# Patient Record
Sex: Male | Born: 1935 | Race: White | Hispanic: No | Marital: Married | Smoking: Never smoker
Health system: Southern US, Community
[De-identification: ages and names within clinical notes are randomized; demographics above are authoritative.]

## PROBLEM LIST (undated history)

## (undated) DIAGNOSIS — I712 Thoracic aortic aneurysm, without rupture: Secondary | ICD-10-CM

## (undated) DIAGNOSIS — E785 Hyperlipidemia, unspecified: Secondary | ICD-10-CM

## (undated) DIAGNOSIS — R001 Bradycardia, unspecified: Secondary | ICD-10-CM

## (undated) DIAGNOSIS — C259 Malignant neoplasm of pancreas, unspecified: Secondary | ICD-10-CM

## (undated) DIAGNOSIS — R180 Malignant ascites: Secondary | ICD-10-CM

## (undated) DIAGNOSIS — I10 Essential (primary) hypertension: Secondary | ICD-10-CM

## (undated) DIAGNOSIS — Z8489 Family history of other specified conditions: Secondary | ICD-10-CM

## (undated) DIAGNOSIS — I469 Cardiac arrest, cause unspecified: Secondary | ICD-10-CM

## (undated) DIAGNOSIS — C801 Malignant (primary) neoplasm, unspecified: Secondary | ICD-10-CM

## (undated) DIAGNOSIS — T82110A Breakdown (mechanical) of cardiac electrode, initial encounter: Secondary | ICD-10-CM

## (undated) DIAGNOSIS — Z950 Presence of cardiac pacemaker: Secondary | ICD-10-CM

## (undated) HISTORY — DX: Essential (primary) hypertension: I10

## (undated) HISTORY — DX: Malignant neoplasm of pancreas, unspecified: C25.9

## (undated) HISTORY — DX: Cardiac arrest, cause unspecified: I46.9

## (undated) HISTORY — DX: Malignant ascites: R18.0

## (undated) HISTORY — DX: Hyperlipidemia, unspecified: E78.5

## (undated) HISTORY — DX: Thoracic aortic aneurysm, without rupture: I71.2

## (undated) HISTORY — DX: Breakdown (mechanical) of cardiac electrode, initial encounter: T82.110A

## (undated) HISTORY — DX: Bradycardia, unspecified: R00.1

## (undated) MED FILL — Dexamethasone Sodium Phosphate Inj 100 MG/10ML: INTRAMUSCULAR | Qty: 1 | Status: AC

---

## 1997-03-04 HISTORY — PX: PERMANENT PACEMAKER INSERTION: SHX6023

## 2002-02-19 ENCOUNTER — Emergency Department (HOSPITAL_COMMUNITY): Admission: EM | Admit: 2002-02-19 | Discharge: 2002-02-19 | Payer: Self-pay | Admitting: *Deleted

## 2006-03-26 ENCOUNTER — Ambulatory Visit (HOSPITAL_COMMUNITY): Admission: RE | Admit: 2006-03-26 | Discharge: 2006-03-26 | Payer: Self-pay | Admitting: Neurology

## 2006-07-09 ENCOUNTER — Ambulatory Visit: Payer: Self-pay | Admitting: Gastroenterology

## 2006-07-22 ENCOUNTER — Ambulatory Visit: Payer: Self-pay | Admitting: Gastroenterology

## 2006-08-13 ENCOUNTER — Ambulatory Visit: Payer: Self-pay | Admitting: Gastroenterology

## 2006-09-10 ENCOUNTER — Ambulatory Visit (HOSPITAL_COMMUNITY): Admission: RE | Admit: 2006-09-10 | Discharge: 2006-09-10 | Payer: Self-pay | Admitting: Urology

## 2006-10-22 ENCOUNTER — Encounter: Admission: RE | Admit: 2006-10-22 | Discharge: 2007-01-20 | Payer: Self-pay | Admitting: *Deleted

## 2007-01-21 ENCOUNTER — Encounter: Admission: RE | Admit: 2007-01-21 | Discharge: 2007-02-18 | Payer: Self-pay | Admitting: *Deleted

## 2007-02-19 ENCOUNTER — Encounter: Admission: RE | Admit: 2007-02-19 | Discharge: 2007-03-15 | Payer: Self-pay | Admitting: *Deleted

## 2007-03-16 ENCOUNTER — Encounter: Admission: RE | Admit: 2007-03-16 | Discharge: 2007-06-14 | Payer: Self-pay | Admitting: *Deleted

## 2008-01-05 ENCOUNTER — Encounter
Admission: RE | Admit: 2008-01-05 | Discharge: 2008-01-29 | Payer: Self-pay | Admitting: Physical Medicine and Rehabilitation

## 2008-07-01 ENCOUNTER — Encounter: Admission: RE | Admit: 2008-07-01 | Discharge: 2008-08-09 | Payer: Self-pay | Admitting: Otolaryngology

## 2008-10-05 ENCOUNTER — Observation Stay (HOSPITAL_COMMUNITY): Admission: EM | Admit: 2008-10-05 | Discharge: 2008-10-06 | Payer: Self-pay | Admitting: Emergency Medicine

## 2008-11-22 ENCOUNTER — Ambulatory Visit (HOSPITAL_COMMUNITY): Admission: RE | Admit: 2008-11-22 | Discharge: 2008-11-22 | Payer: Self-pay | Admitting: Cardiology

## 2008-11-22 HISTORY — PX: PERMANENT PACEMAKER GENERATOR CHANGE: SHX6022

## 2008-12-11 ENCOUNTER — Ambulatory Visit: Payer: Self-pay | Admitting: Internal Medicine

## 2008-12-11 ENCOUNTER — Inpatient Hospital Stay (HOSPITAL_COMMUNITY): Admission: EM | Admit: 2008-12-11 | Discharge: 2008-12-12 | Payer: Self-pay | Admitting: Emergency Medicine

## 2010-05-01 ENCOUNTER — Ambulatory Visit (HOSPITAL_COMMUNITY): Admission: RE | Admit: 2010-05-01 | Discharge: 2010-05-01 | Payer: Self-pay | Admitting: Cardiovascular Disease

## 2010-09-13 ENCOUNTER — Encounter
Admission: RE | Admit: 2010-09-13 | Discharge: 2010-12-04 | Payer: Self-pay | Source: Home / Self Care | Attending: Otolaryngology | Admitting: Otolaryngology

## 2011-04-30 ENCOUNTER — Other Ambulatory Visit (HOSPITAL_COMMUNITY): Payer: Self-pay | Admitting: Cardiovascular Disease

## 2011-04-30 DIAGNOSIS — I712 Thoracic aortic aneurysm, without rupture: Secondary | ICD-10-CM

## 2011-04-30 NOTE — Discharge Summary (Signed)
Gary [name], Gary [name]                ACCOUNT NO.:  [id number]   MEDICAL RECORD NO.:  [id number]          PATIENT TYPE:  OBV   LOCATION:  6529                         FACILITY:  [facility]   PHYSICIAN:  Richard A. Alanda Amass, M.D.DATE OF BIRTH:  08/24/1932   DATE OF ADMISSION:  10/05/2008  DATE OF DISCHARGE:  10/06/2008                               DISCHARGE SUMMARY   DISCHARGE DIAGNOSES:  1. Near syncope, probably secondary to vasovagal from narcotic cough      syrup.  2. History of St. Jude pacemaker implant, device is at ERI per pacer      check on this admission.  3. Treated hypertension.  4. History of gentamicin-induced gait instability and hearing loss.   HOSPITAL COURSE:  The patient is a 77 year old male followed by Dr.  Alanda Amass and Dr. Arvilla Market.  He had a St. Jude pacemaker implanted in  the past for bradycardia.  He presented to [facility] ER on October 05, 2008,  with weakness, dizziness, and nausea.  The patient states he had a cold  for the previous few days and was given a cough syrup with hydrocodone.  He took his medicine and slept well initially, but then took 2 more  doses throughout the day.  After the second dose, he began to feel weak,  dizzy, and nauseated and vomited x2.  He presented to the emergency room  for further evaluation.  Chest x-ray revealed a large hiatal hernia  versus aneurysmal dilatation of the aorta and a CT of the chest was  obtained.  The patient does have a large hiatal hernia as well as  aneurysmal dilatation of the ascending aorta as well as 2 tiny nodules  in his lungs.  The patient was admitted to telemetry.  His pacemaker was  checked.  The device has reached ERI with an intrinsic underlying rhythm  of 80.  Dr. Clarene Duke feels the patient can be discharged on October 06, 2008.  He will need a followup with Dr. Alanda Amass.   LABORATORY DATA:  White count 13.8 on admission, hemoglobin 13.5,  hematocrit 40.4, platelets 134.  Sodium 138, potassium  3.9, BUN 11,  creatinine 0.98.  CK-MB and troponins are negative x3.  BNP is 182.  Urinalysis is unremarkable.  CT of the chest with contrast showed a  moderate-sized hiatal hernia and aneurysmal dilatation of the ascending  thoracic aorta.  There were 2 tiny nodules noted.  A followup CT is  recommended in 1 year.  A chest x-ray on October 05, 2008, showed a  retrocardiac opacity of undetermined etiology.  Telemetry shows sinus  rhythm 62.   DISCHARGE MEDICATIONS:  1. Aciphex 20 mg every other day.  2. Metoprolol 75 mg a day.  3. Norvasc 5 mg a day.  4. Enalapril 20 mg a day.  5. Simvastatin 20 mg at bedtime.  6. Aspirin 81 mg a day.   DISPOSITION:  The patient is discharged in stable condition and will  follow up with Dr. Alanda Amass.      Abelino Derrick, P.A.      Richard A. Alanda Amass, M.D.  Electronically Signed    LKK/MEDQ  D:  10/06/2008  T:  10/06/2008  Job:  [id number]   cc:   Gerlene Burdock A. Alanda Amass, M.D.  Donia Guiles, M.D.

## 2011-04-30 NOTE — H&P (Signed)
NAMEWILLIAMSON, [name] NO.:  [id number]   MEDICAL RECORD NO.:  [id number]          PATIENT TYPE:  INP   LOCATION:  5523                         FACILITY:  [facility]   PHYSICIAN:  Gardiner Barefoot, MD    DATE OF BIRTH:  09-04-1932   DATE OF ADMISSION:  12/10/2008  DATE OF DISCHARGE:                              HISTORY & PHYSICAL   PRIMARY CARE PHYSICIAN:  Donia Guiles, MD   CHIEF COMPLAINT:  Fall.   HISTORY OF PRESENT ILLNESS:  This is a 77 year old male with a history  of arrhythmia and a pacemaker and recent hospitalization for near  syncopal event, presents after a fall.  The patient reports he was  potting a plant and essentially does not remember the event, however,  was found had fallen back on his back few feet from where he was working  in the garden and does not remember the event.  The patient's wife does  report she had had a fall.  The patient does have some left hip pain.  The patient denies any chest pain, dizziness, shortness of breath or any  other symptoms associated prior to this.  No recent illnesses, no fever,  no recent nausea or vomiting.   PAST MEDICAL HISTORY:  1. Hypertension.  2. Arrhythmia.  3. GERD.  4. Hyperlipidemia.  5. History of hypoglycemia.   MEDICATIONS:  1. Aciphex 20 mg every other day.  2. Metoprolol 75 mg a day.  3. Norvasc 5 mg a day.  4. Enalapril 20 mg a day.  5. Simvastatin 20 mg nightly.  6. Aspirin 81 mg a day.   ALLERGIES:  GENTAMICIN and HYDROCODONE.   FAMILY HISTORY:  Noncontributory.   SOCIAL HISTORY:  Denies any history of alcohol, tobacco, or drugs.   REVIEW OF SYSTEMS:  Negative except as per the history of present  illness.   PHYSICAL EXAMINATION:  VITAL SIGNS:  Temperature is 97.6, pulse is 80,  respirations 20, blood pressure 139/83, and O2 sats 97%.  GENERAL:  The patient is awake, alert, and oriented x3, appears in no  acute distress.  CARDIOVASCULAR:  Regular rate and rhythm.  No murmurs,  rubs, or gallops.  LUNGS:  Clear to auscultation bilaterally.  ABDOMEN:  Soft, nontender, nondistended.  Positive bowel sounds.  No  hepatosplenomegaly.  EXTREMITIES:  No edema.  NEUROLOGIC:  Equal nonfocal.   LABORATORY DATA:  Negative UA, CMP with no significant abnormalities.  CBC with a white count of 11, 81% neutrophils, hemoglobin of 13.  CT  scan of the head and hip pending at this time.   ASSESSMENT AND PLAN:  1. Syncopal episode.  Etiologies could be stroke versus cardiac      etiology versus vasovagal.  CT of the head is pending at this time.      No significant concerns for stroke with the patient's recent      history might be secondary to cardiac origin.  Will discuss with      Cardiology.  2. Hypertension.  Will continue the patient's home medications.  3. Prophylaxis.  Will continue with DVT  prophylaxis.      Gardiner Barefoot, MD  Electronically Signed     RWC/MEDQ  D:  12/11/2008  T:  12/11/2008  Job:  [id number]

## 2011-05-03 NOTE — Assessment & Plan Note (Signed)
[facility]                           GASTROENTEROLOGY OFFICE NOTE   DONIE, LEMELIN                       MRN:          [id number]  DATE:08/13/2006                            DOB:          09-21-1932    CHIEF COMPLAINT:  Followup post EGD and colonoscopy.   HISTORY:  Mr. Gorrell is a very nice 77 year old male, known to Dr. Victorino Dike,  who had recently been referred for significant reflux symptoms.  He was scheduled for upper endoscopy and then also a colonoscopy for routine  screening. His upper endoscopy did show a prominent hiatal hernia, 6 cm with  an early stricture and he had some mild gastritis as well.  CLO-testing was  done and this was positive.  Colonoscopy did not reveal any polyps or other  mucosal lesions.  He had diverticulosis from the ascending colon to the  sigmoid colon mild, and internal and external hemorrhoids grade 2.  Patient  had been called in a Prevpac for his H. Pylori and has since completed that.  He is on AcipHex 20 mg daily with very good control of his reflux symptoms.   The patient has questions about H. Pylori and these were answered today.  He  also mentions that he is currently on another antibiotic and was diagnosed  with prostatitis about three days ago.  He said he was feeling very weak  over the past weekend and actually had what sounds like a vasovagal type of  event at home.  He had gone to Urgent Care, was diagnosed with prostatitis  and started on antibiotics and then saw Dr. Vonita Moss and is currently on  Cipro for 14 days, also had an antibiotic injection in his office.  He says  that he has been having some nocturnal sweats but overall is feeling better  over the past couple of days.  He was concerned because his stools were  loose during the time he was taking his Prevpac but these are back to  normal.  He has not noted any melena or hematochezia.   CURRENT MEDICATIONS:  Cipro 500 two times  daily, AcipHex 20 daily,  Simvastatin daily, Toprol 75 daily, and aspirin 81 mg daily.   PHYSICAL EXAMINATION:  VITAL SIGNS:  Weight is 170.6, blood pressure 122/70,  pulse is 64.  Not further examined today.  Discussion only.   IMPRESSION:  41. A 77 year old male with GERD, well controlled.  2. Status post treatment for H. Pylori gastritis.  3. Diverticulosis.  4. Active prostatitis on Cipro.   PLAN:  Continue AcipHex p.o. daily.  I do not feel that he needs to have any  further labs or H. Pylori antibody titer done at this time.  Will followup  with GI on a p.r.n. basis.                                   Amy Esterwood, PA-C  Ulyess Mort, MD   AE/MedQ  DD:  08/14/2006  DT:  08/15/2006  Job #:  [id number]

## 2011-05-07 ENCOUNTER — Encounter (HOSPITAL_COMMUNITY): Payer: Self-pay

## 2011-05-07 ENCOUNTER — Ambulatory Visit (HOSPITAL_COMMUNITY)
Admission: RE | Admit: 2011-05-07 | Discharge: 2011-05-07 | Disposition: A | Discharge status: Home / Self Care | Payer: Medicare Other | Source: Ambulatory Visit | Attending: Cardiovascular Disease | Admitting: Cardiovascular Disease

## 2011-05-07 DIAGNOSIS — I712 Thoracic aortic aneurysm, without rupture, unspecified: Secondary | ICD-10-CM | POA: Insufficient documentation

## 2011-05-07 HISTORY — DX: Essential (primary) hypertension: I10

## 2011-05-07 MED ORDER — IOHEXOL 300 MG/ML  SOLN: 100.0000 mL | Freq: Once | INTRAMUSCULAR | Status: AC | PRN

## 2011-05-16 HISTORY — PX: US ECHOCARDIOGRAPHY: HXRAD669

## 2011-08-02 ENCOUNTER — Encounter: Payer: Self-pay | Admitting: Gastroenterology

## 2011-09-16 LAB — URINALYSIS, ROUTINE W REFLEX MICROSCOPIC
Bilirubin Urine: NEGATIVE
Glucose, UA: NEGATIVE
Hgb urine dipstick: NEGATIVE
Ketones, ur: NEGATIVE
Nitrite: NEGATIVE
Protein, ur: NEGATIVE
Specific Gravity, Urine: 1.041 — ABNORMAL HIGH
Urobilinogen, UA: 0.2
pH: 8

## 2011-09-16 LAB — BASIC METABOLIC PANEL
BUN: 11
CO2: 25
Calcium: 8.4
Chloride: 106
Creatinine, Ser: 0.98
GFR calc Af Amer: >60
GFR calc non Af Amer: >60
Glucose, Bld: 113 — ABNORMAL HIGH
Potassium: 3.9
Sodium: 138

## 2011-09-16 LAB — LIPID PANEL
Cholesterol: 113
HDL: 36 — ABNORMAL LOW
LDL Cholesterol: 64
Total CHOL/HDL Ratio: 3.1
Triglycerides: 66
VLDL: 13

## 2011-09-16 LAB — DIFFERENTIAL
Basophils Absolute: 0
Basophils Relative: 0
Eosinophils Absolute: 0
Eosinophils Relative: 0
Lymphocytes Relative: 7 — ABNORMAL LOW
Lymphs Abs: 0.9
Monocytes Absolute: 0.7
Monocytes Relative: 5
Neutro Abs: 11.4 — ABNORMAL HIGH
Neutrophils Relative %: 88 — ABNORMAL HIGH

## 2011-09-16 LAB — CBC
HCT: 40.4
Hemoglobin: 13.5
MCHC: 33.5
MCV: 92.5
Platelets: 134 — ABNORMAL LOW
RBC: 4.36
RDW: 13.8
WBC: 13 — ABNORMAL HIGH

## 2011-09-16 LAB — POCT CARDIAC MARKERS
CKMB, poc: <1 — ABNORMAL LOW
CKMB, poc: <1 — ABNORMAL LOW
Myoglobin, poc: 57.4
Myoglobin, poc: 63.5
Troponin i, poc: <0.05
Troponin i, poc: <0.05

## 2011-09-16 LAB — TROPONIN I: Troponin I: <0.01

## 2011-09-16 LAB — CARDIAC PANEL(CRET KIN+CKTOT+MB+TROPI)
CK, MB: 1.3
Relative Index: INVALID
Total CK: 67
Troponin I: <0.01

## 2011-09-16 LAB — B-NATRIURETIC PEPTIDE (CONVERTED LAB): Pro B Natriuretic peptide (BNP): 182 — ABNORMAL HIGH

## 2011-09-16 LAB — TSH: TSH: 0.414

## 2011-09-16 LAB — CK TOTAL AND CKMB (NOT AT ARMC)
CK, MB: 1
Relative Index: INVALID
Total CK: 52

## 2011-09-16 LAB — MAGNESIUM: Magnesium: 1.8

## 2011-09-19 LAB — URINALYSIS, ROUTINE W REFLEX MICROSCOPIC
Bilirubin Urine: NEGATIVE
Glucose, UA: NEGATIVE mg/dL
Hgb urine dipstick: NEGATIVE
Ketones, ur: NEGATIVE mg/dL
Nitrite: NEGATIVE
Protein, ur: NEGATIVE mg/dL
Specific Gravity, Urine: 1.02 (ref 1.005–1.030)
Urobilinogen, UA: 1 mg/dL (ref 0.0–1.0)
pH: 7 (ref 5.0–8.0)

## 2011-09-19 LAB — CBC
HCT: 37.9 % — ABNORMAL LOW (ref 39.0–52.0)
HCT: 40.6 % (ref 39.0–52.0)
Hemoglobin: 12.8 g/dL — ABNORMAL LOW (ref 13.0–17.0)
Hemoglobin: 13.6 g/dL (ref 13.0–17.0)
MCHC: 33.4 g/dL (ref 30.0–36.0)
MCHC: 33.6 g/dL (ref 30.0–36.0)
MCV: 90.7 fL (ref 78.0–100.0)
MCV: 91.1 fL (ref 78.0–100.0)
Platelets: 145 10*3/uL — ABNORMAL LOW (ref 150–400)
Platelets: 165 10*3/uL (ref 150–400)
RBC: 4.18 MIL/uL — ABNORMAL LOW (ref 4.22–5.81)
RBC: 4.46 MIL/uL (ref 4.22–5.81)
RDW: 13.5 % (ref 11.5–15.5)
RDW: 13.7 % (ref 11.5–15.5)
WBC: 11 10*3/uL — ABNORMAL HIGH (ref 4.0–10.5)
WBC: 8 10*3/uL (ref 4.0–10.5)

## 2011-09-19 LAB — CK TOTAL AND CKMB (NOT AT ARMC)
CK, MB: 0.8 ng/mL (ref 0.3–4.0)
CK, MB: 0.9 ng/mL (ref 0.3–4.0)
CK, MB: 1 ng/mL (ref 0.3–4.0)
CK, MB: 1.1 ng/mL (ref 0.3–4.0)
Relative Index: INVALID (ref 0.0–2.5)
Relative Index: INVALID (ref 0.0–2.5)
Relative Index: INVALID (ref 0.0–2.5)
Relative Index: INVALID (ref 0.0–2.5)
Total CK: 59 U/L (ref 7–232)
Total CK: 66 U/L (ref 7–232)
Total CK: 68 U/L (ref 7–232)
Total CK: 70 U/L (ref 7–232)

## 2011-09-19 LAB — COMPREHENSIVE METABOLIC PANEL
ALT: 17 U/L (ref 0–53)
ALT: 20 U/L (ref 0–53)
AST: 16 U/L (ref 0–37)
AST: 23 U/L (ref 0–37)
Albumin: 3.1 g/dL — ABNORMAL LOW (ref 3.5–5.2)
Albumin: 3.6 g/dL (ref 3.5–5.2)
Alkaline Phosphatase: 57 U/L (ref 39–117)
Alkaline Phosphatase: 65 U/L (ref 39–117)
BUN: 16 mg/dL (ref 6–23)
BUN: 24 mg/dL — ABNORMAL HIGH (ref 6–23)
CO2: 23 mEq/L (ref 19–32)
CO2: 28 mEq/L (ref 19–32)
Calcium: 8.7 mg/dL (ref 8.4–10.5)
Calcium: 8.8 mg/dL (ref 8.4–10.5)
Chloride: 104 mEq/L (ref 96–112)
Chloride: 105 mEq/L (ref 96–112)
Creatinine, Ser: 1.05 mg/dL (ref 0.4–1.5)
Creatinine, Ser: 1.21 mg/dL (ref 0.4–1.5)
GFR calc Af Amer: >60 mL/min (ref >60)
GFR calc Af Amer: >60 mL/min (ref >60)
GFR calc non Af Amer: 59 mL/min — ABNORMAL LOW (ref >60)
GFR calc non Af Amer: >60 mL/min (ref >60)
Glucose, Bld: 92 mg/dL (ref 70–99)
Glucose, Bld: 97 mg/dL (ref 70–99)
Potassium: 3.9 mEq/L (ref 3.5–5.1)
Potassium: 4 mEq/L (ref 3.5–5.1)
Sodium: 138 mEq/L (ref 135–145)
Sodium: 139 mEq/L (ref 135–145)
Total Bilirubin: 0.8 mg/dL (ref 0.3–1.2)
Total Bilirubin: 1.4 mg/dL — ABNORMAL HIGH (ref 0.3–1.2)
Total Protein: 6.4 g/dL (ref 6.0–8.3)
Total Protein: 6.8 g/dL (ref 6.0–8.3)

## 2011-09-19 LAB — DIFFERENTIAL
Basophils Absolute: 0 10*3/uL (ref 0.0–0.1)
Basophils Relative: 0 % (ref 0–1)
Eosinophils Absolute: 0 10*3/uL (ref 0.0–0.7)
Eosinophils Relative: 0 % (ref 0–5)
Lymphocytes Relative: 11 % — ABNORMAL LOW (ref 12–46)
Lymphs Abs: 1.2 10*3/uL (ref 0.7–4.0)
Monocytes Absolute: 0.8 10*3/uL (ref 0.1–1.0)
Monocytes Relative: 8 % (ref 3–12)
Neutro Abs: 8.9 10*3/uL — ABNORMAL HIGH (ref 1.7–7.7)
Neutrophils Relative %: 81 % — ABNORMAL HIGH (ref 43–77)

## 2011-09-19 LAB — POCT I-STAT, CHEM 8
BUN: 25 mg/dL — ABNORMAL HIGH (ref 6–23)
Calcium, Ion: 1.11 mmol/L — ABNORMAL LOW (ref 1.12–1.32)
Chloride: 107 meq/L (ref 96–112)
Creatinine, Ser: 1.4 mg/dL (ref 0.4–1.5)
Glucose, Bld: 108 mg/dL — ABNORMAL HIGH (ref 70–99)
HCT: 42 % (ref 39.0–52.0)
Hemoglobin: 14.3 g/dL (ref 13.0–17.0)
Potassium: 4 meq/L (ref 3.5–5.1)
Sodium: 140 meq/L (ref 135–145)
TCO2: 23 mmol/L (ref 0–100)

## 2011-09-19 LAB — TROPONIN I
Troponin I: 0.01 ng/mL (ref 0.00–0.06)
Troponin I: <0.01 ng/mL (ref 0.00–0.06)
Troponin I: <0.01 ng/mL (ref 0.00–0.06)
Troponin I: <0.01 ng/mL (ref 0.00–0.06)

## 2012-02-06 DIAGNOSIS — E7800 Pure hypercholesterolemia, unspecified: Secondary | ICD-10-CM | POA: Diagnosis not present

## 2012-02-06 DIAGNOSIS — Z0000 Encounter for general adult medical examination without abnormal findings: Secondary | ICD-10-CM | POA: Diagnosis not present

## 2012-02-06 DIAGNOSIS — I10 Essential (primary) hypertension: Secondary | ICD-10-CM | POA: Diagnosis not present

## 2012-03-02 DIAGNOSIS — N138 Other obstructive and reflux uropathy: Secondary | ICD-10-CM | POA: Diagnosis not present

## 2012-03-02 DIAGNOSIS — R3915 Urgency of urination: Secondary | ICD-10-CM | POA: Diagnosis not present

## 2012-03-02 DIAGNOSIS — N400 Enlarged prostate without lower urinary tract symptoms: Secondary | ICD-10-CM | POA: Diagnosis not present

## 2012-03-02 DIAGNOSIS — N401 Enlarged prostate with lower urinary tract symptoms: Secondary | ICD-10-CM | POA: Diagnosis not present

## 2012-10-01 DIAGNOSIS — Z23 Encounter for immunization: Secondary | ICD-10-CM | POA: Diagnosis not present

## 2012-10-23 ENCOUNTER — Other Ambulatory Visit (HOSPITAL_COMMUNITY): Payer: Self-pay | Admitting: Cardiovascular Disease

## 2012-10-23 DIAGNOSIS — I10 Essential (primary) hypertension: Secondary | ICD-10-CM | POA: Diagnosis not present

## 2012-10-23 DIAGNOSIS — E782 Mixed hyperlipidemia: Secondary | ICD-10-CM | POA: Diagnosis not present

## 2012-10-23 DIAGNOSIS — I712 Thoracic aortic aneurysm, without rupture: Secondary | ICD-10-CM

## 2012-10-23 DIAGNOSIS — I714 Abdominal aortic aneurysm, without rupture: Secondary | ICD-10-CM

## 2012-10-23 DIAGNOSIS — R079 Chest pain, unspecified: Secondary | ICD-10-CM | POA: Diagnosis not present

## 2012-10-26 DIAGNOSIS — R5383 Other fatigue: Secondary | ICD-10-CM | POA: Diagnosis not present

## 2012-10-26 DIAGNOSIS — E782 Mixed hyperlipidemia: Secondary | ICD-10-CM | POA: Diagnosis not present

## 2012-10-26 DIAGNOSIS — Z79899 Other long term (current) drug therapy: Secondary | ICD-10-CM | POA: Diagnosis not present

## 2012-10-28 DIAGNOSIS — Z961 Presence of intraocular lens: Secondary | ICD-10-CM | POA: Diagnosis not present

## 2012-10-28 DIAGNOSIS — H5210 Myopia, unspecified eye: Secondary | ICD-10-CM | POA: Diagnosis not present

## 2012-10-29 ENCOUNTER — Ambulatory Visit (HOSPITAL_COMMUNITY)
Admission: RE | Admit: 2012-10-29 | Discharge: 2012-10-29 | Disposition: A | Discharge status: Home / Self Care | Payer: Medicare Other | Source: Ambulatory Visit | Attending: Cardiovascular Disease | Admitting: Cardiovascular Disease

## 2012-10-29 ENCOUNTER — Encounter (HOSPITAL_COMMUNITY): Payer: Self-pay

## 2012-10-29 DIAGNOSIS — K449 Diaphragmatic hernia without obstruction or gangrene: Secondary | ICD-10-CM | POA: Diagnosis not present

## 2012-10-29 DIAGNOSIS — I712 Thoracic aortic aneurysm, without rupture, unspecified: Secondary | ICD-10-CM | POA: Insufficient documentation

## 2012-10-29 DIAGNOSIS — K8020 Calculus of gallbladder without cholecystitis without obstruction: Secondary | ICD-10-CM | POA: Insufficient documentation

## 2012-10-29 HISTORY — DX: Presence of cardiac pacemaker: Z95.0

## 2012-10-29 MED ORDER — IOHEXOL 300 MG/ML  SOLN
100.0000 mL | Freq: Once | INTRAMUSCULAR | Status: AC | PRN
  Administered 2012-10-29: 100 mL via INTRAVENOUS

## 2012-12-24 ENCOUNTER — Other Ambulatory Visit (HOSPITAL_COMMUNITY): Payer: Self-pay | Admitting: Cardiovascular Disease

## 2012-12-24 DIAGNOSIS — I998 Other disorder of circulatory system: Secondary | ICD-10-CM

## 2012-12-24 DIAGNOSIS — I672 Cerebral atherosclerosis: Secondary | ICD-10-CM

## 2012-12-24 DIAGNOSIS — R55 Syncope and collapse: Secondary | ICD-10-CM

## 2012-12-24 DIAGNOSIS — R0989 Other specified symptoms and signs involving the circulatory and respiratory systems: Secondary | ICD-10-CM

## 2013-01-01 ENCOUNTER — Ambulatory Visit (HOSPITAL_COMMUNITY)
Admission: RE | Admit: 2013-01-01 | Discharge: 2013-01-01 | Disposition: A | Discharge status: Home / Self Care | Payer: Medicare Other | Source: Ambulatory Visit | Attending: Cardiovascular Disease | Admitting: Cardiovascular Disease

## 2013-01-01 DIAGNOSIS — I10 Essential (primary) hypertension: Secondary | ICD-10-CM | POA: Insufficient documentation

## 2013-01-01 DIAGNOSIS — R42 Dizziness and giddiness: Secondary | ICD-10-CM | POA: Insufficient documentation

## 2013-01-01 DIAGNOSIS — I998 Other disorder of circulatory system: Secondary | ICD-10-CM

## 2013-01-01 DIAGNOSIS — I2510 Atherosclerotic heart disease of native coronary artery without angina pectoris: Secondary | ICD-10-CM | POA: Diagnosis not present

## 2013-01-01 DIAGNOSIS — Z950 Presence of cardiac pacemaker: Secondary | ICD-10-CM | POA: Diagnosis not present

## 2013-01-01 MED ORDER — TECHNETIUM TC 99M SESTAMIBI GENERIC - CARDIOLITE
11.0000 | Freq: Once | INTRAVENOUS | Status: AC | PRN
  Administered 2013-01-01: 11 via INTRAVENOUS

## 2013-01-01 MED ORDER — REGADENOSON 0.4 MG/5ML IV SOLN
0.4000 mg | Freq: Once | INTRAVENOUS | Status: AC
  Administered 2013-01-01: 0.4 mg via INTRAVENOUS

## 2013-01-01 MED ORDER — TECHNETIUM TC 99M SESTAMIBI GENERIC - CARDIOLITE
30.6000 | Freq: Once | INTRAVENOUS | Status: AC | PRN
  Administered 2013-01-01: 31 via INTRAVENOUS

## 2013-01-01 MED ORDER — AMINOPHYLLINE 25 MG/ML IV SOLN
75.0000 mg | Freq: Once | INTRAVENOUS | Status: AC
  Administered 2013-01-01: 75 mg via INTRAVENOUS

## 2013-01-01 NOTE — Procedures (Addendum)
[name] [address] [place] [address] [phone]  Cardiology Nuclear Med Study  Gary [name] is a 79 y.o. male     MRN : [id number]     DOB: November 16, 1932  Procedure Date: 01/01/2013  Nuclear Med Background Indication for Stress Test:  Evaluation for Ischemia History:  2009 Nuclear test- EF 77%, No ischemia, Pacemaker Cardiac Risk Factors: Hypertension and Lipids  Symptoms:  Dizziness   Nuclear Pre-Procedure Caffeine/Decaff Intake:  None NPO After: 9:00am   IV Site: R Antecubital  IV 0.9% NS with Angio Cath:  22g  Chest Size (in):  48 IV Started by: Bonnita Levan, RN  Height: 5\' 7"  (1.702 m)  Cup Size: n/a  BMI:  Body mass index is 27.41 kg/(m^2). Weight:  175 lb (79.379 kg)   Tech Comments:  N/A    Nuclear Med Study 1 or 2 day study: 1 day  Stress Test Type:  Stress  Order Authorizing Provider:  Susa Griffins, MD   Resting Radionuclide: Technetium 36m Sestamibi  Resting Radionuclide Dose: 11.0 mCi   Stress Radionuclide:  Technetium 67m Sestamibi  Stress Radionuclide Dose: 30.6 mCi           Stress Protocol Rest HR: 59 Stress HR: 89  Rest BP:145/91 Stress BP: 158/86  Exercise Time (min): n/a METS: n/a          Dose of Adenosine (mg):  n/a Dose of Lexiscan: 0.4 mg  Dose of Atropine (mg): n/a Dose of Dobutamine: n/a mcg/kg/min (at max HR)  Stress Test Technologist: [name], CCT Nuclear Technologist: Gonzella Lex, CNMT   Rest Procedure:  Myocardial perfusion imaging was performed at rest 45 minutes following the intravenous administration of Technetium 59m Sestamibi. Stress Procedure:  The patient received IV Lexiscan 0.4 mg over 15-seconds.  Technetium 64m Sestamibi injected at 30-seconds. Due to patient's shortness of breath and flushing, he was given IV aminophylline 75 mg. Symptoms were resolved. There were no significant changes with Lexiscan.  Quantitative spect images were obtained after  a 45 minute delay.  Transient Ischemic Dilatation (Normal <1.22):  0.95 Lung/Heart Ratio (Normal <0.45):  0.29 QGS EDV:  50 ml QGS ESV:  11 ml LV Ejection Fraction: 78%  Signed by: Pamel A. Vear Clock, CNMT  PHYSICIAN INTERPRETATION:  Rest ECG: NSR - Normal EKG  Stress ECG: No significant change from baseline ECG  QPS Raw Data Images:  Patient motion noted. There is interference from nuclear activity from structures below the diaphragm. This does not affect the ability to read the study. Mild diaphragmatic attenuation; normal left ventricular size. Stress Images:  Normal homogeneous uptake in all areas of the myocardium. Rest Images:  Normal homogeneous uptake in all areas of the myocardium. Subtraction (SDS):  There is no evidence of scar or ischemia.  Impression Exercise Capacity:  Lexiscan with no exercise. BP Response:  Normal blood pressure response. Clinical Symptoms:  No significant symptoms noted. ECG Impression:  No significant ECG changes with Lexiscan. LV Wall Motion:  NL LV Function; NL Wall Motion Overall Impression:  Normal stress nuclear study. Low risk stress nuclear study.  Comparison with Prior Nuclear Study: No significant change from previous study.   Marykay Lex, MD  01/01/2013 3:57 PM

## 2013-01-14 DIAGNOSIS — R0989 Other specified symptoms and signs involving the circulatory and respiratory systems: Secondary | ICD-10-CM | POA: Diagnosis not present

## 2013-01-15 ENCOUNTER — Ambulatory Visit (HOSPITAL_COMMUNITY)
Admission: RE | Admit: 2013-01-15 | Discharge: 2013-01-15 | Disposition: A | Discharge status: Home / Self Care | Payer: Medicare Other | Source: Ambulatory Visit | Attending: Cardiovascular Disease | Admitting: Cardiovascular Disease

## 2013-01-15 DIAGNOSIS — R55 Syncope and collapse: Secondary | ICD-10-CM | POA: Insufficient documentation

## 2013-01-15 DIAGNOSIS — I672 Cerebral atherosclerosis: Secondary | ICD-10-CM

## 2013-01-15 DIAGNOSIS — R0989 Other specified symptoms and signs involving the circulatory and respiratory systems: Secondary | ICD-10-CM | POA: Diagnosis not present

## 2013-01-15 NOTE — Progress Notes (Signed)
Carotid Duplex Completed. Gary [name] D  

## 2013-03-01 DIAGNOSIS — Z0000 Encounter for general adult medical examination without abnormal findings: Secondary | ICD-10-CM | POA: Diagnosis not present

## 2013-03-01 DIAGNOSIS — Z23 Encounter for immunization: Secondary | ICD-10-CM | POA: Diagnosis not present

## 2013-03-01 DIAGNOSIS — Z1331 Encounter for screening for depression: Secondary | ICD-10-CM | POA: Diagnosis not present

## 2013-03-01 DIAGNOSIS — I10 Essential (primary) hypertension: Secondary | ICD-10-CM | POA: Diagnosis not present

## 2013-03-01 DIAGNOSIS — Z950 Presence of cardiac pacemaker: Secondary | ICD-10-CM | POA: Diagnosis not present

## 2013-03-01 DIAGNOSIS — E7800 Pure hypercholesterolemia, unspecified: Secondary | ICD-10-CM | POA: Diagnosis not present

## 2013-03-02 DIAGNOSIS — N138 Other obstructive and reflux uropathy: Secondary | ICD-10-CM | POA: Diagnosis not present

## 2013-03-02 DIAGNOSIS — N400 Enlarged prostate without lower urinary tract symptoms: Secondary | ICD-10-CM | POA: Diagnosis not present

## 2013-03-03 DIAGNOSIS — H903 Sensorineural hearing loss, bilateral: Secondary | ICD-10-CM | POA: Diagnosis not present

## 2013-03-03 DIAGNOSIS — H6120 Impacted cerumen, unspecified ear: Secondary | ICD-10-CM | POA: Diagnosis not present

## 2013-03-03 DIAGNOSIS — H8120 Vestibular neuronitis, unspecified ear: Secondary | ICD-10-CM | POA: Diagnosis not present

## 2013-03-26 ENCOUNTER — Ambulatory Visit: Payer: Medicare Other | Attending: Otolaryngology | Admitting: Rehabilitative and Restorative Service Providers"

## 2013-03-26 DIAGNOSIS — R269 Unspecified abnormalities of gait and mobility: Secondary | ICD-10-CM | POA: Diagnosis not present

## 2013-03-26 DIAGNOSIS — IMO0001 Reserved for inherently not codable concepts without codable children: Secondary | ICD-10-CM | POA: Diagnosis not present

## 2013-04-07 ENCOUNTER — Ambulatory Visit: Payer: Medicare Other | Admitting: Rehabilitative and Restorative Service Providers"

## 2013-04-08 ENCOUNTER — Ambulatory Visit: Payer: Medicare Other | Admitting: Rehabilitative and Restorative Service Providers"

## 2013-04-08 DIAGNOSIS — IMO0001 Reserved for inherently not codable concepts without codable children: Secondary | ICD-10-CM | POA: Diagnosis not present

## 2013-04-08 DIAGNOSIS — R269 Unspecified abnormalities of gait and mobility: Secondary | ICD-10-CM | POA: Diagnosis not present

## 2013-04-14 ENCOUNTER — Ambulatory Visit: Payer: Medicare Other | Admitting: Rehabilitative and Restorative Service Providers"

## 2013-04-14 DIAGNOSIS — R269 Unspecified abnormalities of gait and mobility: Secondary | ICD-10-CM | POA: Diagnosis not present

## 2013-04-14 DIAGNOSIS — IMO0001 Reserved for inherently not codable concepts without codable children: Secondary | ICD-10-CM | POA: Diagnosis not present

## 2013-04-17 ENCOUNTER — Encounter: Payer: Self-pay | Admitting: Cardiovascular Disease

## 2013-06-01 DIAGNOSIS — Z45018 Encounter for adjustment and management of other part of cardiac pacemaker: Secondary | ICD-10-CM | POA: Diagnosis not present

## 2013-06-01 DIAGNOSIS — I10 Essential (primary) hypertension: Secondary | ICD-10-CM | POA: Diagnosis not present

## 2013-06-01 DIAGNOSIS — R269 Unspecified abnormalities of gait and mobility: Secondary | ICD-10-CM | POA: Diagnosis not present

## 2013-06-01 DIAGNOSIS — E782 Mixed hyperlipidemia: Secondary | ICD-10-CM | POA: Diagnosis not present

## 2013-06-02 ENCOUNTER — Telehealth: Payer: Self-pay | Admitting: Cardiovascular Disease

## 2013-06-02 NOTE — Telephone Encounter (Signed)
Was here yesterday to see Dr Oswaldo Conroy he orders lab work-he didn't yesterday-did he just forget to do it?

## 2013-06-02 NOTE — Telephone Encounter (Signed)
Message forwarded to [facility]. Berlinda Last, LPN.  Chart# [id number] on desk.

## 2013-06-02 NOTE — Telephone Encounter (Signed)
Pt. Informed that Dr. Alanda Amass didn't want any labs this visit . Pt stated understanding of information

## 2013-06-03 ENCOUNTER — Encounter: Payer: Self-pay | Admitting: Cardiovascular Disease

## 2013-06-03 NOTE — Telephone Encounter (Signed)
I told pt. Dr. Alanda Amass didn't want to order any labs with this visit

## 2013-06-14 NOTE — Telephone Encounter (Signed)
See notes

## 2013-07-16 DIAGNOSIS — R21 Rash and other nonspecific skin eruption: Secondary | ICD-10-CM | POA: Diagnosis not present

## 2013-07-24 ENCOUNTER — Other Ambulatory Visit: Payer: Self-pay | Admitting: Cardiology

## 2013-07-26 NOTE — Telephone Encounter (Addendum)
Rx was sent to pharmacy electronically via [employer].  

## 2013-08-11 NOTE — Telephone Encounter (Signed)
Called pt and he didn't know about any calls from [place] to him or him to [place]

## 2013-08-30 ENCOUNTER — Other Ambulatory Visit: Payer: Self-pay | Admitting: *Deleted

## 2013-08-30 MED ORDER — AMLODIPINE BESYLATE 5 MG PO TABS: 5.0000 mg | ORAL_TABLET | Freq: Every day | ORAL | Status: DC

## 2013-08-30 NOTE — Telephone Encounter (Signed)
Rx was sent to pharmacy electronically via [employer] 

## 2013-09-13 DIAGNOSIS — E782 Mixed hyperlipidemia: Secondary | ICD-10-CM | POA: Diagnosis not present

## 2013-09-13 DIAGNOSIS — I10 Essential (primary) hypertension: Secondary | ICD-10-CM | POA: Diagnosis not present

## 2013-09-13 LAB — PACEMAKER DEVICE OBSERVATION

## 2013-09-14 ENCOUNTER — Encounter: Payer: Self-pay | Admitting: Cardiovascular Disease

## 2013-09-16 ENCOUNTER — Other Ambulatory Visit: Payer: Self-pay | Admitting: Cardiovascular Disease

## 2013-09-16 DIAGNOSIS — R6889 Other general symptoms and signs: Secondary | ICD-10-CM | POA: Diagnosis not present

## 2013-09-16 DIAGNOSIS — R5383 Other fatigue: Secondary | ICD-10-CM | POA: Diagnosis not present

## 2013-09-16 DIAGNOSIS — E782 Mixed hyperlipidemia: Secondary | ICD-10-CM | POA: Diagnosis not present

## 2013-09-16 DIAGNOSIS — N429 Disorder of prostate, unspecified: Secondary | ICD-10-CM | POA: Diagnosis not present

## 2013-09-16 DIAGNOSIS — R5381 Other malaise: Secondary | ICD-10-CM | POA: Diagnosis not present

## 2013-09-16 LAB — COMPREHENSIVE METABOLIC PANEL
ALT: 14 U/L (ref 0–53)
AST: 19 U/L (ref 0–37)
Albumin: 4 g/dL (ref 3.5–5.2)
Alkaline Phosphatase: 64 U/L (ref 39–117)
BUN: 16 mg/dL (ref 6–23)
CO2: 28 mEq/L (ref 19–32)
Calcium: 9.1 mg/dL (ref 8.4–10.5)
Chloride: 106 mEq/L (ref 96–112)
Creat: 0.9 mg/dL (ref 0.50–1.35)
Glucose, Bld: 88 mg/dL (ref 70–99)
Potassium: 4.3 mEq/L (ref 3.5–5.3)
Sodium: 141 mEq/L (ref 135–145)
Total Bilirubin: 1 mg/dL (ref 0.3–1.2)
Total Protein: 6.9 g/dL (ref 6.0–8.3)

## 2013-09-16 LAB — CBC WITH DIFFERENTIAL/PLATELET
Basophils Absolute: 0 10*3/uL (ref 0.0–0.1)
Basophils Relative: 0 % (ref 0–1)
Eosinophils Absolute: 0.1 10*3/uL (ref 0.0–0.7)
Eosinophils Relative: 1 % (ref 0–5)
HCT: 41.5 % (ref 39.0–52.0)
Hemoglobin: 14.1 g/dL (ref 13.0–17.0)
Lymphocytes Relative: 25 % (ref 12–46)
Lymphs Abs: 1.4 10*3/uL (ref 0.7–4.0)
MCH: 30 pg (ref 26.0–34.0)
MCHC: 34 g/dL (ref 30.0–36.0)
MCV: 88.3 fL (ref 78.0–100.0)
Monocytes Absolute: 0.5 10*3/uL (ref 0.1–1.0)
Monocytes Relative: 10 % (ref 3–12)
Neutro Abs: 3.4 10*3/uL (ref 1.7–7.7)
Neutrophils Relative %: 64 % (ref 43–77)
Platelets: 187 10*3/uL (ref 150–400)
RBC: 4.7 MIL/uL (ref 4.22–5.81)
RDW: 14.7 % (ref 11.5–15.5)
WBC: 5.4 10*3/uL (ref 4.0–10.5)

## 2013-09-16 LAB — PACEMAKER DEVICE OBSERVATION
AL AMPLITUDE: 2.6 mv
ATRIAL PACING PM: 0
BAMS-0001: 150 {beats}/min
BAMS-0003: 70 {beats}/min
BATTERY VOLTAGE: 2.81 V
DEVICE MODEL PM: 2228740
RV LEAD AMPLITUDE: 2.7 mv
VENTRICULAR PACING PM: 0

## 2013-09-16 LAB — LIPID PANEL
Cholesterol: 138 mg/dL (ref 0–200)
HDL: 49 mg/dL (ref >39)
LDL Cholesterol: 70 mg/dL (ref 0–99)
Total CHOL/HDL Ratio: 2.8 Ratio
Triglycerides: 93 mg/dL (ref <150)
VLDL: 19 mg/dL (ref 0–40)

## 2013-09-17 LAB — PSA: PSA: 1.54 ng/mL (ref <=4.00)

## 2013-09-17 LAB — TSH: TSH: 0.994 u[IU]/mL (ref 0.350–4.500)

## 2013-09-27 NOTE — Progress Notes (Signed)
The information re: atrial lead noise and the subsequent plan was inaccurate (wrong patient) Thurmon Fair, MD, [facility] [phone] office [phone] pager

## 2013-10-06 DIAGNOSIS — Z23 Encounter for immunization: Secondary | ICD-10-CM | POA: Diagnosis not present

## 2013-10-19 ENCOUNTER — Telehealth: Payer: Self-pay | Admitting: Internal Medicine

## 2013-10-19 NOTE — Telephone Encounter (Signed)
Pt has Jan recall with Dr c/mt

## 2013-11-01 DIAGNOSIS — Z961 Presence of intraocular lens: Secondary | ICD-10-CM | POA: Diagnosis not present

## 2013-11-01 DIAGNOSIS — H52209 Unspecified astigmatism, unspecified eye: Secondary | ICD-10-CM | POA: Diagnosis not present

## 2013-11-03 ENCOUNTER — Other Ambulatory Visit: Payer: Self-pay | Admitting: *Deleted

## 2013-11-03 ENCOUNTER — Telehealth: Payer: Self-pay | Admitting: Cardiovascular Disease

## 2013-11-03 MED ORDER — METOPROLOL SUCCINATE ER 50 MG PO TB24: 50.0000 mg | ORAL_TABLET | Freq: Every day | ORAL | Status: DC

## 2013-11-03 MED ORDER — SIMVASTATIN 20 MG PO TABS: 20.0000 mg | ORAL_TABLET | Freq: Every day | ORAL | Status: DC

## 2013-11-03 NOTE — Telephone Encounter (Signed)
Returned call.  Left message asking that hardy copy request be faxed to [phone] for refills.  

## 2013-11-03 NOTE — Telephone Encounter (Signed)
Need refill on Metoprolol 50 mg ER #30,Simvastatin 20 mg #30

## 2013-12-06 ENCOUNTER — Telehealth: Payer: Self-pay | Admitting: Cardiovascular Disease

## 2013-12-06 NOTE — Telephone Encounter (Signed)
Wants to know when he had his last pacemakerr?

## 2013-12-07 NOTE — Telephone Encounter (Signed)
Returned call and pt verified x 2.  Pt stated he had a pacemaker put in about 5-6 years ago.  Pt stated he doesn't have the card with the date of his last pacemaker.  Stated it has 1998 on it.  Pt informed last generator change was in 2009 per last OV note.  Pt looked at card again and found information with 8.12.09 date and serial number on it.  Pt stated he did have the information after all.  No further action taken.

## 2014-01-10 ENCOUNTER — Encounter: Payer: Self-pay | Admitting: *Deleted

## 2014-01-13 ENCOUNTER — Ambulatory Visit: Payer: Medicare Other | Admitting: Cardiovascular Disease

## 2014-01-13 ENCOUNTER — Encounter: Payer: Self-pay | Admitting: Cardiovascular Disease

## 2014-01-13 ENCOUNTER — Ambulatory Visit (INDEPENDENT_AMBULATORY_CARE_PROVIDER_SITE_OTHER): Payer: Medicare Other | Admitting: Cardiovascular Disease

## 2014-01-13 VITALS — BP 122/76 | HR 66 | Resp 16

## 2014-01-13 DIAGNOSIS — I712 Thoracic aortic aneurysm, without rupture, unspecified: Secondary | ICD-10-CM

## 2014-01-13 DIAGNOSIS — R001 Bradycardia, unspecified: Secondary | ICD-10-CM

## 2014-01-13 DIAGNOSIS — I714 Abdominal aortic aneurysm, without rupture, unspecified: Secondary | ICD-10-CM

## 2014-01-13 DIAGNOSIS — Z950 Presence of cardiac pacemaker: Secondary | ICD-10-CM

## 2014-01-13 DIAGNOSIS — Z79899 Other long term (current) drug therapy: Secondary | ICD-10-CM

## 2014-01-13 DIAGNOSIS — I498 Other specified cardiac arrhythmias: Secondary | ICD-10-CM | POA: Diagnosis not present

## 2014-01-13 DIAGNOSIS — E785 Hyperlipidemia, unspecified: Secondary | ICD-10-CM

## 2014-01-13 DIAGNOSIS — I10 Essential (primary) hypertension: Secondary | ICD-10-CM

## 2014-01-13 DIAGNOSIS — I469 Cardiac arrest, cause unspecified: Secondary | ICD-10-CM

## 2014-01-13 DIAGNOSIS — T82110A Breakdown (mechanical) of cardiac electrode, initial encounter: Secondary | ICD-10-CM

## 2014-01-13 DIAGNOSIS — I7121 Aneurysm of the ascending aorta, without rupture: Secondary | ICD-10-CM

## 2014-01-13 HISTORY — DX: Breakdown (mechanical) of cardiac electrode, initial encounter: T82.110A

## 2014-01-13 HISTORY — DX: Presence of cardiac pacemaker: Z95.0

## 2014-01-13 HISTORY — DX: Aneurysm of the ascending aorta, without rupture: I71.21

## 2014-01-13 HISTORY — DX: Cardiac arrest, cause unspecified: I46.9

## 2014-01-13 HISTORY — DX: Essential (primary) hypertension: I10

## 2014-01-13 HISTORY — DX: Thoracic aortic aneurysm, without rupture: I71.2

## 2014-01-13 NOTE — Progress Notes (Signed)
Patient ID: Gary [name], male   DOB: 1932/05/10, 80 y.o.   MRN: [id number]      Reason for office visit Ascending aortic aneurysm, hypertension, pacemaker for asystole  This is the first time meeting Gary [name], a long-time patient of Dr. Terance Ice.  He has a moderate ascending aortic aneurysm, systemic hypertension and had a couple of episodes of syncope associated with pauses and ventricular asystole for which she received a pacemaker in 1987. He subsequently underwent generator changes in 1998 and 2009 but still has the original unipolar leads. His ventricular lead shows very low impedance and has had deteriorating sensing, but still has excellent pacing thresholds. He paces less than 1% of the time in each chamber. His current generator is a [employer] broadcast assistant. His last pacemaker check with Dr. Rollene Fare was in September He'll be following up with Dr. Jolyn Nap.  He has an asymptomatic aneurysm of the ascending aorta measured at 4.7 cm on serial studies dating back to 2009. This is not associated with aortic insufficiency and has not changed over the years.  He has very few if any complaints. Has occasional mild ankle swelling that resolves within a couple of hours of putting his feet up. It is not changed from before. He denies chest discomfort shortness of breath dizziness or syncope. He does not have palpitations. He has a problem with balance and gait instability following remote treatment with aminoglycoside antibiotics. He has normal renal function.  He has been monitoring his blood pressure fairly closely. Has noticed a fairly consistent pattern over the last few days where his blood pressure is very normal in the mornings (120s over 70s) and is often high in the evenings (140s over low 90s). This is not associated with any somatic complaints.   Allergies  Allergen Reactions  . Aspirin     High doses cause stomach upset  . Gentamycin [Gentamicin Sulfate]   . Hydrocodone       Current Outpatient Prescriptions  Medication Sig Dispense Refill  . amLODipine (NORVASC) 5 MG tablet Take 1 tablet (5 mg total) by mouth daily.  30 tablet  10  . aspirin 81 MG tablet Take 81 mg by mouth daily.      . enalapril (VASOTEC) 20 MG tablet Take 1 tablet (20 mg total) by mouth daily.  60 tablet  5  . metoprolol succinate (TOPROL-XL) 50 MG 24 hr tablet Take 1 tablet (50 mg total) by mouth daily. Take with or immediately following a meal.  30 tablet  6  . omeprazole (PRILOSEC) 20 MG capsule Take 20 mg by mouth daily.      . simvastatin (ZOCOR) 20 MG tablet Take 1 tablet (20 mg total) by mouth at bedtime.  30 tablet  6   No current facility-administered medications for this visit.    Past Medical History  Diagnosis Date  . Hypertension   . Bradycardia     permanent pacemaker 11/22/08 St.Jude  . Hyperlipidemia   . Ascending aortic aneurysm 01/13/2014    stable at 4.7 cm diameter since 2009  . Asystole x2, s/p pacemaker 01/13/2014  . Pacemaker - leads 1987, last generator 2009 St. Jude dual-chamber 01/13/2014    Atrial lead model 433-01, ventricular lead model 431-01 implanted November 1987 Right ventricular lead dysfunction with low impedance and mediocre sensing but satisfactory pacing threshold  . Pacemaker lead malfunction 01/13/2014    Chronic low impedance and poor sensing of the ventricular lead  . HTN (hypertension) 01/13/2014  Past Surgical History  Procedure Laterality Date  . Permanent pacemaker insertion  03/04/97    ST. Jude  . Permanent pacemaker generator change  11/22/08    ST. Jude  . US echocardiography  05/16/11    AS mild to mod.,TR mild to mod., EF => 55%.    Family History  Problem Relation Age of Onset  . Cancer Mother   . Heart attack Father     History   Social History  . Marital Status: Married    Spouse Name: N/A    Number of Children: N/A  . Years of Education: N/A   Occupational History  . Not on file.   Social History Main Topics   . Smoking status: Never Smoker   . Smokeless tobacco: Not on file  . Alcohol Use: Yes     Comment: occas.  . Drug Use: No  . Sexual Activity: Not on file   Other Topics Concern  . Not on file   Social History Narrative  . No narrative on file    Review of systems: The patient specifically denies any chest pain at rest or with exertion, dyspnea at rest or with exertion, orthopnea, paroxysmal nocturnal dyspnea, syncope, palpitations, focal neurological deficits, intermittent claudication, lower extremity edema, unexplained weight gain, cough, hemoptysis or wheezing.  The patient also denies abdominal pain, nausea, vomiting, dysphagia, diarrhea, constipation, polyuria, polydipsia, dysuria, hematuria, frequency, urgency, abnormal bleeding or bruising, fever, chills, unexpected weight changes, mood swings, change in skin or hair texture, change in voice quality, auditory or visual problems, allergic reactions or rashes, new musculoskeletal complaints other than usual "aches and pains".   PHYSICAL EXAM There were no vitals taken for this visit.  General: Alert, oriented x3, no distress Head: no evidence of trauma, PERRL, EOMI, no exophtalmos or lid lag, no myxedema, no xanthelasma; normal ears, nose and oropharynx Neck: normal jugular venous pulsations and no hepatojugular reflux; brisk carotid pulses without delay and no carotid bruits Chest: clear to auscultation, no signs of consolidation by percussion or palpation, normal fremitus, symmetrical and full respiratory excursions, healthy right subclavian pacemaker site Cardiovascular: normal position and quality of the apical impulse, regular rhythm, normal first and second heart sounds, no murmurs, rubs or gallops Abdomen: no tenderness or distention, no masses by palpation, no abnormal pulsatility or arterial bruits, normal bowel sounds, no hepatosplenomegaly Extremities: no clubbing, cyanosis or edema; 2+ radial, ulnar and brachial pulses  bilaterally; 2+ right femoral, posterior tibial and dorsalis pedis pulses; 2+ left femoral, posterior tibial and dorsalis pedis pulses; no subclavian or femoral bruits Neurological: grossly nonfocal   EKG: normal sinus rhythm, long PR interval at 220 ms but otherwise normal   Normal nuclear stress test in January 2014  Echocardiogram May of 2012 normal left ventricular function, mild LVH, mild diastolic dysfunction, normal left atrial size, mild to moderate tricuspid regurgitation, mild aortic valve sclerosis without stenosis or regurgitation  Lipid Panel     Component Value Date/Time   CHOL 138 09/16/2013 1049   TRIG 93 09/16/2013 1049   HDL 49 09/16/2013 1049   CHOLHDL 2.8 09/16/2013 1049   VLDL 19 09/16/2013 1049   LDLCALC 70 09/16/2013 1049    BMET October 2014 potassium 4.3, creatinine 0.9, normal liver function tests, hemoglobin 14.1   ASSESSMENT AND PLAN  Recommend yearly monitoring of his moderate to large ascending aortic aneurysm, although for the last several years the dimensions have been stable. If this year his CT again shows the same aortic aneurysm  diameter, consider changing to an every other year schedule.  Blood pressure control is fair. It is possible that his evening hypertension is secondary to the relatively short duration of action of the enalapril. I've recommended that he split this in a twice daily schedule.  Pacemaker function remains normal despite problems with the almost 78 year old ventricular lead. When it is time for his generator change out he should probably receive a new bipolar atrial and ventricular leads. He will discuss this with Dr. Caryl Comes will be following his pacemaker.  Lipid parameters are in the desirable range.  Patient Instructions  Non-Cardiac CT scanning, (CAT scanning), is a noninvasive, special x-ray that produces cross-sectional images of the body using x-rays and a computer. CT scans help physicians diagnose and treat medical  conditions. For some CT exams, a contrast material is used to enhance visibility in the area of the body being studied. CT scans provide greater clarity and reveal more details than regular x-ray exams.  Dr. Sallyanne Kuster recommends that you schedule a follow-up appointment in: 6 months.      Orders Placed This Encounter  Procedures  . CT ANGIO CHEST AORTA W/CM &/OR WO/CM  . Basic Metabolic Panel (BMET)  . EKG 12-Lead   Meds ordered this encounter  Medications  . omeprazole (PRILOSEC) 20 MG capsule    Sig: Take 20 mg by mouth daily.  Marland Kitchen aspirin 81 MG tablet    Sig: Take 81 mg by mouth daily.    Holli Humbles, MD, [place] [phone] office [phone] pager

## 2014-01-13 NOTE — Patient Instructions (Signed)
Non-Cardiac CT scanning, (CAT scanning), is a noninvasive, special x-ray that produces cross-sectional images of the body using x-rays and a computer. CT scans help physicians diagnose and treat medical conditions. For some CT exams, a contrast material is used to enhance visibility in the area of the body being studied. CT scans provide greater clarity and reveal more details than regular x-ray exams.  Dr. Sallyanne Kuster recommends that you schedule a follow-up appointment in: 6 months.

## 2014-01-14 LAB — BASIC METABOLIC PANEL
BUN: 19 mg/dL (ref 6–23)
CO2: 29 mEq/L (ref 19–32)
Calcium: 8.8 mg/dL (ref 8.4–10.5)
Chloride: 102 mEq/L (ref 96–112)
Creat: 0.86 mg/dL (ref 0.50–1.35)
Glucose, Bld: 118 mg/dL — ABNORMAL HIGH (ref 70–99)
Potassium: 4.1 mEq/L (ref 3.5–5.3)
Sodium: 136 mEq/L (ref 135–145)

## 2014-01-17 ENCOUNTER — Telehealth: Payer: Self-pay | Admitting: *Deleted

## 2014-01-17 NOTE — Telephone Encounter (Signed)
Mailed copy of labs per patient request. Patient informed that this RN did not see documentation of Dr. Loletha Grayer ordering a home glucose monitor and that this will be deferred to him & Pamala Hurry.

## 2014-01-17 NOTE — Telephone Encounter (Signed)
Pt wants a copy of his lab work mailed to him. He also stated that he needs a glucose monitor and wanted Dr. Loletha Grayer to give a Rx for one.  [place]

## 2014-01-18 ENCOUNTER — Ambulatory Visit (HOSPITAL_COMMUNITY)
Admission: RE | Admit: 2014-01-18 | Discharge: 2014-01-18 | Disposition: A | Discharge status: Home / Self Care | Payer: Medicare Other | Source: Ambulatory Visit | Attending: Cardiovascular Disease | Admitting: Cardiovascular Disease

## 2014-01-18 DIAGNOSIS — K449 Diaphragmatic hernia without obstruction or gangrene: Secondary | ICD-10-CM | POA: Diagnosis not present

## 2014-01-18 DIAGNOSIS — I2510 Atherosclerotic heart disease of native coronary artery without angina pectoris: Secondary | ICD-10-CM | POA: Diagnosis not present

## 2014-01-18 DIAGNOSIS — I714 Abdominal aortic aneurysm, without rupture, unspecified: Secondary | ICD-10-CM

## 2014-01-18 DIAGNOSIS — K8020 Calculus of gallbladder without cholecystitis without obstruction: Secondary | ICD-10-CM | POA: Insufficient documentation

## 2014-01-18 DIAGNOSIS — I517 Cardiomegaly: Secondary | ICD-10-CM | POA: Insufficient documentation

## 2014-01-18 DIAGNOSIS — I712 Thoracic aortic aneurysm, without rupture, unspecified: Secondary | ICD-10-CM | POA: Insufficient documentation

## 2014-01-18 MED ORDER — IOHEXOL 350 MG/ML SOLN
100.0000 mL | Freq: Once | INTRAVENOUS | Status: AC | PRN
  Administered 2014-01-18: 100 mL via INTRAVENOUS

## 2014-01-18 NOTE — Telephone Encounter (Signed)
OK to order him  A glucose monitor and strips, but should follow up w PCP for DM management

## 2014-01-19 ENCOUNTER — Other Ambulatory Visit: Payer: Self-pay | Admitting: *Deleted

## 2014-01-19 MED ORDER — BLOOD GLUCOSE MONITOR KIT: PACK | Status: DC

## 2014-01-19 MED ORDER — BLOOD GLUC METER DISP-STRIPS DEVI: 1.0000 | Status: DC | PRN

## 2014-01-19 MED ORDER — ACCU-CHEK MULTICLIX LANCETS MISC: Status: DC

## 2014-01-19 NOTE — Telephone Encounter (Signed)
Dr. Loletha Grayer agreed to order a blood glucose monitor but any follow-up needs to come from his PCP.  Patient notified and voiced understanding.  Order sent to pharmacy.

## 2014-01-19 NOTE — Telephone Encounter (Signed)
Dr. C agreed to order a blood glucose monitor but any follow-up needs to come from his PCP.  Patient notified and voiced understanding.  Order sent to pharmacy. 

## 2014-02-11 DIAGNOSIS — M5137 Other intervertebral disc degeneration, lumbosacral region: Secondary | ICD-10-CM | POA: Diagnosis not present

## 2014-02-11 DIAGNOSIS — M545 Low back pain, unspecified: Secondary | ICD-10-CM | POA: Diagnosis not present

## 2014-03-08 DIAGNOSIS — N401 Enlarged prostate with lower urinary tract symptoms: Secondary | ICD-10-CM | POA: Diagnosis not present

## 2014-03-08 DIAGNOSIS — N138 Other obstructive and reflux uropathy: Secondary | ICD-10-CM | POA: Diagnosis not present

## 2014-03-23 ENCOUNTER — Ambulatory Visit: Payer: Medicare Other | Admitting: Cardiovascular Disease

## 2014-03-25 DIAGNOSIS — I712 Thoracic aortic aneurysm, without rupture, unspecified: Secondary | ICD-10-CM | POA: Diagnosis not present

## 2014-03-25 DIAGNOSIS — Z23 Encounter for immunization: Secondary | ICD-10-CM | POA: Diagnosis not present

## 2014-03-25 DIAGNOSIS — L821 Other seborrheic keratosis: Secondary | ICD-10-CM | POA: Diagnosis not present

## 2014-03-25 DIAGNOSIS — E7800 Pure hypercholesterolemia, unspecified: Secondary | ICD-10-CM | POA: Diagnosis not present

## 2014-03-25 DIAGNOSIS — Z0000 Encounter for general adult medical examination without abnormal findings: Secondary | ICD-10-CM | POA: Diagnosis not present

## 2014-03-25 DIAGNOSIS — Z950 Presence of cardiac pacemaker: Secondary | ICD-10-CM | POA: Diagnosis not present

## 2014-03-25 DIAGNOSIS — I10 Essential (primary) hypertension: Secondary | ICD-10-CM | POA: Diagnosis not present

## 2014-04-25 ENCOUNTER — Other Ambulatory Visit: Payer: Self-pay

## 2014-04-25 MED ORDER — OMEPRAZOLE 20 MG PO CPDR: 20.0000 mg | DELAYED_RELEASE_CAPSULE | Freq: Every day | ORAL | Status: DC

## 2014-04-25 NOTE — Telephone Encounter (Signed)
Rx was sent to pharmacy electronically. 

## 2014-05-31 ENCOUNTER — Other Ambulatory Visit: Payer: Self-pay | Admitting: *Deleted

## 2014-05-31 MED ORDER — SIMVASTATIN 20 MG PO TABS: 20.0000 mg | ORAL_TABLET | Freq: Every day | ORAL | Status: DC

## 2014-05-31 NOTE — Telephone Encounter (Signed)
Rx refill sent to patient pharmacy   

## 2014-06-07 ENCOUNTER — Other Ambulatory Visit: Payer: Self-pay | Admitting: *Deleted

## 2014-06-07 MED ORDER — METOPROLOL SUCCINATE ER 50 MG PO TB24: 50.0000 mg | ORAL_TABLET | Freq: Every day | ORAL | Status: DC

## 2014-06-07 NOTE — Telephone Encounter (Signed)
Rx refill sent to patient pharmacy electronically  

## 2014-06-08 ENCOUNTER — Other Ambulatory Visit: Payer: Self-pay

## 2014-06-08 MED ORDER — METOPROLOL SUCCINATE ER 50 MG PO TB24: 50.0000 mg | ORAL_TABLET | Freq: Every day | ORAL | Status: DC

## 2014-06-08 NOTE — Telephone Encounter (Signed)
Rx was sent to pharmacy electronically. 

## 2014-06-10 ENCOUNTER — Other Ambulatory Visit: Payer: Self-pay | Admitting: *Deleted

## 2014-07-08 ENCOUNTER — Telehealth: Payer: Self-pay | Admitting: Cardiovascular Disease

## 2014-07-08 NOTE — Telephone Encounter (Signed)
Pt says he is coming in Tuesday to see Dr C. He wanted to be sure Dr C check him and his pacemaker exactly like Dr Rollene Fare did.

## 2014-07-08 NOTE — Telephone Encounter (Signed)
Informed pt that his ppm will be checked at his appt on Tuesday.

## 2014-07-12 ENCOUNTER — Ambulatory Visit (INDEPENDENT_AMBULATORY_CARE_PROVIDER_SITE_OTHER): Payer: Medicare Other | Admitting: Cardiovascular Disease

## 2014-07-12 ENCOUNTER — Ambulatory Visit: Payer: Medicare Other | Admitting: Cardiovascular Disease

## 2014-07-12 ENCOUNTER — Encounter: Payer: Self-pay | Admitting: Cardiovascular Disease

## 2014-07-12 VITALS — BP 140/84 | HR 63 | Resp 16 | Ht 66.0 in | Wt 176.3 lb

## 2014-07-12 DIAGNOSIS — I7121 Aneurysm of the ascending aorta, without rupture: Secondary | ICD-10-CM

## 2014-07-12 DIAGNOSIS — Z79899 Other long term (current) drug therapy: Secondary | ICD-10-CM

## 2014-07-12 DIAGNOSIS — I712 Thoracic aortic aneurysm, without rupture, unspecified: Secondary | ICD-10-CM

## 2014-07-12 DIAGNOSIS — E785 Hyperlipidemia, unspecified: Secondary | ICD-10-CM | POA: Diagnosis not present

## 2014-07-12 DIAGNOSIS — Z950 Presence of cardiac pacemaker: Secondary | ICD-10-CM | POA: Diagnosis not present

## 2014-07-12 DIAGNOSIS — I10 Essential (primary) hypertension: Secondary | ICD-10-CM

## 2014-07-12 DIAGNOSIS — I469 Cardiac arrest, cause unspecified: Secondary | ICD-10-CM

## 2014-07-12 DIAGNOSIS — I2510 Atherosclerotic heart disease of native coronary artery without angina pectoris: Secondary | ICD-10-CM

## 2014-07-12 LAB — MDC_IDC_ENUM_SESS_TYPE_INCLINIC
Battery Impedance: 1400 Ohm
Battery Voltage: 2.79 V
Date Time Interrogation Session: 20150728134800
Implantable Pulse Generator Model: 5826
Implantable Pulse Generator Serial Number: 2228740
Lead Channel Impedance Value: 200 Ohm — CL
Lead Channel Impedance Value: 234 Ohm
Lead Channel Pacing Threshold Amplitude: 0.75 V
Lead Channel Pacing Threshold Amplitude: 1 V
Lead Channel Pacing Threshold Pulse Width: 0.5 ms
Lead Channel Pacing Threshold Pulse Width: 0.5 ms
Lead Channel Setting Pacing Amplitude: 2 V
Lead Channel Setting Pacing Amplitude: 2.5 V
Lead Channel Setting Pacing Pulse Width: 0.5 ms
Lead Channel Setting Sensing Sensitivity: 1.5 mV

## 2014-07-12 NOTE — Patient Instructions (Signed)
Your physician recommends that you return for lab work in: a few days to a week - you will need to be fasting.   Your physician wants you to follow-up in: 6 months with Dr. Sallyanne Kuster (on a Tuesday - with device check) You will receive a reminder letter in the mail two months in advance. If you don't receive a letter, please call our office to schedule the follow-up appointment.

## 2014-07-13 ENCOUNTER — Encounter: Payer: Self-pay | Admitting: Cardiovascular Disease

## 2014-07-13 DIAGNOSIS — E785 Hyperlipidemia, unspecified: Secondary | ICD-10-CM | POA: Diagnosis not present

## 2014-07-13 DIAGNOSIS — I2510 Atherosclerotic heart disease of native coronary artery without angina pectoris: Secondary | ICD-10-CM | POA: Insufficient documentation

## 2014-07-13 DIAGNOSIS — L259 Unspecified contact dermatitis, unspecified cause: Secondary | ICD-10-CM | POA: Diagnosis not present

## 2014-07-13 DIAGNOSIS — Z79899 Other long term (current) drug therapy: Secondary | ICD-10-CM | POA: Diagnosis not present

## 2014-07-13 NOTE — Assessment & Plan Note (Signed)
Adequate control. Suspect amlodipine is responsible for his mild ankle edema.

## 2014-07-13 NOTE — Assessment & Plan Note (Signed)
Unchanged in size for years - will decrease CTA frequency to every other year.

## 2014-07-13 NOTE — Assessment & Plan Note (Signed)
Excellent last year - time to recheck

## 2014-07-13 NOTE — Assessment & Plan Note (Signed)
Right ventricular lead dysfunction with low impedance and mediocre sensing but satisfactory pacing threshold - no indication for revision - rarely paces. Devise not amenable to remote monitoring.

## 2014-07-13 NOTE — Assessment & Plan Note (Signed)
Asymptomatic, focus on risk factor modification. Moderately overweight, discussed diet and exercise - try to get waistline down to 34".

## 2014-07-13 NOTE — Progress Notes (Signed)
Patient ID: Gary [name], male   DOB: 1932/06/20, 80 y.o.   MRN: [id number]      Reason for office visit Ascending aortic aneurysm, sinus node pauses/pacemaker  He is feeling well - no cardiac complaints other than occasional ankle edema, that resolves promptly after overnight recumbent position.  Pacemaker check shows no changes from prior checks - rarely paces either chamber, isolated low ventricular lead impedance with otherwise normal parameters.  He has a moderate ascending aortic aneurysm, systemic hypertension and had a couple of episodes of syncope associated with pauses and ventricular asystole for which she received a pacemaker in 1987. He subsequently underwent generator changes in 1998 and 2009 but still has the original unipolar leads. His ventricular lead shows very low impedance and has had deteriorating sensing, but still has excellent pacing thresholds. He paces less than 1% of the time in each chamber. His current generator is a Radio broadcast assistant. He has an asymptomatic aneurysm of the ascending aorta measured at 4.7 cm on serial studies dating back to 2009. This is not associated with aortic insufficiency and has not changed over the years.    Allergies  Allergen Reactions  . Aspirin     High doses cause stomach upset  . Gentamycin [Gentamicin Sulfate]   . Hydrocodone     Current Outpatient Prescriptions  Medication Sig Dispense Refill  . amLODipine (NORVASC) 5 MG tablet Take 1 tablet (5 mg total) by mouth daily.  30 tablet  10  . aspirin 81 MG tablet Take 81 mg by mouth daily.      . Blood Gluc Meter Disp-Strips (BLOOD GLUCOSE METER DISPOSABLE) DEVI 1 Bottle by Does not apply route as needed.  50 each  0  . Blood Glucose Monitoring Suppl (BLOOD GLUCOSE MONITOR KIT) KIT Check blood glucose as needed  1 each  0  . enalapril (VASOTEC) 20 MG tablet Take 1 tablet (20 mg total) by mouth daily.  60 tablet  5  . Lancets (ACCU-CHEK MULTICLIX) lancets Use as instructed  100  each  0  . metoprolol succinate (TOPROL-XL) 50 MG 24 hr tablet Take 1 tablet (50 mg total) by mouth daily. Take with or immediately following a meal.  30 tablet  7  . omeprazole (PRILOSEC) 20 MG capsule Take 1 capsule (20 mg total) by mouth daily.  30 capsule  8  . simvastatin (ZOCOR) 20 MG tablet Take 1 tablet (20 mg total) by mouth at bedtime.  30 tablet  6   No current facility-administered medications for this visit.    Past Medical History  Diagnosis Date  . Hypertension   . Bradycardia     permanent pacemaker 11/22/08 [employer]Jude  . Hyperlipidemia   . Ascending aortic aneurysm 01/13/2014    stable at 4.7 cm diameter since 2009  . Asystole x2, s/p pacemaker 01/13/2014  . Pacemaker - leads 1987, last generator 2009 [employer] Jude dual-chamber 01/13/2014    Atrial lead model 433-01, ventricular lead model 431-01 implanted November 1987 Right ventricular lead dysfunction with low impedance and mediocre sensing but satisfactory pacing threshold  . Pacemaker lead malfunction 01/13/2014    Chronic low impedance and poor sensing of the ventricular lead  . HTN (hypertension) 01/13/2014    Past Surgical History  Procedure Laterality Date  . Permanent pacemaker insertion  03/04/97    ST. Jude  . Permanent pacemaker generator change  11/22/08    [employer]. Jude  . US echocardiography  05/16/11    AS mild to mod.,TR  mild to mod., EF => 55%.    Family History  Problem Relation Age of Onset  . Cancer Mother   . Heart attack Father     History   Social History  . Marital Status: Married    Spouse Name: N/A    Number of Children: N/A  . Years of Education: N/A   Occupational History  . Not on file.   Social History Main Topics  . Smoking status: Never Smoker   . Smokeless tobacco: Not on file  . Alcohol Use: Yes     Comment: occas.  . Drug Use: No  . Sexual Activity: Not on file   Other Topics Concern  . Not on file   Social History Narrative  . No narrative on file    Review of  systems: The patient specifically denies any chest pain at rest or with exertion, dyspnea at rest or with exertion, orthopnea, paroxysmal nocturnal dyspnea, syncope, palpitations, focal neurological deficits, intermittent claudication, unexplained weight gain, cough, hemoptysis or wheezing.  The patient also denies abdominal pain, nausea, vomiting, dysphagia, diarrhea, constipation, polyuria, polydipsia, dysuria, hematuria, frequency, urgency, abnormal bleeding or bruising, fever, chills, unexpected weight changes, mood swings, change in skin or hair texture, change in voice quality, auditory or visual problems, allergic reactions or rashes, new musculoskeletal complaints other than usual "aches and pains".   PHYSICAL EXAM BP 140/84  Pulse 63  Resp 16  Ht '5\' 6"'  (1.676 m)  Wt 176 lb 4.8 oz (79.969 kg)  BMI 28.47 kg/m2  General: Alert, oriented x3, no distress  Head: no evidence of trauma, PERRL, EOMI, no exophtalmos or lid lag, no myxedema, no xanthelasma; normal ears, nose and oropharynx  Neck: normal jugular venous pulsations and no hepatojugular reflux; brisk carotid pulses without delay and no carotid bruits  Chest: clear to auscultation, no signs of consolidation by percussion or palpation, normal fremitus, symmetrical and full respiratory excursions, healthy right subclavian pacemaker site  Cardiovascular: normal position and quality of the apical impulse, regular rhythm, normal first and second heart sounds, no murmurs, rubs or gallops  Abdomen: no tenderness or distention, no masses by palpation, no abnormal pulsatility or arterial bruits, normal bowel sounds, no hepatosplenomegaly  Extremities: no clubbing, cyanosis or edema; 2+ radial, ulnar and brachial pulses bilaterally; 2+ right femoral, posterior tibial and dorsalis pedis pulses; 2+ left femoral, posterior tibial and dorsalis pedis pulses; no subclavian or femoral bruits  Neurological: grossly nonfocal  EKG: NSR  Lipid Panel      Component Value Date/Time   CHOL 138 09/16/2013 1049   TRIG 93 09/16/2013 1049   HDL 49 09/16/2013 1049   CHOLHDL 2.8 09/16/2013 1049   VLDL 19 09/16/2013 1049   LDLCALC 70 09/16/2013 1049    BMET    Component Value Date/Time   NA 136 01/13/2014 1630   K 4.1 01/13/2014 1630   CL 102 01/13/2014 1630   CO2 29 01/13/2014 1630   GLUCOSE 118* 01/13/2014 1630   BUN 19 01/13/2014 1630   CREATININE 0.86 01/13/2014 1630   CREATININE 1.05 12/12/2008 0608   CALCIUM 8.8 01/13/2014 1630   GFRNONAA >60 12/12/2008 0608   GFRAA  Value: >60        The eGFR has been calculated using the MDRD equation. This calculation has not been validated in all clinical 12/12/2008 0608    CHEST CTA Ascending aortic aneurysm, which measures maximally 4.4 x 4.8 cm on  transverse image 41. 4.6 x 4.6 cm on the prior  exam. Based on  coronal images, measures 2.3 cm at the level of the aortic valve  ileus, 3.1 cm at the sinuses of Valsalva, and 2.6 cm at the  sino-tubular junction. Maximally 4.3 cm (a few cm above the  sino-tubular junction) on coronal image 37 today versus similar on  image 35 of the prior exam (when remeasured).  No dissection. Normal caliber of the transverse and descending  segments. Cardiomegaly with coronary artery atherosclerosis.  ASSESSMENT AND PLAN Coronary atherosclerosis Asymptomatic, focus on risk factor modification. Moderately overweight, discussed diet and exercise - try to get waistline down to 34".  Pacemaker - leads 1987, last generator 2009 [employer] 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 Devise not amenable to remote monitoring.  Ascending aortic aneurysm Unchanged in size for years - will decrease CTA frequency to every other year.  HTN (hypertension) Adequate control. Suspect amlodipine is responsible for his mild ankle edema.  Hyperlipidemia Excellent  last year - time to recheck   Patient Instructions  Your physician recommends that you return for lab work in: a few days to a week - you will need to be fasting.   Your physician wants you to follow-up in: 6 months with Dr. Sallyanne Kuster (on a Tuesday - with device check) You will receive a reminder letter in the mail two months in advance. If you don't receive a letter, please call our office to schedule the follow-up appointment.       Orders Placed This Encounter  Procedures  . Lipid Profile  . Comprehensive metabolic panel  . Implantable device check  . EKG 12-Lead   No orders of the defined types were placed in this encounter.    Holli Humbles, MD, [place] [phone] office [phone] pager

## 2014-07-14 ENCOUNTER — Encounter: Payer: Self-pay | Admitting: *Deleted

## 2014-07-14 ENCOUNTER — Telehealth: Payer: Self-pay | Admitting: *Deleted

## 2014-07-14 LAB — LIPID PANEL
Cholesterol: 145 mg/dL (ref 0–200)
HDL: 47 mg/dL (ref >39)
LDL Cholesterol: 77 mg/dL (ref 0–99)
Total CHOL/HDL Ratio: 3.1 Ratio
Triglycerides: 103 mg/dL (ref <150)
VLDL: 21 mg/dL (ref 0–40)

## 2014-07-14 LAB — COMPREHENSIVE METABOLIC PANEL
ALT: 18 U/L (ref 0–53)
AST: 24 U/L (ref 0–37)
Albumin: 3.9 g/dL (ref 3.5–5.2)
Alkaline Phosphatase: 66 U/L (ref 39–117)
BUN: 12 mg/dL (ref 6–23)
CO2: 27 mEq/L (ref 19–32)
Calcium: 9.3 mg/dL (ref 8.4–10.5)
Chloride: 102 mEq/L (ref 96–112)
Creat: 0.99 mg/dL (ref 0.50–1.35)
Glucose, Bld: 80 mg/dL (ref 70–99)
Potassium: 4.5 mEq/L (ref 3.5–5.3)
Sodium: 140 mEq/L (ref 135–145)
Total Bilirubin: 1 mg/dL (ref 0.2–1.2)
Total Protein: 7 g/dL (ref 6.0–8.3)

## 2014-07-14 NOTE — Telephone Encounter (Signed)
Spoke to patient. Result given . Verbalized understanding Patient states he will loss 20 lbs by next visit He would like a copy. Mailed lab results

## 2014-07-14 NOTE — Telephone Encounter (Signed)
Message copied by Raiford Simmonds on Thu Jul 14, 2014  2:36 PM ------      Message from: Sanda Klein      Created: Thu Jul 14, 2014 11:42 AM       Overall, labs are great, but LDL-C a little higher. No change in meds, try to lose weight as discussed in clinic ------

## 2014-07-22 ENCOUNTER — Other Ambulatory Visit: Payer: Self-pay | Admitting: *Deleted

## 2014-07-22 MED ORDER — ENALAPRIL MALEATE 20 MG PO TABS: 20.0000 mg | ORAL_TABLET | Freq: Every day | ORAL | Status: DC

## 2014-07-26 ENCOUNTER — Other Ambulatory Visit: Payer: Self-pay

## 2014-07-26 MED ORDER — AMLODIPINE BESYLATE 5 MG PO TABS: 5.0000 mg | ORAL_TABLET | Freq: Every day | ORAL | Status: DC

## 2014-07-26 NOTE — Telephone Encounter (Signed)
Rx was sent to pharmacy electronically. 

## 2014-08-10 DIAGNOSIS — L600 Ingrowing nail: Secondary | ICD-10-CM | POA: Diagnosis not present

## 2014-09-14 ENCOUNTER — Encounter: Payer: Self-pay | Admitting: Cardiovascular Disease

## 2014-09-16 ENCOUNTER — Ambulatory Visit: Payer: Self-pay | Admitting: Podiatrist

## 2014-09-19 ENCOUNTER — Telehealth: Payer: Self-pay | Admitting: *Deleted

## 2014-09-19 ENCOUNTER — Encounter: Payer: Self-pay | Admitting: Podiatry

## 2014-09-19 ENCOUNTER — Ambulatory Visit (INDEPENDENT_AMBULATORY_CARE_PROVIDER_SITE_OTHER): Payer: Medicare Other | Admitting: Podiatry

## 2014-09-19 VITALS — BP 145/87 | HR 64 | Resp 16

## 2014-09-19 DIAGNOSIS — I2510 Atherosclerotic heart disease of native coronary artery without angina pectoris: Secondary | ICD-10-CM | POA: Diagnosis not present

## 2014-09-19 DIAGNOSIS — L600 Ingrowing nail: Secondary | ICD-10-CM

## 2014-09-19 NOTE — Patient Instructions (Signed)
ANTIBACTERIAL SOAP INSTRUCTIONS  THE DAY AFTER PROCEDURE  Please follow the instructions your doctor has marked.   Shower as usual. Before getting out, place a drop of antibacterial liquid soap (Dial) on a wet, clean washcloth.  Gently wipe washcloth over affected area.  Afterward, rinse the area with warm water.  Blot the area dry with a soft cloth and cover with antibiotic ointment (neosporin, polysporin, bacitracin) and band aid or gauze and tape  OR   Place 3-4 drops of antibacterial liquid soap in a quart of warm tap water.  Submerge foot into water for 20 minutes.  If bandage was applied after your procedure, leave on to allow for easy lift off, then remove and continue with soak for the remaining time.  Next, blot area dry with a soft cloth and cover with a bandage.  Apply other medications as directed by your doctor, such as cortisporin otic solution (eardrops) or neosporin antibiotic ointment   Long Term Care Instructions-Post Nail Surgery  You have had your ingrown toenail and root treated with a chemical.  This chemical causes a burn that will drain and ooze like a blister.  This can drain for 6-8 weeks or longer.  It is important to keep this area clean, covered, and follow the soaking instructions dispensed at the time of your surgery.  This area will eventually dry and form a scab.  Once the scab forms you no longer need to soak or apply a dressing.  If at any time you experience an increase in pain, redness, swelling, or drainage, you should contact the office as soon as possible. 

## 2014-09-19 NOTE — Progress Notes (Signed)
   Subjective:    Patient ID: Gary [name], male    DOB: 19-Jun-1932, 80 y.o.   MRN: [id number]  HPI Comments: "I have an ingrown toenail, I think"  Patient c/o tender 1st toe left, lateral border, for about 3 months. The area is swollen and the nail incurvated. He did go to Urgent Care to have checked. Been soaking and using antibiotic ointment. Not getting better.  Toe Pain       Review of Systems  HENT: Positive for hearing loss, sinus pressure, sneezing and tinnitus.   Endocrine: Positive for polyuria.  Genitourinary: Positive for urgency.  Musculoskeletal: Positive for gait problem.  All other systems reviewed and are negative.      Objective:   Physical Exam        Assessment & Plan:

## 2014-09-19 NOTE — Telephone Encounter (Signed)
He had his ingrown toenail removed this morning.  He wants to ask the doctor [facility]  So please call us back.  Thank you.  I called and informed her as long as [place] doesn't consist of any kicking he should be fine.  She stated, "it just involves movement.  Maybe he should stop for about a week or two."  I told her that is not necessary.  She stated, "Okay, thank you."

## 2014-09-19 NOTE — Progress Notes (Signed)
Subjective:     Patient ID: Gary [name], male   DOB: Sep 04, 1932, 80 y.o.   MRN: [id number]  Toe Pain    patient presents stating I have a painful ingrown toenail my left big toe and at one removed on my right about 30 years ago   Review of Systems  All other systems reviewed and are negative.      Objective:   Physical Exam  Vitals reviewed. Constitutional: He is oriented to person, place, and time.  Cardiovascular: Intact distal pulses.   Musculoskeletal: Normal range of motion.  Neurological: He is oriented to person, place, and time.  Skin: Skin is warm.   neurovascular status is found to be intact with muscle strength adequate and range of motion subtalar midtarsal joint within normal limits. Patient's noted to have incurvated lateral border left hallux with pain good digital perfusion and he is well oriented x3. Patient is noted to have no other significant foot pathology at this time     Assessment:     Ingrown toenail deformity left hallux lateral border    Plan:     H&P and condition reviewed. Recommended removal of corner and explained risk. Patient wants procedure and today I infiltrated 60 mg Xylocaine Marcaine mixture remove the lateral border exposed matrix and apply chemical phenol 3 applications 30 seconds followed by alcohol lavaged and sterile dressing. Reappoint her recheck

## 2014-09-26 ENCOUNTER — Ambulatory Visit: Payer: Self-pay | Admitting: Podiatry

## 2014-10-04 DIAGNOSIS — Z23 Encounter for immunization: Secondary | ICD-10-CM | POA: Diagnosis not present

## 2014-11-07 DIAGNOSIS — H52203 Unspecified astigmatism, bilateral: Secondary | ICD-10-CM | POA: Diagnosis not present

## 2014-11-07 DIAGNOSIS — Z961 Presence of intraocular lens: Secondary | ICD-10-CM | POA: Diagnosis not present

## 2014-12-22 ENCOUNTER — Other Ambulatory Visit: Payer: Self-pay | Admitting: Cardiovascular Disease

## 2015-01-10 ENCOUNTER — Encounter: Payer: Medicare Other | Admitting: Cardiovascular Disease

## 2015-01-13 ENCOUNTER — Other Ambulatory Visit: Payer: Self-pay | Admitting: Cardiovascular Disease

## 2015-01-13 NOTE — Telephone Encounter (Signed)
Rx(s) sent to pharmacy electronically.  

## 2015-02-03 ENCOUNTER — Other Ambulatory Visit: Payer: Self-pay | Admitting: Cardiovascular Disease

## 2015-02-03 NOTE — Telephone Encounter (Signed)
Rx(s) sent to pharmacy electronically.  

## 2015-02-07 ENCOUNTER — Ambulatory Visit (INDEPENDENT_AMBULATORY_CARE_PROVIDER_SITE_OTHER): Payer: Medicare Other | Admitting: Cardiovascular Disease

## 2015-02-07 ENCOUNTER — Encounter: Payer: Self-pay | Admitting: Cardiovascular Disease

## 2015-02-07 VITALS — BP 134/82 | HR 61 | Ht 67.0 in | Wt 178.9 lb

## 2015-02-07 DIAGNOSIS — I7121 Aneurysm of the ascending aorta, without rupture: Secondary | ICD-10-CM

## 2015-02-07 DIAGNOSIS — I469 Cardiac arrest, cause unspecified: Secondary | ICD-10-CM

## 2015-02-07 DIAGNOSIS — T82110D Breakdown (mechanical) of cardiac electrode, subsequent encounter: Secondary | ICD-10-CM | POA: Diagnosis not present

## 2015-02-07 DIAGNOSIS — I2510 Atherosclerotic heart disease of native coronary artery without angina pectoris: Secondary | ICD-10-CM

## 2015-02-07 DIAGNOSIS — E785 Hyperlipidemia, unspecified: Secondary | ICD-10-CM | POA: Diagnosis not present

## 2015-02-07 DIAGNOSIS — Z950 Presence of cardiac pacemaker: Secondary | ICD-10-CM | POA: Diagnosis not present

## 2015-02-07 DIAGNOSIS — I712 Thoracic aortic aneurysm, without rupture: Secondary | ICD-10-CM | POA: Diagnosis not present

## 2015-02-07 LAB — MDC_IDC_ENUM_SESS_TYPE_INCLINIC
Battery Impedance: 1500 Ohm
Battery Voltage: 2.79 V
Date Time Interrogation Session: 20160223110029
Implantable Pulse Generator Model: 5826
Implantable Pulse Generator Serial Number: 2228740
Lead Channel Impedance Value: 200 Ohm — CL
Lead Channel Impedance Value: 230 Ohm
Lead Channel Pacing Threshold Amplitude: 0.75 V
Lead Channel Pacing Threshold Amplitude: 1.5 V
Lead Channel Pacing Threshold Pulse Width: 0.5 ms
Lead Channel Pacing Threshold Pulse Width: 0.5 ms
Lead Channel Sensing Intrinsic Amplitude: 2.2 mV
Lead Channel Sensing Intrinsic Amplitude: 2.3 mV
Lead Channel Setting Pacing Amplitude: 2 V
Lead Channel Setting Pacing Amplitude: 3 V
Lead Channel Setting Pacing Pulse Width: 0.5 ms
Lead Channel Setting Sensing Sensitivity: 1 mV

## 2015-02-07 NOTE — Patient Instructions (Signed)
Your physician recommends that you schedule a follow-up appointment in: 6 months with Dr.Croitoru    

## 2015-02-09 ENCOUNTER — Encounter: Payer: Self-pay | Admitting: Cardiovascular Disease

## 2015-02-09 ENCOUNTER — Telehealth: Payer: Self-pay | Admitting: Cardiovascular Disease

## 2015-02-09 DIAGNOSIS — E785 Hyperlipidemia, unspecified: Secondary | ICD-10-CM

## 2015-02-09 NOTE — Telephone Encounter (Signed)
Yes, lipid profile and CMET please

## 2015-02-09 NOTE — Telephone Encounter (Signed)
Message sent to Dr.Croitoru for advice. 

## 2015-02-09 NOTE — Telephone Encounter (Signed)
Pt saw Dr C this week,wants to know if he needs to get some blood work?

## 2015-02-09 NOTE — Telephone Encounter (Signed)
Returned call to patient's wife Dr.Croitoru advised lipid profile and cmet.Order entered patient will have done 02/10/15.

## 2015-02-09 NOTE — Progress Notes (Signed)
Patient ID: Gary [name], male   DOB: 02/17/1932, 81 y.o.   MRN: [id number]     Reason for office visit Ascending aortic aneurysm, Pacemaker check, hyperlipidemia, hypertension  He has a moderate ascending aortic aneurysm (stable diameter 4.44.8 cm, stable since 2009, no aortic insufficiency), systemic hypertension and had a couple of episodes of syncope associated with pauses and ventricular asystole for which she received a pacemaker in 1987. He subsequently underwent generator changes in 1998 and 2009 but still has the original unipolar leads. His ventricular lead shows very low impedance and has had deteriorating sensing, but still has excellent pacing thresholds. He paces less than 1% of the time in each chamber. His current generator is a Radio broadcast assistant. His pacemaker was implanted for episodes of asystole that occurred while he was in treatment with Thorazine and Propulsid for intractable hiccups. Retrospectively it is felt that the medications were the cause for his arrhythmia. He has not required pacing since those medications were stopped.  He has no complaints other than occasional ankle swelling that is more obvious at the end of the day and resolves by the following morning. This is unchanged from previous visits. He is physically active and exercises regularly. He denies neurological complaints, chest discomfort, shortness of breath, syncope or palpitations.  Interrogation of his pacemaker shows findings similar to last time. The generator still has 2-5 years of longevity and he has significant abnormalities of his 81 year old unipolar atrial and ventricular leads (low impedance and deteriorating sensing). He never paces either the atria or the ventricles. Allergies  Allergen Reactions  . Aspirin     High doses cause stomach upset  . Gentamycin [Gentamicin Sulfate]   . Hydrocodone     Current Outpatient Prescriptions  Medication Sig Dispense Refill  . amLODipine (NORVASC) 5 MG  tablet Take 1 tablet (5 mg total) by mouth daily. 30 tablet 11  . aspirin 81 MG tablet Take 81 mg by mouth daily.    . Blood Glucose Monitoring Suppl (BLOOD GLUCOSE MONITOR KIT) KIT Check blood glucose as needed 1 each 0  . enalapril (VASOTEC) 20 MG tablet TAKE 1 TABLET (20 MG TOTAL) BY MOUTH DAILY. 30 tablet 5  . metoprolol succinate (TOPROL-XL) 50 MG 24 hr tablet Take 1 tablet (50 mg total) by mouth daily. Take with or immediately following a meal. 30 tablet 7  . omeprazole (PRILOSEC) 20 MG capsule Take 1 capsule (20 mg total) by mouth daily. 30 capsule 8  . simvastatin (ZOCOR) 20 MG tablet TAKE 1 TABLET (20 MG TOTAL) BY MOUTH AT BEDTIME. 30 tablet 6   No current facility-administered medications for this visit.    Past Medical History  Diagnosis Date  . Hypertension   . Bradycardia     permanent pacemaker 11/22/08 [employer]Jude  . Hyperlipidemia   . Ascending aortic aneurysm 01/13/2014    stable at 4.7 cm diameter since 2009  . Asystole x2, s/p pacemaker 01/13/2014  . Pacemaker - leads 1987, last generator 2009 [employer] Jude dual-chamber 01/13/2014    Atrial lead model 433-01, ventricular lead model 431-01 implanted November 1987 Right ventricular lead dysfunction with low impedance and mediocre sensing but satisfactory pacing threshold  . Pacemaker lead malfunction 01/13/2014    Chronic low impedance and poor sensing of the ventricular lead  . HTN (hypertension) 01/13/2014    Past Surgical History  Procedure Laterality Date  . Permanent pacemaker insertion  03/04/97    [employer]. Jude  . Permanent pacemaker generator change  11/22/08  [employer] Jude  . US echocardiography  05/16/11    AS mild to mod.,TR mild to mod., EF => 55%.    Family History  Problem Relation Age of Onset  . Cancer Mother   . Heart attack Father     History   Social History  . Marital Status: Married    Spouse Name: N/A  . Number of Children: N/A  . Years of Education: N/A   Occupational History  . Not on file.    Social History Main Topics  . Smoking status: Never Smoker   . Smokeless tobacco: Not on file  . Alcohol Use: Yes     Comment: occas.  . Drug Use: No  . Sexual Activity: Not on file   Other Topics Concern  . Not on file   Social History Narrative    Review of systems: The patient specifically denies any chest pain at rest or with exertion, dyspnea at rest or with exertion, orthopnea, paroxysmal nocturnal dyspnea, syncope, palpitations, focal neurological deficits, intermittent claudication, persistent lower extremity edema, unexplained weight gain, cough, hemoptysis or wheezing.  The patient also denies abdominal pain, nausea, vomiting, dysphagia, diarrhea, constipation, polyuria, polydipsia, dysuria, hematuria, frequency, urgency, abnormal bleeding or bruising, fever, chills, unexpected weight changes, mood swings, change in skin or hair texture, change in voice quality, auditory or visual problems, allergic reactions or rashes, new musculoskeletal complaints other than usual "aches and pains".   PHYSICAL EXAM BP 134/82 mmHg  Pulse 61  Ht '5\' 7"'  (1.702 m)  Wt 178 lb 14.4 oz (81.149 kg)  BMI 28.01 kg/m2 General: Alert, oriented x3, no distress  Head: no evidence of trauma, PERRL, EOMI, no exophtalmos or lid lag, no myxedema, no xanthelasma; normal ears, nose and oropharynx  Neck: normal jugular venous pulsations and no hepatojugular reflux; brisk carotid pulses without delay and no carotid bruits  Chest: clear to auscultation, no signs of consolidation by percussion or palpation, normal fremitus, symmetrical and full respiratory excursions, healthy right subclavian pacemaker site  Cardiovascular: normal position and quality of the apical impulse, regular rhythm, normal first and second heart sounds, no murmurs, rubs or gallops  Abdomen: no tenderness or distention, no masses by palpation, no abnormal pulsatility or arterial bruits, normal bowel sounds, no hepatosplenomegaly   Extremities: no clubbing, cyanosis or edema; 2+ radial, ulnar and brachial pulses bilaterally; 2+ right femoral, posterior tibial and dorsalis pedis pulses; 2+ left femoral, posterior tibial and dorsalis pedis pulses; no subclavian or femoral bruits  Neurological: grossly nonfocal  EKG: nsr  Lipid Panel     Component Value Date/Time   CHOL 145 07/12/2014 1029   TRIG 103 07/12/2014 1029   HDL 47 07/12/2014 1029   CHOLHDL 3.1 07/12/2014 1029   VLDL 21 07/12/2014 1029   LDLCALC 77 07/12/2014 1029    BMET    Component Value Date/Time   NA 140 07/12/2014 1029   K 4.5 07/12/2014 1029   CL 102 07/12/2014 1029   CO2 27 07/12/2014 1029   GLUCOSE 80 07/12/2014 1029   BUN 12 07/12/2014 1029   CREATININE 0.99 07/12/2014 1029   CREATININE 1.05 12/12/2008 0608   CALCIUM 9.3 07/12/2014 1029   GFRNONAA >60 12/12/2008 0608   GFRAA  12/12/2008 0608    >60        The eGFR has been calculated using the MDRD equation. This calculation has not been validated in all clinical     ASSESSMENT AND PLAN Coronary atherosclerosis Asymptomatic, focus on risk factor modification. Moderately  overweight, discussed diet and exercise - try to get waistline down to 34".  Pacemaker - leads 1987, last generator 2009 [employer] 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 Device not amenable to remote monitoring. When the device reaches elective replacement indicator I would give strong consideration to not replacing the generator. It really looks like he doesn't need a pacemaker.  Ascending aortic aneurysm Unchanged in size for years - decreased CTA frequency to every other year. Plan a repeat CT of the chest in 2017  HTN (hypertension) Adequate control. Suspect amlodipine is responsible for his mild ankle edema.  Hyperlipidemia Excellent lipid parameters on current  treatment  Orders Placed This Encounter  Procedures  . Implantable device check  . EKG 12-Lead   No orders of the defined types were placed in this encounter.    Holli Humbles, MD, [place] [phone] office [phone] pager

## 2015-02-10 LAB — COMPREHENSIVE METABOLIC PANEL
ALT: 17 U/L (ref 0–53)
AST: 21 U/L (ref 0–37)
Albumin: 4 g/dL (ref 3.5–5.2)
Alkaline Phosphatase: 55 U/L (ref 39–117)
BUN: 24 mg/dL — ABNORMAL HIGH (ref 6–23)
CO2: 23 mEq/L (ref 19–32)
Calcium: 9 mg/dL (ref 8.4–10.5)
Chloride: 103 mEq/L (ref 96–112)
Creat: 1 mg/dL (ref 0.50–1.35)
Glucose, Bld: 82 mg/dL (ref 70–99)
Potassium: 4.3 mEq/L (ref 3.5–5.3)
Sodium: 139 mEq/L (ref 135–145)
Total Bilirubin: 1 mg/dL (ref 0.2–1.2)
Total Protein: 6.8 g/dL (ref 6.0–8.3)

## 2015-02-10 LAB — LIPID PANEL
Cholesterol: 140 mg/dL (ref 0–200)
HDL: 49 mg/dL (ref >=40)
LDL Cholesterol: 74 mg/dL (ref 0–99)
Total CHOL/HDL Ratio: 2.9 Ratio
Triglycerides: 83 mg/dL (ref <150)
VLDL: 17 mg/dL (ref 0–40)

## 2015-02-23 DIAGNOSIS — R05 Cough: Secondary | ICD-10-CM | POA: Diagnosis not present

## 2015-02-23 DIAGNOSIS — B349 Viral infection, unspecified: Secondary | ICD-10-CM | POA: Diagnosis not present

## 2015-03-01 ENCOUNTER — Ambulatory Visit
Admission: RE | Admit: 2015-03-01 | Discharge: 2015-03-01 | Disposition: A | Discharge status: Home / Self Care | Payer: Medicare Other | Source: Ambulatory Visit | Attending: Family Medicine | Admitting: Family Medicine

## 2015-03-01 ENCOUNTER — Other Ambulatory Visit: Payer: Self-pay | Admitting: Family Medicine

## 2015-03-01 DIAGNOSIS — R058 Other specified cough: Secondary | ICD-10-CM

## 2015-03-01 DIAGNOSIS — R05 Cough: Secondary | ICD-10-CM | POA: Diagnosis not present

## 2015-03-01 DIAGNOSIS — B349 Viral infection, unspecified: Secondary | ICD-10-CM | POA: Diagnosis not present

## 2015-04-04 DIAGNOSIS — I10 Essential (primary) hypertension: Secondary | ICD-10-CM | POA: Diagnosis not present

## 2015-04-04 DIAGNOSIS — Z23 Encounter for immunization: Secondary | ICD-10-CM | POA: Diagnosis not present

## 2015-04-04 DIAGNOSIS — E780 Pure hypercholesterolemia: Secondary | ICD-10-CM | POA: Diagnosis not present

## 2015-04-04 DIAGNOSIS — R270 Ataxia, unspecified: Secondary | ICD-10-CM | POA: Diagnosis not present

## 2015-04-04 DIAGNOSIS — I712 Thoracic aortic aneurysm, without rupture: Secondary | ICD-10-CM | POA: Diagnosis not present

## 2015-04-04 DIAGNOSIS — K219 Gastro-esophageal reflux disease without esophagitis: Secondary | ICD-10-CM | POA: Diagnosis not present

## 2015-04-04 DIAGNOSIS — Z0000 Encounter for general adult medical examination without abnormal findings: Secondary | ICD-10-CM | POA: Diagnosis not present

## 2015-04-04 DIAGNOSIS — Z950 Presence of cardiac pacemaker: Secondary | ICD-10-CM | POA: Diagnosis not present

## 2015-05-17 ENCOUNTER — Other Ambulatory Visit: Payer: Self-pay | Admitting: Cardiovascular Disease

## 2015-05-17 NOTE — Telephone Encounter (Signed)
Rx(s) sent to pharmacy electronically.  

## 2015-06-05 ENCOUNTER — Telehealth: Payer: Self-pay | Admitting: Internal Medicine

## 2015-06-05 ENCOUNTER — Ambulatory Visit: Payer: Medicare Other | Attending: Family Medicine | Admitting: Rehabilitative and Restorative Service Providers"

## 2015-06-05 VITALS — BP 169/99 | HR 59

## 2015-06-05 DIAGNOSIS — R269 Unspecified abnormalities of gait and mobility: Secondary | ICD-10-CM | POA: Insufficient documentation

## 2015-06-05 DIAGNOSIS — R262 Difficulty in walking, not elsewhere classified: Secondary | ICD-10-CM | POA: Insufficient documentation

## 2015-06-05 NOTE — Telephone Encounter (Signed)
Pt called with BP readings 150-160s systoilc over 90 diastolic. Reported mild pressure about his ears the last few days. No shortness of breath, no chest pain, good appetite, no nausea no vomiting. I recommended to take 5 mg of amlodipine 1 now, recheck blood pressure in the morning and call Dr.Croitoru's office in the morning for further recommendations.

## 2015-06-06 ENCOUNTER — Telehealth: Payer: Self-pay | Admitting: Cardiovascular Disease

## 2015-06-06 NOTE — Telephone Encounter (Signed)
Spoke with pt, his bp today at 10 am, about 30 min after taking his meds was 141/90, 133/88 and 129/86. He ate breakfast and now bp is 146/89, 136/84 and 142/85. He also wanted to let dr croitoru know about three weeks ago after eating out, when walking to the car he developed a cramp in his right thigh. He had trouble walking, he sat down, got sweaty and vonited all the food he had eaten. After vomiting he felt fine, he had worked very hard in his yard that day. Today and since then he has felt fine. Will make dr croitoru aware.

## 2015-06-06 NOTE — Telephone Encounter (Signed)
Could you please follow up on him?

## 2015-06-06 NOTE — Telephone Encounter (Signed)
Don't think we'll make any changes in his medicines at this point. Be cautious with salty foods please.

## 2015-06-06 NOTE — Telephone Encounter (Signed)
This message from [employer]: This message came from last night.-The pt blood pressure is very high,in the morning it was 169/95 and in the evening it was 157/97. Please call,need to speak to a doctor or nurse.

## 2015-06-06 NOTE — Telephone Encounter (Signed)
Spoke with pt, aware of dr croitoru's recommendations.  

## 2015-06-06 NOTE — Telephone Encounter (Signed)
Spoke with pt wife, he took an extra amlodipine last night. Prior to bed last night his bp was 152/91, 145/89 and 136/87. Patient is currently still in bed, he will call once he gets up.

## 2015-06-06 NOTE — Therapy (Signed)
[facility] [address] [place], [place], [address] Phone: [phone]   Fax:  [phone]  Physical Therapy Evaluation  Patient Details  Name: Gary [name] MRN: [id number] Date of Birth: [birth year] Referring Provider:  [name], MD  Encounter Date: 06/05/2015      PT End of Session - 06/05/15 1315    Visit Number 1   Number of Visits 8   Date for PT Re-Evaluation 07/05/15   Authorization Type G code every 10th visit   PT Start Time 1015   PT Stop Time 1105   PT Time Calculation (min) 50 min   Activity Tolerance Patient tolerated treatment well   Behavior During Therapy [facility] for tasks assessed/performed      Past Medical History  Diagnosis Date  . Hypertension   . Bradycardia     permanent pacemaker 11/22/08 St.Jude  . Hyperlipidemia   . Ascending aortic aneurysm 01/13/2014    stable at 4.7 cm diameter since 2009  . Asystole x2, s/p pacemaker 01/13/2014  . Pacemaker - leads 1987, last generator 2009 St. Jude dual-chamber 01/13/2014    Atrial lead model 433-01, ventricular lead model 431-01 implanted November 1987 Right ventricular lead dysfunction with low impedance and mediocre sensing but satisfactory pacing threshold  . Pacemaker lead malfunction 01/13/2014    Chronic low impedance and poor sensing of the ventricular lead  . HTN (hypertension) 01/13/2014    Past Surgical History  Procedure Laterality Date  . Permanent pacemaker insertion  03/04/97    ST. Jude  . Permanent pacemaker generator change  11/22/08    ST. Jude  . US echocardiography  05/16/11    AS mild to mod.,TR mild to mod., EF => 55%.    Filed Vitals:   06/05/15 1315  BP: 169/99  Pulse: 59    Visit Diagnosis:  Abnormality of gait      Subjective Assessment - 06/05/15 1018    Subjective The patient is known to our clinic from prior physical therapy services s/p bilateral vestibular loss s/p gentamycin toxicity.  He presents  today reporting he stopped doing tai chi classes 6-7 months ago.  He reports he notes his gait has slowed in speed in past few months.  His wife reports he is unable to move faster.   The patient reported an episode in the past couple of weeks in which he couldn't move his right leg, had sweating, nausea/vomitting, and needed assist to walk.  This lasted x minutes.    Patient is accompained by: Family member  spouse   Pertinent History Patient does yard work multiple hours ago.   Pt uses rails for stair negotiation, but is slow in speed.   Patient Stated Goals Walking faster, increasing comfort level with activity.    Currently in Pain? No/denies            [facility] PT Assessment - 06/05/15 1025    Assessment   Medical Diagnosis ataxia   Prior Therapy known to our clinic   Precautions   Precautions Fall   Balance Screen   Has the patient fallen in the past 6 months No   [place] residence   Living Arrangements Spouse/significant other   Type of [place] to enter   Home Layout Two level   Alternate Level Stairs-Number of Steps --  15   Alternate Level Stairs-Rails Can reach both   [employer] - single  point  patient uses to get up in middle of night.   Ambulation/Gait   Ambulation/Gait Yes   Ambulation/Gait Assistance 7: Independent   Ambulation Distance (Feet) 400 Feet   Gait Pattern --  wide base of support, occasional R veering   Ambulation Surface Level   Gait velocity 2.34 ft/sec   Stairs Yes   Stairs Assistance 7: Independent   Stair Management Technique No rails;Alternating pattern;Step to pattern  patient reports using both (does step to pattern when tired)   Number of Stairs --  8   Gait Comments 3 minute walk test=475 ft    Standardized Balance Assessment   Standardized Balance Assessment Berg Balance Test;Dynamic Gait Index   Balance Master Testing --   Berg Balance Test   Sit to Stand Able to  stand without using hands and stabilize independently   Standing Unsupported Able to stand safely 2 minutes   Sitting with Back Unsupported but Feet Supported on Floor or Stool Able to sit safely and securely 2 minutes   Stand to Sit Sits safely with minimal use of hands   Transfers Able to transfer safely, minor use of hands   Standing Unsupported with Eyes Closed Able to stand 10 seconds safely   Standing Ubsupported with Feet Together Able to place feet together independently and stand 1 minute safely   From Standing, Reach Forward with Outstretched Arm Can reach confidently >25 cm (10")   From Standing Position, Pick up Object from Floor Able to pick up shoe safely and easily   From Standing Position, Turn to Look Behind Over each Shoulder Looks behind from both sides and weight shifts well   Turn 360 Degrees Able to turn 360 degrees safely in 4 seconds or less   Standing Unsupported, Alternately Place Feet on Step/Stool Able to stand independently and safely and complete 8 steps in 20 seconds   Standing Unsupported, One Foot in [employer] balance while stepping or standing   Standing on One Leg Tries to lift leg/unable to hold 3 seconds but remains standing independently   Total Score 49   Dynamic Gait Index   Level Surface Mild Impairment   Change in Gait Speed Mild Impairment   Gait with Horizontal Head Turns Moderate Impairment   Gait with Vertical Head Turns Moderate Impairment   Gait and Pivot Turn Mild Impairment   Step Over Obstacle Normal   Step Around Obstacles Normal   Steps Normal   Total Score 17           PT Short Term Goals - 06/05/15 2141    PT SHORT TERM GOAL #1   Title The patient will be independent with home walking program and high level balance program.   Baseline Target date 07/04/2015   Time 4   Period Weeks   PT SHORT TERM GOAL #2   Title The patient will improve single limb stance time to 3 seconds bilateral LEs without UE support.   Baseline  Target date 07/04/2015   Time 4   Period Weeks   PT SHORT TERM GOAL #3   Title The patient will improve DGI from 17/24 up to 19/24 to demo decreased risk for fall.   Baseline Target date 07/04/2015   Time 4   Period Weeks   PT SHORT TERM GOAL #4   Title The patient will improve gait speed from 2.34 ft/sec up to 2.62 ft/sec to dem increased gait speed to "full community ambulator" classification of gait.   Baseline  Target date 07/04/2015   Time 4   Period Weeks           PT Long Term Goals - 06/05/15 1316    PT LONG TERM GOAL #1   Title The patient will be indep with HEP progression for post d/c.   Baseline Target date 08/03/2015   Time 8   Period Weeks   PT LONG TERM GOAL #2   Title The patient will improve Berg from 48/56 up to 52/56 to demo improving high level balance.   Baseline Target date 08/03/2015   Time 8   Period Weeks   PT LONG TERM GOAL #3   Title The patient will improve gait speed from 2.34 ft/sec up to > or equal to 2.9 ft/sec for improved mobility.   Baseline Target date 08/03/2015   Time 8   Period Weeks               Plan - 06/05/15 2155    Clinical Impression Statement The patient is an 81 year old male with h/o gentamycin toxicity (bilateral vestibular loss) with decline in gait speed and imbalance.  Patient will benefit from PT to optimize current functional status.   Pt will benefit from skilled therapeutic intervention in order to improve on the following deficits Abnormal gait;Decreased balance;Decreased mobility;Difficulty walking   Rehab Potential Good   PT Frequency 2x / week   PT Duration 4 weeks   PT Treatment/Interventions Functional mobility training;Patient/family education;Therapeutic activities;Therapeutic exercise;Balance training;Neuromuscular re-education;Gait training;Stair training;Vestibular   PT Next Visit Plan HEP: high level balance, ankle strengthening, gait training, dynamic gait   Consulted and Agree with Plan of Care  Patient;Family member/caregiver   Family Member Consulted spouse         Problem List Patient Active Problem List   Diagnosis Date Noted  . Coronary atherosclerosis 07/13/2014  . Ascending aortic aneurysm 01/13/2014  . Asystole x2, s/p pacemaker 01/13/2014  . Pacemaker - leads 1987, last generator 2009 St. Jude dual-chamber 01/13/2014  . Pacemaker lead malfunction 01/13/2014  . HTN (hypertension) 01/13/2014  . Hyperlipidemia 01/13/2014    Thank you for the referral of this patient.  [place], PT 06/06/2015, 4:45 PM  [facility] [address] [place], [place], [address] Phone: [phone]   Fax:  [phone]

## 2015-06-09 ENCOUNTER — Ambulatory Visit: Payer: Medicare Other | Admitting: Rehabilitative and Restorative Service Providers"

## 2015-06-09 VITALS — BP 160/80

## 2015-06-09 DIAGNOSIS — R262 Difficulty in walking, not elsewhere classified: Secondary | ICD-10-CM | POA: Diagnosis not present

## 2015-06-09 DIAGNOSIS — R269 Unspecified abnormalities of gait and mobility: Secondary | ICD-10-CM | POA: Diagnosis not present

## 2015-06-09 NOTE — Patient Instructions (Signed)
Heel Raise: Bilateral (Standing)   Rise on balls of feet. Repeat __20_ times per set. Do __1__ sets per session. Do __1__ sessions per day.  http://orth.exer.us/38   Copyright  VHI. All rights reserved.  ANKLE: Dorsiflexion - Standing   Stand with upright posture. Raise toes of both feet up at same time. _20__ reps per set, _1-2__ sets per day. Hold onto a support.  Copyright  VHI. All rights reserved.  Single Leg Balance: Eyes Open  STAND NEAR COUNTERTOP FOR SUPPORT.  Stand on right leg with eyes open. Hold _10__ seconds, then switch sides _3__ reps _1-2__ times per day.  http://ggbe.exer.us/4   Copyright  VHI. All rights reserved.  Feet Together (Compliant Surface) Varied Arm Positions - Eyes Closed   Stand on compliant surface: _pillow___ with feet together and arms at your sides. Close eyes and visualize upright position. Hold__10__ seconds. Repeat __3__ times per session. Do __1-2__ sessions per day.  Copyright  VHI. All rights reserved.  Gaze Stabilization: Tip Card 1.Target must remain in focus, not blurry, and appear stationary while head is in motion. 2.Perform exercises with small head movements (45 to either side of midline). 3.Increase speed of head motion so long as target is in focus. 4.If you wear eyeglasses, be sure you can see target through lens (therapist will give specific instructions for bifocal / progressive lenses). 5.These exercises may provoke dizziness or nausea. Work through these symptoms. If too dizzy, slow head movement slightly. Rest between each exercise. 6.Exercises demand concentration; avoid distractions. 7.For safety, perform standing exercises close to a counter, wall, corner, or next to someone.  Copyright  VHI. All rights reserved.  Gaze Stabilization: Standing Feet Apart   Feet shoulder width apart, keeping eyes on target on wall _3___ feet away, tilt head down slightly and move head side to side for _30___ seconds. Repeat  while moving head up and down for _30___ seconds. Do _2-3*___ sessions per day.  Copyright  VHI. All rights reserved.

## 2015-06-09 NOTE — Therapy (Signed)
[facility] [address] [place], [place], [address] Phone: [phone]   Fax:  [phone]  Physical Therapy Treatment  Patient Details  Name: Gary [name] MRN: [id number] Date of Birth: 10/05/1932 Referring Provider:  [name], MD  Encounter Date: 06/09/2015      PT End of Session - 06/09/15 1015    Visit Number 2   Number of Visits 8   Date for PT Re-Evaluation 07/05/15   Authorization Type G code every 10th visit   PT Start Time 0933   PT Stop Time 1015   PT Time Calculation (min) 42 min   Equipment Utilized During Treatment Gait belt   Activity Tolerance Patient tolerated treatment well   Behavior During Therapy [facility] for tasks assessed/performed      Past Medical History  Diagnosis Date  . Hypertension   . Bradycardia     permanent pacemaker 11/22/08 St.Jude  . Hyperlipidemia   . Ascending aortic aneurysm 01/13/2014    stable at 4.7 cm diameter since 2009  . Asystole x2, s/p pacemaker 01/13/2014  . Pacemaker - leads 1987, last generator 2009 St. Jude dual-chamber 01/13/2014    Atrial lead model 433-01, ventricular lead model 431-01 implanted November 1987 Right ventricular lead dysfunction with low impedance and mediocre sensing but satisfactory pacing threshold  . Pacemaker lead malfunction 01/13/2014    Chronic low impedance and poor sensing of the ventricular lead  . HTN (hypertension) 01/13/2014    Past Surgical History  Procedure Laterality Date  . Permanent pacemaker insertion  03/04/97    ST. Jude  . Permanent pacemaker generator change  11/22/08    ST. Jude  . US echocardiography  05/16/11    AS mild to mod.,TR mild to mod., EF => 55%.    Filed Vitals:   06/09/15 0940 06/09/15 1013  BP: 138/82 160/80    Visit Diagnosis:  Abnormality of gait      Subjective Assessment - 06/09/15 0940    Subjective The patient reports he called MD office after seeing PT earlier this week and took  extra blood pressure pill.  Patient reports he has been monitoring BP in home.   Currently in Pain? No/denies      NEUROMUSCULAR RE-EDUCATION: Balance activities at countertop including heel/toe raises decreasing UE support, single limb stance, corner standing with eyes closed on foam Gaze x 1 viewing x horizontal and vertical planes x 30 seconds x 2 reps each with cues on ROM and speed of movement  Gait: Dynamic gait activities walking on level surfaces with SB assistance x 400+ ft distance performing slow/fast walking, horizontal and vertical head turns, starts/stops with gait.        PT Education - 06/09/15 1227    Education provided Yes   Education Details HEP: heel raises, toe raises, single limb, pillow with feet together + eyes closed, gaze x 1 viewing horiz/vertical   Person(s) Educated Patient;Spouse   Methods Explanation;Demonstration;Handout   Comprehension Verbalized understanding;Returned demonstration          PT Short Term Goals - 06/05/15 2141    PT SHORT TERM GOAL #1   Title The patient will be independent with home walking program and high level balance program.   Baseline Target date 07/04/2015   Time 4   Period Weeks   PT SHORT TERM GOAL #2   Title The patient will improve single limb stance time to 3 seconds bilateral LEs without UE support.   Baseline Target date  07/04/2015   Time 4   Period Weeks   PT SHORT TERM GOAL #3   Title The patient will improve DGI from 17/24 up to 19/24 to demo decreased risk for fall.   Baseline Target date 07/04/2015   Time 4   Period Weeks   PT SHORT TERM GOAL #4   Title The patient will improve gait speed from 2.34 ft/sec up to 2.62 ft/sec to dem increased gait speed to "full community ambulator" classification of gait.   Baseline Target date 07/04/2015   Time 4   Period Weeks           PT Long Term Goals - 06/05/15 1316    PT LONG TERM GOAL #1   Title The patient will be indep with HEP progression for post d/c.    Baseline Target date 08/03/2015   Time 8   Period Weeks   PT LONG TERM GOAL #2   Title The patient will improve Berg from 48/56 up to 52/56 to demo improving high level balance.   Baseline Target date 08/03/2015   Time 8   Period Weeks   PT LONG TERM GOAL #3   Title The patient will improve gait speed from 2.34 ft/sec up to > or equal to 2.9 ft/sec for improved mobility.   Baseline Target date 08/03/2015   Time 8   Period Weeks               Plan - 06/09/15 1227    Clinical Impression Statement The patient is challenged by gaze x 1 viewing reporting visual blurring (retinal slip).  PT recommends more frequent performance of this activity to improve vestibular adaptation and therefore, balance.   PT Next Visit Plan dynamic gait activities, high level balance, gait speed   Consulted and Agree with Plan of Care Patient;Family member/caregiver   Family Member Consulted spouse        Problem List Patient Active Problem List   Diagnosis Date Noted  . Coronary atherosclerosis 07/13/2014  . Ascending aortic aneurysm 01/13/2014  . Asystole x2, s/p pacemaker 01/13/2014  . Pacemaker - leads 1987, last generator 2009 St. Jude dual-chamber 01/13/2014  . Pacemaker lead malfunction 01/13/2014  . HTN (hypertension) 01/13/2014  . Hyperlipidemia 01/13/2014    Sesar Madewell, PT 06/09/2015, 12:28 PM  [facility] [address] [place], [place], [address] Phone: [phone]   Fax:  [phone]

## 2015-06-13 ENCOUNTER — Ambulatory Visit: Payer: Medicare Other | Admitting: Rehabilitative and Restorative Service Providers"

## 2015-06-13 VITALS — BP 118/68

## 2015-06-13 DIAGNOSIS — R269 Unspecified abnormalities of gait and mobility: Secondary | ICD-10-CM | POA: Diagnosis not present

## 2015-06-13 DIAGNOSIS — R262 Difficulty in walking, not elsewhere classified: Secondary | ICD-10-CM | POA: Diagnosis not present

## 2015-06-13 NOTE — Patient Instructions (Signed)
"  I love a Water quality scientist with right side near countertop with finger tip touch.  Close your eyes and march 10 times.  Open eyes and rest.  Repeat for 3 sets total.  http://gt2.exer.us/344   Copyright  VHI. All rights reserved.

## 2015-06-13 NOTE — Therapy (Signed)
[facility] [address] [place], [place], [address] Phone: [phone]   Fax:  [phone]  Physical Therapy Treatment  Patient Details  Name: Gary [name] MRN: [id number] Date of Birth: [birth year] Referring Provider:  [name], MD  Encounter Date: 06/13/2015      PT End of Session - 06/13/15 1406    Visit Number 3   Number of Visits 8   Date for PT Re-Evaluation 07/05/15   Authorization Type G code every 10th visit   PT Start Time 1020   PT Stop Time 1100   PT Time Calculation (min) 40 min   Equipment Utilized During Treatment Gait belt   Activity Tolerance Patient tolerated treatment well   Behavior During Therapy [facility] for tasks assessed/performed      Past Medical History  Diagnosis Date  . Hypertension   . Bradycardia     permanent pacemaker 11/22/08 St.Jude  . Hyperlipidemia   . Ascending aortic aneurysm 01/13/2014    stable at 4.7 cm diameter since 2009  . Asystole x2, s/p pacemaker 01/13/2014  . Pacemaker - leads 1987, last generator 2009 St. Jude dual-chamber 01/13/2014    Atrial lead model 433-01, ventricular lead model 431-01 implanted November 1987 Right ventricular lead dysfunction with low impedance and mediocre sensing but satisfactory pacing threshold  . Pacemaker lead malfunction 01/13/2014    Chronic low impedance and poor sensing of the ventricular lead  . HTN (hypertension) 01/13/2014    Past Surgical History  Procedure Laterality Date  . Permanent pacemaker insertion  03/04/97    ST. Jude  . Permanent pacemaker generator change  11/22/08    ST. Jude  . US echocardiography  05/16/11    AS mild to mod.,TR mild to mod., EF => 55%.    Filed Vitals:   06/13/15 1025  BP: 118/68    Visit Diagnosis:  Abnormality of gait      Subjective Assessment - 06/13/15 1026    Subjective The patient is doing HEP and progressing well.  He has limited salt in diet and notes BP lower.   Currently in Pain? No/denies      NEUROMUSCULAR RE-EDUCATION: Gaze exercises x 1 viewing in horiz and vertical planes x 30 seconds x 2 reps progressing foot position from feet apart to 1/2 tandem stance with CGA Rocker board standing with feet apart with head turns, proactive balance, reactive balance, eyes closed with CGA with occasional min A due to posterior loss of balance in parallel bars for safety Standing trunk and neck rotation for increased movement for posture and dynamic gait Corner exercises on foam with head turns and eyes closed with CGA Marching with eyes closed with SBA to CGA with right rotation  Gait: Dynamic gait activities walking on level surfaces with SB assistance x 400+ ft performing ball toss up and down and side to side and changes in speed.  Also performed gait with up/down and side to side head movements. Gait with eyes closed in small space in clinic with close supervision for safety with R veering noted        PT Education - 06/13/15 1100    Education provided Yes   Education Details Marching with eyes closed    Person(s) Educated Patient;Spouse   Methods Explanation;Demonstration;Handout   Comprehension Returned demonstration;Verbalized understanding          PT Short Term Goals - 06/05/15 2141    PT SHORT TERM GOAL #1   Title The patient will  be independent with home walking program and high level balance program.   Baseline Target date 07/04/2015   Time 4   Period Weeks   PT SHORT TERM GOAL #2   Title The patient will improve single limb stance time to 3 seconds bilateral LEs without UE support.   Baseline Target date 07/04/2015   Time 4   Period Weeks   PT SHORT TERM GOAL #3   Title The patient will improve DGI from 17/24 up to 19/24 to demo decreased risk for fall.   Baseline Target date 07/04/2015   Time 4   Period Weeks   PT SHORT TERM GOAL #4   Title The patient will improve gait speed from 2.34 ft/sec up to 2.62 ft/sec to dem  increased gait speed to "full community ambulator" classification of gait.   Baseline Target date 07/04/2015   Time 4   Period Weeks           PT Long Term Goals - 06/05/15 1316    PT LONG TERM GOAL #1   Title The patient will be indep with HEP progression for post d/c.   Baseline Target date 08/03/2015   Time 8   Period Weeks   PT LONG TERM GOAL #2   Title The patient will improve Berg from 48/56 up to 52/56 to demo improving high level balance.   Baseline Target date 08/03/2015   Time 8   Period Weeks   PT LONG TERM GOAL #3   Title The patient will improve gait speed from 2.34 ft/sec up to > or equal to 2.9 ft/sec for improved mobility.   Baseline Target date 08/03/2015   Time 8   Period Weeks               Plan - 06/13/15 1406    Clinical Impression Statement The patient is doing HEP and notes a change in gait speed. PT continuing to develop high level/challenging tasks for improved functional mobility.   PT Next Visit Plan dynamic gait activities, high level balance, gait speed   Consulted and Agree with Plan of Care Patient;Family member/caregiver   Family Member Consulted spouse        Problem List Patient Active Problem List   Diagnosis Date Noted  . Coronary atherosclerosis 07/13/2014  . Ascending aortic aneurysm 01/13/2014  . Asystole x2, s/p pacemaker 01/13/2014  . Pacemaker - leads 1987, last generator 2009 St. Jude dual-chamber 01/13/2014  . Pacemaker lead malfunction 01/13/2014  . HTN (hypertension) 01/13/2014  . Hyperlipidemia 01/13/2014    Bryssa Tones, PT 06/13/2015, 2:07 PM  [facility] [address] [place], [place], [address] Phone: [phone]   Fax:  [phone]

## 2015-06-20 ENCOUNTER — Other Ambulatory Visit: Payer: Self-pay | Admitting: Cardiovascular Disease

## 2015-06-20 NOTE — Telephone Encounter (Signed)
Rx(s) sent to pharmacy electronically.  

## 2015-06-23 ENCOUNTER — Ambulatory Visit: Payer: Medicare Other | Attending: Family Medicine | Admitting: Rehabilitative and Restorative Service Providers"

## 2015-06-23 DIAGNOSIS — R269 Unspecified abnormalities of gait and mobility: Secondary | ICD-10-CM | POA: Diagnosis not present

## 2015-06-23 NOTE — Therapy (Signed)
[facility] [address] [place], [place], [address] Phone: [phone]   Fax:  [phone]  Physical Therapy Treatment  Patient Details  Name: Gary [name] MRN: [id number] Date of Birth: 30-Sep-1932 Referring Provider:  [name], MD  Encounter Date: 06/23/2015      PT End of Session - 06/23/15 1204    Visit Number 4   Number of Visits 8   Date for PT Re-Evaluation 07/05/15   Authorization Type G code every 10th visit   PT Start Time 1104   PT Stop Time 1152   PT Time Calculation (min) 48 min   Equipment Utilized During Treatment Gait belt   Activity Tolerance Patient tolerated treatment well   Behavior During Therapy [facility] for tasks assessed/performed      Past Medical History  Diagnosis Date  . Hypertension   . Bradycardia     permanent pacemaker 11/22/08 St.Jude  . Hyperlipidemia   . Ascending aortic aneurysm 01/13/2014    stable at 4.7 cm diameter since 2009  . Asystole x2, s/p pacemaker 01/13/2014  . Pacemaker - leads 1987, last generator 2009 St. Jude dual-chamber 01/13/2014    Atrial lead model 433-01, ventricular lead model 431-01 implanted November 1987 Right ventricular lead dysfunction with low impedance and mediocre sensing but satisfactory pacing threshold  . Pacemaker lead malfunction 01/13/2014    Chronic low impedance and poor sensing of the ventricular lead  . HTN (hypertension) 01/13/2014    Past Surgical History  Procedure Laterality Date  . Permanent pacemaker insertion  03/04/97    ST. Jude  . Permanent pacemaker generator change  11/22/08    ST. Jude  . US echocardiography  05/16/11    AS mild to mod.,TR mild to mod., EF => 55%.    There were no vitals filed for this visit.  Visit Diagnosis:  Abnormality of gait      Subjective Assessment - 06/23/15 1133    Subjective Patient doing HEP.  He reports he has lost weight since monitoring his diet.   Currently in Pain? No/denies     Gait: Dynamic gait activities walking on level surfaces with SBA assistance x 500+ ft performing horizontal and vertical head turns, marching, gait with eyes closed, heel walking, toe walking, tandem gait, 180 degree turns. DGI=20/24 21M=3.17 ft/sec   NEUROMUSCULAR RE-EDUCATION: Berg=52/56 Partial heel/toe Sidestepping with head turns Foam with eyes closed with SBA narrowing base of support Gaze x 1 viewing partial heel to toe reviewed      [facility] PT Assessment - 06/23/15 1133    Ambulation/Gait   Gait velocity 3.17 ft/sec   Standardized Balance Assessment   Standardized Balance Assessment Berg Balance Test;Dynamic Gait Index   Berg Balance Test   Sit to Stand Able to stand without using hands and stabilize independently   Standing Unsupported Able to stand safely 2 minutes   Sitting with Back Unsupported but Feet Supported on Floor or Stool Able to sit safely and securely 2 minutes   Stand to Sit Sits safely with minimal use of hands   Transfers Able to transfer safely, minor use of hands   Standing Unsupported with Eyes Closed Able to stand 10 seconds safely   Standing Ubsupported with Feet Together Able to place feet together independently and stand 1 minute safely   From Standing, Reach Forward with Outstretched Arm Can reach confidently >25 cm (10")   From Standing Position, Pick up Object from Floor Able to pick up shoe safely  and easily   From Standing Position, Turn to Look Behind Over each Shoulder Looks behind from both sides and weight shifts well   Turn 360 Degrees Able to turn 360 degrees safely in 4 seconds or less   Standing Unsupported, Alternately Place Feet on Step/Stool Able to stand independently and safely and complete 8 steps in 20 seconds   Standing Unsupported, One Foot in Front Able to take small step independently and hold 30 seconds   Standing on One Leg Able to lift leg independently and hold equal to or more than 3 seconds   Total Score 52   Dynamic  Gait Index   Level Surface Normal   Change in Gait Speed Normal   Gait with Horizontal Head Turns Moderate Impairment   Gait with Vertical Head Turns Moderate Impairment   Gait and Pivot Turn Normal   Step Over Obstacle Normal   Step Around Obstacles Normal   Steps Normal   Total Score 20          PT Short Term Goals - 06/23/15 1140    PT SHORT TERM GOAL #1   Title The patient will be independent with home walking program and high level balance program.   Baseline Met on 06/23/2015   Time 4   Period Weeks   Status Achieved   PT SHORT TERM GOAL #2   Title The patient will improve single limb stance time to 3 seconds bilateral LEs without UE support.   Baseline Achieved on L LE, 2 seconds on R LE.   Time 4   Period Weeks   Status Partially Met   PT SHORT TERM GOAL #3   Title The patient will improve DGI from 17/24 up to 19/24 to demo decreased risk for fall.   Baseline Met on 06/23/2015 with patient improving 20/24.   Time 4   Period Weeks   Status Achieved   PT SHORT TERM GOAL #4   Title The patient will improve gait speed from 2.34 ft/sec up to 2.62 ft/sec to dem increased gait speed to "full community ambulator" classification of gait.   Baseline Pt met on 7/8 scoring 3.17 ft/sec.   Time 4   Period Weeks   Status Achieved           PT Long Term Goals - 06/23/15 1144    PT LONG TERM GOAL #1   Title The patient will be indep with HEP progression for post d/c.   Baseline Target date 08/03/2015   Time 8   Period Weeks   Status On-going   PT LONG TERM GOAL #2   Title The patient will improve Berg from 48/56 up to 52/56 to demo improving high level balance.   Baseline Met on 06/23/2015 scoring 52/56.   Time 8   Period Weeks   Status Achieved   PT LONG TERM GOAL #3   Title The patient will improve gait speed from 2.34 ft/sec up to > or equal to 2.9 ft/sec for improved mobility.   Baseline Pt met on 06/23/2015 improving to 3.17 ft/sec.   Time 8   Period Weeks   Status  Achieved               Plan - 06/23/15 1200    Clinical Impression Statement The patient met 3/4 STGs and 2/3 LTGs.  He notes significant improvement with therapy and exercise. PT discussed early d/c with HEP as patient is progressing quickly.  Will further discuss at next session.  PT Next Visit Plan d/c planning, gait speed (treadmill), outdoor ambulation, tandem stance, imaginary targets   Consulted and Agree with Plan of Care Patient        Problem List Patient Active Problem List   Diagnosis Date Noted  . Coronary atherosclerosis 07/13/2014  . Ascending aortic aneurysm 01/13/2014  . Asystole x2, s/p pacemaker 01/13/2014  . Pacemaker - leads 1987, last generator 2009 St. Jude dual-chamber 01/13/2014  . Pacemaker lead malfunction 01/13/2014  . HTN (hypertension) 01/13/2014  . Hyperlipidemia 01/13/2014    Yobana Culliton, PT 06/23/2015, 12:05 PM  [facility] [address] [place], [place], [address] Phone: [phone]   Fax:  [phone]

## 2015-06-27 ENCOUNTER — Ambulatory Visit: Payer: Medicare Other | Admitting: Rehabilitative and Restorative Service Providers"

## 2015-06-27 DIAGNOSIS — R269 Unspecified abnormalities of gait and mobility: Secondary | ICD-10-CM

## 2015-06-27 NOTE — Patient Instructions (Addendum)
Feet Heel-Toe "Tandem", Varied Arm Positions - Eyes Open   With eyes open, right foot directly in front of the other, arms at your side, look straight ahead at a stationary object. Hold _30___ seconds, then switch feet. Repeat __3__ times per session. Do __2__ sessions per day.  Copyright  VHI. All rights reserved.  Walking Head Turn   Standing close to a wall, walk _3__ steps while turning head to the side, then 3 steps to the other side. Touch wall if necessary to keep balance. Repeat __5__ times. Do _2___ sessions per day.  http://gt2.exer.us/535   Copyright  VHI. All rights reserved.

## 2015-06-27 NOTE — Therapy (Signed)
[facility] [address] [place], [place], [address] Phone: [phone]   Fax:  [phone]  Physical Therapy Treatment  Patient Details  Name: Gary [name] MRN: [id number] Date of Birth: June 23, 1932 Referring Provider:  [name], MD  Encounter Date: 06/27/2015      PT End of Session - 06/27/15 1439    Visit Number 5   Number of Visits 8   Date for PT Re-Evaluation 07/05/15   Authorization Type G code every 10th visit   PT Start Time 1104   PT Stop Time 1150   PT Time Calculation (min) 46 min   Equipment Utilized During Treatment Gait belt   Activity Tolerance Patient tolerated treatment well   Behavior During Therapy [facility] for tasks assessed/performed      Past Medical History  Diagnosis Date  . Hypertension   . Bradycardia     permanent pacemaker 11/22/08 [employer]Jude  . Hyperlipidemia   . Ascending aortic aneurysm 01/13/2014    stable at 4.7 cm diameter since 2009  . Asystole x2, s/p pacemaker 01/13/2014  . Pacemaker - leads 1987, last generator 2009 St. Jude dual-chamber 01/13/2014    Atrial lead model 433-01, ventricular lead model 431-01 implanted November 1987 Right ventricular lead dysfunction with low impedance and mediocre sensing but satisfactory pacing threshold  . Pacemaker lead malfunction 01/13/2014    Chronic low impedance and poor sensing of the ventricular lead  . HTN (hypertension) 01/13/2014    Past Surgical History  Procedure Laterality Date  . Permanent pacemaker insertion  03/04/97    ST. Jude  . Permanent pacemaker generator change  11/22/08    ST. Jude  . US echocardiography  05/16/11    AS mild to mod.,TR mild to mod., EF => 55%.    There were no vitals filed for this visit.  Visit Diagnosis:  Abnormality of gait      Subjective Assessment - 06/27/15 1108    Subjective Patient doing well.  He requests BP check to ensure his machine is consistent with ours.   Currently in Pain?  No/denies     BP=131/84    Gait: Ambulation on community surfaces including grassy surfaces, pinestraw, curbs, inclines/declines independently without loss of balance. Treadmill x 5 minutes up to 2.0 mph with emphasis on bilateral heel strike Dynamic gait activities walking on level surfaces with CGA assistance x 200 ft performing horizontal head turns, backwards walking, direction changes, and turns.  NEUROMUSCULAR RE-EDUCATION: Tandem stance in corner Imaginary targets discussing use of this skill when in darker environments for improved balance/steadiness      PT Education - 06/27/15 1439    Education provided Yes   Education Details HEP: gait with head turns, tandem stance   Person(s) Educated Patient;Spouse   Methods Explanation;Demonstration;Handout   Comprehension Returned demonstration;Verbalized understanding          PT Short Term Goals - 06/23/15 1140    PT SHORT TERM GOAL #1   Title The patient will be independent with home walking program and high level balance program.   Baseline Met on 06/23/2015   Time 4   Period Weeks   Status Achieved   PT SHORT TERM GOAL #2   Title The patient will improve single limb stance time to 3 seconds bilateral LEs without UE support.   Baseline Achieved on L LE, 2 seconds on R LE.   Time 4   Period Weeks   Status Partially Met   PT SHORT TERM  GOAL #3   Title The patient will improve DGI from 17/24 up to 19/24 to demo decreased risk for fall.   Baseline Met on 06/23/2015 with patient improving 20/24.   Time 4   Period Weeks   Status Achieved   PT SHORT TERM GOAL #4   Title The patient will improve gait speed from 2.34 ft/sec up to 2.62 ft/sec to dem increased gait speed to "full community ambulator" classification of gait.   Baseline Pt met on 7/8 scoring 3.17 ft/sec.   Time 4   Period Weeks   Status Achieved           PT Long Term Goals - 06/23/15 1144    PT LONG TERM GOAL #1   Title The patient will be indep with  HEP progression for post d/c.   Baseline Target date 08/03/2015   Time 8   Period Weeks   Status On-going   PT LONG TERM GOAL #2   Title The patient will improve Berg from 48/56 up to 52/56 to demo improving high level balance.   Baseline Met on 06/23/2015 scoring 52/56.   Time 8   Period Weeks   Status Achieved   PT LONG TERM GOAL #3   Title The patient will improve gait speed from 2.34 ft/sec up to > or equal to 2.9 ft/sec for improved mobility.   Baseline Pt met on 06/23/2015 improving to 3.17 ft/sec.   Time 8   Period Weeks   Status Achieved               Plan - 06/27/15 1439    Clinical Impression Statement The patient is decreasing to 1x/week for this week to continue working on high level HEP and will check remaining goals next visit.  Patient progressing well and very motivated to participate in HEP and therapy.   PT Next Visit Plan d/c planning, dynamic gait with head turns   Consulted and Agree with Plan of Care Patient   Family Member Consulted spouse        Problem List Patient Active Problem List   Diagnosis Date Noted  . Coronary atherosclerosis 07/13/2014  . Ascending aortic aneurysm 01/13/2014  . Asystole x2, s/p pacemaker 01/13/2014  . Pacemaker - leads 1987, last generator 2009 St. Jude dual-chamber 01/13/2014  . Pacemaker lead malfunction 01/13/2014  . HTN (hypertension) 01/13/2014  . Hyperlipidemia 01/13/2014    Jeric Slagel, PT 06/27/2015, 2:40 PM  [facility] [address] [place], [place], [address] Phone: [phone]   Fax:  [phone]

## 2015-06-30 ENCOUNTER — Ambulatory Visit: Payer: Medicare Other | Admitting: Rehabilitative and Restorative Service Providers"

## 2015-07-04 ENCOUNTER — Ambulatory Visit: Payer: Medicare Other | Admitting: Rehabilitative and Restorative Service Providers"

## 2015-07-04 DIAGNOSIS — R269 Unspecified abnormalities of gait and mobility: Secondary | ICD-10-CM | POA: Diagnosis not present

## 2015-07-04 NOTE — Therapy (Signed)
[facility] [address] [place], [place], [address] Phone: [phone]   Fax:  [phone]  Physical Therapy Treatment  Patient Details  Name: Gary [name] MRN: [id number] Date of Birth: July 20, 1932 Referring Provider:  [name], MD  Encounter Date: 07/04/2015      PT End of Session - 07/04/15 2245    Visit Number 6   Number of Visits 8   Date for PT Re-Evaluation 07/05/15   Authorization Type G code every 10th visit   PT Start Time 1110   PT Stop Time 1140   PT Time Calculation (min) 30 min   Equipment Utilized During Treatment Gait belt   Activity Tolerance Patient tolerated treatment well   Behavior During Therapy [facility] for tasks assessed/performed      Past Medical History  Diagnosis Date  . Hypertension   . Bradycardia     permanent pacemaker 11/22/08 [employer].Jude  . Hyperlipidemia   . Ascending aortic aneurysm 01/13/2014    stable at 4.7 cm diameter since 2009  . Asystole x2, s/p pacemaker 01/13/2014  . Pacemaker - leads 1987, last generator 2009 St. Jude dual-chamber 01/13/2014    Atrial lead model 433-01, ventricular lead model 431-01 implanted November 1987 Right ventricular lead dysfunction with low impedance and mediocre sensing but satisfactory pacing threshold  . Pacemaker lead malfunction 01/13/2014    Chronic low impedance and poor sensing of the ventricular lead  . HTN (hypertension) 01/13/2014    Past Surgical History  Procedure Laterality Date  . Permanent pacemaker insertion  03/04/97    ST. Jude  . Permanent pacemaker generator change  11/22/08    ST. Jude  . US echocardiography  05/16/11    AS mild to mod.,TR mild to mod., EF => 55%.    There were no vitals filed for this visit.  Visit Diagnosis:  Abnormality of gait      Subjective Assessment - 07/04/15 1113    Subjective Patient doing new HEP.        Gait: Dynamic gait activities walking on level and compliant surfaces with  SBA to CG assistance performing head turns horizontal/vertical, backwards walking, reading cards to R/L while walking, 180 degree turns on compliant surfaces, direction changes.  NEUROMUSCULAR RE-EDUCATION: Reviewed single limb stance Tandem stance Gait with head turns from home Marching with head turns Marching with eyes closed Ramp standing on compliant surface with CGA to min A with marching and alternating foot taps       PT Short Term Goals - 06/23/15 1140    PT SHORT TERM GOAL #1   Title The patient will be independent with home walking program and high level balance program.   Baseline Met on 06/23/2015   Time 4   Period Weeks   Status Achieved   PT SHORT TERM GOAL #2   Title The patient will improve single limb stance time to 3 seconds bilateral LEs without UE support.   Baseline Achieved on L LE, 2 seconds on R LE.   Time 4   Period Weeks   Status Partially Met   PT SHORT TERM GOAL #3   Title The patient will improve DGI from 17/24 up to 19/24 to demo decreased risk for fall.   Baseline Met on 06/23/2015 with patient improving 20/24.   Time 4   Period Weeks   Status Achieved   PT SHORT TERM GOAL #4   Title The patient will improve gait speed from 2.34 ft/sec up to  2.62 ft/sec to dem increased gait speed to "full community ambulator" classification of gait.   Baseline Pt met on 7/8 scoring 3.17 ft/sec.   Time 4   Period Weeks   Status Achieved           PT Long Term Goals - 06/23/15 1144    PT LONG TERM GOAL #1   Title The patient will be indep with HEP progression for post d/c.   Baseline Target date 08/03/2015   Time 8   Period Weeks   Status On-going   PT LONG TERM GOAL #2   Title The patient will improve Berg from 48/56 up to 52/56 to demo improving high level balance.   Baseline Met on 06/23/2015 scoring 52/56.   Time 8   Period Weeks   Status Achieved   PT LONG TERM GOAL #3   Title The patient will improve gait speed from 2.34 ft/sec up to > or equal  to 2.9 ft/sec for improved mobility.   Baseline Pt met on 06/23/2015 improving to 3.17 ft/sec.   Time 8   Period Weeks   Status Achieved               Plan - 07/04/15 2245    Clinical Impression Statement Patient with imbalance with high level dynamic gait activities of walking and looking over his shoulder. PT reviewed HEP and recommended the patient continue and plan to f/u one more visit to ensure HEP going well.    PT Next Visit Plan discharge, check HEP.   Consulted and Agree with Plan of Care Patient;Family member/caregiver   Family Member Consulted spouse        Problem List Patient Active Problem List   Diagnosis Date Noted  . Coronary atherosclerosis 07/13/2014  . Ascending aortic aneurysm 01/13/2014  . Asystole x2, s/p pacemaker 01/13/2014  . Pacemaker - leads 1987, last generator 2009 St. Jude dual-chamber 01/13/2014  . Pacemaker lead malfunction 01/13/2014  . HTN (hypertension) 01/13/2014  . Hyperlipidemia 01/13/2014    Antrone Walla, PT 07/04/2015, 10:52 PM  [facility] [address] [place], [place], [address] Phone: [phone]   Fax:  [phone]

## 2015-07-07 ENCOUNTER — Encounter: Payer: Medicare Other | Admitting: Rehabilitative and Restorative Service Providers"

## 2015-07-11 ENCOUNTER — Encounter: Payer: Medicare Other | Admitting: Rehabilitative and Restorative Service Providers"

## 2015-07-14 ENCOUNTER — Ambulatory Visit: Payer: Medicare Other | Admitting: Rehabilitative and Restorative Service Providers"

## 2015-07-14 ENCOUNTER — Encounter: Payer: Self-pay | Admitting: Rehabilitative and Restorative Service Providers"

## 2015-07-14 DIAGNOSIS — R269 Unspecified abnormalities of gait and mobility: Secondary | ICD-10-CM

## 2015-07-14 NOTE — Therapy (Signed)
[facility] [address] [place], [place], [address] Phone: [phone]   Fax:  [phone]  Physical Therapy Treatment  Patient Details  Name: Gary [name] MRN: [id number] Date of Birth: 05-30-1932 Referring Provider:  [name], MD  Encounter Date: 07/14/2015      PT End of Session - 07/14/15 1108    Visit Number 7   Number of Visits 8   Date for PT Re-Evaluation 07/05/15   Authorization Type G code every 10th visit   PT Start Time 1103   PT Stop Time 1127   PT Time Calculation (min) 24 min   Equipment Utilized During Treatment Gait belt   Activity Tolerance Patient tolerated treatment well   Behavior During Therapy [facility] for tasks assessed/performed      Past Medical History  Diagnosis Date  . Hypertension   . Bradycardia     permanent pacemaker 11/22/08 St.Jude  . Hyperlipidemia   . Ascending aortic aneurysm 01/13/2014    stable at 4.7 cm diameter since 2009  . Asystole x2, s/p pacemaker 01/13/2014  . Pacemaker - leads 1987, last generator 2009 St. Jude dual-chamber 01/13/2014    Atrial lead model 433-01, ventricular lead model 431-01 implanted November 1987 Right ventricular lead dysfunction with low impedance and mediocre sensing but satisfactory pacing threshold  . Pacemaker lead malfunction 01/13/2014    Chronic low impedance and poor sensing of the ventricular lead  . HTN (hypertension) 01/13/2014    Past Surgical History  Procedure Laterality Date  . Permanent pacemaker insertion  03/04/97    ST. Jude  . Permanent pacemaker generator change  11/22/08    ST. Jude  . US echocardiography  05/16/11    AS mild to mod.,TR mild to mod., EF => 55%.    There were no vitals filed for this visit.  Visit Diagnosis:  Abnormality of gait      Subjective Assessment - 07/14/15 1108    Subjective Patient doing exercises every other day for balance and doing his prior HEP daily.   Patient Stated Goals  Walking faster, increasing comfort level with activity.    Currently in Pain? No/denies       NEUROMUSCULAR RE-EDUCATION: Reviewed balance HEP including single limb stance, marching with eyes closed, tandem stance, heel and toe raises.  Gait: Dynamic gait with horizontal head turns independently on level surfaces Gait x 400+ ft without loss of balance on level surfaces        PT Short Term Goals - 07/14/15 1116    PT SHORT TERM GOAL #1   Title The patient will be independent with home walking program and high level balance program.   Baseline Met on 06/23/2015   Time 4   Period Weeks   Status Achieved   PT SHORT TERM GOAL #2   Title The patient will improve single limb stance time to 3 seconds bilateral LEs without UE support.   Baseline Met on 07/14/2015 with patient holding >8 seconds bilaterally.   Time 4   Period Weeks   Status Achieved   PT SHORT TERM GOAL #3   Title The patient will improve DGI from 17/24 up to 19/24 to demo decreased risk for fall.   Baseline Met on 06/23/2015 with patient improving 20/24.   Time 4   Period Weeks   Status Achieved   PT SHORT TERM GOAL #4   Title The patient will improve gait speed from 2.34 ft/sec up to 2.62 ft/sec to [facility]  increased gait speed to "full community ambulator" classification of gait.   Baseline Pt met on 7/8 scoring 3.17 ft/sec.   Time 4   Period Weeks   Status Achieved           PT Long Term Goals - 07/17/2015 1135    PT LONG TERM GOAL #1   Title The patient will be indep with HEP progression for post d/c.   Baseline Target date 08/03/2015   Time 8   Period Weeks   Status Achieved   PT LONG TERM GOAL #2   Title The patient will improve Berg from 48/56 up to 52/56 to demo improving high level balance.   Baseline Met on 06/23/2015 scoring 52/56.   Time 8   Period Weeks   Status Achieved   PT LONG TERM GOAL #3   Title The patient will improve gait speed from 2.34 ft/sec up to > or equal to 2.9 ft/sec for improved  mobility.   Baseline Pt met on 06/23/2015 improving to 3.17 ft/sec.   Time 8   Period Weeks   Status Achieved               Plan - 07/17/2015 1135    Clinical Impression Statement The patient has met all STGs and LTGs.  Patient to continue HEP after d/c to challenge balance systems due to h/o bilateral vestibular loss.   PT Next Visit Plan d/c PT   Consulted and Agree with Plan of Care Patient          G-Codes - 07/17/2015 1139    Functional Assessment Tool Used Berg=52/56, DGI 20/24.   Functional Limitation Mobility: Walking and moving around   Mobility: Walking and Moving Around Goal Status [phone]) At least 1 percent but less than 20 percent impaired, limited or restricted   Mobility: Walking and Moving Around Discharge Status [id number]) At least 1 percent but less than 20 percent impaired, limited or restricted      Problem List Patient Active Problem List   Diagnosis Date Noted  . Coronary atherosclerosis 07-16-14  . Ascending aortic aneurysm 01/13/2014  . Asystole x2, s/p pacemaker 01/13/2014  . Pacemaker - leads 1987, last generator 2009 St. Jude dual-chamber 01/13/2014  . Pacemaker lead malfunction 01/13/2014  . HTN (hypertension) 01/13/2014  . Hyperlipidemia 01/13/2014    Katheren Jimmerson, PT 07/17/15, 11:41 AM  [place] [address] [place], [place], [address] Phone: [phone]   Fax:  [phone]

## 2015-07-14 NOTE — Therapy (Signed)
[facility] [address] [place], [place], [address] Phone: [phone]   Fax:  [phone]  Patient Details  Name: Gary [name] MRN: [id number] Date of Birth: 11-22-1932 Referring Provider:  No ref. provider found  Encounter Date: 07/14/2015  PHYSICAL THERAPY DISCHARGE SUMMARY  Visits from Start of Care: 7  Current functional level related to goals / functional outcomes:     PT Long Term Goals - 07/14/15 1135    PT LONG TERM GOAL #1   Title The patient will be indep with HEP progression for post d/c.   Baseline Target date 08/03/2015   Time 8   Period Weeks   Status Achieved   PT LONG TERM GOAL #2   Title The patient will improve Berg from 48/56 up to 52/56 to demo improving high level balance.   Baseline Met on 06/23/2015 scoring 52/56.   Time 8   Period Weeks   Status Achieved   PT LONG TERM GOAL #3   Title The patient will improve gait speed from 2.34 ft/sec up to > or equal to 2.9 ft/sec for improved mobility.   Baseline Pt met on 06/23/2015 improving to 3.17 ft/sec.   Time 8   Period Weeks   Status Achieved         PT Short Term Goals - 07/14/15 1116    PT SHORT TERM GOAL #1   Title The patient will be independent with home walking program and high level balance program.   Baseline Met on 06/23/2015   Time 4   Period Weeks   Status Achieved   PT SHORT TERM GOAL #2   Title The patient will improve single limb stance time to 3 seconds bilateral LEs without UE support.   Baseline Met on 07/14/2015 with patient holding >8 seconds bilaterally.   Time 4   Period Weeks   Status Achieved   PT SHORT TERM GOAL #3   Title The patient will improve DGI from 17/24 up to 19/24 to demo decreased risk for fall.   Baseline Met on 06/23/2015 with patient improving 20/24.   Time 4   Period Weeks   Status Achieved   PT SHORT TERM GOAL #4   Title The patient will improve gait speed from 2.34 ft/sec up to 2.62 ft/sec to  dem increased gait speed to "full community ambulator" classification of gait.   Baseline Pt met on 7/8 scoring 3.17 ft/sec.   Time 4   Period Weeks   Status Achieved        Remaining deficits: Decreased high level balance, addressed through [facility] / Equipment: Home program and continuation of activities post d/c.  Plan: Patient agrees to discharge.  Patient goals were met. Patient is being discharged due to meeting the stated rehab goals.  ?????                   Nissi Doffing, PT 07/14/2015, 11:43 AM  [facility] [address] [place], [place], [address] Phone: [phone]   Fax:  [phone]

## 2015-07-14 NOTE — Therapy (Deleted)
[facility] [address] [place], [place], [address] Phone: [phone]   Fax:  [phone]  Physical Therapy Treatment  Patient Details  Name: Gary [name] MRN: [id number] Date of Birth: [birth year] Referring Provider:  No ref. provider found  Encounter Date: 07/14/2015      PT End of Session - 07/14/15 1108    Visit Number 7   Number of Visits 8   Date for PT Re-Evaluation 07/05/15   Authorization Type G code every 10th visit   PT Start Time 1103   PT Stop Time 1127   PT Time Calculation (min) 24 min   Equipment Utilized During Treatment Gait belt   Activity Tolerance Patient tolerated treatment well   Behavior During Therapy WFL for tasks assessed/performed      Past Medical History  Diagnosis Date  . Hypertension   . Bradycardia     permanent pacemaker 11/22/08 St.Jude  . Hyperlipidemia   . Ascending aortic aneurysm 01/13/2014    stable at 4.7 cm diameter since 2009  . Asystole x2, s/p pacemaker 01/13/2014  . Pacemaker - leads 1987, last generator 2009 St. Jude dual-chamber 01/13/2014    Atrial lead model 433-01, ventricular lead model 431-01 implanted November 1987 Right ventricular lead dysfunction with low impedance and mediocre sensing but satisfactory pacing threshold  . Pacemaker lead malfunction 01/13/2014    Chronic low impedance and poor sensing of the ventricular lead  . HTN (hypertension) 01/13/2014    Past Surgical History  Procedure Laterality Date  . Permanent pacemaker insertion  03/04/97    ST. Jude  . Permanent pacemaker generator change  11/22/08    ST. Jude  . US echocardiography  05/16/11    AS mild to mod.,TR mild to mod., EF => 55%.    There were no vitals filed for this visit.  Visit Diagnosis:  No diagnosis found.      Subjective Assessment - 07/14/15 1108    Subjective Patient doing exercises every other day for balance and doing his prior HEP daily.   Patient Stated Goals  Walking faster, increasing comfort level with activity.    Currently in Pain? No/denies                                   PT Short Term Goals - 07/14/15 1116    PT SHORT TERM GOAL #1   Title The patient will be independent with home walking program and high level balance program.   Baseline Met on 06/23/2015   Time 4   Period Weeks   Status Achieved   PT SHORT TERM GOAL #2   Title The patient will improve single limb stance time to 3 seconds bilateral LEs without UE support.   Baseline Met on 07/14/2015 with patient holding >8 seconds bilaterally.   Time 4   Period Weeks   Status Achieved   PT SHORT TERM GOAL #3   Title The patient will improve DGI from 17/24 up to 19/24 to demo decreased risk for fall.   Baseline Met on 06/23/2015 with patient improving 20/24.   Time 4   Period Weeks   Status Achieved   PT SHORT TERM GOAL #4   Title The patient will improve gait speed from 2.34 ft/sec up to 2.62 ft/sec to dem increased gait speed to "full community ambulator" classification of gait.   Baseline Pt met on 7/8 scoring 3.17 ft/sec.  Time 4   Period Weeks   Status Achieved           PT Long Term Goals - 08/12/2015 1135    PT LONG TERM GOAL #1   Title The patient will be indep with HEP progression for post d/c.   Baseline Target date 08/03/2015   Time 8   Period Weeks   Status Achieved   PT LONG TERM GOAL #2   Title The patient will improve Berg from 48/56 up to 52/56 to demo improving high level balance.   Baseline Met on 06/23/2015 scoring 52/56.   Time 8   Period Weeks   Status Achieved   PT LONG TERM GOAL #3   Title The patient will improve gait speed from 2.34 ft/sec up to > or equal to 2.9 ft/sec for improved mobility.   Baseline Pt met on 06/23/2015 improving to 3.17 ft/sec.   Time 8   Period Weeks   Status Achieved               Plan - 2015/08/12 1135    Clinical Impression Statement The patient has met all STGs and LTGs.  Patient to  continue HEP after d/c to challenge balance systems due to h/o bilateral vestibular loss.   PT Next Visit Plan d/c PT   Consulted and Agree with Plan of Care Patient          G-Codes - 08-12-15 1139    Functional Assessment Tool Used Berg=52/56, DGI 20/24.   Functional Limitation Mobility: Walking and moving around   Mobility: Walking and Moving Around Goal Status [phone]) At least 1 percent but less than 20 percent impaired, limited or restricted   Mobility: Walking and Moving Around Discharge Status [phone]) At least 1 percent but less than 20 percent impaired, limited or restricted      Problem List Patient Active Problem List   Diagnosis Date Noted  . Coronary atherosclerosis August 11, 2014  . Ascending aortic aneurysm 01/13/2014  . Asystole x2, s/p pacemaker 01/13/2014  . Pacemaker - leads 1987, last generator 2009 St. Jude dual-chamber 01/13/2014  . Pacemaker lead malfunction 01/13/2014  . HTN (hypertension) 01/13/2014  . Hyperlipidemia 01/13/2014    Leray Garverick, PT 08/12/2015, 11:42 AM  [facility] [address] [place], [place], [address] Phone: [phone]   Fax:  [phone]

## 2015-08-01 ENCOUNTER — Other Ambulatory Visit: Payer: Self-pay | Admitting: Cardiovascular Disease

## 2015-08-01 NOTE — Telephone Encounter (Signed)
Rx(s) sent to pharmacy electronically.  

## 2015-08-08 ENCOUNTER — Ambulatory Visit (INDEPENDENT_AMBULATORY_CARE_PROVIDER_SITE_OTHER): Payer: Medicare Other | Admitting: *Deleted

## 2015-08-08 DIAGNOSIS — I469 Cardiac arrest, cause unspecified: Secondary | ICD-10-CM

## 2015-08-08 NOTE — Patient Instructions (Signed)
Your physician recommends that you follow-up with Dr.Croitoru on 11/20/2015 @ 3:45pm.

## 2015-08-11 LAB — CUP PACEART INCLINIC DEVICE CHECK
Battery Impedance: 1700 Ohm
Battery Voltage: 2.79 V
Date Time Interrogation Session: 20160823205757
Lead Channel Impedance Value: 200 Ohm — CL
Lead Channel Impedance Value: 228 Ohm
Lead Channel Pacing Threshold Amplitude: 0.75 V
Lead Channel Pacing Threshold Amplitude: 1.5 V
Lead Channel Pacing Threshold Pulse Width: 0.5 ms
Lead Channel Pacing Threshold Pulse Width: 0.5 ms
Lead Channel Sensing Intrinsic Amplitude: 2.1 mV
Lead Channel Sensing Intrinsic Amplitude: 2.3 mV
Lead Channel Setting Pacing Amplitude: 2 V
Lead Channel Setting Pacing Amplitude: 3 V
Lead Channel Setting Pacing Pulse Width: 0.5 ms
Lead Channel Setting Sensing Sensitivity: 1 mV
Pulse Gen Model: 5826
Pulse Gen Serial Number: 2228740

## 2015-08-11 NOTE — Progress Notes (Signed)
Pacemaker check in clinic. Normal device function. Thresholds, sensing, impedances consistent with previous measurements. Device programmed to maximize longevity. (38) mode switches (<1%)---max dur. 18 sec, Max A 307---no EGM, unipolar lead. No high ventricular rates noted. Device programmed at appropriate safety margins. Histogram distribution appropriate for patient activity level. Device programmed to optimize intrinsic conduction. Estimated longevity 1.5-4.75 years. Patient will follow up with [facility] in 3 months.

## 2015-08-17 ENCOUNTER — Encounter: Payer: Self-pay | Admitting: *Deleted

## 2015-08-28 ENCOUNTER — Other Ambulatory Visit: Payer: Self-pay | Admitting: Cardiovascular Disease

## 2015-08-28 NOTE — Telephone Encounter (Signed)
Rx request sent to pharmacy.  

## 2015-09-08 ENCOUNTER — Encounter: Payer: Self-pay | Admitting: Cardiovascular Disease

## 2015-09-29 ENCOUNTER — Telehealth: Payer: Self-pay | Admitting: Internal Medicine

## 2015-09-29 NOTE — Telephone Encounter (Signed)
On Call Cardiology  Gary [name] called. He reported that by mistake he took his BP meds (Amlodipine 5 mg, Toprol XL 50 mg) tonight as well after already taking them in the morning. Denies any symptoms. His BP at this time is 794 systolic and heart rate 76 bpm by home monitor.   He was instructed to check his BP in the am and if above 801 systolic then he can continue his meds as normal (he takes all his BP meds in the morning). If his BP is low or any concerning signs or symptoms, then he will call us back.   Wandra Mannan, MD

## 2015-09-30 ENCOUNTER — Telehealth: Payer: Self-pay | Admitting: Physician Assistant

## 2015-09-30 NOTE — Telephone Encounter (Signed)
Patient brought to Dr. Sallyanne Kuster with history of hypertension, hyperlipidemia, currently on pacemaker. Take metoprolol XL, enalapril and amlodipine at home. The patient, he usually take his blood pressure medication in the morning, however yesterday, he took all blood pressure medication both in the morning and at night. Note his current blood pressure is in the 120s. I have instructed him to resume his previous dose tomorrow morning.  Hilbert Corrigan PA Pager: [phone]

## 2015-10-10 DIAGNOSIS — Z23 Encounter for immunization: Secondary | ICD-10-CM | POA: Diagnosis not present

## 2015-11-20 ENCOUNTER — Encounter: Payer: Self-pay | Admitting: Cardiovascular Disease

## 2015-11-20 ENCOUNTER — Ambulatory Visit (INDEPENDENT_AMBULATORY_CARE_PROVIDER_SITE_OTHER): Payer: Medicare Other | Admitting: Cardiovascular Disease

## 2015-11-20 VITALS — BP 149/83 | HR 58 | Ht 65.25 in | Wt 169.9 lb

## 2015-11-20 DIAGNOSIS — I469 Cardiac arrest, cause unspecified: Secondary | ICD-10-CM | POA: Diagnosis not present

## 2015-11-20 DIAGNOSIS — E785 Hyperlipidemia, unspecified: Secondary | ICD-10-CM | POA: Diagnosis not present

## 2015-11-20 DIAGNOSIS — Z79899 Other long term (current) drug therapy: Secondary | ICD-10-CM | POA: Diagnosis not present

## 2015-11-20 DIAGNOSIS — I2510 Atherosclerotic heart disease of native coronary artery without angina pectoris: Secondary | ICD-10-CM | POA: Diagnosis not present

## 2015-11-20 NOTE — Progress Notes (Signed)
Patient ID: Gary [name], male   DOB: July 18, 1932, 81 y.o.   MRN: [id number]      Cardiology Office Note   Date:  11/21/2015   ID:  Gary [name], DOB 12-06-1932, MRN [id number]  PCP:  [name], MD  Cardiologist:   Sanda Klein, MD   Chief Complaint  Patient presents with  . Follow-up    pt states no chest pain no SOB no edema no light headedness or dizziness      History of Present Illness: Gary [name] is a 81 y.o. male who presents for  Follow-up for hypertension, hyperlipidemia and a pacemaker check, ascending aortic aneurysm stable in size.  He feels great and has no complaints of dyspnea, angina, edema, claudication, syncope, palpitations or focal neurological complaints.  Interrogation of his pacemaker shows normal device function. He has a dual chamber St. Jude Zephyr XL that was implanted in 2009 but the leads in use are still the original unipolar leads implanted in 1987. There is chronic low impedance and poor sensing in the ventricular lead. He virtually never requires either atrial pacing or ventricular pacing. No significant sustained episodes of atrial or ventricular tachycardia have been diagnosed. Estimated generator longevity is still around 2 years or more.  His pacemaker was implanted for episodes of asystole that occurred while he was in treatment with Thorazine and Propulsid for intractable hiccups.  Past Medical History  Diagnosis Date  . Hypertension   . Bradycardia     permanent pacemaker 11/22/08 St.Jude  . Hyperlipidemia   . Ascending aortic aneurysm 01/13/2014    stable at 4.7 cm diameter since 2009  . Asystole x2, s/p pacemaker 01/13/2014  . Pacemaker - leads 1987, last generator 2009 St. Jude dual-chamber 01/13/2014    Atrial lead model 433-01, ventricular lead model 431-01 implanted November 1987 Right ventricular lead dysfunction with low impedance and mediocre sensing but satisfactory pacing threshold  . Pacemaker lead malfunction 01/13/2014     Chronic low impedance and poor sensing of the ventricular lead  . HTN (hypertension) 01/13/2014    Past Surgical History  Procedure Laterality Date  . Permanent pacemaker insertion  03/04/97    ST. Jude  . Permanent pacemaker generator change  11/22/08    ST. Jude  . US echocardiography  05/16/11    AS mild to mod.,TR mild to mod., EF => 55%.     Current Outpatient Prescriptions  Medication Sig Dispense Refill  . amLODipine (NORVASC) 2.5 MG tablet Take 2.5 mg by mouth daily.    Marland Kitchen aspirin 81 MG tablet Take 81 mg by mouth daily.    . enalapril (VASOTEC) 20 MG tablet TAKE 1 TABLET (20 MG TOTAL) BY MOUTH DAILY. 30 tablet 5  . metoprolol succinate (TOPROL-XL) 50 MG 24 hr tablet TAKE 1 TABLET (50 MG TOTAL) BY MOUTH DAILY. TAKE WITH OR IMMEDIATELY FOLLOWING A MEAL. 30 tablet 6  . omeprazole (PRILOSEC) 20 MG capsule Take 1 capsule (20 mg total) by mouth daily. 30 capsule 5  . simvastatin (ZOCOR) 20 MG tablet TAKE 1 TABLET (20 MG TOTAL) BY MOUTH AT BEDTIME. 30 tablet 3   No current facility-administered medications for this visit.    Allergies:   Aspirin; Gentamycin; and Hydrocodone    Social History:  The patient  reports that he has never smoked. He does not have any smokeless tobacco history on file. He reports that he drinks alcohol. He reports that he does not use illicit drugs.   Family History:  The patient's  family history includes Cancer in his mother; Heart attack in his father.    ROS:  Please see the history of present illness.    Otherwise, review of systems positive for none.   All other systems are reviewed and negative.    PHYSICAL EXAM: VS:  BP 149/83 mmHg  Pulse 58  Ht 5' 5.25" (1.657 m)  Wt 169 lb 14.4 oz (77.066 kg)  BMI 28.07 kg/m2 , BMI Body mass index is 28.07 kg/(m^2).  General: Alert, oriented x3, no distress Head: no evidence of trauma, PERRL, EOMI, no exophtalmos or lid lag, no myxedema, no xanthelasma; normal ears, nose and oropharynx Neck: normal  jugular venous pulsations and no hepatojugular reflux; brisk carotid pulses without delay and no carotid bruits Chest: clear to auscultation, no signs of consolidation by percussion or palpation, normal fremitus, symmetrical and full respiratory excursions,  Healthy pacemaker site Cardiovascular: normal position and quality of the apical impulse, regular rhythm, normal first and second heart sounds, no murmurs, rubs or gallops Abdomen: no tenderness or distention, no masses by palpation, no abnormal pulsatility or arterial bruits, normal bowel sounds, no hepatosplenomegaly Extremities: no clubbing, cyanosis or edema; 2+ radial, ulnar and brachial pulses bilaterally; 2+ right femoral, posterior tibial and dorsalis pedis pulses; 2+ left femoral, posterior tibial and dorsalis pedis pulses; no subclavian or femoral bruits Neurological: grossly nonfocal Psych: euthymic mood, full affect   EKG:  EKG is ordered today. The ekg ordered today demonstrates NSR   Recent Labs: 02/09/2015: ALT 17; BUN 24*; Creat 1.00; Potassium 4.3; Sodium 139    Lipid Panel    Component Value Date/Time   CHOL 140 02/09/2015 1157   TRIG 83 02/09/2015 1157   HDL 49 02/09/2015 1157   CHOLHDL 2.9 02/09/2015 1157   VLDL 17 02/09/2015 1157   LDLCALC 74 02/09/2015 1157      Wt Readings from Last 3 Encounters:  11/20/15 169 lb 14.4 oz (77.066 kg)  02/07/15 178 lb 14.4 oz (81.149 kg)  07/12/14 176 lb 4.8 oz (79.969 kg)        ASSESSMENT AND PLAN:  Coronary atherosclerosis Asymptomatic, focus on risk factor modification. Moderately overweight, discussed diet and exercise - try to get waistline down to 34".  Pacemaker - leads 1987, last generator 2009 St. Jude dual-chamber with malfunction of the ventricular lead with very low impedance and intermittent poor sensing, suggestive of external insulation breach. Unipolar pacing and sensing appeared to remain normal. The device was changed to AAIR today. When the  device reaches elective replacement indicator I would give strong consideration to not replacing the generator. It really looks like he doesn't need a pacemaker. 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  Does not appear to have true sinus node dysfunction.  Ascending aortic aneurysm Unchanged in size for years - decreased CTA frequency to every other year. Plan a repeat CT of the chest in 2017  HTN (hypertension) Adequate control. Suspect amlodipine is responsible for his mild ankle edema.  Hyperlipidemia Excellent lipid parameters on current treatment     Current medicines are reviewed at length with the patient today.  The patient does not have concerns regarding medicines.  The following changes have been made:  no change  Labs/ tests ordered today include:  Orders Placed This Encounter  Procedures  . Lipid panel  . Comprehensive metabolic panel  . EKG 12-Lead    Patient Instructions  Your physician recommends that you return  for lab work in: 6 MONTHS PRIOR TO YOUR NEXT VISIT   Dr. Sallyanne Kuster recommends that you schedule a follow-up appointment in: 6 MONTHS        Signed, Aboubacar Matsuo, MD  11/21/2015 8:49 AM    Sanda Klein, MD, [facility] [phone] office [phone] pager

## 2015-11-20 NOTE — Patient Instructions (Signed)
Your physician recommends that you return for lab work in: 6 MONTHS PRIOR TO YOUR NEXT VISIT   Dr. Sallyanne Kuster recommends that you schedule a follow-up appointment in: 6 MONTHS

## 2015-11-21 ENCOUNTER — Encounter: Payer: Self-pay | Admitting: Cardiovascular Disease

## 2015-11-27 DIAGNOSIS — Z961 Presence of intraocular lens: Secondary | ICD-10-CM | POA: Diagnosis not present

## 2015-11-27 DIAGNOSIS — Z0100 Encounter for examination of eyes and vision without abnormal findings: Secondary | ICD-10-CM | POA: Diagnosis not present

## 2015-11-27 DIAGNOSIS — H02105 Unspecified ectropion of left lower eyelid: Secondary | ICD-10-CM | POA: Diagnosis not present

## 2015-11-27 DIAGNOSIS — H02102 Unspecified ectropion of right lower eyelid: Secondary | ICD-10-CM | POA: Diagnosis not present

## 2015-12-14 LAB — CUP PACEART INCLINIC DEVICE CHECK
Date Time Interrogation Session: 20161229151552
Implantable Lead Implant Date: 19871118
Implantable Lead Implant Date: 19871118
Implantable Lead Location: 753859
Implantable Lead Location: 753860
Implantable Lead Serial Number: 652
Lead Channel Setting Pacing Amplitude: 2 V
Lead Channel Setting Pacing Amplitude: 3 V
Lead Channel Setting Pacing Pulse Width: 0.5 ms
Lead Channel Setting Sensing Sensitivity: 1 mV
Pulse Gen Model: 5826
Pulse Gen Serial Number: 2228740

## 2015-12-25 ENCOUNTER — Other Ambulatory Visit: Payer: Self-pay | Admitting: Cardiovascular Disease

## 2015-12-25 NOTE — Telephone Encounter (Signed)
REFILL 

## 2016-01-18 ENCOUNTER — Telehealth: Payer: Self-pay | Admitting: Cardiovascular Disease

## 2016-01-18 MED ORDER — OMEPRAZOLE 20 MG PO CPDR: 20.0000 mg | DELAYED_RELEASE_CAPSULE | Freq: Every day | ORAL | Status: DC

## 2016-01-18 NOTE — Telephone Encounter (Signed)
°*  STAT* If patient is at the pharmacy, call can be transferred to refill team.   1. Which medications need to be refilled? (please list name of each medication and dose if known) Omeprazole-needs new prescription  2. Which pharmacy/location (including street and city if local pharmacy) is medication to be sent to?[phone]  3. Do they need a 30 day or 90 day supply? Not sure of the amount

## 2016-01-18 NOTE — Telephone Encounter (Signed)
Refill sent.

## 2016-02-01 ENCOUNTER — Other Ambulatory Visit: Payer: Self-pay | Admitting: Cardiovascular Disease

## 2016-02-01 NOTE — Telephone Encounter (Signed)
Rx request sent to pharmacy.  

## 2016-03-13 ENCOUNTER — Other Ambulatory Visit: Payer: Self-pay | Admitting: Cardiovascular Disease

## 2016-03-13 NOTE — Telephone Encounter (Signed)
REFILL 

## 2016-03-19 ENCOUNTER — Telehealth: Payer: Self-pay | Admitting: Cardiovascular Disease

## 2016-03-19 NOTE — Telephone Encounter (Signed)
Confirmation of instructions acknowledged.

## 2016-03-19 NOTE — Telephone Encounter (Signed)
Gary [name] is calling because he took hi blood Pressure medication ( Metoprolol ,Amodopine and a Baby Aspirin ) twice he took them once at 9am on yesterday morning and again at 12am this morning , was supposed to take his Simvastatin in which he did not take at all , because he got confused . Wants to know should he wait to take his medications at a later time today . Please call    Thanks

## 2016-03-19 NOTE — Telephone Encounter (Signed)
Returned patient call. He usually takes simvastatin before bed, he took amlodipine, metoprolol and baby ASA by mistake. Those meds he usually takes around 9am, and had done so yesterday. He realized his error in taking extra BP meds & ASA.  Advised to take simvastatin this evening, resume the other meds tomorrow AM. Informed him BP may run a little bit higher today but would not increase dosing for 1 day. Pt voiced agreement w/ this plan.  Will route to Laura. If any correction or revision to instructions, will advise patient.

## 2016-03-19 NOTE — Telephone Encounter (Signed)
Agree with assessment. 

## 2016-05-01 DIAGNOSIS — Z0000 Encounter for general adult medical examination without abnormal findings: Secondary | ICD-10-CM | POA: Diagnosis not present

## 2016-05-01 DIAGNOSIS — K219 Gastro-esophageal reflux disease without esophagitis: Secondary | ICD-10-CM | POA: Diagnosis not present

## 2016-05-01 DIAGNOSIS — E7800 Pure hypercholesterolemia, unspecified: Secondary | ICD-10-CM | POA: Diagnosis not present

## 2016-05-01 DIAGNOSIS — I712 Thoracic aortic aneurysm, without rupture: Secondary | ICD-10-CM | POA: Diagnosis not present

## 2016-05-01 DIAGNOSIS — I10 Essential (primary) hypertension: Secondary | ICD-10-CM | POA: Diagnosis not present

## 2016-05-01 DIAGNOSIS — R270 Ataxia, unspecified: Secondary | ICD-10-CM | POA: Diagnosis not present

## 2016-05-01 DIAGNOSIS — Z950 Presence of cardiac pacemaker: Secondary | ICD-10-CM | POA: Diagnosis not present

## 2016-06-06 ENCOUNTER — Other Ambulatory Visit: Payer: Self-pay | Admitting: Cardiovascular Disease

## 2016-06-06 NOTE — Telephone Encounter (Signed)
Rx(s) sent to pharmacy electronically.  

## 2016-06-12 ENCOUNTER — Telehealth: Payer: Self-pay | Admitting: Cardiovascular Disease

## 2016-06-12 ENCOUNTER — Other Ambulatory Visit: Payer: Self-pay

## 2016-06-12 MED ORDER — ENALAPRIL MALEATE 20 MG PO TABS
20.0000 mg | ORAL_TABLET | Freq: Every day | ORAL | Status: DC
Start: 2016-06-12 — End: 2016-12-30

## 2016-06-12 NOTE — Telephone Encounter (Signed)
°*  STAT* If patient is at the pharmacy, call can be transferred to refill team.   1. Which medications need to be refilled? (please list name of each medication and dose if known) Enalapril  2. Which pharmacy/location (including street and city if local pharmacy) is medication to be sent to?[phone]  3. Do they need a 30 day or 90 day supply? 30 days

## 2016-06-14 ENCOUNTER — Encounter: Payer: Self-pay | Admitting: Gastroenterology

## 2016-07-04 ENCOUNTER — Encounter: Payer: Self-pay | Admitting: Cardiovascular Disease

## 2016-07-04 ENCOUNTER — Ambulatory Visit (INDEPENDENT_AMBULATORY_CARE_PROVIDER_SITE_OTHER): Payer: Medicare Other | Admitting: Cardiovascular Disease

## 2016-07-04 ENCOUNTER — Other Ambulatory Visit: Payer: Self-pay | Admitting: Cardiovascular Disease

## 2016-07-04 ENCOUNTER — Encounter: Payer: Self-pay | Admitting: Gastroenterology

## 2016-07-04 VITALS — BP 134/76 | HR 62 | Ht 67.0 in | Wt 176.8 lb

## 2016-07-04 DIAGNOSIS — Z950 Presence of cardiac pacemaker: Secondary | ICD-10-CM | POA: Diagnosis not present

## 2016-07-04 DIAGNOSIS — I10 Essential (primary) hypertension: Secondary | ICD-10-CM

## 2016-07-04 DIAGNOSIS — I7121 Aneurysm of the ascending aorta, without rupture: Secondary | ICD-10-CM

## 2016-07-04 DIAGNOSIS — Z01812 Encounter for preprocedural laboratory examination: Secondary | ICD-10-CM

## 2016-07-04 DIAGNOSIS — I712 Thoracic aortic aneurysm, without rupture: Secondary | ICD-10-CM | POA: Diagnosis not present

## 2016-07-04 DIAGNOSIS — E785 Hyperlipidemia, unspecified: Secondary | ICD-10-CM

## 2016-07-04 DIAGNOSIS — I2510 Atherosclerotic heart disease of native coronary artery without angina pectoris: Secondary | ICD-10-CM

## 2016-07-04 NOTE — Patient Instructions (Addendum)
Medication Instructions: Dr Sallyanne Kuster recommends that you continue on your current medications as directed. Please refer to the Current Medication list given to you today.  Labwork: Your physician recommends that you return for lab work THE DAY BEFORE THE PROCEDURE.  Testing/Procedures: 1. CT Angiogram of the Aorta - Non-Cardiac CT Angiography (CTA), is a special type of CT scan that uses a computer to produce multi-dimensional views of major blood vessels throughout the body. In CT angiography, a contrast material is injected through an IV to help visualize the blood vessels.  Follow-up: Dr Sallyanne Kuster recommends that you schedule a follow-up appointment in 6 months. You will receive a reminder letter in the mail two months in advance. If you don't receive a letter, please call our office to schedule the follow-up appointment.  If you need a refill on your cardiac medications before your next appointment, please call your pharmacy.

## 2016-07-04 NOTE — Progress Notes (Signed)
Cardiology Office Note    Date:  07/05/2016   ID:  Gary [name], DOB [birth year], MRN [id number]  PCP:  [name], MD  Cardiologist:   Sanda Klein, MD   Chief complaint: Pacemaker follow-up  History of Present Illness:  [name] is a 82 y.o. male  for pacemaker follow-up as well as follow-up on a moderate-sized ascending aortic aneurysm, hypertension and hyperlipidemia.Marland Kitchen He remains remarkably vigorous for his age exercises 30 minutes every day and goes to work at [employer] He denies any problems of angina, dyspnea, palpitations, dizziness, leg edema, syncope or neurological issues.  Interrogation of his pacemaker shows normal device function. He has a dual chamber St. Jude Zephyr XL that was implanted in 2009 but the leads in use are still the original unipolar leads implanted in 1987. There is chronic low impedance and poor sensing in the ventricular lead. We have programmed his device AAI. He virtually never requires atrial pacing (<1%). No significant sustained episodes of atrial or ventricular tachycardia have been diagnosed. Estimated generator longevity is 3.75-6.25 years. His pacemaker was implanted for episodes of asystole that occurred while he was in treatment with Thorazine and Propulsid for intractable hiccups.  Past Medical History  Diagnosis Date  . Hypertension   . Bradycardia     permanent pacemaker 11/22/08 St.Jude  . Hyperlipidemia   . Ascending aortic aneurysm ([place]) 01/13/2014    stable at 4.7 cm diameter since 2009  . Asystole x2, s/p pacemaker 01/13/2014  . Pacemaker - leads 1987, last generator 2009 St. Jude dual-chamber 01/13/2014    Atrial lead model 433-01, ventricular lead model 431-01 implanted November 1987 Right ventricular lead dysfunction with low impedance and mediocre sensing but satisfactory pacing threshold  . Pacemaker lead malfunction 01/13/2014    Chronic low impedance and poor sensing of the ventricular lead  . HTN (hypertension)  01/13/2014    Past Surgical History  Procedure Laterality Date  . Permanent pacemaker insertion  03/04/97    ST. Jude  . Permanent pacemaker generator change  11/22/08    ST. Jude  . US echocardiography  05/16/11    AS mild to mod.,TR mild to mod., EF => 55%.    Current Medications: Outpatient Prescriptions Prior to Visit  Medication Sig Dispense Refill  . aspirin 81 MG tablet Take 81 mg by mouth daily.    . enalapril (VASOTEC) 20 MG tablet Take 1 tablet (20 mg total) by mouth daily. 30 tablet 6  . metoprolol succinate (TOPROL-XL) 50 MG 24 hr tablet TAKE 1 TABLET BY MOUTH EVERY DAY WITH A MEAL 30 tablet 5  . simvastatin (ZOCOR) 20 MG tablet TAKE 1 TABLET (20 MG TOTAL) BY MOUTH AT BEDTIME. 30 tablet 6  . amLODipine (NORVASC) 5 MG tablet TAKE 1 TABLET BY MOUTH EVERY DAY 30 tablet 3  . omeprazole (PRILOSEC) 20 MG capsule Take 1 capsule (20 mg total) by mouth daily. 30 capsule 5   No facility-administered medications prior to visit.     Allergies:   Aspirin; Gentamycin; and Hydrocodone   Social History   Social History  . Marital Status: Married    Spouse Name: N/A  . Number of Children: N/A  . Years of Education: N/A   Social History Main Topics  . Smoking status: Never Smoker   . Smokeless tobacco: None  . Alcohol Use: Yes     Comment: occas.  . Drug Use: No  . Sexual Activity: Not Asked   Other Topics Concern  .  None   Social History Narrative     Family History:  The patient's family history includes Cancer in his mother; Heart attack in his father.   ROS:   Please see the history of present illness.    ROS All other systems reviewed and are negative.   PHYSICAL EXAM:   VS:  BP 134/76 mmHg  Pulse 62  Ht 5\' 7"  (1.702 m)  Wt 80.196 kg (176 lb 12.8 oz)  BMI 27.68 kg/m2   GEN: Well nourished, well developed, in no acute distress HEENT: normal Neck: no JVD, carotid bruits, or masses Cardiac: RRR; no murmurs, rubs, or gallops,no edema , healthy pacemaker  site Respiratory:  clear to auscultation bilaterally, normal work of breathing GI: soft, nontender, nondistended, + BS MS: no deformity or atrophy Skin: warm and dry, no rash Neuro:  Alert and Oriented x 3, Strength and sensation are intact Psych: euthymic mood, full affect  Wt Readings from Last 3 Encounters:  07/04/16 80.196 kg (176 lb 12.8 oz)  11/20/15 77.066 kg (169 lb 14.4 oz)  02/07/15 81.149 kg (178 lb 14.4 oz)      Studies/Labs Reviewed:   EKG:  EKG is ordered today.  The ekg ordered today demonstrates Normal sinus rhythm, normal ECG, QTC 428 ms  Recent Labs: Performed by Dr. Brigitte Pulse recently, I don't yet have those results  Lipid Panel    Component Value Date/Time   CHOL 140 02/09/2015 1157   TRIG 83 02/09/2015 1157   HDL 49 02/09/2015 1157   CHOLHDL 2.9 02/09/2015 1157   VLDL 17 02/09/2015 1157   LDLCALC 74 02/09/2015 1157      ASSESSMENT:    1. Ascending aortic aneurysm ([place])   2. Atherosclerosis of native coronary artery of native heart without angina pectoris   3. Pacemaker - leads 1987, last generator 2009 St. Jude dual-chamber   4. Essential hypertension   5. Hyperlipidemia   6. Pre-procedure lab exam      PLAN:  In order of problems listed above:  1. Ascending aortic aneurysm Unchanged in size for years - decreased CTA frequency to every other year, he is due a scan now.  2. Coronary atherosclerosis Asymptomatic, focus on risk factor modification. Mildlly overweight, discussed diet and exercise - try to get waistline down to 34".  3. Pacemaker - leads 1987, last generator 2009 St. Jude dual-chamber with malfunction of the ventricular lead with very low impedance and intermittent poor sensing, suggestive of external insulation breach. Unipolar pacing and sensing appeared to remain normal. The device was changed to AAIR at his last visit. When the device reaches elective replacement indicator I would give strong consideration to not replacing the  generator. It really looks like he doesn't need a pacemaker. 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 Does not appear to have true sinus node dysfunction.  4. HTN (hypertension) Adequate control.   5. Hyperlipidemia  Good lipid parameters on current treatment    Medication Adjustments/Labs and Tests Ordered: Current medicines are reviewed at length with the patient today.  Concerns regarding medicines are outlined above.  Medication changes, Labs and Tests ordered today are listed in the Patient Instructions below. Patient Instructions  Medication Instructions: Dr Sallyanne Kuster recommends that you continue on your current medications as directed. Please refer to the Current Medication list given to you today.  Labwork: Your physician recommends that you return for lab work THE DAY BEFORE THE PROCEDURE.  Testing/Procedures:  1. CT Angiogram of the Aorta - Non-Cardiac CT Angiography (CTA), is a special type of CT scan that uses a computer to produce multi-dimensional views of major blood vessels throughout the body. In CT angiography, a contrast material is injected through an IV to help visualize the blood vessels.  Follow-up: Dr Sallyanne Kuster recommends that you schedule a follow-up appointment in 6 months. You will receive a reminder letter in the mail two months in advance. If you don't receive a letter, please call our office to schedule the follow-up appointment.  If you need a refill on your cardiac medications before your next appointment, please call your pharmacy.     Signed, Sanda Klein, MD  07/05/2016 6:24 PM    [facility], [place], [place]  [address] Phone: [phone]; Fax: [phone]

## 2016-07-05 ENCOUNTER — Telehealth: Payer: Self-pay | Admitting: Cardiovascular Disease

## 2016-07-05 ENCOUNTER — Encounter: Payer: Self-pay | Admitting: *Deleted

## 2016-07-05 NOTE — Telephone Encounter (Signed)
Left message to call back  

## 2016-07-05 NOTE — Telephone Encounter (Signed)
New Message:   Pt saw Dr Loletha Grayer yesterday,he wants to know why CT was ordered after Dr C said he did not need to schedule one? Also he is scheduled to have blood work Thursday and PSA another day at his Urinologist. He says will this matter have all this blood work the same week?

## 2016-07-08 DIAGNOSIS — R3915 Urgency of urination: Secondary | ICD-10-CM | POA: Diagnosis not present

## 2016-07-08 NOTE — Telephone Encounter (Signed)
Spoke with pt, questions regarding lab work prior to CT scan answered.

## 2016-07-10 DIAGNOSIS — I712 Thoracic aortic aneurysm, without rupture: Secondary | ICD-10-CM | POA: Diagnosis not present

## 2016-07-10 DIAGNOSIS — Z01812 Encounter for preprocedural laboratory examination: Secondary | ICD-10-CM | POA: Diagnosis not present

## 2016-07-11 ENCOUNTER — Ambulatory Visit (INDEPENDENT_AMBULATORY_CARE_PROVIDER_SITE_OTHER)
Admission: RE | Admit: 2016-07-11 | Discharge: 2016-07-11 | Disposition: A | Discharge status: Home / Self Care | Payer: Medicare Other | Source: Ambulatory Visit | Attending: Cardiovascular Disease | Admitting: Cardiovascular Disease

## 2016-07-11 DIAGNOSIS — I714 Abdominal aortic aneurysm, without rupture: Secondary | ICD-10-CM | POA: Diagnosis not present

## 2016-07-11 DIAGNOSIS — I7121 Aneurysm of the ascending aorta, without rupture: Secondary | ICD-10-CM

## 2016-07-11 DIAGNOSIS — I712 Thoracic aortic aneurysm, without rupture: Secondary | ICD-10-CM | POA: Diagnosis not present

## 2016-07-11 LAB — BASIC METABOLIC PANEL
BUN: 21 mg/dL (ref 7–25)
CO2: 24 mmol/L (ref 20–31)
Calcium: 8.7 mg/dL (ref 8.6–10.3)
Chloride: 105 mmol/L (ref 98–110)
Creat: 0.99 mg/dL (ref 0.70–1.11)
Glucose, Bld: 78 mg/dL (ref 65–99)
Potassium: 4.4 mmol/L (ref 3.5–5.3)
Sodium: 139 mmol/L (ref 135–146)

## 2016-07-11 MED ORDER — IOPAMIDOL (ISOVUE-370) INJECTION 76%
100.0000 mL | Freq: Once | INTRAVENOUS | Status: AC | PRN
  Administered 2016-07-11: 80 mL via INTRAVENOUS

## 2016-07-16 LAB — CUP PACEART INCLINIC DEVICE CHECK
Battery Remaining Longevity: 47 mo
Battery Voltage: 2.79 V
Date Time Interrogation Session: 20170801101804
Implantable Lead Implant Date: 19871118
Implantable Lead Implant Date: 19871118
Implantable Lead Location: 753859
Implantable Lead Location: 753860
Implantable Lead Serial Number: 652
Lead Channel Setting Pacing Amplitude: 2 V
Pulse Gen Model: 5826
Pulse Gen Serial Number: 2228740

## 2016-07-17 ENCOUNTER — Telehealth: Payer: Self-pay | Admitting: Cardiovascular Disease

## 2016-07-17 NOTE — Telephone Encounter (Signed)
F/u Message  Pt states he is returning RN call about results from his CAT scan. Please call back to discuss

## 2016-07-17 NOTE — Telephone Encounter (Signed)
Spoke with pt, aware of CT results.

## 2016-07-18 ENCOUNTER — Other Ambulatory Visit: Payer: Self-pay | Admitting: Cardiovascular Disease

## 2016-07-19 ENCOUNTER — Encounter: Payer: Self-pay | Admitting: Cardiovascular Disease

## 2016-07-19 NOTE — Telephone Encounter (Signed)
Rx(s) sent to pharmacy electronically.  

## 2016-10-09 DIAGNOSIS — Z23 Encounter for immunization: Secondary | ICD-10-CM | POA: Diagnosis not present

## 2016-11-29 ENCOUNTER — Telehealth: Payer: Self-pay

## 2016-12-02 DIAGNOSIS — Z961 Presence of intraocular lens: Secondary | ICD-10-CM | POA: Diagnosis not present

## 2016-12-02 DIAGNOSIS — H52203 Unspecified astigmatism, bilateral: Secondary | ICD-10-CM | POA: Diagnosis not present

## 2016-12-03 ENCOUNTER — Other Ambulatory Visit: Payer: Self-pay | Admitting: Cardiovascular Disease

## 2016-12-05 NOTE — Telephone Encounter (Signed)
Received fax from patient's dentist, Dr Mikey Bussing.  Does patient require antibiotic premedications prior to dental treatment?  No indication for antibiotic premedication, per MCr.   Faxed back to Dr Geralynn Ochs.

## 2016-12-30 ENCOUNTER — Other Ambulatory Visit: Payer: Self-pay | Admitting: Cardiovascular Disease

## 2016-12-31 NOTE — Telephone Encounter (Signed)
Rx(s) sent to pharmacy electronically.  

## 2017-01-09 ENCOUNTER — Other Ambulatory Visit: Payer: Self-pay | Admitting: Cardiovascular Disease

## 2017-01-09 NOTE — Telephone Encounter (Signed)
Rx request sent to pharmacy.  

## 2017-01-18 DIAGNOSIS — J069 Acute upper respiratory infection, unspecified: Secondary | ICD-10-CM | POA: Diagnosis not present

## 2017-01-21 ENCOUNTER — Encounter: Payer: Self-pay | Admitting: Cardiovascular Disease

## 2017-01-21 ENCOUNTER — Ambulatory Visit (INDEPENDENT_AMBULATORY_CARE_PROVIDER_SITE_OTHER): Payer: Medicare Other | Admitting: Cardiovascular Disease

## 2017-01-21 VITALS — BP 136/80 | HR 61 | Ht 67.0 in | Wt 172.8 lb

## 2017-01-21 DIAGNOSIS — I469 Cardiac arrest, cause unspecified: Secondary | ICD-10-CM

## 2017-01-21 DIAGNOSIS — I10 Essential (primary) hypertension: Secondary | ICD-10-CM

## 2017-01-21 DIAGNOSIS — E7800 Pure hypercholesterolemia, unspecified: Secondary | ICD-10-CM

## 2017-01-21 DIAGNOSIS — I2510 Atherosclerotic heart disease of native coronary artery without angina pectoris: Secondary | ICD-10-CM | POA: Diagnosis not present

## 2017-01-21 DIAGNOSIS — Z950 Presence of cardiac pacemaker: Secondary | ICD-10-CM | POA: Diagnosis not present

## 2017-01-21 DIAGNOSIS — I712 Thoracic aortic aneurysm, without rupture: Secondary | ICD-10-CM | POA: Diagnosis not present

## 2017-01-21 DIAGNOSIS — I7121 Aneurysm of the ascending aorta, without rupture: Secondary | ICD-10-CM

## 2017-01-21 NOTE — Progress Notes (Signed)
Cardiology Office Note    Date:  01/21/2017   ID:  Gary [name], DOB 05-17-1932, MRN [id number]  PCP:  [name], MD  Cardiologist:   Sanda Klein, MD   Chief complaint: Pacemaker follow-up  History of Present Illness:  Gary [name] is a 83 y.o. male  for pacemaker follow-up as well as follow-up on a moderate-sized ascending aortic aneurysm, hypertension and hyperlipidemia. He remains remarkably vigorous for his age exercises 30 minutes every day and goes to work at [employer] He denies any problems with angina, dyspnea, palpitations, dizziness, leg edema, syncope or neurological issues. He noticed that when he gains some weight his blood pressure worsened and he started taking an extra 2.5 mg of amlodipine daily. Subsequently has been more careful with sodium in his diet and has lost several pounds. He plans to continue losing about another 5-10 pounds.  Interrogation of his pacemaker shows normal function of the atrial lead (P waves 2.1-2.4 mV, impedance 219 ohms, threshold 0.75 V at 0.5 ms and the unipolar configuration). His pacemaker generator has roughly 3.75-6.25 years of longevity remaining. He almost never requires atrial pacing (less than 1%) cyst. His ventricular lead has been inactivated due to over sensing and low impedance suggestive of insulation breach. He has a dual chamber St. Jude Zephyr XL that was implanted in 2009 but the leads in use are still the original unipolar leads implanted in 1987. His pacemaker was implanted for episodes of asystole that occurred while he was in treatment with Thorazine and Propulsid for intractable hiccups.  Past Medical History:  Diagnosis Date  . Ascending aortic aneurysm ([place]) 01/13/2014   stable at 4.7 cm diameter since 2009  . Asystole x2, s/p pacemaker 01/13/2014  . Bradycardia    permanent pacemaker 11/22/08 St.Jude  . HTN (hypertension) 01/13/2014  . Hyperlipidemia   . Hypertension   . Pacemaker - leads 1987, last  generator 2009 St. Jude dual-chamber 01/13/2014   Atrial lead model 433-01, ventricular lead model 431-01 implanted November 1987 Right ventricular lead dysfunction with low impedance and mediocre sensing but satisfactory pacing threshold  . Pacemaker lead malfunction 01/13/2014   Chronic low impedance and poor sensing of the ventricular lead    Past Surgical History:  Procedure Laterality Date  . PERMANENT PACEMAKER GENERATOR CHANGE  11/22/08   ST. Jude  . PERMANENT PACEMAKER INSERTION  03/04/97   ST. Jude  . US ECHOCARDIOGRAPHY  05/16/11   AS mild to mod.,TR mild to mod., EF => 55%.    Current Medications: Outpatient Medications Prior to Visit  Medication Sig Dispense Refill  . aspirin 81 MG tablet Take 81 mg by mouth daily.    . enalapril (VASOTEC) 20 MG tablet TAKE 1 TABLET(20 MG) BY MOUTH DAILY 30 tablet 1  . metoprolol succinate (TOPROL-XL) 50 MG 24 hr tablet TAKE 1 TABLET BY MOUTH EVERY DAY WITH A MEAL 30 tablet 1  . simvastatin (ZOCOR) 20 MG tablet TAKE 1 TABLET BY MOUTH EVERY NIGHT AT BEDTIME 30 tablet 10  . amLODipine (NORVASC) 5 MG tablet TAKE 1 TABLET BY MOUTH EVERY DAY 30 tablet 6   No facility-administered medications prior to visit.      Allergies:   Aspirin; Gentamycin [gentamicin sulfate]; and Hydrocodone   Social History   Social History  . Marital status: Married    Spouse name: N/A  . Number of children: N/A  . Years of education: N/A   Social History Main Topics  . Smoking status: Never Smoker  .  Smokeless tobacco: Not on file  . Alcohol use Yes     Comment: occas.  . Drug use: No  . Sexual activity: Not on file   Other Topics Concern  . Not on file   Social History Narrative  . No narrative on file     Family History:  The patient's family history includes Cancer in his mother; Heart attack in his father.   ROS:   Please see the history of present illness.    ROS All other systems reviewed and are negative.   PHYSICAL EXAM:   VS:  BP  136/80 (BP Location: Left Arm, Patient Position: Sitting, Cuff Size: Normal)   Pulse 61   Ht 5\' 7"  (1.702 m)   Wt 78.4 kg (172 lb 12.8 oz)   BMI 27.06 kg/m    GEN: Well nourished, well developed, in no acute distress  HEENT: normal  Neck: no JVD, carotid bruits, or masses Cardiac: RRR; no murmurs, rubs, or gallops,no edema , healthy pacemaker site Respiratory:  clear to auscultation bilaterally, normal work of breathing GI: soft, nontender, nondistended, + BS MS: no deformity or atrophy  Skin: warm and dry, no rash Neuro:  Alert and Oriented x 3, Strength and sensation are intact Psych: euthymic mood, full affect  Wt Readings from Last 3 Encounters:  01/21/17 78.4 kg (172 lb 12.8 oz)  07/04/16 80.2 kg (176 lb 12.8 oz)  11/20/15 77.1 kg (169 lb 14.4 oz)      Studies/Labs Reviewed:   EKG:  EKG is ordered today.  The ekg ordered today demonstrates Normal sinus rhythm, Mild first-degree AV block Francis PR 210 ms), normal ECG, QTC 436 ms  Recent Labs:   Lipid Panel    Component Value Date/Time   CHOL 140 02/09/2015 1157   TRIG 83 02/09/2015 1157   HDL 49 02/09/2015 1157   CHOLHDL 2.9 02/09/2015 1157   VLDL 17 02/09/2015 1157   LDLCALC 74 02/09/2015 1157   05/01/2016 labs from Dr. Brigitte Pulse HDL 53, LDL 76, total cholesterol 147, TG 93, glucose 78, creatinine 0.99   ASSESSMENT:    1. Ascending aortic aneurysm ([place])   2. Atherosclerosis of native coronary artery of native heart without angina pectoris   3. Asystole x2, s/p pacemaker   4. Essential hypertension   5. Pure hypercholesterolemia   6. Pacemaker      PLAN:  In order of problems listed above:  1. Ascending aortic aneurysm: Unchanged in size for years - Most recent study with CT angiogram in July 2017 when it measured 4.84.6 cm essentially unchanged. Recheck CT of the chest in July 2019. 2. Coronary atherosclerosis: Asymptomatic, focus on risk factor modification. He has managed to lose 4 pounds since last  year, continue diet and exercise - try to get waistline down to 34". 3. Pacemaker - leads 1987, last generator 2009 St. Jude dual-chamber: with malfunction of the ventricular lead with very low impedance and intermittent poor sensing, suggestive of external insulation breach.The device was changed to AAIR. When the device reaches elective replacement indicator I would give strong consideration to not replacing the generator. It really looks like he doesn't need a pacemaker. 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 Does not appear to have true sinus node dysfunction. His device is not amenable to remote follow-up, recheck in 6 months in office. 4. HTN (hypertension) Acceptable control. Continue current regimen. May be able to reduce amlodipine dose if he  loses additional weight. 5. Hyperlipidemia: With all parameters within target range    Medication Adjustments/Labs and Tests Ordered: Current medicines are reviewed at length with the patient today.  Concerns regarding medicines are outlined above.  Medication changes, Labs and Tests ordered today are listed in the Patient Instructions below. Patient Instructions  Dr Sallyanne Kuster recommends that you schedule a follow-up appointment in 6 months. You will receive a reminder letter in the mail two months in advance. If you don't receive a letter, please call our office to schedule the follow-up appointment.  If you need a refill on your cardiac medications before your next appointment, please call your pharmacy.    Signed, Sanda Klein, MD  01/21/2017 6:01 PM    [facility] [address], [place], [place]  [address] Phone: [phone]; Fax: [phone]

## 2017-01-21 NOTE — Patient Instructions (Signed)
Dr Croitoru recommends that you schedule a follow-up appointment in 6 months. You will receive a reminder letter in the mail two months in advance. If you don't receive a letter, please call our office to schedule the follow-up appointment.  If you need a refill on your cardiac medications before your next appointment, please call your pharmacy. 

## 2017-01-23 ENCOUNTER — Other Ambulatory Visit: Payer: Self-pay | Admitting: Cardiovascular Disease

## 2017-01-23 NOTE — Telephone Encounter (Signed)
Rx(s) sent to pharmacy electronically.  

## 2017-01-24 ENCOUNTER — Telehealth: Payer: Self-pay | Admitting: Cardiovascular Disease

## 2017-01-24 LAB — CUP PACEART INCLINIC DEVICE CHECK
Date Time Interrogation Session: 20180209094625
Implantable Lead Implant Date: 19871118
Implantable Lead Implant Date: 19871118
Implantable Lead Location: 753859
Implantable Lead Location: 753860
Implantable Lead Serial Number: 652
Implantable Pulse Generator Implant Date: 20091208
Lead Channel Setting Pacing Amplitude: 2 V
Pulse Gen Model: 5826
Pulse Gen Serial Number: 2228740

## 2017-01-24 NOTE — Telephone Encounter (Signed)
New message    Pt wife is calling to ask if pt needed or needs blood work done.

## 2017-01-24 NOTE — Telephone Encounter (Signed)
Returned call to wife (ok per DPR)-states patient had appointment the other day and was unsure if he needed blood work or not.    After chart review-no lab work ordered or indicated.    Made aware and verbalized understanding.

## 2017-02-27 ENCOUNTER — Other Ambulatory Visit: Payer: Self-pay | Admitting: Cardiovascular Disease

## 2017-03-28 ENCOUNTER — Other Ambulatory Visit: Payer: Self-pay | Admitting: Family Medicine

## 2017-03-28 DIAGNOSIS — M545 Low back pain: Secondary | ICD-10-CM

## 2017-03-31 ENCOUNTER — Ambulatory Visit
Admission: RE | Admit: 2017-03-31 | Discharge: 2017-03-31 | Disposition: A | Discharge status: Home / Self Care | Payer: Medicare Other | Source: Ambulatory Visit | Attending: Family Medicine | Admitting: Family Medicine

## 2017-03-31 DIAGNOSIS — M545 Low back pain: Secondary | ICD-10-CM

## 2017-04-01 DIAGNOSIS — S32020A Wedge compression fracture of second lumbar vertebra, initial encounter for closed fracture: Secondary | ICD-10-CM | POA: Diagnosis not present

## 2017-04-01 DIAGNOSIS — M545 Low back pain: Secondary | ICD-10-CM | POA: Diagnosis not present

## 2017-04-04 ENCOUNTER — Telehealth: Payer: Self-pay | Admitting: Internal Medicine

## 2017-04-04 NOTE — Telephone Encounter (Signed)
Discussed with patient's son patient's elevated BP.  Pt has significant back pain, on tramadol and noted BP 160s/100s without cp, dizziness, headaches, etc.  Encouraged to work on pain control and to call his doctor should his pain not improve and/or BP remains elevated over the next few days.  They know to seek medical help should symptoms arrise/worsen.

## 2017-04-08 DIAGNOSIS — S32020A Wedge compression fracture of second lumbar vertebra, initial encounter for closed fracture: Secondary | ICD-10-CM | POA: Diagnosis not present

## 2017-04-11 ENCOUNTER — Telehealth: Payer: Self-pay | Admitting: Internal Medicine

## 2017-04-11 NOTE — Telephone Encounter (Signed)
Called by his son due to BP concerns.  I talked to them a week ago for the same as overnight cardiology on call.  No chest pain, SOB, dizziness, etc.  Pt checked BP and elevated to 180s (checking roughly every 2 minutes with range of numbers).  Asymptomatic.  He took an extra amlodipine 1 hour ago.  Advised to avoid constant BP checks and in the absence of symptoms work on pain control (has bad back pain from compression fracture) and to start a BP log (did not check BP for last week).  He needs to call PCP Monday to set up appointment.  They know to come to ED if any concerns or symptoms.

## 2017-04-22 DIAGNOSIS — S32020D Wedge compression fracture of second lumbar vertebra, subsequent encounter for fracture with routine healing: Secondary | ICD-10-CM | POA: Diagnosis not present

## 2017-05-06 DIAGNOSIS — S32020D Wedge compression fracture of second lumbar vertebra, subsequent encounter for fracture with routine healing: Secondary | ICD-10-CM | POA: Diagnosis not present

## 2017-05-26 DIAGNOSIS — S32020D Wedge compression fracture of second lumbar vertebra, subsequent encounter for fracture with routine healing: Secondary | ICD-10-CM | POA: Diagnosis not present

## 2017-06-10 ENCOUNTER — Other Ambulatory Visit: Payer: Self-pay | Admitting: Cardiovascular Disease

## 2017-06-10 NOTE — Telephone Encounter (Signed)
REFILL 

## 2017-06-18 ENCOUNTER — Ambulatory Visit (HOSPITAL_COMMUNITY)
Admission: EM | Admit: 2017-06-18 | Discharge: 2017-06-18 | Disposition: A | Discharge status: Home / Self Care | Payer: Medicare Other | Attending: Family Medicine | Admitting: Family Medicine

## 2017-06-18 ENCOUNTER — Encounter (HOSPITAL_COMMUNITY): Payer: Self-pay | Admitting: Emergency Medicine

## 2017-06-18 DIAGNOSIS — W57XXXA Bitten or stung by nonvenomous insect and other nonvenomous arthropods, initial encounter: Secondary | ICD-10-CM | POA: Diagnosis not present

## 2017-06-18 DIAGNOSIS — R220 Localized swelling, mass and lump, head: Secondary | ICD-10-CM

## 2017-06-18 DIAGNOSIS — S00561A Insect bite (nonvenomous) of lip, initial encounter: Secondary | ICD-10-CM | POA: Diagnosis not present

## 2017-06-18 MED ORDER — DIPHENHYDRAMINE HCL 25 MG PO CAPS
50.0000 mg | ORAL_CAPSULE | Freq: Once | ORAL | Status: AC
  Administered 2017-06-18: 50 mg via ORAL

## 2017-06-18 MED ORDER — DIPHENHYDRAMINE HCL 25 MG PO CAPS
ORAL_CAPSULE | ORAL | Status: AC
  Filled 2017-06-18: qty 2

## 2017-06-18 NOTE — Discharge Instructions (Signed)
Please discuss with your doctor tomorrow your lip swelling.

## 2017-06-18 NOTE — ED Triage Notes (Signed)
Reports a stinging insect hovering, stinging sensation to area below lower lip, on right .  Lower lip swollen, left jaw swelling

## 2017-06-18 NOTE — ED Provider Notes (Signed)
CSN: [id number]     Arrival date & time 06/18/17  1847 History   None    Chief Complaint  Patient presents with  . Insect Bite   (Consider location/radiation/quality/duration/timing/severity/associated sxs/prior Treatment) Patient c/o lower lip swelling after being stung by an insect about an hour ago.   The history is provided by the patient.  Allergic Reaction  Presenting symptoms: itching and rash   Severity:  Moderate Duration:  1 hour Context: insect bite/sting   Relieved by:  Nothing Worsened by:  Nothing Ineffective treatments:  None tried   Past Medical History:  Diagnosis Date  . Ascending aortic aneurysm ([place]) 01/13/2014   stable at 4.7 cm diameter since 2009  . Asystole x2, s/p pacemaker 01/13/2014  . Bradycardia    permanent pacemaker 11/22/08 St.Jude  . HTN (hypertension) 01/13/2014  . Hyperlipidemia   . Hypertension   . Pacemaker - leads 1987, last generator 2009 St. Jude dual-chamber 01/13/2014   Atrial lead model 433-01, ventricular lead model 431-01 implanted November 1987 Right ventricular lead dysfunction with low impedance and mediocre sensing but satisfactory pacing threshold  . Pacemaker lead malfunction 01/13/2014   Chronic low impedance and poor sensing of the ventricular lead   Past Surgical History:  Procedure Laterality Date  . PERMANENT PACEMAKER GENERATOR CHANGE  11/22/08   ST. Jude  . PERMANENT PACEMAKER INSERTION  03/04/97   ST. Jude  . US ECHOCARDIOGRAPHY  05/16/11   AS mild to mod.,TR mild to mod., EF => 55%.   Family History  Problem Relation Age of Onset  . Cancer Mother   . Heart attack Father    Social History  Substance Use Topics  . Smoking status: Never Smoker  . Smokeless tobacco: Never Used  . Alcohol use Yes     Comment: occas.    Review of Systems  Constitutional: Negative.   HENT: Negative.   Eyes: Negative.   Respiratory: Negative.   Cardiovascular: Negative.   Gastrointestinal: Negative.   Endocrine: Negative.    Genitourinary: Negative.   Musculoskeletal: Negative.   Skin: Positive for itching and rash.  Allergic/Immunologic: Negative.   Neurological: Negative.   Hematological: Negative.   Psychiatric/Behavioral: Negative.     Allergies  Aspirin; Gentamycin [gentamicin sulfate]; and Hydrocodone  Home Medications   Prior to Admission medications   Medication Sig Start Date End Date Taking? Authorizing Provider  amLODipine (NORVASC) 5 MG tablet Take 7.5 mg by mouth daily.    [provider]  aspirin 81 MG tablet Take 81 mg by mouth daily.    [provider]  enalapril (VASOTEC) 20 MG tablet Take 1 tablet (20 mg total) by mouth daily. 02/27/17   Croitoru, Mihai, MD  metoprolol succinate (TOPROL-XL) 50 MG 24 hr tablet TAKE 1 TABLET BY MOUTH EVERY DAY WITH A MEAL(NEED TO SCHEDULE AN APPT.) 01/23/17   Croitoru, Dani Gobble, MD  simvastatin (ZOCOR) 20 MG tablet TAKE 1 TABLET BY MOUTH EVERY NIGHT AT BEDTIME 06/10/17   Croitoru, Mihai, MD   Meds Ordered and Administered this Visit   Medications  diphenhydrAMINE (BENADRYL) capsule 50 mg (50 mg Oral Given 06/18/17 1935)    BP (!) 141/84 (BP Location: Left Arm)   Pulse 68   Temp 97.9 F (36.6 C) (Oral)   Resp 18   SpO2 100%  No data found.   Physical Exam  Constitutional: He appears well-developed and well-nourished.  HENT:  Head: Normocephalic and atraumatic.  Eyes: Conjunctivae and EOM are normal. Pupils are equal, round, and  reactive to light.  Neck: Normal range of motion. Neck supple.  Cardiovascular: Normal rate, regular rhythm and normal heart sounds.   Pulmonary/Chest: Effort normal and breath sounds normal.  Skin: No erythema.  Nursing note and vitals reviewed.   Urgent Care Course     Procedures (including critical care time)  Labs Review Labs Reviewed - No data to display  Imaging Review No results found.   Visual Acuity Review  Right Eye Distance:   Left Eye Distance:   Bilateral Distance:    Right  Eye Near:   Left Eye Near:    Bilateral Near:         MDM   1. Insect bite, initial encounter   2. Lip swelling    Take motrin otc as directed Benadryl 50mg  po now      Lysbeth Penner, [place] 06/18/17 1956

## 2017-06-19 DIAGNOSIS — R270 Ataxia, unspecified: Secondary | ICD-10-CM | POA: Diagnosis not present

## 2017-06-19 DIAGNOSIS — Z0000 Encounter for general adult medical examination without abnormal findings: Secondary | ICD-10-CM | POA: Diagnosis not present

## 2017-06-19 DIAGNOSIS — I712 Thoracic aortic aneurysm, without rupture: Secondary | ICD-10-CM | POA: Diagnosis not present

## 2017-06-19 DIAGNOSIS — E7800 Pure hypercholesterolemia, unspecified: Secondary | ICD-10-CM | POA: Diagnosis not present

## 2017-06-19 DIAGNOSIS — Z950 Presence of cardiac pacemaker: Secondary | ICD-10-CM | POA: Diagnosis not present

## 2017-06-19 DIAGNOSIS — I495 Sick sinus syndrome: Secondary | ICD-10-CM | POA: Diagnosis not present

## 2017-06-19 DIAGNOSIS — I10 Essential (primary) hypertension: Secondary | ICD-10-CM | POA: Diagnosis not present

## 2017-06-19 DIAGNOSIS — T7840XA Allergy, unspecified, initial encounter: Secondary | ICD-10-CM | POA: Diagnosis not present

## 2017-06-19 DIAGNOSIS — K219 Gastro-esophageal reflux disease without esophagitis: Secondary | ICD-10-CM | POA: Diagnosis not present

## 2017-07-13 ENCOUNTER — Other Ambulatory Visit: Payer: Self-pay | Admitting: Cardiovascular Disease

## 2017-08-20 ENCOUNTER — Encounter: Payer: Self-pay | Admitting: Cardiovascular Disease

## 2017-08-20 ENCOUNTER — Ambulatory Visit (INDEPENDENT_AMBULATORY_CARE_PROVIDER_SITE_OTHER): Payer: Medicare Other | Admitting: Cardiovascular Disease

## 2017-08-20 VITALS — BP 150/80 | HR 82 | Ht 67.0 in | Wt 172.0 lb

## 2017-08-20 DIAGNOSIS — E7800 Pure hypercholesterolemia, unspecified: Secondary | ICD-10-CM | POA: Diagnosis not present

## 2017-08-20 DIAGNOSIS — I10 Essential (primary) hypertension: Secondary | ICD-10-CM | POA: Diagnosis not present

## 2017-08-20 DIAGNOSIS — I712 Thoracic aortic aneurysm, without rupture: Secondary | ICD-10-CM | POA: Diagnosis not present

## 2017-08-20 DIAGNOSIS — I7121 Aneurysm of the ascending aorta, without rupture: Secondary | ICD-10-CM

## 2017-08-20 DIAGNOSIS — I2510 Atherosclerotic heart disease of native coronary artery without angina pectoris: Secondary | ICD-10-CM | POA: Diagnosis not present

## 2017-08-20 DIAGNOSIS — I469 Cardiac arrest, cause unspecified: Secondary | ICD-10-CM | POA: Diagnosis not present

## 2017-08-20 DIAGNOSIS — Z950 Presence of cardiac pacemaker: Secondary | ICD-10-CM | POA: Diagnosis not present

## 2017-08-20 MED ORDER — ENALAPRIL MALEATE 20 MG PO TABS: 20.0000 mg | ORAL_TABLET | Freq: Two times a day (BID) | ORAL | 11 refills | Status: DC

## 2017-08-20 NOTE — Progress Notes (Signed)
Cardiology Office Note    Date:  08/20/2017   ID:  Gary [name], DOB [birth year], MRN [id number]  PCP:  [name], MD  Cardiologist:   Sanda Klein, MD   Chief complaint: Pacemaker follow-up  History of Present Illness:  Gary [name] is a 83 y.o. male  for pacemaker follow-up as well as follow-up on a moderate-sized ascending aortic aneurysm, hypertension and hyperlipidemia.   Unfortunately he had a fall several weeks ago and had a compression vertebral fracture. He continues to walk and exercise. He still works full-time at  [employer]. He has successfully continue to lose more weight. He has noticed that his blood pressures always best after he exercises. Typically 140s/70. He developed ankle edema when he took 5 mg of amlodipine and has cut back to half of that amount. He denies any problems with angina, dyspnea, palpitations, dizziness, leg edema, syncope or neurological issues.  Interrogation of his pacemaker shows normal function of the atrial lead. He almost never requires atrial pacing (less than 1%). His ventricular lead has been inactivated due to over sensing and low impedance suggestive of insulation breach. He has a dual chamber St. Jude Zephyr XL that was implanted in 2009 but the leads in use are still the original unipolar leads implanted in 1987. His pacemaker was implanted for episodes of asystole that occurred while he was in treatment with Thorazine and Propulsid for intractable hiccups.  Past Medical History:  Diagnosis Date  . Ascending aortic aneurysm ([place]) 01/13/2014   stable at 4.7 cm diameter since 2009  . Asystole x2, s/p pacemaker 01/13/2014  . Bradycardia    permanent pacemaker 11/22/08 St.Jude  . HTN (hypertension) 01/13/2014  . Hyperlipidemia   . Hypertension   . Pacemaker - leads 1987, last generator 2009 St. Jude dual-chamber 01/13/2014   Atrial lead model 433-01, ventricular lead model 431-01 implanted November 1987 Right ventricular lead  dysfunction with low impedance and mediocre sensing but satisfactory pacing threshold  . Pacemaker lead malfunction 01/13/2014   Chronic low impedance and poor sensing of the ventricular lead    Past Surgical History:  Procedure Laterality Date  . PERMANENT PACEMAKER GENERATOR CHANGE  11/22/08   ST. Jude  . PERMANENT PACEMAKER INSERTION  03/04/97   ST. Jude  . US ECHOCARDIOGRAPHY  05/16/11   AS mild to mod.,TR mild to mod., EF => 55%.    Current Medications: Outpatient Medications Prior to Visit  Medication Sig Dispense Refill  . aspirin 81 MG tablet Take 81 mg by mouth daily.    . metoprolol succinate (TOPROL-XL) 50 MG 24 hr tablet TAKE 1 TABLET BY MOUTH EVERY DAY WITH A MEAL(NEED TO SCHEDULE AN APPT.) 30 tablet 11  . simvastatin (ZOCOR) 20 MG tablet TAKE 1 TABLET BY MOUTH EVERY NIGHT AT BEDTIME 30 tablet 5  . enalapril (VASOTEC) 20 MG tablet Take 1 tablet (20 mg total) by mouth daily. 30 tablet 11  . amLODipine (NORVASC) 5 MG tablet Take 7.5 mg by mouth daily.    Marland Kitchen amLODipine (NORVASC) 5 MG tablet TAKE 1 TABLET BY MOUTH EVERY DAY 30 tablet 1   No facility-administered medications prior to visit.      Allergies:   Gentamycin [gentamicin sulfate]; Hydrocodone; and Aspirin   Social History   Social History  . Marital status: Married    Spouse name: N/A  . Number of children: N/A  . Years of education: N/A   Social History Main Topics  . Smoking status: Never Smoker  .  Smokeless tobacco: Never Used  . Alcohol use Yes     Comment: occas.  . Drug use: No  . Sexual activity: Not Asked   Other Topics Concern  . None   Social History Narrative  . None     Family History:  The patient's family history includes Cancer in his mother; Heart attack in his father.   ROS:   Please see the history of present illness.    ROS All other systems reviewed and are negative.   PHYSICAL EXAM:   VS:  BP (!) 150/80   Pulse 82   Ht 5\' 7"  (1.702 m)   Wt 172 lb (78 kg)   BMI 26.94  kg/m    GEN: Well nourished, well developed, in no acute distress  HEENT: normal  Neck: no JVD, carotid bruits, or masses Cardiac: RRR; no murmurs, rubs, or gallops,no edema , healthy pacemaker site Respiratory:  clear to auscultation bilaterally, normal work of breathing GI: soft, nontender, nondistended, + BS MS: no deformity or atrophy  Skin: warm and dry, no rash Neuro:  Alert and Oriented x 3, Strength and sensation are intact Psych: euthymic mood, full affect  Wt Readings from Last 3 Encounters:  08/20/17 172 lb (78 kg)  01/21/17 172 lb 12.8 oz (78.4 kg)  07/04/16 176 lb 12.8 oz (80.2 kg)      Studies/Labs Reviewed:   EKG:  EKG is ordered today.  The ekg ordered today demonstrates Normal sinus rhythm,Normal PR interval, normal ECG, QTC 419 ms  Recent Labs:   Lipid Panel    Component Value Date/Time   CHOL 140 02/09/2015 1157   TRIG 83 02/09/2015 1157   HDL 49 02/09/2015 1157   CHOLHDL 2.9 02/09/2015 1157   VLDL 17 02/09/2015 1157   LDLCALC 74 02/09/2015 1157   06/19/2017 labs from Dr. Brigitte Pulse HDL 49, LDL 80, total cholesterol 145, TG 93, glucose 88, creatinine 1.02   ASSESSMENT:    1. Ascending aortic aneurysm ([place])   2. Atherosclerosis of native coronary artery of native heart without angina pectoris   3. Pacemaker - leads 1987, last generator 2009 St. Jude dual-chamber   4. Essential hypertension   5. Pure hypercholesterolemia      PLAN:  In order of problems listed above:  1. Ascending aortic aneurysm: Unchanged in size for years - Most recent study with CT angiogram in July 2017 when it measured 4.84.6 cm essentially unchanged. Recheck CT of the chest in July 2019. 2. Coronary atherosclerosis: Asymptomatic, focus on risk factor modification. He has managed to lose 5 more pounds since last year, continue diet and exercise - try to get waistline down to 34". His recent fall has slowed him down a bit, but he still trying hard to be as healthy as  possible 3. Pacemaker - leads 1987, last generator 2009 St. Jude dual-chamber: with malfunction of the ventricular lead with very low impedance and intermittent poor sensing, suggestive of external insulation breach.The device is programmed AAIR. When the device reaches elective replacement indicator I would give strong consideration to not replacing the generator. It really looks like he doesn't need a pacemaker. 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 Does not appear to have true sinus node dysfunction. His device is not amenable to remote follow-up, recheck in 6 months in office. 4. HTN (hypertension) mediocre control. Target definitely under 130/80 since he has an aortic aneurysm. Did not tolerate higher doses of  amlodipine due to leg edema. Increase the enalapril to 20 mg twice a day. 5. Hyperlipidemia: With all parameters within target range    Medication Adjustments/Labs and Tests Ordered: Current medicines are reviewed at length with the patient today.  Concerns regarding medicines are outlined above.  Medication changes, Labs and Tests ordered today are listed in the Patient Instructions below. Patient Instructions  Dr Sallyanne Kuster has recommended making the following medication changes: 1. INCREASE Enalapril to 20 mg TWICE daily  Your physician has requested that you regularly monitor your blood pressure at home. Please use the same machine to check your blood pressure daily. Keep a record of your blood pressures using the log sheet provided. In 2 weeks, please report your readings back to Dr C. You may use our online patient portal 'MyChart' or you can call the office to speak with a nurse.  Dr Sallyanne Kuster recommends that you schedule a follow-up appointment in 6 months with a pacemaker check. You will receive a reminder letter in the mail two months in advance. If you don't receive a letter, please call our office  to schedule the follow-up appointment.  If you need a refill on your cardiac medications before your next appointment, please call your pharmacy.    Signed, Sanda Klein, MD  08/20/2017 4:40 PM    [facility] [address], [place], [place]  [address] Phone: [phone]; Fax: [phone]

## 2017-08-20 NOTE — Patient Instructions (Signed)
Dr Sallyanne Kuster has recommended making the following medication changes: 1. INCREASE Enalapril to 20 mg TWICE daily  Your physician has requested that you regularly monitor your blood pressure at home. Please use the same machine to check your blood pressure daily. Keep a record of your blood pressures using the log sheet provided. In 2 weeks, please report your readings back to Dr C. You may use our online patient portal 'MyChart' or you can call the office to speak with a nurse.  Dr Sallyanne Kuster recommends that you schedule a follow-up appointment in 6 months with a pacemaker check. You will receive a reminder letter in the mail two months in advance. If you don't receive a letter, please call our office to schedule the follow-up appointment.  If you need a refill on your cardiac medications before your next appointment, please call your pharmacy.

## 2017-08-26 ENCOUNTER — Other Ambulatory Visit: Payer: Self-pay | Admitting: Cardiovascular Disease

## 2017-09-08 LAB — CUP PACEART INCLINIC DEVICE CHECK
Battery Impedance: 1000 Ohm — CL
Battery Voltage: 2.78 V
Brady Statistic RA Percent Paced: 1 % — CL
Date Time Interrogation Session: 20180905150324
Implantable Lead Implant Date: 19871118
Implantable Lead Implant Date: 19871118
Implantable Lead Location: 753859
Implantable Lead Location: 753860
Implantable Lead Serial Number: 652
Implantable Pulse Generator Implant Date: 20091208
Lead Channel Impedance Value: 209 Ohm
Lead Channel Pacing Threshold Amplitude: 0.75 V
Lead Channel Pacing Threshold Pulse Width: 0.5 ms
Lead Channel Sensing Intrinsic Amplitude: 1.9 mV
Lead Channel Setting Pacing Amplitude: 2 V
Pulse Gen Model: 5826
Pulse Gen Serial Number: 2228740

## 2017-09-23 DIAGNOSIS — Z23 Encounter for immunization: Secondary | ICD-10-CM | POA: Diagnosis not present

## 2017-09-30 DIAGNOSIS — R05 Cough: Secondary | ICD-10-CM | POA: Diagnosis not present

## 2017-10-03 ENCOUNTER — Other Ambulatory Visit: Payer: Self-pay | Admitting: Cardiovascular Disease

## 2017-12-03 DIAGNOSIS — H52203 Unspecified astigmatism, bilateral: Secondary | ICD-10-CM | POA: Diagnosis not present

## 2017-12-03 DIAGNOSIS — H26493 Other secondary cataract, bilateral: Secondary | ICD-10-CM | POA: Diagnosis not present

## 2017-12-03 DIAGNOSIS — Z961 Presence of intraocular lens: Secondary | ICD-10-CM | POA: Diagnosis not present

## 2017-12-12 ENCOUNTER — Other Ambulatory Visit: Payer: Self-pay | Admitting: Cardiovascular Disease

## 2018-01-09 ENCOUNTER — Other Ambulatory Visit: Payer: Self-pay | Admitting: Cardiovascular Disease

## 2018-01-09 NOTE — Telephone Encounter (Signed)
Rx has been sent to the pharmacy electronically. ° °

## 2018-02-02 ENCOUNTER — Other Ambulatory Visit: Payer: Self-pay | Admitting: Cardiovascular Disease

## 2018-03-03 ENCOUNTER — Other Ambulatory Visit: Payer: Self-pay | Admitting: Cardiovascular Disease

## 2018-03-30 ENCOUNTER — Ambulatory Visit (INDEPENDENT_AMBULATORY_CARE_PROVIDER_SITE_OTHER): Payer: Medicare Other | Admitting: Cardiovascular Disease

## 2018-03-30 ENCOUNTER — Encounter: Payer: Self-pay | Admitting: Cardiovascular Disease

## 2018-03-30 VITALS — BP 134/78 | HR 63 | Ht 67.0 in | Wt 174.0 lb

## 2018-03-30 DIAGNOSIS — T82110D Breakdown (mechanical) of cardiac electrode, subsequent encounter: Secondary | ICD-10-CM | POA: Diagnosis not present

## 2018-03-30 DIAGNOSIS — I25118 Atherosclerotic heart disease of native coronary artery with other forms of angina pectoris: Secondary | ICD-10-CM | POA: Diagnosis not present

## 2018-03-30 DIAGNOSIS — I10 Essential (primary) hypertension: Secondary | ICD-10-CM

## 2018-03-30 DIAGNOSIS — E7800 Pure hypercholesterolemia, unspecified: Secondary | ICD-10-CM | POA: Diagnosis not present

## 2018-03-30 DIAGNOSIS — I712 Thoracic aortic aneurysm, without rupture: Secondary | ICD-10-CM | POA: Diagnosis not present

## 2018-03-30 DIAGNOSIS — I7121 Aneurysm of the ascending aorta, without rupture: Secondary | ICD-10-CM

## 2018-03-30 NOTE — Patient Instructions (Signed)
Dr Sallyanne Kuster recommends that you continue on your current medications as directed. Please refer to the Current Medication list given to you today.  Your physician recommends that you have a CT Aorta. This will be performed at our [facility] location. [address] [place] [address] [phone]  Your physician recommends that you return for lab work prior to your CT.  Dr Sallyanne Kuster recommends that you schedule a follow-up appointment in 6 months with a pacemaker check. You will receive a reminder letter in the mail two months in advance. If you don't receive a letter, please call our office to schedule the follow-up appointment.  If you need a refill on your cardiac medications before your next appointment, please call your pharmacy.

## 2018-03-30 NOTE — Progress Notes (Signed)
Cardiology Office Note    Date:  04/01/2018   ID:  Gary [name], DOB 06-18-1932, MRN [id number]  PCP:  Gary Neer, MD  Cardiologist:   Gary Klein, MD   Chief complaint: Pacemaker follow-up  History of Present Illness:  Gary [name] is a 84 y.o. male  for pacemaker follow-up as well as follow-up on a moderate-sized ascending aortic aneurysm, hypertension and hyperlipidemia.   The patient specifically denies any chest pain at rest exertion, dyspnea at rest or with exertion, orthopnea, paroxysmal nocturnal dyspnea, syncope, palpitations, focal neurological deficits, intermittent claudication, unexplained weight gain, cough, hemoptysis or wheezing.  He exercises virtually every day.  The day he does get some swelling in his ankles, but it resolves after overnight supine position.  He has a dual chamber St. Jude Zephyr XL that was implanted in 2009 but the leads in use are still the original unipolar leads implanted in 1987. His pacemaker was implanted for episodes of asystole that occurred while he was in treatment with Thorazine and Propulsid for intractable hiccups.  His ventricular lead had sensing problems and very low impedance and we converted his pacemaker to an AAI device a while back.  Today there is some evidence of malfunction of the atrial lead as well.  The impedance is low, but sensing and pacing thresholds remain acceptable.  He never requires atrial pacing.  He has a normal heart rate histogram distribution.  Estimated generator longevity is about 2 years.  There are no recorded episodes of high atrial rates.  Past Medical History:  Diagnosis Date  . Ascending aortic aneurysm ([place]) 01/13/2014   stable at 4.7 cm diameter since 2009  . Asystole x2, s/p pacemaker 01/13/2014  . Bradycardia    permanent pacemaker 11/22/08 St.Jude  . HTN (hypertension) 01/13/2014  . Hyperlipidemia   . Hypertension   . Pacemaker - leads 1987, last generator 2009 St. Jude dual-chamber  01/13/2014   Atrial lead model 433-01, ventricular lead model 431-01 implanted November 1987 Right ventricular lead dysfunction with low impedance and mediocre sensing but satisfactory pacing threshold  . Pacemaker lead malfunction 01/13/2014   Chronic low impedance and poor sensing of the ventricular lead    Past Surgical History:  Procedure Laterality Date  . PERMANENT PACEMAKER GENERATOR CHANGE  11/22/08   ST. Jude  . PERMANENT PACEMAKER INSERTION  03/04/97   ST. Jude  . US ECHOCARDIOGRAPHY  05/16/11   AS mild to mod.,TR mild to mod., EF => 55%.    Current Medications: Outpatient Medications Prior to Visit  Medication Sig Dispense Refill  . amLODipine (NORVASC) 5 MG tablet TAKE 1 TABLET BY MOUTH EVERY DAY (Patient taking differently: TAKE 1/2 TABLET BY MOUTH EVERY DAY) 30 tablet 6  . aspirin 81 MG tablet Take 81 mg by mouth daily.    . enalapril (VASOTEC) 20 MG tablet Take 1 tablet (20 mg total) by mouth 2 (two) times daily. 60 tablet 11  . metoprolol succinate (TOPROL-XL) 50 MG 24 hr tablet Take 1 tablet (50 mg total) by mouth daily. 30 tablet 9  . simvastatin (ZOCOR) 20 MG tablet TAKE 1 TABLET BY MOUTH EVERY NIGHT AT BEDTIME 30 tablet 9  . omeprazole (PRILOSEC) 20 MG capsule TAKE 1 CAPSULE(20 MG) BY MOUTH DAILY 30 capsule 5   No facility-administered medications prior to visit.      Allergies:   Gentamycin [gentamicin sulfate]; Hydrocodone; and Aspirin   Social History   Socioeconomic History  . Marital status: Married  Spouse name: Not on file  . Number of children: Not on file  . Years of education: Not on file  . Highest education level: Not on file  Occupational History  . Not on file  Social Needs  . Financial resource strain: Not on file  . Food insecurity:    Worry: Not on file    Inability: Not on file  . Transportation needs:    Medical: Not on file    Non-medical: Not on file  Tobacco Use  . Smoking status: Never Smoker  . Smokeless tobacco: Never Used    Substance and Sexual Activity  . Alcohol use: Yes    Comment: occas.  . Drug use: No  . Sexual activity: Not on file  Lifestyle  . Physical activity:    Days per week: Not on file    Minutes per session: Not on file  . Stress: Not on file  Relationships  . Social connections:    Talks on phone: Not on file    Gets together: Not on file    Attends religious service: Not on file    Active member of club or organization: Not on file    Attends meetings of clubs or organizations: Not on file    Relationship status: Not on file  Other Topics Concern  . Not on file  Social History Narrative  . Not on file     Family History:  The patient's family history includes Cancer in his mother; Heart attack in his father.   ROS:   Please see the history of present illness.    ROS All other systems reviewed and are negative.   PHYSICAL EXAM:   VS:  BP 134/78 (BP Location: Left Arm, Patient Position: Sitting, Cuff Size: Normal)   Pulse 63   Ht 5\' 7"  (1.702 m)   Wt 174 lb (78.9 kg)   BMI 27.25 kg/m     General: Alert, oriented x3, no distress, looks quite fit for an 84 year old Head: no evidence of trauma, PERRL, EOMI, no exophtalmos or lid lag, no myxedema, no xanthelasma; normal ears, nose and oropharynx Neck: normal jugular venous pulsations and no hepatojugular reflux; brisk carotid pulses without delay and no carotid bruits Chest: clear to auscultation, no signs of consolidation by percussion or palpation, normal fremitus, symmetrical and full respiratory excursions Cardiovascular: normal position and quality of the apical impulse, regular rhythm, normal first and second heart sounds, no murmurs, rubs or gallops.  Healthy pacemaker site Abdomen: no tenderness or distention, no masses by palpation, no abnormal pulsatility or arterial bruits, normal bowel sounds, no hepatosplenomegaly Extremities: no clubbing, cyanosis or edema; 2+ radial, ulnar and brachial pulses bilaterally; 2+  right femoral, posterior tibial and dorsalis pedis pulses; 2+ left femoral, posterior tibial and dorsalis pedis pulses; no subclavian or femoral bruits Neurological: grossly nonfocal Psych: Normal mood and affect   Wt Readings from Last 3 Encounters:  03/30/18 174 lb (78.9 kg)  08/20/17 172 lb (78 kg)  01/21/17 172 lb 12.8 oz (78.4 kg)      Studies/Labs Reviewed:   EKG:  EKG is ordered today.  The ekg ordered today demonstrates normal sinus rhythm, normal PR interval, normal QRS duration, QTC 440 ms Recent Labs:   Lipid Panel    Component Value Date/Time   CHOL 140 02/09/2015 1157   TRIG 83 02/09/2015 1157   HDL 49 02/09/2015 1157   CHOLHDL 2.9 02/09/2015 1157   VLDL 17 02/09/2015 1157   LDLCALC 74 02/09/2015  1157   06/19/2017 labs from Dr. Brigitte Pulse HDL 49, LDL 80, total cholesterol 145, TG 93, glucose 88, creatinine 1.02   ASSESSMENT:    1. Ascending aortic aneurysm ([place])   2. Atherosclerosis of coronary artery of native heart with stable angina pectoris, unspecified vessel or lesion type ([place])   3. Pacemaker lead malfunction, subsequent encounter   4. Essential hypertension   5. Pure hypercholesterolemia      PLAN:  In order of problems listed above:  1. Ascending aortic aneurysm: Has been stable in size for several years.  Most recent study with CT angiogram in July 2017 when it measured 4.84.6 cm essentially unchanged.  It is time to reevaluate the aneurysm size, which we have decided to do on an every other year basis. 2. Coronary atherosclerosis: Symptomatically.  He is doing a good job with lifestyle changes and regular physical exercise, although he has not managed to lose any additional weight. 3. Pacemaker - leads 1987, last generator 2009 St. Jude dual-chamber: with malfunction of the ventricular lead with very low impedance and intermittent poor sensing, suggestive of external insulation breach. The device is programmed AAIR.  There is now evidence of possible  insulation breach of the atrial lead as well, although this has not led to any malfunction.  There is no evidence of oversensing or loss of capture. 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 Does not appear to have true sinus node dysfunction. His device is not amenable to remote follow-up, recheck in 6 months in office.  I suspect he does not really need a pacemaker. 4. HTN (hypertension) fair control, almost at target. (under 130/80 since he has an aortic aneurysm). Did not tolerate higher doses of amlodipine due to leg edema.  He has noticed that his blood pressure is always its best after he exercises, when it is often in the 100/70-110/80 range. 5. Hyperlipidemia: With all parameters within target range.  Will be rechecking with Dr. Brigitte Pulse in a few months    Medication Adjustments/Labs and Tests Ordered: Current medicines are reviewed at length with the patient today.  Concerns regarding medicines are outlined above.  Medication changes, Labs and Tests ordered today are listed in the Patient Instructions below. Patient Instructions  Dr Sallyanne Kuster recommends that you continue on your current medications as directed. Please refer to the Current Medication list given to you today.  Your physician recommends that you have a CT Aorta. This will be performed at our [facility] location. [address], [place] [address] [phone]  Your physician recommends that you return for lab work prior to your CT.  Dr Sallyanne Kuster recommends that you schedule a follow-up appointment in 6 months with a pacemaker check. You will receive a reminder letter in the mail two months in advance. If you don't receive a letter, please call our office to schedule the follow-up appointment.  If you need a refill on your cardiac medications before your next appointment, please call your pharmacy.      Signed, Gary Klein, MD    04/01/2018 2:27 PM    [facility] [address], [place], [place]  [address] Phone: [phone]; Fax: [phone]

## 2018-03-31 LAB — BASIC METABOLIC PANEL
BUN/Creatinine Ratio: 21 (ref 10–24)
BUN: 20 mg/dL (ref 8–27)
CO2: 23 mmol/L (ref 20–29)
Calcium: 9.1 mg/dL (ref 8.6–10.2)
Chloride: 104 mmol/L (ref 96–106)
Creatinine, Ser: 0.97 mg/dL (ref 0.76–1.27)
GFR calc Af Amer: 83 mL/min/{1.73_m2} (ref >59)
GFR calc non Af Amer: 72 mL/min/{1.73_m2} (ref >59)
Glucose: 89 mg/dL (ref 65–99)
Potassium: 4.6 mmol/L (ref 3.5–5.2)
Sodium: 140 mmol/L (ref 134–144)

## 2018-04-01 ENCOUNTER — Encounter: Payer: Self-pay | Admitting: Cardiovascular Disease

## 2018-04-01 ENCOUNTER — Telehealth: Payer: Self-pay | Admitting: Cardiovascular Disease

## 2018-04-01 NOTE — Telephone Encounter (Signed)
llow Up:  Pt would like his lab results from Monday please.

## 2018-04-01 NOTE — Telephone Encounter (Signed)
Returned the call to the patient's wife, per the dpr. She has been made aware that the lab results have not been read yet but will receive a call when they have. She verbalized her understanding.

## 2018-04-01 NOTE — Telephone Encounter (Signed)
Follow up ° °Pt returning call for nurse °

## 2018-04-01 NOTE — Telephone Encounter (Signed)
Left message to call back  

## 2018-04-06 ENCOUNTER — Telehealth: Payer: Self-pay | Admitting: Cardiovascular Disease

## 2018-04-06 NOTE — Telephone Encounter (Signed)
New message  Pt wife verbalzied that she is calling for RN  Wife needs results of a lab   She is wanting a call back today

## 2018-04-06 NOTE — Telephone Encounter (Signed)
Left message to call back  

## 2018-04-06 NOTE — Telephone Encounter (Signed)
Patient's wife has been made aware that the lab results have not been read yet but she has been made aware of preliminary results.

## 2018-04-07 ENCOUNTER — Telehealth: Payer: Self-pay | Admitting: Cardiovascular Disease

## 2018-04-07 NOTE — Telephone Encounter (Signed)
Returned call to patient's wife.She stated son will be in town next week.She wants to schedule a meeting with Dr.Croitoru and bring son to talk about husband's condition.Advised I will send message to Dr.Croitoru's CMA to schedule appointment.

## 2018-04-07 NOTE — Telephone Encounter (Signed)
New message  Pt wife verbalzied that she is calling for rn  She would like to schedule a meeting next week with Dr.Croitoru and pt son   Doren Kaspar to go over fathers records

## 2018-04-09 ENCOUNTER — Ambulatory Visit (INDEPENDENT_AMBULATORY_CARE_PROVIDER_SITE_OTHER)
Admission: RE | Admit: 2018-04-09 | Discharge: 2018-04-09 | Disposition: A | Discharge status: Home / Self Care | Payer: Medicare Other | Source: Ambulatory Visit | Attending: Cardiovascular Disease | Admitting: Cardiovascular Disease

## 2018-04-09 DIAGNOSIS — I7121 Aneurysm of the ascending aorta, without rupture: Secondary | ICD-10-CM

## 2018-04-09 DIAGNOSIS — I712 Thoracic aortic aneurysm, without rupture: Secondary | ICD-10-CM | POA: Diagnosis not present

## 2018-04-09 MED ORDER — IOPAMIDOL (ISOVUE-370) INJECTION 76%
100.0000 mL | Freq: Once | INTRAVENOUS | Status: AC | PRN
  Administered 2018-04-09: 100 mL via INTRAVENOUS

## 2018-04-17 ENCOUNTER — Encounter: Payer: Self-pay | Admitting: Cardiovascular Disease

## 2018-04-17 ENCOUNTER — Ambulatory Visit (INDEPENDENT_AMBULATORY_CARE_PROVIDER_SITE_OTHER): Payer: Medicare Other | Admitting: Cardiovascular Disease

## 2018-04-17 VITALS — BP 150/80 | HR 64 | Ht 67.0 in | Wt 171.4 lb

## 2018-04-17 DIAGNOSIS — I712 Thoracic aortic aneurysm, without rupture: Secondary | ICD-10-CM

## 2018-04-17 DIAGNOSIS — I10 Essential (primary) hypertension: Secondary | ICD-10-CM

## 2018-04-17 DIAGNOSIS — I2510 Atherosclerotic heart disease of native coronary artery without angina pectoris: Secondary | ICD-10-CM | POA: Diagnosis not present

## 2018-04-17 DIAGNOSIS — T82110D Breakdown (mechanical) of cardiac electrode, subsequent encounter: Secondary | ICD-10-CM | POA: Diagnosis not present

## 2018-04-17 DIAGNOSIS — I7121 Aneurysm of the ascending aorta, without rupture: Secondary | ICD-10-CM

## 2018-04-17 DIAGNOSIS — E7800 Pure hypercholesterolemia, unspecified: Secondary | ICD-10-CM

## 2018-04-17 NOTE — Progress Notes (Signed)
Cardiology Office Note    Date:  04/17/2018   ID:  JA OHMAN, DOB November 22, 1932, MRN [id number]  PCP:  [name], MD  Cardiologist:   Sanda Klein, MD   Chief complaint: follow-up on CT angiogram of the aorta, discuss long-term plans for his pacemaker  History of Present Illness:  Gary [name] is a 84 y.o. male  for pacemaker follow-up as well as follow-up on a moderate-sized ascending aortic aneurysm, hypertension and hyperlipidemia.   The patient specifically denies any chest pain at rest exertion, dyspnea at rest or with exertion, orthopnea, paroxysmal nocturnal dyspnea, syncope, palpitations, focal neurological deficits, intermittent claudication, lower extremity edema, unexplained weight gain, cough, hemoptysis or wheezing.  He brought his son Gary [name], who is visiting from [place], the situation surrounding his pacemaker.  Also wanted to discuss the most recent CT angiogram of his ascending aortic aneurysm.  He has a dual chamber St. Jude Zephyr XL that was implanted in 2009 but the leads in use are still the original unipolar leads implanted in 1987. His pacemaker was implanted for episodes of asystole that occurred while he was in treatment with Thorazine and Propulsid for intractable hiccups.  His ventricular lead had sensing problems and very low impedance and we converted his pacemaker to an AAI device a while back.  Today there is some evidence of malfunction of the atrial lead as well.  The impedance is low, but sensing and pacing thresholds remain acceptable.  He never requires atrial pacing.  He has a normal heart rate histogram distribution.  Estimated generator longevity is about 2 years.  There are no recorded episodes of high atrial rates.  Past Medical History:  Diagnosis Date  . Ascending aortic aneurysm ([place]) 01/13/2014   stable at 4.7 cm diameter since 2009  . Asystole x2, s/p pacemaker 01/13/2014  . Bradycardia    permanent pacemaker 11/22/08 St.Jude    . HTN (hypertension) 01/13/2014  . Hyperlipidemia   . Hypertension   . Pacemaker - leads 1987, last generator 2009 St. Jude dual-chamber 01/13/2014   Atrial lead model 433-01, ventricular lead model 431-01 implanted November 1987 Right ventricular lead dysfunction with low impedance and mediocre sensing but satisfactory pacing threshold  . Pacemaker lead malfunction 01/13/2014   Chronic low impedance and poor sensing of the ventricular lead    Past Surgical History:  Procedure Laterality Date  . PERMANENT PACEMAKER GENERATOR CHANGE  11/22/08   ST. Jude  . PERMANENT PACEMAKER INSERTION  03/04/97   ST. Jude  . US ECHOCARDIOGRAPHY  05/16/11   AS mild to mod.,TR mild to mod., EF => 55%.    Current Medications: Outpatient Medications Prior to Visit  Medication Sig Dispense Refill  . amLODipine (NORVASC) 5 MG tablet TAKE 1 TABLET BY MOUTH EVERY DAY (Patient taking differently: TAKE 1/2 TABLET BY MOUTH EVERY DAY) 30 tablet 6  . aspirin 81 MG tablet Take 81 mg by mouth daily.    . enalapril (VASOTEC) 20 MG tablet Take 1 tablet (20 mg total) by mouth 2 (two) times daily. 60 tablet 11  . metoprolol succinate (TOPROL-XL) 50 MG 24 hr tablet Take 1 tablet (50 mg total) by mouth daily. 30 tablet 9  . simvastatin (ZOCOR) 20 MG tablet TAKE 1 TABLET BY MOUTH EVERY NIGHT AT BEDTIME 30 tablet 9   No facility-administered medications prior to visit.      Allergies:   Gentamycin [gentamicin sulfate]; Hydrocodone; and Aspirin   Social History   Socioeconomic History  .  Marital status: Married    Spouse name: Not on file  . Number of children: Not on file  . Years of education: Not on file  . Highest education level: Not on file  Occupational History  . Not on file  Social Needs  . Financial resource strain: Not on file  . Food insecurity:    Worry: Not on file    Inability: Not on file  . Transportation needs:    Medical: Not on file    Non-medical: Not on file  Tobacco Use  . Smoking  status: Never Smoker  . Smokeless tobacco: Never Used  Substance and Sexual Activity  . Alcohol use: Yes    Comment: occas.  . Drug use: No  . Sexual activity: Not on file  Lifestyle  . Physical activity:    Days per week: Not on file    Minutes per session: Not on file  . Stress: Not on file  Relationships  . Social connections:    Talks on phone: Not on file    Gets together: Not on file    Attends religious service: Not on file    Active member of club or organization: Not on file    Attends meetings of clubs or organizations: Not on file    Relationship status: Not on file  Other Topics Concern  . Not on file  Social History Narrative  . Not on file     Family History:  The patient's family history includes Cancer in his mother; Heart attack in his father.   ROS:   Please see the history of present illness.    ROS All other systems reviewed and are negative.   PHYSICAL EXAM:   VS:  BP (!) 150/80   Pulse 64   Ht 5\' 7"  (1.702 m)   Wt 171 lb 6.4 oz (77.7 kg)   SpO2 99%   BMI 26.85 kg/m     General: Alert, oriented x3, no distress, looks quite fit for an 84 year old Head: no evidence of trauma, PERRL, EOMI, no exophtalmos or lid lag, no myxedema, no xanthelasma; normal ears, nose and oropharynx Neck: normal jugular venous pulsations and no hepatojugular reflux; brisk carotid pulses without delay and no carotid bruits Chest: clear to auscultation, no signs of consolidation by percussion or palpation, normal fremitus, symmetrical and full respiratory excursions Cardiovascular: normal position and quality of the apical impulse, regular rhythm, normal first and second heart sounds, no murmurs, rubs or gallops.  Healthy pacemaker site Abdomen: no tenderness or distention, no masses by palpation, no abnormal pulsatility or arterial bruits, normal bowel sounds, no hepatosplenomegaly Extremities: no clubbing, cyanosis or edema; 2+ radial, ulnar and brachial pulses bilaterally;  2+ right femoral, posterior tibial and dorsalis pedis pulses; 2+ left femoral, posterior tibial and dorsalis pedis pulses; no subclavian or femoral bruits Neurological: grossly nonfocal Psych: Normal mood and affect   Wt Readings from Last 3 Encounters:  04/17/18 171 lb 6.4 oz (77.7 kg)  03/30/18 174 lb (78.9 kg)  08/20/17 172 lb (78 kg)      Studies/Labs Reviewed:   EKG:  EKG is ordered today.  The ekg ordered today demonstrates normal sinus rhythm, normal PR interval, normal QRS duration, QTC 440 ms Recent Labs:   Lipid Panel    Component Value Date/Time   CHOL 140 02/09/2015 1157   TRIG 83 02/09/2015 1157   HDL 49 02/09/2015 1157   CHOLHDL 2.9 02/09/2015 1157   VLDL 17 02/09/2015 1157   LDLCALC  74 02/09/2015 1157   06/19/2017 labs from Dr. Brigitte Pulse HDL 49, LDL 80, total cholesterol 145, TG 93, glucose 88, creatinine 1.02   ASSESSMENT:    1. Ascending aortic aneurysm ([place])   2. Atherosclerosis of native coronary artery of native heart without angina pectoris   3. Pacemaker lead malfunction, subsequent encounter   4. Essential hypertension   5. Pure hypercholesterolemia      PLAN:  In order of problems listed above:  1. Ascending aortic aneurysm: Remains stable in size at about 4.7 cm and we will continue to monitor it on an other year basis 2. Coronary atherosclerosis: Asymptomatic.  He is very active. 3. Pacemaker - leads 1987, last generator 2009 St. Jude dual-chamber: with malfunction of the ventricular lead with very low impedance and intermittent poor sensing, suggestive of external insulation breach. The device is programmed AAIR.  There is now evidence of possible insulation breach of the atrial lead as well, although this has not led to any malfunction.  There is no evidence of oversensing or loss of capture. 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 Does not appear to have true sinus  node dysfunction. His device is not amenable to remote follow-up, recheck in 6 months in office.  I suspect he does not really need a pacemaker. Discussed all these considerations with Mr. and Mrs. Bringle as well as with Jimmy.  While I cannot be absolutely sure that he will not have episodes of sinus node arrest (for example from vasovagal response), I believe it is likely that he does not have true sick sinus syndrome.  When the atrial lead parameters deteriorate to the point that the device is not reliable, have to make a decision whether or not he should have an entirely new pacemaker implanted from the left subclavian approach. 4. HTN (hypertension) fair control, almost at target. (under 130/80 since he has an aortic aneurysm). Did not tolerate higher doses of amlodipine due to leg edema.  He has noticed that his blood pressure is always its best after he exercises, when it is often in the 100/70-110/80 range.  Have decided not to change his blood pressure medicines at this point, to see what happens as he loses some additional weight.  If his blood pressure does not improve, would increase the dose of enalapril. 5. Hyperlipidemia: With all parameters within target range.  Will be rechecking with Dr. Brigitte Pulse in a few months    Medication Adjustments/Labs and Tests Ordered: Current medicines are reviewed at length with the patient today.  Concerns regarding medicines are outlined above.  Medication changes, Labs and Tests ordered today are listed in the Patient Instructions below. Patient Instructions  Follow-up in October with Dr Sallyanne Kuster.    Signed, Sanda Klein, MD  04/17/2018 12:48 PM    [facility] [address], [place], [place]  [address] Phone: [phone]; Fax: [phone]

## 2018-04-17 NOTE — Patient Instructions (Signed)
Follow-up in October with Dr Sallyanne Kuster.

## 2018-05-05 DIAGNOSIS — R3915 Urgency of urination: Secondary | ICD-10-CM | POA: Diagnosis not present

## 2018-05-05 DIAGNOSIS — N401 Enlarged prostate with lower urinary tract symptoms: Secondary | ICD-10-CM | POA: Diagnosis not present

## 2018-05-13 DIAGNOSIS — M6281 Muscle weakness (generalized): Secondary | ICD-10-CM | POA: Diagnosis not present

## 2018-05-13 DIAGNOSIS — M62838 Other muscle spasm: Secondary | ICD-10-CM | POA: Diagnosis not present

## 2018-05-13 DIAGNOSIS — R3915 Urgency of urination: Secondary | ICD-10-CM | POA: Diagnosis not present

## 2018-05-18 DIAGNOSIS — M62838 Other muscle spasm: Secondary | ICD-10-CM | POA: Diagnosis not present

## 2018-05-18 DIAGNOSIS — R3915 Urgency of urination: Secondary | ICD-10-CM | POA: Diagnosis not present

## 2018-05-18 DIAGNOSIS — M6281 Muscle weakness (generalized): Secondary | ICD-10-CM | POA: Diagnosis not present

## 2018-05-21 LAB — CUP PACEART INCLINIC DEVICE CHECK
Date Time Interrogation Session: 20190606112656
Implantable Lead Implant Date: 19871118
Implantable Lead Implant Date: 19871118
Implantable Lead Location: 753859
Implantable Lead Location: 753860
Implantable Lead Serial Number: 652
Implantable Pulse Generator Implant Date: 20091208
Lead Channel Setting Pacing Amplitude: 2 V
Pulse Gen Model: 5826
Pulse Gen Serial Number: 2228740

## 2018-06-04 DIAGNOSIS — M6281 Muscle weakness (generalized): Secondary | ICD-10-CM | POA: Diagnosis not present

## 2018-06-04 DIAGNOSIS — M62838 Other muscle spasm: Secondary | ICD-10-CM | POA: Diagnosis not present

## 2018-06-04 DIAGNOSIS — R3915 Urgency of urination: Secondary | ICD-10-CM | POA: Diagnosis not present

## 2018-07-02 DIAGNOSIS — R3915 Urgency of urination: Secondary | ICD-10-CM | POA: Diagnosis not present

## 2018-07-02 DIAGNOSIS — M62838 Other muscle spasm: Secondary | ICD-10-CM | POA: Diagnosis not present

## 2018-07-02 DIAGNOSIS — M6281 Muscle weakness (generalized): Secondary | ICD-10-CM | POA: Diagnosis not present

## 2018-07-03 ENCOUNTER — Other Ambulatory Visit: Payer: Self-pay | Admitting: Cardiovascular Disease

## 2018-07-03 NOTE — Telephone Encounter (Signed)
Rx request sent to pharmacy.  

## 2018-07-20 DIAGNOSIS — M25511 Pain in right shoulder: Secondary | ICD-10-CM | POA: Diagnosis not present

## 2018-07-27 DIAGNOSIS — M25511 Pain in right shoulder: Secondary | ICD-10-CM | POA: Diagnosis not present

## 2018-08-24 DIAGNOSIS — I495 Sick sinus syndrome: Secondary | ICD-10-CM | POA: Diagnosis not present

## 2018-08-24 DIAGNOSIS — I712 Thoracic aortic aneurysm, without rupture: Secondary | ICD-10-CM | POA: Diagnosis not present

## 2018-08-24 DIAGNOSIS — Z0000 Encounter for general adult medical examination without abnormal findings: Secondary | ICD-10-CM | POA: Diagnosis not present

## 2018-08-24 DIAGNOSIS — I10 Essential (primary) hypertension: Secondary | ICD-10-CM | POA: Diagnosis not present

## 2018-08-24 DIAGNOSIS — K219 Gastro-esophageal reflux disease without esophagitis: Secondary | ICD-10-CM | POA: Diagnosis not present

## 2018-08-24 DIAGNOSIS — R270 Ataxia, unspecified: Secondary | ICD-10-CM | POA: Diagnosis not present

## 2018-08-24 DIAGNOSIS — E7800 Pure hypercholesterolemia, unspecified: Secondary | ICD-10-CM | POA: Diagnosis not present

## 2018-08-24 DIAGNOSIS — Z950 Presence of cardiac pacemaker: Secondary | ICD-10-CM | POA: Diagnosis not present

## 2018-08-24 DIAGNOSIS — M79645 Pain in left finger(s): Secondary | ICD-10-CM | POA: Diagnosis not present

## 2018-08-28 ENCOUNTER — Other Ambulatory Visit: Payer: Self-pay | Admitting: Cardiovascular Disease

## 2018-10-02 DIAGNOSIS — Z23 Encounter for immunization: Secondary | ICD-10-CM | POA: Diagnosis not present

## 2018-10-20 ENCOUNTER — Encounter: Payer: Self-pay | Admitting: Cardiovascular Disease

## 2018-10-20 ENCOUNTER — Ambulatory Visit (INDEPENDENT_AMBULATORY_CARE_PROVIDER_SITE_OTHER): Payer: Medicare Other | Admitting: Cardiovascular Disease

## 2018-10-20 VITALS — BP 156/84 | HR 63 | Ht 65.0 in | Wt 175.8 lb

## 2018-10-20 DIAGNOSIS — I10 Essential (primary) hypertension: Secondary | ICD-10-CM

## 2018-10-20 DIAGNOSIS — I712 Thoracic aortic aneurysm, without rupture: Secondary | ICD-10-CM | POA: Diagnosis not present

## 2018-10-20 DIAGNOSIS — I2510 Atherosclerotic heart disease of native coronary artery without angina pectoris: Secondary | ICD-10-CM | POA: Diagnosis not present

## 2018-10-20 DIAGNOSIS — E7800 Pure hypercholesterolemia, unspecified: Secondary | ICD-10-CM

## 2018-10-20 DIAGNOSIS — I7121 Aneurysm of the ascending aorta, without rupture: Secondary | ICD-10-CM

## 2018-10-20 DIAGNOSIS — Z950 Presence of cardiac pacemaker: Secondary | ICD-10-CM | POA: Diagnosis not present

## 2018-10-20 NOTE — Progress Notes (Signed)
Cardiology Office Note    Date:  10/21/2018   ID:  Gary [name], DOB 1932/04/20, MRN [id number]  PCP:  [name], MD  Cardiologist:   Sanda Klein, MD   Chief complaint: Pacemaker malfunction, aortic aneurysm  History of Present Illness:  Gary [name] is a 84 y.o. male  for pacemaker follow-up as well as follow-up on a moderate-sized ascending aortic aneurysm, hypertension and hyperlipidemia.   The patient specifically denies any chest pain at rest exertion, dyspnea at rest or with exertion, orthopnea, paroxysmal nocturnal dyspnea, syncope, palpitations, focal neurological deficits, intermittent claudication, lower extremity edema, unexplained weight gain, cough, hemoptysis or wheezing.  Interrogation of the pacemaker today shows further deterioration in the atrial lead function.  Atrial impedance is no less than 200 ohm.  The sensed P waves are 1.7-1.8 mV, but noise is easily reproduced with isometric exercises that exceeds 0.75 V.  The noise can be effectively screened out with a sensitivity threshold of 1.0 V, which allows for less than a 2 times margin.  When atrial capture is tested there is a lot of artifact that prevents accurate assessment.  It seems like the atrial lead insulation is completely compromised at this point.  Despite all this, atrial pacing occurs less than 1% of the time, which has been the case ever since device implantation.  Similar problems had occurred with his ventricular lead starting a couple of years ago so that his device was programmed AAI.  The device was reprogrammed ODO to avoid what will become inevitable unnecessary pacing.  He has a dual chamber St. Jude Zephyr XL that was implanted in 2009 but the leads in use are still the original unipolar leads implanted in 1987. His pacemaker was implanted for episodes of asystole that occurred while he was in treatment with Thorazine and Propulsid for intractable hiccups.   Past Medical History:    Diagnosis Date  . Ascending aortic aneurysm ([place]) 01/13/2014   stable at 4.7 cm diameter since 2009  . Asystole x2, s/p pacemaker 01/13/2014  . Bradycardia    permanent pacemaker 11/22/08 St.Jude  . HTN (hypertension) 01/13/2014  . Hyperlipidemia   . Hypertension   . Pacemaker - leads 1987, last generator 2009 St. Jude dual-chamber 01/13/2014   Atrial lead model 433-01, ventricular lead model 431-01 implanted November 1987 Right ventricular lead dysfunction with low impedance and mediocre sensing but satisfactory pacing threshold  . Pacemaker lead malfunction 01/13/2014   Chronic low impedance and poor sensing of the ventricular lead    Past Surgical History:  Procedure Laterality Date  . PERMANENT PACEMAKER GENERATOR CHANGE  11/22/08   ST. Jude  . PERMANENT PACEMAKER INSERTION  03/04/97   ST. Jude  . US ECHOCARDIOGRAPHY  05/16/11   AS mild to mod.,TR mild to mod., EF => 55%.    Current Medications: Outpatient Medications Prior to Visit  Medication Sig Dispense Refill  . amLODipine (NORVASC) 5 MG tablet Take by mouth daily. Pt takes 2.5mg  in the morning and 2.5mg  in the evening    . aspirin 81 MG tablet Take 81 mg by mouth daily.    . enalapril (VASOTEC) 20 MG tablet TAKE 1 TABLET(20 MG) BY MOUTH TWICE DAILY 180 tablet 1  . metoprolol succinate (TOPROL-XL) 50 MG 24 hr tablet Take 1 tablet (50 mg total) by mouth daily. 30 tablet 9  . simvastatin (ZOCOR) 20 MG tablet TAKE 1 TABLET BY MOUTH EVERY NIGHT AT BEDTIME 30 tablet 9  . amLODipine (NORVASC) 5  MG tablet TAKE 1 TABLET BY MOUTH EVERY DAY (Patient taking differently: TAKE 1/2 TABLET BY MOUTH EVERY DAY) 30 tablet 6  . enalapril (VASOTEC) 20 MG tablet TAKE 1 TABLET(20 MG) BY MOUTH TWICE DAILY (Patient not taking: Reported on 10/20/2018) 180 tablet 1   No facility-administered medications prior to visit.      Allergies:   Gentamycin [gentamicin sulfate]; Hydrocodone; and Aspirin   Social History   Socioeconomic History  . Marital  status: Married    Spouse name: Not on file  . Number of children: Not on file  . Years of education: Not on file  . Highest education level: Not on file  Occupational History  . Not on file  Social Needs  . Financial resource strain: Not on file  . Food insecurity:    Worry: Not on file    Inability: Not on file  . Transportation needs:    Medical: Not on file    Non-medical: Not on file  Tobacco Use  . Smoking status: Never Smoker  . Smokeless tobacco: Never Used  Substance and Sexual Activity  . Alcohol use: Yes    Comment: occas.  . Drug use: No  . Sexual activity: Not on file  Lifestyle  . Physical activity:    Days per week: Not on file    Minutes per session: Not on file  . Stress: Not on file  Relationships  . Social connections:    Talks on phone: Not on file    Gets together: Not on file    Attends religious service: Not on file    Active member of club or organization: Not on file    Attends meetings of clubs or organizations: Not on file    Relationship status: Not on file  Other Topics Concern  . Not on file  Social History Narrative  . Not on file     Family History:  The patient's family history includes Cancer in his mother; Heart attack in his father.   ROS:   Please see the history of present illness.    ROS all other systems were reviewed and are negative  PHYSICAL EXAM:   VS:  BP (!) 156/84   Pulse 63   Ht 5\' 5"  (1.651 m)   Wt 175 lb 12.8 oz (79.7 kg)   BMI 29.25 kg/m     General: Alert, oriented x3, no distress, overweight, but looks younger than his stated age Head: no evidence of trauma, PERRL, EOMI, no exophtalmos or lid lag, no myxedema, no xanthelasma; normal ears, nose and oropharynx Neck: normal jugular venous pulsations and no hepatojugular reflux; brisk carotid pulses without delay and no carotid bruits Chest: clear to auscultation, no signs of consolidation by percussion or palpation, normal fremitus, symmetrical and full  respiratory excursions Cardiovascular: normal position and quality of the apical impulse, regular rhythm, normal first and second heart sounds, no murmurs, rubs or gallops Abdomen: no tenderness or distention, no masses by palpation, no abnormal pulsatility or arterial bruits, normal bowel sounds, no hepatosplenomegaly Extremities: no clubbing, cyanosis or edema; 2+ radial, ulnar and brachial pulses bilaterally; 2+ right femoral, posterior tibial and dorsalis pedis pulses; 2+ left femoral, posterior tibial and dorsalis pedis pulses; no subclavian or femoral bruits Neurological: grossly nonfocal Psych: Normal mood and affect    Wt Readings from Last 3 Encounters:  10/20/18 175 lb 12.8 oz (79.7 kg)  04/17/18 171 lb 6.4 oz (77.7 kg)  03/30/18 174 lb (78.9 kg)  Studies/Labs Reviewed:   EKG:  EKG is ordered today.  The ekg ordered today shows normal sinus rhythm with mild first-degree AV block (PR 214 ms), otherwise normal tracing, QTC 437 ms Recent Labs:   Lipid Panel    Component Value Date/Time   CHOL 140 02/09/2015 1157   TRIG 83 02/09/2015 1157   HDL 49 02/09/2015 1157   CHOLHDL 2.9 02/09/2015 1157   VLDL 17 02/09/2015 1157   LDLCALC 74 02/09/2015 1157   06/19/2017 labs from Dr. Brigitte Pulse HDL 49, LDL 80, total cholesterol 145, TG 93, glucose 88, creatinine 1.02  ASSESSMENT:    1. Atherosclerosis of native coronary artery of native heart without angina pectoris   2. Pacemaker   3. Ascending aortic aneurysm ([facility])      PLAN:  In order of problems listed above:  1. Ascending aortic aneurysm: Most recent CT of the chest showed a stable sized ascending aortic aneurysm at 4.8 cm in diameter, will monitor on an every other year basis. 2. Coronary atherosclerosis: He is very active and does not have angina pectoris. 3. Pacemaker - leads 1987, last generator 2009 St. Jude dual-chamber: Both his atrial and ventricular leads are essentially useless.  Fortunately he does not really  require pacing.  It is very likely that his pacemaker was inserted following an episode of atrial pause that was actually in the setting of neurally mediated syncope.  He does not have a long-standing history of syncope and has not experienced any syncope since pacemaker implantation in 1987.  The device was reprogrammed 0D0 to avoid potential problems with inappropriate pacing.  We discussed device explantation and its association with high risk of complications.  At this point I think the best approach is to simply leave everything in place and observe the patient for recurrent syncope.  If necessary, we can consider reimplantation of a pacemaker from a left subclavian area or removal of the old leads to avoid having too many redundant leads in the superior vena cava.  While I cannot be absolutely sure that he will not have episodes of sinus node arrest (for example from vasovagal response), I believe it is clear that he does not have true sick sinus syndrome.   4. HTN (hypertension) when rechecked his blood pressure was 128/77 mmHg.  Continue current antihypertensive regimen. 5. Hyperlipidemia: With all parameters within target range.  Followed by Dr. Brigitte Pulse.    Medication Adjustments/Labs and Tests Ordered: Current medicines are reviewed at length with the patient today.  Concerns regarding medicines are outlined above.  Medication changes, Labs and Tests ordered today are listed in the Patient Instructions below. Patient Instructions  Medication Instructions:  Dr Sallyanne Kuster recommends that you continue on your current medications as directed. Please refer to the Current Medication list given to you today.  If you need a refill on your cardiac medications before your next appointment, please call your pharmacy.   Follow-Up: At [facility], you and your health needs are our priority.  As part of our continuing mission to provide you with exceptional heart care, we have created designated Provider  Care Teams.  These Care Teams include your primary Cardiologist (physician) and Advanced Practice Providers (APPs -  Physician Assistants and Nurse Practitioners) who all work together to provide you with the care you need, when you need it. You will need a follow up appointment in 6 months.  Please call our office 2 months in advance to schedule this appointment.  You may see Nevayah Faust,  MD or one of the following Advanced Practice Providers on your designated Care Team: [place], [place] . Fabian Sharp, PA-C    Signed, Sanda Klein, MD  10/21/2018 1:18 PM    [facility] [address], [place], [place]  [address] Phone: [phone]; Fax: [phone]

## 2018-10-20 NOTE — Patient Instructions (Signed)
Medication Instructions:  Dr Croitoru recommends that you continue on your current medications as directed. Please refer to the Current Medication list given to you today.  If you need a refill on your cardiac medications before your next appointment, please call your pharmacy.   Follow-Up: At [facility], you and your health needs are our priority.  As part of our continuing mission to provide you with exceptional heart care, we have created designated Provider Care Teams.  These Care Teams include your primary Cardiologist (physician) and Advanced Practice Providers (APPs -  Physician Assistants and Nurse Practitioners) who all work together to provide you with the care you need, when you need it. You will need a follow up appointment in 6 months.  Please call our office 2 months in advance to schedule this appointment.  You may see Mihai Croitoru, MD or one of the following Advanced Practice Providers on your designated Care Team: Hao Meng, PA-C . Toriano Aikey Duke, PA-C 

## 2018-11-06 ENCOUNTER — Other Ambulatory Visit: Payer: Self-pay | Admitting: Cardiovascular Disease

## 2018-11-09 ENCOUNTER — Other Ambulatory Visit: Payer: Self-pay

## 2018-11-09 MED ORDER — SIMVASTATIN 20 MG PO TABS: 20.0000 mg | ORAL_TABLET | Freq: Every day | ORAL | 6 refills | Status: DC

## 2018-11-11 ENCOUNTER — Other Ambulatory Visit: Payer: Self-pay | Admitting: Cardiovascular Disease

## 2018-11-11 NOTE — Telephone Encounter (Signed)
New message    Dan from [facility] is calling for refill.   *STAT* If patient is at the pharmacy, call can be transferred to refill team.   1. Which medications need to be refilled? (please list name of each medication and dose if known) Simvastatin 20 mg  2. Which pharmacy/location (including street and city if local pharmacy) is medication to be sent to? [facility] at [address]  3. Do they need a 30 day or 90 day supply? 30 day

## 2018-12-10 ENCOUNTER — Other Ambulatory Visit: Payer: Self-pay | Admitting: Cardiovascular Disease

## 2019-01-13 DIAGNOSIS — H524 Presbyopia: Secondary | ICD-10-CM | POA: Diagnosis not present

## 2019-01-13 DIAGNOSIS — H26491 Other secondary cataract, right eye: Secondary | ICD-10-CM | POA: Diagnosis not present

## 2019-01-13 DIAGNOSIS — Z961 Presence of intraocular lens: Secondary | ICD-10-CM | POA: Diagnosis not present

## 2019-01-15 LAB — CUP PACEART INCLINIC DEVICE CHECK
Date Time Interrogation Session: 20200131150616
Implantable Lead Implant Date: 19871118
Implantable Lead Implant Date: 19871118
Implantable Lead Location: 753859
Implantable Lead Location: 753860
Implantable Lead Serial Number: 652
Implantable Pulse Generator Implant Date: 20091208
Pulse Gen Model: 5826
Pulse Gen Serial Number: 2228740

## 2019-01-28 ENCOUNTER — Telehealth: Payer: Self-pay | Admitting: Cardiovascular Disease

## 2019-01-28 MED ORDER — AMLODIPINE BESYLATE 5 MG PO TABS: 5.0000 mg | ORAL_TABLET | Freq: Every day | ORAL | 0 refills | Status: DC

## 2019-01-28 NOTE — Telephone Encounter (Signed)
Please increase amlodipine dose to 5mg  daily, limit sodium intake and stay hydrated.  Call back in 1 week if no improvement noted.

## 2019-01-28 NOTE — Telephone Encounter (Signed)
Pt c/o BP issue: STAT if pt c/o blurred vision, one-sided weakness or slurred speech  1. What are your last 5 BP readings?  162/101 159/98 153/96 145/91  2. Are you having any other symptoms (ex. Dizziness, headache, blurred vision, passed out)? no  3. What is your BP issue? Pt wife called and said her husband's BP is  Higher than normal for him over the past two weeks. Wife would like to know what to do to bring it down to a normal reading  Wife says normal is 139/88

## 2019-01-28 NOTE — Telephone Encounter (Signed)
I agree [employer]

## 2019-01-28 NOTE — Telephone Encounter (Signed)
Returned call to pt's wife. She report pt's BP has been running higher than normal for the last 2 weeks. Last night she report BP was 162/101. He checked it 2 more times and it come down to 143/94 and 148/96. She states it has been running around those readings. Pt denies any symptoms but states pt is only taking 1/2 tablet of amlodipine( 2.5mg ) in the morning. Will route to Pharm D and Dr. Loletha Grayer for recommendations.

## 2019-01-28 NOTE — Telephone Encounter (Signed)
Wife updated with Pharm D's recommendation and verbalized understanding. Orders updated.

## 2019-02-19 ENCOUNTER — Telehealth: Payer: Self-pay

## 2019-02-19 DIAGNOSIS — R300 Dysuria: Secondary | ICD-10-CM | POA: Diagnosis not present

## 2019-02-19 DIAGNOSIS — R3915 Urgency of urination: Secondary | ICD-10-CM | POA: Diagnosis not present

## 2019-02-19 DIAGNOSIS — N390 Urinary tract infection, site not specified: Secondary | ICD-10-CM | POA: Diagnosis not present

## 2019-02-19 DIAGNOSIS — R8279 Other abnormal findings on microbiological examination of urine: Secondary | ICD-10-CM | POA: Diagnosis not present

## 2019-02-19 NOTE — Telephone Encounter (Signed)
Spoke to patient Dr.Croitoru reviewed B/P readings.He advised no change, continue same medications.Continue to monitor B/P and send readings in 2 weeks for Dr.Croitoru to review.

## 2019-02-21 ENCOUNTER — Other Ambulatory Visit: Payer: Self-pay | Admitting: Cardiovascular Disease

## 2019-02-28 ENCOUNTER — Other Ambulatory Visit: Payer: Self-pay | Admitting: Cardiovascular Disease

## 2019-04-28 ENCOUNTER — Encounter: Payer: Medicare Other | Admitting: Cardiovascular Disease

## 2019-06-23 ENCOUNTER — Other Ambulatory Visit: Payer: Self-pay

## 2019-06-23 MED ORDER — AMLODIPINE BESYLATE 5 MG PO TABS: 5.0000 mg | ORAL_TABLET | Freq: Every day | ORAL | 1 refills | Status: DC

## 2019-06-23 NOTE — Telephone Encounter (Signed)
Rx(s) sent to pharmacy electronically.  

## 2019-08-24 ENCOUNTER — Other Ambulatory Visit: Payer: Self-pay | Admitting: Cardiovascular Disease

## 2019-08-25 ENCOUNTER — Other Ambulatory Visit: Payer: Self-pay

## 2019-08-25 MED ORDER — ENALAPRIL MALEATE 20 MG PO TABS: ORAL_TABLET | ORAL | 1 refills | Status: DC

## 2019-08-30 ENCOUNTER — Other Ambulatory Visit: Payer: Self-pay

## 2019-08-30 MED ORDER — ENALAPRIL MALEATE 20 MG PO TABS: ORAL_TABLET | ORAL | 1 refills | Status: DC

## 2019-09-03 DIAGNOSIS — E7800 Pure hypercholesterolemia, unspecified: Secondary | ICD-10-CM | POA: Diagnosis not present

## 2019-09-03 DIAGNOSIS — I10 Essential (primary) hypertension: Secondary | ICD-10-CM | POA: Diagnosis not present

## 2019-09-06 ENCOUNTER — Other Ambulatory Visit: Payer: Self-pay

## 2019-09-06 ENCOUNTER — Encounter: Payer: Self-pay | Admitting: Cardiovascular Disease

## 2019-09-06 ENCOUNTER — Telehealth: Payer: Self-pay | Admitting: Cardiovascular Disease

## 2019-09-06 ENCOUNTER — Ambulatory Visit (INDEPENDENT_AMBULATORY_CARE_PROVIDER_SITE_OTHER): Payer: Medicare Other | Admitting: Cardiovascular Disease

## 2019-09-06 VITALS — BP 120/82 | HR 55 | Temp 96.9°F | Ht 66.0 in | Wt 170.0 lb

## 2019-09-06 DIAGNOSIS — Z01812 Encounter for preprocedural laboratory examination: Secondary | ICD-10-CM

## 2019-09-06 DIAGNOSIS — E7800 Pure hypercholesterolemia, unspecified: Secondary | ICD-10-CM | POA: Diagnosis not present

## 2019-09-06 DIAGNOSIS — I712 Thoracic aortic aneurysm, without rupture, unspecified: Secondary | ICD-10-CM

## 2019-09-06 DIAGNOSIS — Z950 Presence of cardiac pacemaker: Secondary | ICD-10-CM

## 2019-09-06 DIAGNOSIS — I10 Essential (primary) hypertension: Secondary | ICD-10-CM | POA: Diagnosis not present

## 2019-09-06 DIAGNOSIS — I7121 Aneurysm of the ascending aorta, without rupture: Secondary | ICD-10-CM

## 2019-09-06 DIAGNOSIS — I2510 Atherosclerotic heart disease of native coronary artery without angina pectoris: Secondary | ICD-10-CM

## 2019-09-06 NOTE — Telephone Encounter (Signed)
New Message   Patient is calling because when he weighed at the office today he was 170lbs but his AVS says 175.

## 2019-09-06 NOTE — Telephone Encounter (Signed)
Left a message for the patient to call back.  The weight has been fixed in his chart for him.

## 2019-09-06 NOTE — Progress Notes (Signed)
Cardiology Office Note    Date:  09/06/2019   ID:  ATA ABBITT, DOB [birth year], MRN [id number]  PCP:  [name], MD  Cardiologist:   Sanda Klein, MD   Chief complaint: Pacemaker malfunction, aortic aneurysm  History of Present Illness:  Gary [name] is a 85 y.o. male  for pacemaker follow-up as well as follow-up on a moderate-sized ascending aortic aneurysm, hypertension and hyperlipidemia.   He feels great.  He is slowly losing weight.  Using his home scale recorded weight of 167 pounds his BMI is now only 27.  Optimal weight would be 155 pounds or less. The patient specifically denies any chest pain at rest exertion, dyspnea at rest or with exertion, orthopnea, paroxysmal nocturnal dyspnea, syncope, palpitations, focal neurological deficits, intermittent claudication, lower extremity edema, unexplained weight gain, cough, hemoptysis or wheezing.  He has a dual chamber St. Jude Zephyr XL that was implanted in 2009 but the leads in use are still the original unipolar leads implanted in 1987. His pacemaker was implanted for episodes of asystole that occurred while he was on treatment with Thorazine and Propulsid for intractable hiccups.  The function of both the atrial and the ventricular unipolar leads has steadily deteriorated since their implantation in 1987 and both leads are now unusable due to high pacing thresholds, low impedance and sensing artifact consistent with insulation breach.  The device was reprogrammed ODO to avoid what will become inevitable unnecessary pacing.  It is been about a year since we performed the reprogramming and he has not had syncope or near syncope since.  The device has not recorded any episodes of high ventricular or atrial rate.  There is still probably 3 years of battery left.  His ascending aortic aneurysm has been stable in size for the last 10 years so we have decided to monitor it on an every other year basis.  We will schedule him for  CT angiogram next year.  Unfortunately he has had to close down [employer] since they have no customers in the setting of the coronavirus pandemic.    Past Medical History:  Diagnosis Date  . Ascending aortic aneurysm ([place]) 01/13/2014   stable at 4.7 cm diameter since 2009  . Asystole x2, s/p pacemaker 01/13/2014  . Bradycardia    permanent pacemaker 11/22/08 St.Jude  . HTN (hypertension) 01/13/2014  . Hyperlipidemia   . Hypertension   . Pacemaker - leads 1987, last generator 2009 St. Jude dual-chamber 01/13/2014   Atrial lead model 433-01, ventricular lead model 431-01 implanted November 1987 Right ventricular lead dysfunction with low impedance and mediocre sensing but satisfactory pacing threshold  . Pacemaker lead malfunction 01/13/2014   Chronic low impedance and poor sensing of the ventricular lead    Past Surgical History:  Procedure Laterality Date  . PERMANENT PACEMAKER GENERATOR CHANGE  11/22/08   ST. Jude  . PERMANENT PACEMAKER INSERTION  03/04/97   ST. Jude  . US ECHOCARDIOGRAPHY  05/16/11   AS mild to mod.,TR mild to mod., EF => 55%.    Current Medications: Outpatient Medications Prior to Visit  Medication Sig Dispense Refill  . amLODipine (NORVASC) 5 MG tablet Take 1 tablet (5 mg total) by mouth daily. 90 tablet 1  . aspirin 81 MG tablet Take 81 mg by mouth daily.    . enalapril (VASOTEC) 20 MG tablet TAKE 1 TABLET(20 MG) BY MOUTH TWICE DAILY 180 tablet 1  . metoprolol succinate (TOPROL-XL) 50 MG 24 hr tablet TAKE 1  TABLET(50 MG) BY MOUTH DAILY 90 tablet 0  . simvastatin (ZOCOR) 20 MG tablet TAKE 1 TABLET BY MOUTH EVERY NIGHT AT BEDTIME 30 tablet 6   No facility-administered medications prior to visit.      Allergies:   Gentamycin [gentamicin sulfate], Hydrocodone, and Aspirin   Social History   Socioeconomic History  . Marital status: Married    Spouse name: Not on file  . Number of children: Not on file  . Years of education: Not on file  . Highest  education level: Not on file  Occupational History  . Not on file  Social Needs  . Financial resource strain: Not on file  . Food insecurity    Worry: Not on file    Inability: Not on file  . Transportation needs    Medical: Not on file    Non-medical: Not on file  Tobacco Use  . Smoking status: Never Smoker  . Smokeless tobacco: Never Used  Substance and Sexual Activity  . Alcohol use: Yes    Comment: occas.  . Drug use: No  . Sexual activity: Not on file  Lifestyle  . Physical activity    Days per week: Not on file    Minutes per session: Not on file  . Stress: Not on file  Relationships  . Social Herbalist on phone: Not on file    Gets together: Not on file    Attends religious service: Not on file    Active member of club or organization: Not on file    Attends meetings of clubs or organizations: Not on file    Relationship status: Not on file  Other Topics Concern  . Not on file  Social History Narrative  . Not on file     Family History:  The patient's family history includes Cancer in his mother; Heart attack in his father.   ROS:   Please see the history of present illness.    ROS all other systems were reviewed and are negative  PHYSICAL EXAM:   VS:  BP 120/82   Pulse (!) 55   Temp (!) 96.9 F (36.1 C)   Ht 5\' 6"  (1.676 m)   Wt 175 lb (79.4 kg)   SpO2 97%   BMI 28.25 kg/m      General: Alert, oriented x3, no distress, mildly overweight, appears younger than his stated age Head: no evidence of trauma, PERRL, EOMI, no exophtalmos or lid lag, no myxedema, no xanthelasma; normal ears, nose and oropharynx Neck: normal jugular venous pulsations and no hepatojugular reflux; brisk carotid pulses without delay and no carotid bruits Chest: clear to auscultation, no signs of consolidation by percussion or palpation, normal fremitus, symmetrical and full respiratory excursions Cardiovascular: normal position and quality of the apical impulse,  regular rhythm, normal first and second heart sounds, no murmurs, rubs or gallops Abdomen: no tenderness or distention, no masses by palpation, no abnormal pulsatility or arterial bruits, normal bowel sounds, no hepatosplenomegaly Extremities: no clubbing, cyanosis or edema; 2+ radial, ulnar and brachial pulses bilaterally; 2+ right femoral, posterior tibial and dorsalis pedis pulses; 2+ left femoral, posterior tibial and dorsalis pedis pulses; no subclavian or femoral bruits Neurological: grossly nonfocal Psych: Normal mood and affect     Wt Readings from Last 3 Encounters:  09/06/19 175 lb (79.4 kg)  10/20/18 175 lb 12.8 oz (79.7 kg)  04/17/18 171 lb 6.4 oz (77.7 kg)      Studies/Labs Reviewed:   EKG:  EKG is ordered today.  ECG today shows normal sinus rhythm and is a normal tracing.  Even the PR interval is now shorter in the normal range. Recent Labs:   Lipid Panel    Component Value Date/Time   CHOL 140 02/09/2015 1157   TRIG 83 02/09/2015 1157   HDL 49 02/09/2015 1157   CHOLHDL 2.9 02/09/2015 1157   VLDL 17 02/09/2015 1157   LDLCALC 74 02/09/2015 1157   06/19/2017 labs from Dr. Brigitte Pulse HDL 49, LDL 80, total cholesterol 145, TG 93, glucose 88, creatinine 1.02  ASSESSMENT:    1. Pacemaker   2. Atherosclerosis of native coronary artery of native heart without angina pectoris   3. Thoracic aortic aneurysm without rupture ([place])   4. Ascending aortic aneurysm ([place])   5. Pre-procedure lab exam      PLAN:  In order of problems listed above:  1. Ascending aortic aneurysm: Most recent CT of the chest showed a stable sized ascending aortic aneurysm at 4.8 cm in diameter, will monitor on an every other year basis.  Scheduled for CT angiogram in April 2021.  I told him that if the coronavirus pandemic is still in full swing and he is reluctant to have the test done we could delay for 1 or several months 2. Coronary atherosclerosis: He has a very active lifestyle and does not  have angina pectoris. 3. Pacemaker - leads 1987, last generator 2009 St. Jude dual-chamber: The atrial and ventricular leads are now both nonfunctional.  It is been a year since we essentially turned his pacemaker off and he has not had syncope or near syncope.  If he should develop a new indication for pacing in the future, the easiest way to handle it would be to implant an entirely new system from the left subclavian approach. 4. HTN (hypertension) excellent blood pressure control 5. Hyperlipidemia: Labs followed by Dr. Brigitte Pulse.    Medication Adjustments/Labs and Tests Ordered: Current medicines are reviewed at length with the patient today.  Concerns regarding medicines are outlined above.  Medication changes, Labs and Tests ordered today are listed in the Patient Instructions below. Patient Instructions  Medication Instructions:  Your physician recommends that you continue on your current medications as directed. Please refer to the Current Medication list given to you today.  If you need a refill on your cardiac medications before your next appointment, please call your pharmacy.   Lab work: BMET - to be completed prior to CT If you have labs (blood work) drawn today and your tests are completely normal, you will receive your results only by: Marland Kitchen MyChart Message (if you have MyChart) OR . A paper copy in the mail If you have any lab test that is abnormal or we need to change your treatment, we will call you to review the results.  Testing/Procedures: CT angiogram chest/aorta @ [address] 3rd floor - April 2021  Follow-Up: At [facility], you and your health needs are our priority.  As part of our continuing mission to provide you with exceptional heart care, we have created designated Provider Care Teams.  These Care Teams include your primary Cardiologist (physician) and Advanced Practice Providers (APPs -  Physician Assistants and Nurse Practitioners) who all work together  to provide you with the care you need, when you need it. You will need a follow up appointment in 12 months.  Please call our office 2 months in advance to schedule this appointment.  You may see Dani Gobble  Musa Rewerts, MD or one of the following Advanced Practice Providers on your designated Care Team: Arnold Line, [place] . Fabian Sharp, PA-C  Any Other Special Instructions Will Be Listed Below (If Applicable).       Signed, Sanda Klein, MD  09/06/2019 3:33 PM    [address], [place], [place]  [address] Phone: [phone]; Fax: [phone]

## 2019-09-06 NOTE — Patient Instructions (Signed)
Medication Instructions:  Your physician recommends that you continue on your current medications as directed. Please refer to the Current Medication list given to you today.  If you need a refill on your cardiac medications before your next appointment, please call your pharmacy.   Lab work: BMET - to be completed prior to CT If you have labs (blood work) drawn today and your tests are completely normal, you will receive your results only by: Marland Kitchen MyChart Message (if you have MyChart) OR . A paper copy in the mail If you have any lab test that is abnormal or we need to change your treatment, we will call you to review the results.  Testing/Procedures: CT angiogram chest/aorta @ [address] 3rd floor - April 2021  Follow-Up: At [facility], you and your health needs are our priority.  As part of our continuing mission to provide you with exceptional heart care, we have created designated Provider Care Teams.  These Care Teams include your primary Cardiologist (physician) and Advanced Practice Providers (APPs -  Physician Assistants and Nurse Practitioners) who all work together to provide you with the care you need, when you need it. You will need a follow up appointment in 12 months.  Please call our office 2 months in advance to schedule this appointment.  You may see Sanda Klein, MD or one of the following Advanced Practice Providers on your designated Care Team: [place], [place] . Fabian Sharp, PA-C  Any Other Special Instructions Will Be Listed Below (If Applicable).

## 2019-09-08 DIAGNOSIS — I10 Essential (primary) hypertension: Secondary | ICD-10-CM | POA: Diagnosis not present

## 2019-09-08 DIAGNOSIS — Z7189 Other specified counseling: Secondary | ICD-10-CM | POA: Diagnosis not present

## 2019-09-08 DIAGNOSIS — Z950 Presence of cardiac pacemaker: Secondary | ICD-10-CM | POA: Diagnosis not present

## 2019-09-08 DIAGNOSIS — R270 Ataxia, unspecified: Secondary | ICD-10-CM | POA: Diagnosis not present

## 2019-09-08 DIAGNOSIS — Z0000 Encounter for general adult medical examination without abnormal findings: Secondary | ICD-10-CM | POA: Diagnosis not present

## 2019-09-08 DIAGNOSIS — I495 Sick sinus syndrome: Secondary | ICD-10-CM | POA: Diagnosis not present

## 2019-09-08 DIAGNOSIS — E7800 Pure hypercholesterolemia, unspecified: Secondary | ICD-10-CM | POA: Diagnosis not present

## 2019-09-08 DIAGNOSIS — I712 Thoracic aortic aneurysm, without rupture: Secondary | ICD-10-CM | POA: Diagnosis not present

## 2019-09-17 DIAGNOSIS — R3915 Urgency of urination: Secondary | ICD-10-CM | POA: Diagnosis not present

## 2019-09-17 DIAGNOSIS — R310 Gross hematuria: Secondary | ICD-10-CM | POA: Diagnosis not present

## 2019-09-17 DIAGNOSIS — N401 Enlarged prostate with lower urinary tract symptoms: Secondary | ICD-10-CM | POA: Diagnosis not present

## 2019-09-18 DIAGNOSIS — Z23 Encounter for immunization: Secondary | ICD-10-CM | POA: Diagnosis not present

## 2019-09-27 ENCOUNTER — Other Ambulatory Visit: Payer: Self-pay

## 2019-09-27 MED ORDER — SIMVASTATIN 20 MG PO TABS: 20.0000 mg | ORAL_TABLET | Freq: Every day | ORAL | 6 refills | Status: DC

## 2019-09-28 ENCOUNTER — Other Ambulatory Visit: Payer: Self-pay | Admitting: *Deleted

## 2019-10-13 ENCOUNTER — Other Ambulatory Visit: Payer: Self-pay | Admitting: Cardiovascular Disease

## 2019-10-14 ENCOUNTER — Other Ambulatory Visit: Payer: Self-pay | Admitting: Advanced Practice Midwife

## 2019-11-21 ENCOUNTER — Other Ambulatory Visit: Payer: Self-pay | Admitting: Cardiovascular Disease

## 2019-11-26 ENCOUNTER — Other Ambulatory Visit: Payer: Self-pay | Admitting: *Deleted

## 2019-11-26 NOTE — Telephone Encounter (Signed)
Patients wants to know if Dr.C can refill her husband omeprazole 20 mg once a day.Medication is not on patient current list

## 2019-11-26 NOTE — Telephone Encounter (Signed)
Yes please omeprazole 20 mg once daily, #90, refills 3

## 2019-11-30 ENCOUNTER — Other Ambulatory Visit: Payer: Self-pay | Admitting: *Deleted

## 2019-11-30 MED ORDER — OMEPRAZOLE 20 MG PO CPDR: 20.0000 mg | DELAYED_RELEASE_CAPSULE | Freq: Every day | ORAL | 3 refills | Status: DC

## 2019-11-30 NOTE — Telephone Encounter (Signed)
Spoke with wife to verify the right pharmacy to send  RX to.  [facility] [address]  Day supply 3 refills

## 2019-12-22 ENCOUNTER — Other Ambulatory Visit: Payer: Self-pay

## 2019-12-22 MED ORDER — AMLODIPINE BESYLATE 5 MG PO TABS: 5.0000 mg | ORAL_TABLET | Freq: Every day | ORAL | 1 refills | Status: DC

## 2020-03-22 DIAGNOSIS — Z961 Presence of intraocular lens: Secondary | ICD-10-CM | POA: Diagnosis not present

## 2020-03-22 DIAGNOSIS — H5213 Myopia, bilateral: Secondary | ICD-10-CM | POA: Diagnosis not present

## 2020-03-23 DIAGNOSIS — Z01812 Encounter for preprocedural laboratory examination: Secondary | ICD-10-CM | POA: Diagnosis not present

## 2020-03-24 ENCOUNTER — Encounter: Payer: Self-pay | Admitting: *Deleted

## 2020-03-24 ENCOUNTER — Other Ambulatory Visit: Payer: Medicare Other

## 2020-03-24 LAB — BASIC METABOLIC PANEL
BUN/Creatinine Ratio: 20 (ref 10–24)
BUN: 18 mg/dL (ref 8–27)
CO2: 25 mmol/L (ref 20–29)
Calcium: 9.1 mg/dL (ref 8.6–10.2)
Chloride: 104 mmol/L (ref 96–106)
Creatinine, Ser: 0.9 mg/dL (ref 0.76–1.27)
GFR calc Af Amer: 90 mL/min/{1.73_m2} (ref >59)
GFR calc non Af Amer: 78 mL/min/{1.73_m2} (ref >59)
Glucose: 74 mg/dL (ref 65–99)
Potassium: 5.1 mmol/L (ref 3.5–5.2)
Sodium: 142 mmol/L (ref 134–144)

## 2020-03-27 ENCOUNTER — Other Ambulatory Visit: Payer: Self-pay | Admitting: Cardiovascular Disease

## 2020-03-27 ENCOUNTER — Inpatient Hospital Stay: Admission: RE | Admit: 2020-03-27 | Payer: Medicare Other | Source: Ambulatory Visit

## 2020-03-27 DIAGNOSIS — I7121 Aneurysm of the ascending aorta, without rupture: Secondary | ICD-10-CM

## 2020-03-27 DIAGNOSIS — I712 Thoracic aortic aneurysm, without rupture, unspecified: Secondary | ICD-10-CM

## 2020-03-28 ENCOUNTER — Inpatient Hospital Stay: Admission: RE | Admit: 2020-03-28 | Payer: Medicare Other | Source: Ambulatory Visit

## 2020-04-03 DIAGNOSIS — R5383 Other fatigue: Secondary | ICD-10-CM | POA: Diagnosis not present

## 2020-04-04 DIAGNOSIS — R5383 Other fatigue: Secondary | ICD-10-CM | POA: Diagnosis not present

## 2020-04-04 DIAGNOSIS — R1011 Right upper quadrant pain: Secondary | ICD-10-CM | POA: Diagnosis not present

## 2020-04-06 DIAGNOSIS — R0781 Pleurodynia: Secondary | ICD-10-CM | POA: Diagnosis not present

## 2020-04-12 ENCOUNTER — Ambulatory Visit
Admission: RE | Admit: 2020-04-12 | Discharge: 2020-04-12 | Disposition: A | Discharge status: Home / Self Care | Payer: Medicare Other | Source: Ambulatory Visit | Attending: Cardiovascular Disease | Admitting: Cardiovascular Disease

## 2020-04-12 DIAGNOSIS — I712 Thoracic aortic aneurysm, without rupture, unspecified: Secondary | ICD-10-CM

## 2020-04-12 DIAGNOSIS — I7121 Aneurysm of the ascending aorta, without rupture: Secondary | ICD-10-CM

## 2020-04-12 MED ORDER — IOPAMIDOL (ISOVUE-370) INJECTION 76%
75.0000 mL | Freq: Once | INTRAVENOUS | Status: AC | PRN
  Administered 2020-04-12: 75 mL via INTRAVENOUS

## 2020-04-14 ENCOUNTER — Telehealth: Payer: Self-pay | Admitting: *Deleted

## 2020-04-14 NOTE — Telephone Encounter (Signed)
Left a message for the patient to call back.  

## 2020-04-14 NOTE — Telephone Encounter (Signed)
-----   Message from Sanda Klein, MD sent at 04/12/2020  5:27 PM EDT ----- The aortic aneurysm has not changed in size since 2009.  I think monitoring for changes in size every other year is sufficient.  As before, the CT scan also shows a gallstone and a hiatal hernia, neither of which need additional evaluation or treatment, unless they are causing symptoms/complaints.  The radiologist saw some abnormalities in the pancreas and in the liver and recommended an MRI to check this out. Unfortunately, it is not safe to do an MRI with the pacemaker and especially with the old leads that he has from 68.  I am not sure if there is an alternative way to evaluate the problems identified on th CT. An ultrasound of the abdomen would be safe, but I do not know if it will clarify the problem. Is he having any abdominal pain or other abdominal symptoms?

## 2020-04-25 ENCOUNTER — Other Ambulatory Visit: Payer: Self-pay

## 2020-04-25 MED ORDER — SIMVASTATIN 20 MG PO TABS: 20.0000 mg | ORAL_TABLET | Freq: Every day | ORAL | 1 refills | Status: DC

## 2020-04-25 NOTE — Telephone Encounter (Signed)
Patient made aware of results and verbalized understanding.  The patient and the wife have been made aware of the results and verbalized their understanding. The patient stated that the only problem he has is from heart burn which is relieved with Tums. He also avoids food that triggers the heart burn as well.

## 2020-04-28 ENCOUNTER — Other Ambulatory Visit: Payer: Self-pay

## 2020-04-28 MED ORDER — SIMVASTATIN 20 MG PO TABS: 20.0000 mg | ORAL_TABLET | Freq: Every day | ORAL | 1 refills | Status: DC

## 2020-05-02 ENCOUNTER — Ambulatory Visit
Admission: RE | Admit: 2020-05-02 | Discharge: 2020-05-02 | Disposition: A | Discharge status: Home / Self Care | Payer: Medicare Other | Source: Ambulatory Visit | Attending: Family Medicine | Admitting: Family Medicine

## 2020-05-02 ENCOUNTER — Other Ambulatory Visit: Payer: Self-pay | Admitting: Family Medicine

## 2020-05-02 DIAGNOSIS — K7689 Other specified diseases of liver: Secondary | ICD-10-CM

## 2020-05-02 DIAGNOSIS — K8020 Calculus of gallbladder without cholecystitis without obstruction: Secondary | ICD-10-CM | POA: Diagnosis not present

## 2020-05-02 MED ORDER — IOPAMIDOL (ISOVUE-300) INJECTION 61%
100.0000 mL | Freq: Once | INTRAVENOUS | Status: AC | PRN
  Administered 2020-05-02: 100 mL via INTRAVENOUS

## 2020-05-03 DIAGNOSIS — K219 Gastro-esophageal reflux disease without esophagitis: Secondary | ICD-10-CM | POA: Diagnosis not present

## 2020-05-03 DIAGNOSIS — R160 Hepatomegaly, not elsewhere classified: Secondary | ICD-10-CM | POA: Diagnosis not present

## 2020-05-03 DIAGNOSIS — K869 Disease of pancreas, unspecified: Secondary | ICD-10-CM | POA: Diagnosis not present

## 2020-05-04 ENCOUNTER — Telehealth: Payer: Self-pay | Admitting: Nurse Practitioner

## 2020-05-04 NOTE — Progress Notes (Addendum)
New Hematology/Oncology Consult   Requesting MD: Dr. Waldron Labs  [phone]   Reason for Consult: Pancreas mass, liver lesions  HPI: Mr. Bok is an 86 year old man who underwent a chest CT on 04/12/2020 for follow-up of a thoracic aortic aneurysm.  In addition to the aneurysm, multiple liver lesions and a lesion in the tail of the pancreas were noted.  CT abdomen/pelvis 05/02/2020 showed a hypoenhancing lesion of the pancreatic tail measuring approximately 2.4 x 1.9 cm; enlarged portacaval lymph nodes measuring up to 1.9 x 1.4 cm; numerous hypodense lesions throughout the liver, index lesion of the liver dome measured 1.7 x 1.2 cm.  He was referred to the [facility] for further evaluation.       Past Medical History:  Diagnosis Date  . Ascending aortic aneurysm ([place]) 01/13/2014   stable at 4.7 cm diameter since 2009  . Asystole x2, s/p pacemaker 01/13/2014  . Bradycardia    permanent pacemaker 11/22/08 St.Jude  . HTN (hypertension) 01/13/2014  . Hyperlipidemia   . Hypertension   . Pacemaker - leads 1987, last generator 2009 St. Jude dual-chamber 01/13/2014   Atrial lead model 433-01, ventricular lead model 431-01 implanted November 1987 Right ventricular lead dysfunction with low impedance and mediocre sensing but satisfactory pacing threshold  . Pacemaker lead malfunction 01/13/2014   Chronic low impedance and poor sensing of the ventricular lead  :   Past Surgical History:  Procedure Laterality Date  . PERMANENT PACEMAKER GENERATOR CHANGE  11/22/08   [employer] Jude  . PERMANENT PACEMAKER INSERTION  03/04/97   [employer] Jude  . US ECHOCARDIOGRAPHY  05/16/11   AS mild to mod.,TR mild to mod., EF => 55%.  :   Current Outpatient Medications:  .  amLODipine (NORVASC) 5 MG tablet, Take 1 tablet (5 mg total) by mouth daily., Disp: 90 tablet, Rfl: 1 .  aspirin 81 MG tablet, Take 81 mg by mouth daily., Disp: , Rfl:  .  enalapril (VASOTEC) 20 MG tablet, TAKE 1 TABLET(20 MG) BY MOUTH TWICE  DAILY, Disp: 180 tablet, Rfl: 1 .  metoprolol succinate (TOPROL-XL) 50 MG 24 hr tablet, TAKE 1 TABLET(50 MG) BY MOUTH DAILY, Disp: 90 tablet, Rfl: 2 .  metoprolol succinate (TOPROL-XL) 50 MG 24 hr tablet, TAKE 1 TABLET(50 MG) BY MOUTH DAILY, Disp: 90 tablet, Rfl: 3 .  metoprolol succinate (TOPROL-XL) 50 MG 24 hr tablet, TAKE 1 TABLET(50 MG) BY MOUTH DAILY, Disp: 90 tablet, Rfl: 3 .  omeprazole (PRILOSEC) 20 MG capsule, Take 1 capsule (20 mg total) by mouth daily., Disp: 90 capsule, Rfl: 3 .  simvastatin (ZOCOR) 20 MG tablet, Take 1 tablet (20 mg total) by mouth at bedtime., Disp: 30 tablet, Rfl: 1:  :   Allergies  Allergen Reactions  . Gentamycin [Gentamicin Sulfate]   . Hydrocodone   . Aspirin Other (See Comments)    High doses cause stomach upset  :  FH: Mother deceased with uterine cancer.  SOCIAL HISTORY: He lives in [place] with his wife.  He has 4 children.  He is retired.  He previously operated a Chiropractor.  No tobacco use.  No significant EtOH intake.  Review of Systems: He reports intentional weight loss of 10 pounds recently.  He attributes the weight loss to a new diet.  His appetite is good overall but has been a little diminished over the past month.  His son has noticed generalized weakness over the past few weeks.  No fevers or sweats.  No abdominal pain.  No dysphagia.  For several weeks he has been having intermittent "heartburn".  No nausea or vomiting.  No change in bowel habits.  No bleeding or pain with bowel movements.  No cough, shortness of breath, chest pain.  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  Physical Exam:  Blood pressure (!) 168/93, pulse 73, temperature 97.7 F (36.5 C), temperature source Temporal, resp. rate 18, height 5\' 6"  (1.676 m), weight 165 lb 8 oz (75.1 kg), SpO2 100 %.  HEENT: Neck without mass.  No thrush or ulcers. Lungs: Lungs clear bilaterally. Cardiac: Regular rate and  rhythm. Abdomen: Abdomen soft and nontender.  No hepatosplenomegaly.  No mass.  Vascular: Trace edema at the ankles/lower legs bilaterally. Lymph nodes: No palpable cervical, supraclavicular or axillary lymph nodes. Neurologic: Alert and oriented. Skin: Warm and dry.  No rash.  LABS: None  RADIOLOGY:  CT ABDOMEN PELVIS W WO CONTRAST  Result Date: 05/02/2020 CLINICAL DATA:  Evaluate pancreatic and liver lesions identified by prior CT chest EXAM: CT ABDOMEN AND PELVIS WITHOUT AND WITH CONTRAST TECHNIQUE: Multidetector CT imaging of the abdomen and pelvis was performed following the standard protocol before and following the bolus administration of intravenous contrast. CONTRAST:  166mL ISOVUE-300 IOPAMIDOL (ISOVUE-300) INJECTION 61% COMPARISON:  CT chest, 04/12/2020, CT lumbar spine, 03/31/2017 FINDINGS: Lower chest: No acute abnormality. Moderate hiatal hernia with intrathoracic position of the gastric fundus. Hepatobiliary: Numerous hypodense lesions throughout the liver, an index lesion of the liver dome measuring 1.7 x 1.2 cm (series 18, image 9). Rim calcified gallstone in the gallbladder. No gallbladder wall thickening, or biliary dilatation. Pancreas: There is a hypoenhancing lesion of the pancreatic tail measuring approximately 2.4 x 1.9 cm (series 4, image 46). No pancreatic ductal dilatation or surrounding inflammatory changes. Spleen: Normal in size without significant abnormality. Adrenals/Urinary Tract: Adrenal glands are unremarkable. Kidneys are normal, without renal calculi, solid lesion, or hydronephrosis. Bladder is unremarkable. Stomach/Bowel: Stomach is within normal limits. Appendix appears normal. No evidence of bowel wall thickening, distention, or inflammatory changes. Descending and sigmoid diverticulosis. Vascular/Lymphatic: Aortic atherosclerosis. Enlarged portacaval lymph nodes measuring up to 1.9 x 1.4 cm (series 11, image 62). Reproductive: No mass or other significant  abnormality. Other: No abdominal wall hernia or abnormality. No abdominopelvic ascites. Musculoskeletal: No acute or significant osseous findings. Nonacute superior endplate deformity of L2. IMPRESSION: 1. There is a hypoenhancing lesion of the pancreatic tail measuring approximately 2.4 x 1.9 cm. Findings are highly concerning for pancreatic adenocarcinoma. 2. Numerous hypodense lesions throughout the liver. 3. Enlarged portacaval lymph nodes. 4. Constellation of findings is consistent with nodal and hepatic metastatic disease. 5. Moderate hiatal hernia with intrathoracic position of the gastric fundus. 6. Cholelithiasis. 7. Diverticulosis without evidence of acute diverticulitis. 8.  Aortic Atherosclerosis (ICD10-I70.0). These results will be called to the ordering clinician or representative by the Radiologist Assistant, and communication documented in the PACS or [employer]. Electronically Signed   By: Eddie Candle M.D.   On: 05/02/2020 12:16   CT ANGIO CHEST AORTA W/CM & OR WO/CM  Result Date: 04/12/2020 CLINICAL DATA:  86 year old male with history of thoracic aortic aneurysm. Follow-up study. EXAM: CT ANGIOGRAPHY CHEST WITH CONTRAST TECHNIQUE: Multidetector CT imaging of the chest was performed using the standard protocol during bolus administration of intravenous contrast. Multiplanar CT image reconstructions and MIPs were obtained to evaluate the vascular anatomy. CONTRAST:  11mL ISOVUE-370 IOPAMIDOL (ISOVUE-370) INJECTION 76% COMPARISON:  Chest CTA 04/09/2018. FINDINGS: Cardiovascular: Atherosclerosis of the thoracic aorta  with aneurysmal dilatation of the ascending thoracic aorta (4.7 cm in diameter). Mid aortic arch and descending thoracic aorta are normal in caliber measuring 2.9 cm and 2.5 cm in diameter respectively. No aortic dissection. No atherosclerotic calcifications are noted in the coronary arteries. Heart size is normal. There is no significant pericardial fluid, thickening or  pericardial calcification. Right sided pacemaker device in place with lead tips terminating in the right atrium and right ventricle. Mediastinum/Nodes: No pathologically enlarged mediastinal or hilar lymph nodes. Large hiatal hernia. No axillary lymphadenopathy. Lungs/Pleura: No suspicious appearing pulmonary nodules or masses are noted. No acute consolidative airspace disease. Trace right pleural effusion lying dependently. No left pleural effusion. Linear scarring and/or subsegmental atelectasis in the right lower lobe. Upper Abdomen: Numerous poorly defined hypovascular lesions within the liver, new compared to the prior study, largest of which is in segment 8 estimated to measure approximately 4.1 x 2.0 cm (axial image 120 of series 4). Peripherally calcified gallstone in the gallbladder measuring 2.1 x 1.6 cm. Hypovascular lesion in the tail of the pancreas (axial image 148 of series 4) measuring 2.3 x 1.9 cm. Musculoskeletal: There are no aggressive appearing lytic or blastic lesions noted in the visualized portions of the skeleton. Review of the MIP images confirms the above findings. IMPRESSION: 1. Aortic atherosclerosis with aneurysmal dilatation of the ascending thoracic aorta (4.7 cm in diameter). Ascending thoracic aortic aneurysm. Recommend semi-annual imaging followup by CTA or MRA and referral to cardiothoracic surgery if not already obtained. This recommendation follows 2010 ACCF/AHA/AATS/ACR/ASA/SCA/SCAI/SIR/STS/SVM Guidelines for the Diagnosis and Management of Patients With Thoracic Aortic Disease. Circulation. 2010; [id number]. Aortic aneurysm NOS (ICD10-I71.9) 2. Hypovascular lesion in the tail of the pancreas, new compared to the prior examination. In addition, there are multiple poorly defined hypovascular lesions scattered throughout the liver highly concerning for metastatic disease. Further evaluation with nonemergent abdominal MRI with and without IV gadolinium is strongly recommended  in the near future to better characterize these findings. 3. Trace right pleural effusion lying dependently. 4. Cholelithiasis without evidence of acute cholecystitis at this time. 5. Large hiatal hernia. Aortic Atherosclerosis (ICD10-I70.0). Aortic aneurysm NOS (ICD10-I71.9). Electronically Signed   By: Vinnie Langton M.D.   On: 04/12/2020 16:29    Assessment and Plan:   1. Pancreas tail lesion, multiple liver lesions   Chest CT 04/12/2020-ascending thoracic aortic aneurysm; hypovascular lesion tail of the pancreas, new compared to the prior examination.  Multiple poorly defined hypovascular lesions scattered throughout the liver   CT abdomen/pelvis 05/02/2020-hypoenhancing lesion of the pancreatic tail measuring approximately 2.4 x 1.9 cm; enlarged portacaval lymph nodes measuring up to 1.9 x 1.4 cm; numerous hypodense lesions throughout the liver, index lesion of the liver dome measuring 1.7 x 1.2 cm. 2. Thoracic aortic aneurysm 3. Hypertension 4. Hyperlipidemia 5. History of bradycardia status post permanent pacemaker 6. Mother had uterine cancer  Mr. Woolverton appears to have pancreas cancer metastatic to the liver.  Dr. Benay Spice reviewed the likely diagnosis with him and his son at today's visit.  We are referring him for biopsy of a liver lesion to confirm the diagnosis.  He will return for a follow-up appointment to discuss the biopsy results on 05/16/2020.  Patient seen with Dr. Benay Spice.  Ned Card, NP 05/05/2020, 3:24 PM   This was a shared visit with Ned Card.  Mr. Avino was interviewed and examined.  We reviewed CT images with Mr. Be sure and his son.  He was found to have a pancreas tail mass  and multiple liver lesions on a surveillance chest CT.  He appears to have metastatic pancreas cancer.  We reviewed the probable diagnosis, prognosis, and treatment options with him.  He appears asymptomatic.  He will be referred for a diagnostic biopsy.  We will request mismatch  repair protein testing on the liver biopsy.  We will consider systemic therapy with single agent gemcitabine or gemcitabine/Abraxane if he is confirmed to have metastatic pancreas cancer.  Julieanne Manson, MD

## 2020-05-04 NOTE — Telephone Encounter (Signed)
Received a new pt referral from Dr. Waldron Labs for pancreatic mass. Gary [name] has been scheduled to see Ned Card on 5/21 at 2:45pm. Appt date and time has been given to the pt's son. Aware to arrive 15 minutes early.

## 2020-05-05 ENCOUNTER — Other Ambulatory Visit: Payer: Self-pay

## 2020-05-05 ENCOUNTER — Inpatient Hospital Stay: Payer: Medicare Other | Attending: Oncology | Admitting: Nurse Practitioner

## 2020-05-05 ENCOUNTER — Telehealth: Payer: Self-pay | Admitting: Oncology

## 2020-05-05 ENCOUNTER — Encounter: Payer: Self-pay | Admitting: Nurse Practitioner

## 2020-05-05 ENCOUNTER — Encounter (INDEPENDENT_AMBULATORY_CARE_PROVIDER_SITE_OTHER): Payer: Self-pay

## 2020-05-05 VITALS — BP 168/93 | HR 73 | Temp 97.7°F | Resp 18 | Ht 66.0 in | Wt 165.5 lb

## 2020-05-05 DIAGNOSIS — K8689 Other specified diseases of pancreas: Secondary | ICD-10-CM

## 2020-05-05 DIAGNOSIS — K769 Liver disease, unspecified: Secondary | ICD-10-CM | POA: Diagnosis not present

## 2020-05-05 DIAGNOSIS — C252 Malignant neoplasm of tail of pancreas: Secondary | ICD-10-CM | POA: Diagnosis not present

## 2020-05-05 NOTE — Progress Notes (Signed)
Met with patient and his son today at his initial medical oncology appointment with Ned Card NP and Dr. Julieanne Manson.  I introduced myself and explained my role as nurse navigator.  They were given my card with my direct phone number and I encouraged them to call with questions or concerns.  They verbalized an understanding.

## 2020-05-05 NOTE — Telephone Encounter (Signed)
Scheduled per 05/21 los, patient received printed calender and after visit summary.

## 2020-05-08 ENCOUNTER — Encounter (HOSPITAL_COMMUNITY): Payer: Self-pay

## 2020-05-08 NOTE — Progress Notes (Signed)
Spoke with patient's son Gary [name] regarding liver biopsy.  This is now being done on Wednesday 5/26 to arrive at 11:30 at [facility], NPO past 7 am.  Must have a driver.  He verbalized an understanding and was very appreciative of the biopsy being done this week.

## 2020-05-08 NOTE — Progress Notes (Unsigned)
[name] Male, 86 y.o., Jul 14, 1932 MRN:  [id number] Phone:  [phone] [name]) PCP:  [name], MD Primary Cvg:  Medicare/Medicare Part A And B Next Appt With Oncology 05/16/2020 at 1:30 PM  RE: Biopsy Received: 2 days ago Message Contents  Gary Daft, MD  [name] for US guided liver lesion biopsy.   [name]   Previous Messages  ----- Message -----  From: [name]  Sent: 05/05/2020  5:31 PM EDT  To: Ir Procedure Requests  Subject: Biopsy                      Procedure Requested: US Biopsy (Liver)    Reason for Procedure: pancreas tail mass, liver lesions    Provider Requesting: Owens Shark  Provider Telephone: [phone]    Other Info: Rad exam in Epic

## 2020-05-09 ENCOUNTER — Other Ambulatory Visit: Payer: Self-pay

## 2020-05-09 ENCOUNTER — Other Ambulatory Visit: Payer: Self-pay | Admitting: Student

## 2020-05-09 ENCOUNTER — Other Ambulatory Visit: Payer: Self-pay | Admitting: Radiology

## 2020-05-09 MED ORDER — ENALAPRIL MALEATE 20 MG PO TABS: ORAL_TABLET | ORAL | 1 refills | Status: DC

## 2020-05-10 ENCOUNTER — Ambulatory Visit (HOSPITAL_COMMUNITY)
Admission: RE | Admit: 2020-05-10 | Discharge: 2020-05-10 | Disposition: A | Discharge status: Home / Self Care | Payer: Medicare Other | Source: Ambulatory Visit | Attending: Nurse Practitioner | Admitting: Nurse Practitioner

## 2020-05-10 ENCOUNTER — Other Ambulatory Visit: Payer: Self-pay

## 2020-05-10 ENCOUNTER — Ambulatory Visit (HOSPITAL_COMMUNITY)
Admission: RE | Admit: 2020-05-10 | Discharge: 2020-05-10 | Disposition: A | Discharge status: Home / Self Care | Payer: Medicare Other | Source: Ambulatory Visit | Attending: Oncology | Admitting: Oncology

## 2020-05-10 ENCOUNTER — Encounter (HOSPITAL_COMMUNITY): Payer: Self-pay

## 2020-05-10 DIAGNOSIS — Z7982 Long term (current) use of aspirin: Secondary | ICD-10-CM | POA: Insufficient documentation

## 2020-05-10 DIAGNOSIS — I10 Essential (primary) hypertension: Secondary | ICD-10-CM | POA: Diagnosis not present

## 2020-05-10 DIAGNOSIS — K769 Liver disease, unspecified: Secondary | ICD-10-CM

## 2020-05-10 DIAGNOSIS — E785 Hyperlipidemia, unspecified: Secondary | ICD-10-CM | POA: Insufficient documentation

## 2020-05-10 DIAGNOSIS — K8689 Other specified diseases of pancreas: Secondary | ICD-10-CM | POA: Diagnosis not present

## 2020-05-10 DIAGNOSIS — C229 Malignant neoplasm of liver, not specified as primary or secondary: Secondary | ICD-10-CM | POA: Insufficient documentation

## 2020-05-10 DIAGNOSIS — Z950 Presence of cardiac pacemaker: Secondary | ICD-10-CM | POA: Diagnosis not present

## 2020-05-10 DIAGNOSIS — Z79899 Other long term (current) drug therapy: Secondary | ICD-10-CM | POA: Diagnosis not present

## 2020-05-10 DIAGNOSIS — I712 Thoracic aortic aneurysm, without rupture: Secondary | ICD-10-CM | POA: Insufficient documentation

## 2020-05-10 DIAGNOSIS — K7689 Other specified diseases of liver: Secondary | ICD-10-CM | POA: Diagnosis not present

## 2020-05-10 HISTORY — DX: Family history of other specified conditions: Z84.89

## 2020-05-10 LAB — COMPREHENSIVE METABOLIC PANEL
ALT: 41 U/L (ref 0–44)
AST: 37 U/L (ref 15–41)
Albumin: 3.5 g/dL (ref 3.5–5.0)
Alkaline Phosphatase: 138 U/L — ABNORMAL HIGH (ref 38–126)
Anion gap: 11 (ref 5–15)
BUN: 17 mg/dL (ref 8–23)
CO2: 25 mmol/L (ref 22–32)
Calcium: 8.8 mg/dL — ABNORMAL LOW (ref 8.9–10.3)
Chloride: 104 mmol/L (ref 98–111)
Creatinine, Ser: 0.91 mg/dL (ref 0.61–1.24)
GFR calc Af Amer: >60 mL/min (ref >60)
GFR calc non Af Amer: >60 mL/min (ref >60)
Glucose, Bld: 97 mg/dL (ref 70–99)
Potassium: 4.2 mmol/L (ref 3.5–5.1)
Sodium: 140 mmol/L (ref 135–145)
Total Bilirubin: 1.1 mg/dL (ref 0.3–1.2)
Total Protein: 7.1 g/dL (ref 6.5–8.1)

## 2020-05-10 LAB — CBC
HCT: 40.4 % (ref 39.0–52.0)
Hemoglobin: 13.1 g/dL (ref 13.0–17.0)
MCH: 29.2 pg (ref 26.0–34.0)
MCHC: 32.4 g/dL (ref 30.0–36.0)
MCV: 90.2 fL (ref 80.0–100.0)
Platelets: 163 10*3/uL (ref 150–400)
RBC: 4.48 MIL/uL (ref 4.22–5.81)
RDW: 15.6 % — ABNORMAL HIGH (ref 11.5–15.5)
WBC: 9.9 10*3/uL (ref 4.0–10.5)
nRBC: 0 % (ref 0.0–0.2)

## 2020-05-10 LAB — APTT: aPTT: 29 seconds (ref 24–36)

## 2020-05-10 LAB — PROTIME-INR
INR: 1.1 (ref 0.8–1.2)
Prothrombin Time: 13.5 seconds (ref 11.4–15.2)

## 2020-05-10 MED ORDER — LIDOCAINE-EPINEPHRINE (PF) 2 %-1:200000 IJ SOLN
INTRAMUSCULAR | Status: AC
  Filled 2020-05-10: qty 20

## 2020-05-10 MED ORDER — LIDOCAINE-EPINEPHRINE (PF) 2 %-1:200000 IJ SOLN
INTRAMUSCULAR | Status: AC | PRN
  Administered 2020-05-10: 10 mL via INTRADERMAL

## 2020-05-10 MED ORDER — MIDAZOLAM HCL 2 MG/2ML IJ SOLN
INTRAMUSCULAR | Status: AC
  Filled 2020-05-10: qty 2

## 2020-05-10 MED ORDER — FENTANYL CITRATE (PF) 100 MCG/2ML IJ SOLN
INTRAMUSCULAR | Status: AC
  Filled 2020-05-10: qty 2

## 2020-05-10 MED ORDER — SODIUM CHLORIDE 0.9 % IV SOLN: INTRAVENOUS | Status: DC

## 2020-05-10 MED ORDER — MIDAZOLAM HCL 2 MG/2ML IJ SOLN
INTRAMUSCULAR | Status: AC | PRN
  Administered 2020-05-10 (×2): 0.5 mg via INTRAVENOUS

## 2020-05-10 MED ORDER — FENTANYL CITRATE (PF) 100 MCG/2ML IJ SOLN
INTRAMUSCULAR | Status: AC | PRN
  Administered 2020-05-10 (×2): 25 ug via INTRAVENOUS

## 2020-05-10 MED ORDER — GELATIN ABSORBABLE 12-7 MM EX MISC
CUTANEOUS | Status: AC
  Filled 2020-05-10: qty 1

## 2020-05-10 NOTE — Procedures (Signed)
Pre Procedure Dx: Liver lesions, worrisome for metastatic pancreatic cancer Post Procedural Dx: Same  Technically successful US guided biopsy of ill defined lesion within the left lobe of the liver.   EBL: None  No immediate complications.   Ronny Bacon, MD Pager #: [phone]

## 2020-05-10 NOTE — H&P (Signed)
Referring Physician(s): Sherrill,B  Supervising Physician: Sandi Mariscal  Patient Status:  [facility] OP  Chief Complaint: "I'm here for a liver biopsy"   Subjective: Patient familiar to IR service from left upper extremity PICC placement in 2007.  He has a history of a thoracic aortic aneurysm and during follow-up surveillance imaging was noted to have a hypovascular lesion in tail of pancreas in addition to multiple poorly defined hypovascular lesions scattered throughout the liver, and enlarged portacaval lymph nodes concerning for metastatic disease.  He has no known history of cancer and is a non-smoker.  He presents today for image guided liver lesion biopsy for further evaluation.  Additional medical history as below.  He is currently asymptomatic other than being hard of hearing.  Past Medical History:  Diagnosis Date  . Ascending aortic aneurysm ([place]) 01/13/2014   stable at 4.7 cm diameter since 2009  . Asystole x2, s/p pacemaker 01/13/2014  . Bradycardia    permanent pacemaker 11/22/08 [employer].Jude  . HTN (hypertension) 01/13/2014  . Hyperlipidemia   . Hypertension   . Pacemaker - leads 1987, last generator 2009 St. Jude dual-chamber 01/13/2014   Atrial lead model 433-01, ventricular lead model 431-01 implanted November 1987 Right ventricular lead dysfunction with low impedance and mediocre sensing but satisfactory pacing threshold  . Pacemaker lead malfunction 01/13/2014   Chronic low impedance and poor sensing of the ventricular lead   Past Surgical History:  Procedure Laterality Date  . PERMANENT PACEMAKER GENERATOR CHANGE  11/22/08   [facility]. Jude  . PERMANENT PACEMAKER INSERTION  03/04/97   [facility]. Jude  . US ECHOCARDIOGRAPHY  05/16/11   AS mild to mod.,TR mild to mod., EF => 55%.        Allergies: Gentamycin [gentamicin sulfate], Hydrocodone, and Aspirin  Medications: Prior to Admission medications   Medication Sig Start Date End Date Taking? Authorizing Provider  amLODipine  (NORVASC) 5 MG tablet Take 1 tablet (5 mg total) by mouth daily. 12/22/19   Croitoru, Mihai, MD  aspirin 81 MG tablet Take 81 mg by mouth daily.    [provider]  enalapril (VASOTEC) 20 MG tablet TAKE 1 TABLET(20 MG) BY MOUTH TWICE DAILY 05/09/20   Croitoru, Mihai, MD  metoprolol succinate (TOPROL-XL) 50 MG 24 hr tablet TAKE 1 TABLET(50 MG) BY MOUTH DAILY 11/22/19   Croitoru, Mihai, MD  metoprolol succinate (TOPROL-XL) 50 MG 24 hr tablet TAKE 1 TABLET(50 MG) BY MOUTH DAILY 11/22/19   Croitoru, Mihai, MD  metoprolol succinate (TOPROL-XL) 50 MG 24 hr tablet TAKE 1 TABLET(50 MG) BY MOUTH DAILY 11/22/19   Croitoru, Mihai, MD  omeprazole (PRILOSEC) 20 MG capsule Take 1 capsule (20 mg total) by mouth daily. 11/30/19   Croitoru, Mihai, MD  simvastatin (ZOCOR) 20 MG tablet Take 1 tablet (20 mg total) by mouth at bedtime. 04/28/20   Croitoru, Mihai, MD     Vital Signs: Blood pressure 160/82, temp 98, heart rate 70, respirations 18, O2 sat 100% room air   Physical Exam awake, alert.  Chest with slightly diminished breath sounds right base, left clear.  Right chest wall pacer in place.  Heart with regular rate and rhythm.  Abdomen soft, positive bowel sounds, nontender.  No significant lower extremity edema.  Imaging: No results found.  Labs:  CBC: No results for input(s): WBC, HGB, HCT, PLT in the last 8760 hours.  COAGS: No results for input(s): INR, APTT in the last 8760 hours.  BMP: Recent Labs    03/23/20 1428  NA 142  K 5.1  CL 104  CO2 25  GLUCOSE 74  BUN 18  CALCIUM 9.1  CREATININE 0.90  GFRNONAA 78  GFRAA 90    LIVER FUNCTION TESTS: No results for input(s): BILITOT, AST, ALT, ALKPHOS, PROT, ALBUMIN in the last 8760 hours.  Assessment and Plan: 86 yo male with history of a thoracic aortic aneurysm and during follow-up surveillance imaging was noted to have a hypovascular lesion in tail of pancreas in addition to multiple poorly defined hypovascular lesions scattered  throughout the liver, and enlarged portacaval lymph nodes concerning for metastatic disease.  He has no known history of cancer and is a non-smoker.  He presents today for image guided liver lesion biopsy for further evaluation. Risks and benefits of procedure was discussed with the patient  including, but not limited to bleeding, infection, damage to adjacent structures or low yield requiring additional tests.  All of the questions were answered and there is agreement to proceed.  Consent signed and in chart.     Electronically Signed: D. Rowe Robert, PA-C 05/10/2020, 11:42 AM   I spent a total of 25 minutes at the the patient's bedside AND on the patient's hospital floor or unit, greater than 50% of which was counseling/coordinating care for image guided liver lesion biopsy

## 2020-05-10 NOTE — Discharge Instructions (Signed)
Liver Biopsy, Care After These instructions give you information on caring for yourself after your procedure. Your doctor may also give you more specific instructions. Call your doctor if you have any problems or questions after your procedure. What can I expect after the procedure? After the procedure, it is common to have:  Pain and soreness where the biopsy was done.  Bruising around the area where the biopsy was done.  Sleepiness and be tired for a few days. Follow these instructions at home: Medicines  Take over-the-counter and prescription medicines only as told by your doctor.  If you were prescribed an antibiotic medicine, take it as told by your doctor. Do not stop taking the antibiotic even if you start to feel better.  Do not take medicines such as aspirin and ibuprofen. These medicines can thin your blood. Do not take these medicines unless your doctor tells you to take them.  If you are taking prescription pain medicine, take actions to prevent or treat constipation. Your doctor may recommend that you: ? Drink enough fluid to keep your pee (urine) clear or pale yellow. ? Take over-the-counter or prescription medicines. ? Eat foods that are high in fiber, such as fresh fruits and vegetables, whole grains, and beans. ? Limit foods that are high in fat and processed sugars, such as fried and sweet foods. Caring for your cut  Follow instructions from your doctor about how to take care of your cuts from surgery (incisions). Make sure you: ? Wash your hands with soap and water before you change your bandage (dressing). If you cannot use soap and water, use hand sanitizer. ? Change your bandage as told by your doctor. ? Leave stitches (sutures), skin glue, or skin tape (adhesive) strips in place. They may need to stay in place for 2 weeks or longer. If tape strips get loose and curl up, you may trim the loose edges. Do not remove tape strips completely unless your doctor says it is  okay.  Check your cuts every day for signs of infection. Check for: ? Redness, swelling, or more pain. ? Fluid or blood. ? Pus or a bad smell. ? Warmth.  Do not take baths, swim, or use a hot tub until your doctor says it is okay to do so. Activity   Rest at home for 1-2 days or as told by your doctor. ? Avoid sitting for a long time without moving. Get up to take short walks every 1-2 hours.  Return to your normal activities as told by your doctor. Ask what activities are safe for you.  Do not do these things in the first 24 hours: ? Drive. ? Use machinery. ? Take a bath or shower.  Do not lift more than 10 pounds (4.5 kg) or play contact sports for the first 2 weeks. General instructions   Do not drink alcohol in the first week after the procedure.  Have someone stay with you for at least 24 hours after the procedure.  Get your test results. Ask your doctor or the department that is doing the test: ? When will my results be ready? ? How will I get my results? ? What are my treatment options? ? What other tests do I need? ? What are my next steps?  Keep all follow-up visits as told by your doctor. This is important. Contact a doctor if:  A cut bleeds and leaves more than just a small spot of blood.  A cut is red, puffs up (  swells), or hurts more than before.  Fluid or something else comes from a cut.  A cut smells bad.  You have a fever or chills. Get help right away if:  You have swelling, bloating, or pain in your belly (abdomen).  You get dizzy or faint.  You have a rash.  You feel sick to your stomach (nauseous) or throw up (vomit).  You have trouble breathing, feel short of breath, or feel faint.  Your chest hurts.  You have problems talking or seeing.  You have trouble with your balance or moving your arms or legs. Summary  After the procedure, it is common to have pain, soreness, bruising, and tiredness.  Your doctor will tell you how to  take care of yourself at home. Change your bandage, take your medicines, and limit your activities as told by your doctor.  Call your doctor if you have symptoms of infection. Get help right away if your belly swells, your cut bleeds a lot, or you have trouble talking or breathing. This information is not intended to replace advice given to you by your health care provider. Make sure you discuss any questions you have with your health care provider. Document Revised: 12/12/2017 Document Reviewed: 12/12/2017 Elsevier Patient Education  2020 [employer] Moderate Conscious Sedation, Adult, Care After These instructions provide you with information about caring for yourself after your procedure. Your health care provider may also give you more specific instructions. Your treatment has been planned according to current medical practices, but problems sometimes occur. Call your health care provider if you have any problems or questions after your procedure. What can I expect after the procedure? After your procedure, it is common:  To feel sleepy for several hours.  To feel clumsy and have poor balance for several hours.  To have poor judgment for several hours.  To vomit if you eat too soon. Follow these instructions at home: For at least 24 hours after the procedure:   Do not: ? Participate in activities where you could fall or become injured. ? Drive. ? Use heavy machinery. ? Drink alcohol. ? Take sleeping pills or medicines that cause drowsiness. ? Make important decisions or sign legal documents. ? Take care of children on your own.  Rest. Eating and drinking  Follow the diet recommended by your health care provider.  If you vomit: ? Drink water, juice, or soup when you can drink without vomiting. ? Make sure you have little or no nausea before eating solid foods. General instructions  Have a responsible adult stay with you until you are awake and alert.  Take  over-the-counter and prescription medicines only as told by your health care provider.  If you smoke, do not smoke without supervision.  Keep all follow-up visits as told by your health care provider. This is important. Contact a health care provider if:  You keep feeling nauseous or you keep vomiting.  You feel light-headed.  You develop a rash.  You have a fever. Get help right away if:  You have trouble breathing. This information is not intended to replace advice given to you by your health care provider. Make sure you discuss any questions you have with your health care provider. Document Revised: 11/14/2017 Document Reviewed: 03/23/2016 Elsevier Patient Education  2020 [employer] Inc.  

## 2020-05-16 ENCOUNTER — Inpatient Hospital Stay (HOSPITAL_BASED_OUTPATIENT_CLINIC_OR_DEPARTMENT_OTHER): Payer: Medicare Other | Admitting: Oncology

## 2020-05-16 ENCOUNTER — Telehealth: Payer: Self-pay | Admitting: Oncology

## 2020-05-16 ENCOUNTER — Other Ambulatory Visit: Payer: Self-pay

## 2020-05-16 ENCOUNTER — Inpatient Hospital Stay: Payer: Medicare Other | Attending: Oncology

## 2020-05-16 VITALS — BP 160/84 | HR 67 | Temp 97.7°F | Resp 16 | Ht 66.0 in | Wt 167.9 lb

## 2020-05-16 DIAGNOSIS — I10 Essential (primary) hypertension: Secondary | ICD-10-CM | POA: Diagnosis not present

## 2020-05-16 DIAGNOSIS — C252 Malignant neoplasm of tail of pancreas: Secondary | ICD-10-CM

## 2020-05-16 DIAGNOSIS — I712 Thoracic aortic aneurysm, without rupture: Secondary | ICD-10-CM | POA: Insufficient documentation

## 2020-05-16 DIAGNOSIS — Z5111 Encounter for antineoplastic chemotherapy: Secondary | ICD-10-CM | POA: Insufficient documentation

## 2020-05-16 DIAGNOSIS — K769 Liver disease, unspecified: Secondary | ICD-10-CM

## 2020-05-16 DIAGNOSIS — R21 Rash and other nonspecific skin eruption: Secondary | ICD-10-CM | POA: Insufficient documentation

## 2020-05-16 DIAGNOSIS — Z7189 Other specified counseling: Secondary | ICD-10-CM | POA: Diagnosis not present

## 2020-05-16 DIAGNOSIS — K8689 Other specified diseases of pancreas: Secondary | ICD-10-CM

## 2020-05-16 DIAGNOSIS — C787 Secondary malignant neoplasm of liver and intrahepatic bile duct: Secondary | ICD-10-CM | POA: Insufficient documentation

## 2020-05-16 DIAGNOSIS — E785 Hyperlipidemia, unspecified: Secondary | ICD-10-CM | POA: Insufficient documentation

## 2020-05-16 LAB — CBC WITH DIFFERENTIAL (CANCER CENTER ONLY)
Abs Immature Granulocytes: 0.05 10*3/uL (ref 0.00–0.07)
Basophils Absolute: 0.1 10*3/uL (ref 0.0–0.1)
Basophils Relative: 1 %
Eosinophils Absolute: 0.2 10*3/uL (ref 0.0–0.5)
Eosinophils Relative: 2 %
HCT: 39.9 % (ref 39.0–52.0)
Hemoglobin: 12.9 g/dL — ABNORMAL LOW (ref 13.0–17.0)
Immature Granulocytes: 1 %
Lymphocytes Relative: 16 %
Lymphs Abs: 1.7 10*3/uL (ref 0.7–4.0)
MCH: 29.2 pg (ref 26.0–34.0)
MCHC: 32.3 g/dL (ref 30.0–36.0)
MCV: 90.3 fL (ref 80.0–100.0)
Monocytes Absolute: 1.1 10*3/uL — ABNORMAL HIGH (ref 0.1–1.0)
Monocytes Relative: 10 %
Neutro Abs: 7.3 10*3/uL (ref 1.7–7.7)
Neutrophils Relative %: 70 %
Platelet Count: 191 10*3/uL (ref 150–400)
RBC: 4.42 MIL/uL (ref 4.22–5.81)
RDW: 15.9 % — ABNORMAL HIGH (ref 11.5–15.5)
WBC Count: 10.4 10*3/uL (ref 4.0–10.5)
nRBC: 0 % (ref 0.0–0.2)

## 2020-05-16 LAB — CMP (CANCER CENTER ONLY)
ALT: 41 U/L (ref 0–44)
AST: 36 U/L (ref 15–41)
Albumin: 3.2 g/dL — ABNORMAL LOW (ref 3.5–5.0)
Alkaline Phosphatase: 166 U/L — ABNORMAL HIGH (ref 38–126)
Anion gap: 11 (ref 5–15)
BUN: 17 mg/dL (ref 8–23)
CO2: 25 mmol/L (ref 22–32)
Calcium: 9 mg/dL (ref 8.9–10.3)
Chloride: 103 mmol/L (ref 98–111)
Creatinine: 0.95 mg/dL (ref 0.61–1.24)
GFR, Est AFR Am: >60 mL/min (ref >60)
GFR, Estimated: >60 mL/min (ref >60)
Glucose, Bld: 90 mg/dL (ref 70–99)
Potassium: 4.3 mmol/L (ref 3.5–5.1)
Sodium: 139 mmol/L (ref 135–145)
Total Bilirubin: 0.9 mg/dL (ref 0.3–1.2)
Total Protein: 7.2 g/dL (ref 6.5–8.1)

## 2020-05-16 LAB — SURGICAL PATHOLOGY

## 2020-05-16 NOTE — Telephone Encounter (Signed)
Scheduled appt per 6/1 los - gave patient AVS and calender  ° °

## 2020-05-16 NOTE — Progress Notes (Signed)
[facility] OFFICE PROGRESS NOTE   Diagnosis: Pancreas cancer  INTERVAL HISTORY:   Mr. Gaulding returns as scheduled.  He generally feels well.  He reports a mild loss of energy and occasional night sweats.  He underwent an ultrasound-guided biopsy of a left liver lesion on 05/10/2020.  The pathology confirmed a poorly differentiated carcinoma.  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  Objective:  Vital signs in last 24 hours:  Blood pressure (!) 160/84, pulse 67, temperature 97.7 F (36.5 C), temperature source Temporal, resp. rate 16, height 5\' 6"  (1.676 m), weight 167 lb 14.4 oz (76.2 kg), SpO2 99 %.     Resp: Lungs clear bilaterally Cardio: Regular rate and rhythm GI: Nontender, no mass, no hepatosplenomegaly Vascular: Trace ankle edema bilaterally   Lab Results:  Lab Results  Component Value Date   WBC 10.4 05/16/2020   HGB 12.9 (L) 05/16/2020   HCT 39.9 05/16/2020   MCV 90.3 05/16/2020   PLT 191 05/16/2020   NEUTROABS 7.3 05/16/2020    CMP  Lab Results  Component Value Date   NA 139 05/16/2020   K 4.3 05/16/2020   CL 103 05/16/2020   CO2 25 05/16/2020   GLUCOSE 90 05/16/2020   BUN 17 05/16/2020   CREATININE 0.95 05/16/2020   CALCIUM 9.0 05/16/2020   PROT 7.2 05/16/2020   ALBUMIN 3.2 (L) 05/16/2020   AST 36 05/16/2020   ALT 41 05/16/2020   ALKPHOS 166 (H) 05/16/2020   BILITOT 0.9 05/16/2020   GFRNONAA >60 05/16/2020   GFRAA >60 05/16/2020    Medications: I have reviewed the patient's current medications.   Assessment/Plan: 1. Pancreas tail lesion, multiple liver lesions   Chest CT 04/12/2020-ascending thoracic aortic aneurysm; hypovascular lesion tail of the pancreas, new compared to the prior examination.  Multiple poorly defined hypovascular lesions scattered throughout the liver   CT abdomen/pelvis 05/02/2020-hypoenhancing lesion of the pancreatic tail measuring  approximately 2.4 x 1.9 cm; enlarged portacaval lymph nodes measuring up to 1.9 x 1.4 cm; numerous hypodense lesions throughout the liver, index lesion of the liver dome measuring 1.7 x 1.2 cm.  Ultrasound-guided biopsy of a left liver lesion 05/10/2020-poorly differentiated carcinoma consistent with a pancreatobiliary primary 2. Thoracic aortic aneurysm 3. Hypertension 4. Hyperlipidemia 5. History of bradycardia status post permanent pacemaker 6. Mother had cervical cancer    Disposition: Mr. Steinback underwent biopsy of a liver lesion.  The clinical presentation and biopsy result are consistent with a diagnosis of metastatic pancreas cancer.  I discussed the prognosis and treatment options with Mr. Cutrer and his son.  His daughter was present by telephone.  He understands no therapy will be curative.  We discussed an expected survival of months in the absence of treatment.  We reviewed treatment options including supportive care and systemic chemotherapy.  We discussed single agent gemcitabine and gemcitabine/Abraxane.  He has a good performance status and is in general good health.  He has minimal symptoms related to the cancer at present.  I recommend treatment with gemcitabine/Abraxane.  We reviewed potential toxicities associated with this regimen including the chance for nausea, alopecia, infection, bleeding, and hematologic toxicity.  We discussed the fever, rash, and pneumonitis seen with gemcitabine.  We discussed the allergic reaction and neuropathy associated with Abraxane.  He will be referred for chemotherapy teaching class and Port-A-Cath placement.  Mr. Gwyn will consider options over the next few days and contact us if he decides  against chemotherapy.  He will be scheduled for an office visit with the plan to begin gemcitabine/Abraxane on 05/26/2020.  We will make a referral to the genetics counselor.  I requested the liver biopsy be submitted for [facility] testing.  A  chemotherapy plan was entered into the electronic record.  Betsy Coder, MD  05/16/2020  2:42 PM

## 2020-05-16 NOTE — Progress Notes (Signed)
START OFF PATHWAY REGIMEN - Pancreatic Adenocarcinoma   [id number]:Gemcitabine 1,000 mg/m2 IV D1,8,15 + Nab-Paclitaxel 125 mg/m2 IV D1,8,15 q28 Days:   A cycle is every 28 days:     Nab-paclitaxel (protein bound)      Gemcitabine   **Always confirm dose/schedule in your pharmacy ordering system**  Patient Characteristics: Metastatic Disease, First Line, PS = 0,1, BRCA1/2 and PALB2  Mutation Absent/Unknown Therapeutic Status: Metastatic Disease Line of Therapy: First Line ECOG Performance Status: 1 BRCA1/2 Mutation Status: Awaiting Test Results PALB2 Mutation Status: Awaiting Test Results Intent of Therapy: Non-Curative / Palliative Intent, Discussed with Patient

## 2020-05-17 ENCOUNTER — Telehealth: Payer: Self-pay | Admitting: Genetic Counselor

## 2020-05-17 LAB — CANCER ANTIGEN 19-9: CA 19-9: 34270 U/mL — ABNORMAL HIGH (ref 0–35)

## 2020-05-17 NOTE — Telephone Encounter (Signed)
Spoke with [name] son, Gary [name], regarding the genetic counseling appointment that was scheduled for his father on 6/17 at 11am. They were unsure about the purpose of the appointment. We discussed that the purpose of genetic counseling is to evaluate whether there may be a hereditary cause for his father's pancreatic cancer. This would determine whether targeted treatment options may be appropriate for him, and may indicate what the cancer risks & management recommendations could be for other family members. We discussed that anyone who is diagnosed with pancreatic cancer is recommended to have genetic testing for these reasons, regardless of the family history of cancer.   Gary [name] is concerned about the added stress that an additional appointment and the genetic results might place on his father in an already stressful time. We could also consider sending the genetic testing and having the genetic counseling appointment only in the event of positive/abnormal results. I will discuss this option with Dr. Benay Spice.   Gary [name] will discuss these options with his father and will call back with their decision regarding genetic counseling/testing.

## 2020-05-18 ENCOUNTER — Ambulatory Visit (HOSPITAL_COMMUNITY): Payer: Medicare Other

## 2020-05-18 NOTE — Progress Notes (Signed)
Patient's daughter Bethena Roys had left a voice message last night.  I returned her call she is concerned that he isn't starting treatment until 6/11.  I explained that he has to have a port and he cannot get this until 6/9.  Also that a few days earlier would not have a huge effect.  I encouraged her to have him eat very well and hydrate well prior to his treatment as this regimen has side effects that will be discussed in chemo education class tomorrow 6/4.  We do have medications to onset the side effects.  She was again very tearful on the phone.  I asked her if she would like for one of our CSW to reach out to her and she agreed.  I have sent a message to them to have them offer some support.

## 2020-05-19 ENCOUNTER — Other Ambulatory Visit: Payer: Self-pay | Admitting: *Deleted

## 2020-05-19 ENCOUNTER — Other Ambulatory Visit: Payer: Self-pay

## 2020-05-19 ENCOUNTER — Inpatient Hospital Stay: Payer: Medicare Other

## 2020-05-19 ENCOUNTER — Telehealth: Payer: Self-pay | Admitting: General Practice

## 2020-05-19 MED ORDER — ONDANSETRON HCL 8 MG PO TABS: 8.0000 mg | ORAL_TABLET | Freq: Three times a day (TID) | ORAL | 1 refills | Status: DC | PRN

## 2020-05-19 MED ORDER — LIDOCAINE-PRILOCAINE 2.5-2.5 % EX CREA
1.0000 | TOPICAL_CREAM | CUTANEOUS | 2 refills | Status: DC
Start: 2020-05-19 — End: 2020-07-18

## 2020-05-19 MED ORDER — PROCHLORPERAZINE MALEATE 5 MG PO TABS: 5.0000 mg | ORAL_TABLET | Freq: Four times a day (QID) | ORAL | 1 refills | Status: DC | PRN

## 2020-05-19 NOTE — Telephone Encounter (Signed)
[facility] CSW Progress Notes  Call to daughter Bethena Roys at request of Prince Rome, nurse navigator.  Daughter states "can I call you back, this is not a good time to talk."  Encouraged her to do so and also provided desk phone so she can reach [place] next week.  Will ask CSW [place] to call her as well.  Edwyna Shell, LCSW Clinical Social Worker Phone:  [phone]

## 2020-05-19 NOTE — Progress Notes (Signed)
Sent script for anti-emetics and EMLA cream to pharmacy. Notification from [facility] that sample has been received.

## 2020-05-21 ENCOUNTER — Other Ambulatory Visit: Payer: Self-pay | Admitting: Oncology

## 2020-05-22 ENCOUNTER — Telehealth: Payer: Self-pay | Admitting: Genetic Counselor

## 2020-05-22 ENCOUNTER — Telehealth: Payer: Self-pay | Admitting: *Deleted

## 2020-05-22 NOTE — Progress Notes (Signed)
Pharmacist Chemotherapy Monitoring - Initial Assessment    Anticipated start date: 05/26/20  Regimen:  . Are orders appropriate based on the patient's diagnosis, regimen, and cycle? Yes . Does the plan date match the patient's scheduled date? Yes . Is the sequencing of drugs appropriate? Yes . Are the premedications appropriate for the patient's regimen? Yes . Prior Authorization for treatment is: Not Started o If applicable, is the correct biosimilar selected based on the patient's insurance? not applicable  Organ Function and Labs: Marland Kitchen Are dose adjustments needed based on the patient's renal function, hepatic function, or hematologic function? Yes . Are appropriate labs ordered prior to the start of patient's treatment? Yes . Other organ system assessment, if indicated: N/A . The following baseline labs, if indicated, have been ordered: N/A  Dose Assessment: . Are the drug doses appropriate? Yes . Are the following correct: o Drug concentrations Yes o IV fluid compatible with drug Yes o Administration routes Yes o Timing of therapy Yes . If applicable, does the patient have documented access for treatment and/or plans for port-a-cath placement? yes . If applicable, have lifetime cumulative doses been properly documented and assessed? not applicable Lifetime Dose Tracking  No doses have been documented on this patient for the following tracked chemicals: Doxorubicin, Epirubicin, Idarubicin, Daunorubicin, Mitoxantrone, Bleomycin, Oxaliplatin, Carboplatin, Liposomal Doxorubicin  o   Toxicity Monitoring/Prevention: . The patient has the following take home antiemetics prescribed: Prochlorperazine . The patient has the following take home medications prescribed: N/A . Medication allergies and previous infusion related reactions, if applicable, have been reviewed and addressed. Yes . The patient's current medication list has been assessed for drug-drug interactions with their chemotherapy  regimen. no significant drug-drug interactions were identified on review.  Order Review: . Are the treatment plan orders signed? Yes . Is the patient scheduled to see a provider prior to their treatment? Yes  I verify that I have reviewed each item in the above checklist and answered each question accordingly.  Philomena Course 05/22/2020 11:36 AM

## 2020-05-22 NOTE — Progress Notes (Signed)
Patient's son calls with regards to night sweats.  He has been having them consistently however last night much worse.  He wonders is this normal and can anything be done about them.  I consulted with Dr. Benay Spice and called him to let him know most likely coming from his cancer.  Per Dr. Benay Spice he recommends Aleve twice daily which should help and treatment should also help.  He verbalized an understanding.  He also states they went to get the EMLA cream from pharmacy and they need prior authorization.  I have forward that message to Almond Lint RN who handles prior authorizations.

## 2020-05-22 NOTE — Telephone Encounter (Signed)
Called to coordinate genetic testing. Patient's son is requesting additional information regarding the cost of genetic testing. We discussed the genetic testing laboratory's billing policies and the option for a pre-test benefits investigation, which we will request. Patient's son also expressed concerns about privacy of genetic test results - discussed that the results would be entered into the EMR and protected under HIPAA. We will call back to discuss and further coordinate genetic testing once the requested pre-test benefits investigation is complete.

## 2020-05-23 ENCOUNTER — Other Ambulatory Visit: Payer: Self-pay | Admitting: Radiology

## 2020-05-23 ENCOUNTER — Encounter: Payer: Self-pay | Admitting: *Deleted

## 2020-05-23 NOTE — Progress Notes (Signed)
[place] Work  Clinical Social work contacted patients daughter to follow up on emotional support and resources.  Patients daughter stated this was "not a good time", and requested a call tomorrow.    Johnnye Lana, MSW, LCSW, OSW-C Clinical Social Worker [facility] [phone]

## 2020-05-24 ENCOUNTER — Ambulatory Visit (HOSPITAL_COMMUNITY)
Admission: RE | Admit: 2020-05-24 | Discharge: 2020-05-24 | Disposition: A | Discharge status: Home / Self Care | Payer: Medicare Other | Source: Ambulatory Visit | Attending: Oncology | Admitting: Oncology

## 2020-05-24 ENCOUNTER — Encounter: Payer: Self-pay | Admitting: *Deleted

## 2020-05-24 ENCOUNTER — Encounter (HOSPITAL_COMMUNITY): Payer: Self-pay

## 2020-05-24 ENCOUNTER — Other Ambulatory Visit: Payer: Self-pay

## 2020-05-24 ENCOUNTER — Other Ambulatory Visit: Payer: Self-pay | Admitting: Oncology

## 2020-05-24 DIAGNOSIS — I10 Essential (primary) hypertension: Secondary | ICD-10-CM | POA: Insufficient documentation

## 2020-05-24 DIAGNOSIS — C252 Malignant neoplasm of tail of pancreas: Secondary | ICD-10-CM

## 2020-05-24 DIAGNOSIS — E785 Hyperlipidemia, unspecified: Secondary | ICD-10-CM | POA: Diagnosis not present

## 2020-05-24 DIAGNOSIS — I714 Abdominal aortic aneurysm, without rupture: Secondary | ICD-10-CM | POA: Diagnosis not present

## 2020-05-24 DIAGNOSIS — Z950 Presence of cardiac pacemaker: Secondary | ICD-10-CM | POA: Diagnosis not present

## 2020-05-24 DIAGNOSIS — C787 Secondary malignant neoplasm of liver and intrahepatic bile duct: Secondary | ICD-10-CM | POA: Diagnosis not present

## 2020-05-24 DIAGNOSIS — Z5111 Encounter for antineoplastic chemotherapy: Secondary | ICD-10-CM | POA: Diagnosis not present

## 2020-05-24 DIAGNOSIS — Z79899 Other long term (current) drug therapy: Secondary | ICD-10-CM | POA: Diagnosis not present

## 2020-05-24 DIAGNOSIS — Z7982 Long term (current) use of aspirin: Secondary | ICD-10-CM | POA: Diagnosis not present

## 2020-05-24 DIAGNOSIS — C259 Malignant neoplasm of pancreas, unspecified: Secondary | ICD-10-CM | POA: Diagnosis not present

## 2020-05-24 HISTORY — PX: IR IMAGING GUIDED PORT INSERTION: IMG5740

## 2020-05-24 HISTORY — DX: Malignant (primary) neoplasm, unspecified: C80.1

## 2020-05-24 MED ORDER — MIDAZOLAM HCL 2 MG/2ML IJ SOLN
INTRAMUSCULAR | Status: AC
  Filled 2020-05-24: qty 2

## 2020-05-24 MED ORDER — HEPARIN SOD (PORK) LOCK FLUSH 100 UNIT/ML IV SOLN
INTRAVENOUS | Status: AC
  Filled 2020-05-24: qty 5

## 2020-05-24 MED ORDER — CEFAZOLIN SODIUM-DEXTROSE 2-4 GM/100ML-% IV SOLN: 2.0000 g | INTRAVENOUS | Status: AC

## 2020-05-24 MED ORDER — LIDOCAINE-EPINEPHRINE 1 %-1:100000 IJ SOLN
INTRAMUSCULAR | Status: AC | PRN
  Administered 2020-05-24 (×2): 10 mL via INTRADERMAL

## 2020-05-24 MED ORDER — LIDOCAINE-EPINEPHRINE 1 %-1:100000 IJ SOLN
INTRAMUSCULAR | Status: AC
  Filled 2020-05-24: qty 1

## 2020-05-24 MED ORDER — CEFAZOLIN SODIUM-DEXTROSE 2-4 GM/100ML-% IV SOLN
INTRAVENOUS | Status: AC
  Administered 2020-05-24: 2 g via INTRAVENOUS
  Filled 2020-05-24: qty 100

## 2020-05-24 MED ORDER — MIDAZOLAM HCL 2 MG/2ML IJ SOLN
INTRAMUSCULAR | Status: AC | PRN
  Administered 2020-05-24: 0.5 mg via INTRAVENOUS
  Administered 2020-05-24: 1 mg via INTRAVENOUS

## 2020-05-24 MED ORDER — FENTANYL CITRATE (PF) 100 MCG/2ML IJ SOLN
INTRAMUSCULAR | Status: AC | PRN
  Administered 2020-05-24: 25 ug via INTRAVENOUS
  Administered 2020-05-24: 50 ug via INTRAVENOUS

## 2020-05-24 MED ORDER — FENTANYL CITRATE (PF) 100 MCG/2ML IJ SOLN
INTRAMUSCULAR | Status: AC
  Filled 2020-05-24: qty 2

## 2020-05-24 MED ORDER — HEPARIN SOD (PORK) LOCK FLUSH 100 UNIT/ML IV SOLN
INTRAVENOUS | Status: AC | PRN
  Administered 2020-05-24: 500 [IU] via INTRAVENOUS

## 2020-05-24 NOTE — Discharge Instructions (Signed)
DO NOT APPLY ANY EMLA CREAM OR LOTIONS FOR THE PORT SITE X 2WEEKS DO NOT SHOWER X 24 HRS For any questions or concerns call [phone]   Implanted Port Insertion, Care After This sheet gives you information about how to care for yourself after your procedure. Your health care provider may also give you more specific instructions. If you have problems or questions, contact your health care provider. What can I expect after the procedure? After the procedure, it is common to have:  Discomfort at the port insertion site.  Bruising on the skin over the port. This should improve over 3-4 days. Follow these instructions at home: [facility] care  After your port is placed, you will get a manufacturer's information card. The card has information about your port. Keep this card with you at all times.  Take care of the port as told by your health care provider. Ask your health care provider if you or a family member can get training for taking care of the port at home. A home health care nurse may also take care of the port.  Make sure to remember what type of port you have. Incision care      Follow instructions from your health care provider about how to take care of your port insertion site. Make sure you: ? Wash your hands with soap and water before and after you change your bandage (dressing). If soap and water are not available, use hand sanitizer. ? Change your dressing as told by your health care provider. ? Leave stitches (sutures), skin glue, or adhesive strips in place. These skin closures may need to stay in place for 2 weeks or longer. If adhesive strip edges start to loosen and curl up, you may trim the loose edges. Do not remove adhesive strips completely unless your health care provider tells you to do that.  Check your port insertion site every day for signs of infection. Check for: ? Redness, swelling, or pain. ? Fluid or blood. ? Warmth. ? Pus or a bad  smell. Activity  Return to your normal activities as told by your health care provider. Ask your health care provider what activities are safe for you.  Do not lift anything that is heavier than 10 lb (4.5 kg), or the limit that you are told, until your health care provider says that it is safe. General instructions  Take over-the-counter and prescription medicines only as told by your health care provider.  Do not take baths, swim, or use a hot tub until your health care provider approves. Ask your health care provider if you may take showers. You may only be allowed to take sponge baths.  Do not drive for 24 hours if you were given a sedative during your procedure.  Wear a medical alert bracelet in case of an emergency. This will tell any health care providers that you have a port.  Keep all follow-up visits as told by your health care provider. This is important. Contact a health care provider if:  You cannot flush your port with saline as directed, or you cannot draw blood from the port.  You have a fever or chills.  You have redness, swelling, or pain around your port insertion site.  You have fluid or blood coming from your port insertion site.  Your port insertion site feels warm to the touch.  You have pus or a bad smell coming from the port insertion site. Get help right away if:  You have  chest pain or shortness of breath.  You have bleeding from your port that you cannot control. Summary  Take care of the port as told by your health care provider. Keep the manufacturer's information card with you at all times.  Change your dressing as told by your health care provider.  Contact a health care provider if you have a fever or chills or if you have redness, swelling, or pain around your port insertion site.  Keep all follow-up visits as told by your health care provider. This information is not intended to replace advice given to you by your health care provider.  Make sure you discuss any questions you have with your health care provider. Document Revised: 06/30/2018 Document Reviewed: 06/30/2018 Elsevier Patient Education  [place]. Moderate Conscious Sedation, Adult, Care After These instructions provide you with information about caring for yourself after your procedure. Your health care provider may also give you more specific instructions. Your treatment has been planned according to current medical practices, but problems sometimes occur. Call your health care provider if you have any problems or questions after your procedure. What can I expect after the procedure? After your procedure, it is common:  To feel sleepy for several hours.  To feel clumsy and have poor balance for several hours.  To have poor judgment for several hours.  To vomit if you eat too soon. Follow these instructions at home: For at least 24 hours after the procedure:   Do not: ? Participate in activities where you could fall or become injured. ? Drive. ? Use heavy machinery. ? Drink alcohol. ? Take sleeping pills or medicines that cause drowsiness. ? Make important decisions or sign legal documents. ? Take care of children on your own.  Rest. Eating and drinking  Follow the diet recommended by your health care provider.  If you vomit: ? Drink water, juice, or soup when you can drink without vomiting. ? Make sure you have little or no nausea before eating solid foods. General instructions  Have a responsible adult stay with you until you are awake and alert.  Take over-the-counter and prescription medicines only as told by your health care provider.  If you smoke, do not smoke without supervision.  Keep all follow-up visits as told by your health care provider. This is important. Contact a health care provider if:  You keep feeling nauseous or you keep vomiting.  You feel light-headed.  You develop a rash.  You have a fever. Get help  right away if:  You have trouble breathing. This information is not intended to replace advice given to you by your health care provider. Make sure you discuss any questions you have with your health care provider. Document Revised: 11/14/2017 Document Reviewed: 03/23/2016 Elsevier Patient Education  2020 [employer].

## 2020-05-24 NOTE — Progress Notes (Signed)
[place] Work  Holiday representative contacted patients daughter by phone to offer support and assess for needs.  Patients daughter expressed feeling overwhelmed with "everything her father is going through".  CSW and patients daughter discussed common feelings and emotions when a loved one is diagnosed with cancer.  CSW provided education on the support team and support services available at [facility].  Patients daughter expressed interest in support programs.  CSW emailed patients daughter information on the support programs and services.  CSW and patients daughter are scheduled to follow up on Monday (6/14) once patients schedule has been updated, and identify a time to meet while patients is at [facility].  CSW provided contact information and encouraged patients daughter to call with questions or concerns.   Johnnye Lana, MSW, LCSW, OSW-C Clinical Social Worker [facility] [phone]

## 2020-05-24 NOTE — Procedures (Signed)
Interventional Radiology Procedure Note  Procedure: Placement of a LEFT IJ approach single lumen PowerPort.  Tip is positioned at the superior cavoatrial junction and catheter is ready for immediate use.  Complications: No immediate Recommendations:  - Ok to shower tomorrow - Do not submerge for 7 days - Routine line care   Signed,  Phu Record K. Averie Meiner, MD   

## 2020-05-24 NOTE — H&P (Signed)
Chief Complaint: Patient was seen in consultation today for placement of a port-a-cath.  Referring Physician(s): Ladell Pier  Supervising Physician: Jacqulynn Cadet  Patient Status: [facility] - Out-pt  History of Present Illness: Gary [name] is an 86 y.o. male with a past medical history that includes bradycardia, asystole, ascending aortic aneurysm and HTN. A CT angio/chest was done 04/12/2020 for his aneurysm follow-up and an incidental finding of a lesion in the tail of the pancreas was discovered. CT abdomen/pelvis 05/02/2020: 1.There is a hypoenhancing lesion of the pancreatic tail measuring approximately 2.4 x 1.9 cm. Findings are highly concerning for pancreatic adenocarcinoma. 2. Numerous hypodense lesions throughout the liver. 3. Enlarged portacaval lymph nodes. 4. Constellation of findings is consistent with nodal and hepatic metastatic disease.  A liver lesion biopsy was performed by Dr. Pascal Lux on 05/10/2020. 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   Interventional Radiology has been asked to evaluate this patient for the placement of a port-a-cath to facilitate his treatment plans.   Also, he has had right chest St. Jude pacemakers (3 total over the years) in place since 1987 but per cardiology (Dr.Croitoru, 09/06/2019) the current pacemaker's leads are nonfunctional. Cardiology will continue to monitor for an indication to replace it.  Past Medical History:  Diagnosis Date  . Ascending aortic aneurysm ([place]) 01/13/2014   stable at 4.7 cm diameter since 2009  . Asystole x2, s/p pacemaker 01/13/2014  . Bradycardia    permanent pacemaker 11/22/08 St.Jude  . Family history of adverse reaction to anesthesia    brother died after anesthesia / nose polyp removal at [facility] (40 years ago)  . HTN (hypertension) 01/13/2014  . Hyperlipidemia   . Hypertension   . Pacemaker - leads 1987, last generator 2009 St. Jude dual-chamber 01/13/2014   Atrial lead model  433-01, ventricular lead model 431-01 implanted November 1987 Right ventricular lead dysfunction with low impedance and mediocre sensing but satisfactory pacing threshold  . Pacemaker lead malfunction 01/13/2014   Chronic low impedance and poor sensing of the ventricular lead    Past Surgical History:  Procedure Laterality Date  . PERMANENT PACEMAKER GENERATOR CHANGE  11/22/08   ST. Jude  . PERMANENT PACEMAKER INSERTION  03/04/97   ST. Jude  . US ECHOCARDIOGRAPHY  05/16/11   AS mild to mod.,TR mild to mod., EF => 55%.    Allergies: Gentamycin [gentamicin sulfate], Hydrocodone, and Aspirin  Medications: Prior to Admission medications   Medication Sig Start Date End Date Taking? Authorizing Provider  amLODipine (NORVASC) 5 MG tablet Take 1 tablet (5 mg total) by mouth daily. 12/22/19  Yes Croitoru, Mihai, MD  aspirin 81 MG tablet Take 81 mg by mouth daily.   Yes [provider]  enalapril (VASOTEC) 20 MG tablet TAKE 1 TABLET(20 MG) BY MOUTH TWICE DAILY 05/09/20  Yes Croitoru, Mihai, MD  metoprolol succinate (TOPROL-XL) 50 MG 24 hr tablet TAKE 1 TABLET(50 MG) BY MOUTH DAILY 11/22/19  Yes Croitoru, Mihai, MD  omeprazole (PRILOSEC) 20 MG capsule Take 1 capsule (20 mg total) by mouth daily. 11/30/19  Yes Croitoru, Mihai, MD  simvastatin (ZOCOR) 20 MG tablet Take 1 tablet (20 mg total) by mouth at bedtime. 04/28/20  Yes Croitoru, Mihai, MD  lidocaine-prilocaine (EMLA) cream Apply 1 application topically as directed. Apply 1 hour prior to IV stick and cover with plastic wrap 05/19/20   Ladell Pier, MD  ondansetron (ZOFRAN) 8 MG tablet Take 1 tablet (8 mg total) by mouth every 8 (eight) hours  as needed for nausea or vomiting. 05/19/20   Ladell Pier, MD  prochlorperazine (COMPAZINE) 5 MG tablet Take 1-2 tablets (5-10 mg total) by mouth every 6 (six) hours as needed for nausea or vomiting. 05/19/20   Ladell Pier, MD     Family History  Problem Relation Age of Onset  . Cancer Mother     . Heart attack Father     Social History   Socioeconomic History  . Marital status: Married    Spouse name: Not on file  . Number of children: Not on file  . Years of education: Not on file  . Highest education level: Not on file  Occupational History  . Not on file  Tobacco Use  . Smoking status: Never Smoker  . Smokeless tobacco: Never Used  Substance and Sexual Activity  . Alcohol use: Yes    Comment: occas.  . Drug use: No  . Sexual activity: Not on file  Other Topics Concern  . Not on file  Social History Narrative  . Not on file   Social Determinants of Health   Financial Resource Strain:   . Difficulty of Paying Living Expenses:   Food Insecurity:   . Worried About Charity fundraiser in the Last Year:   . Arboriculturist in the Last Year:   Transportation Needs:   . Film/video editor (Medical):   Marland Kitchen Lack of Transportation (Non-Medical):   Physical Activity:   . Days of Exercise per Week:   . Minutes of Exercise per Session:   Stress:   . Feeling of Stress :   Social Connections:   . Frequency of Communication with Friends and Family:   . Frequency of Social Gatherings with Friends and Family:   . Attends Religious Services:   . Active Member of Clubs or Organizations:   . Attends Archivist Meetings:   Marland Kitchen Marital Status:     Review of Systems: A 12 point ROS discussed and pertinent positives are indicated in the HPI above.  All other systems are negative.  Review of Systems  Constitutional: Negative for activity change, appetite change, fatigue and fever.  Respiratory: Negative for cough and shortness of breath.   Cardiovascular: Positive for leg swelling. Negative for chest pain.  Gastrointestinal: Negative for abdominal distention, abdominal pain, diarrhea, nausea and vomiting.  Musculoskeletal: Negative for back pain.    Vital Signs: BP (!) 160/93 (BP Location: Right Arm)   Pulse 68   Temp 97.8 F (36.6 C) (Oral)   Resp 16    SpO2 100%   Physical Exam Constitutional:      General: He is not in acute distress.    Appearance: Normal appearance. He is normal weight.  HENT:     Mouth/Throat:     Mouth: Mucous membranes are moist.  Cardiovascular:     Rate and Rhythm: Normal rate and regular rhythm.     Pulses: Normal pulses.     Heart sounds: Normal heart sounds.     Comments: Right chest pacemaker Pulmonary:     Effort: Pulmonary effort is normal.     Breath sounds: Normal breath sounds.  Abdominal:     General: Bowel sounds are normal. There is no distension.     Palpations: Abdomen is soft.     Tenderness: There is no abdominal tenderness.  Musculoskeletal:     Right lower leg: Edema present.     Left lower leg: Edema present.  Comments: +2 pitting edema  Skin:    General: Skin is warm and dry.  Neurological:     Mental Status: He is alert and oriented to person, place, and time.  Psychiatric:        Mood and Affect: Mood normal.        Behavior: Behavior normal.        Thought Content: Thought content normal.        Judgment: Judgment normal.     Imaging: CT ABDOMEN PELVIS W WO CONTRAST  Result Date: 05/02/2020 CLINICAL DATA:  Evaluate pancreatic and liver lesions identified by prior CT chest EXAM: CT ABDOMEN AND PELVIS WITHOUT AND WITH CONTRAST TECHNIQUE: Multidetector CT imaging of the abdomen and pelvis was performed following the standard protocol before and following the bolus administration of intravenous contrast. CONTRAST:  141mL ISOVUE-300 IOPAMIDOL (ISOVUE-300) INJECTION 61% COMPARISON:  CT chest, 04/12/2020, CT lumbar spine, 03/31/2017 FINDINGS: Lower chest: No acute abnormality. Moderate hiatal hernia with intrathoracic position of the gastric fundus. Hepatobiliary: Numerous hypodense lesions throughout the liver, an index lesion of the liver dome measuring 1.7 x 1.2 cm (series 18, image 9). Rim calcified gallstone in the gallbladder. No gallbladder wall thickening, or biliary  dilatation. Pancreas: There is a hypoenhancing lesion of the pancreatic tail measuring approximately 2.4 x 1.9 cm (series 4, image 46). No pancreatic ductal dilatation or surrounding inflammatory changes. Spleen: Normal in size without significant abnormality. Adrenals/Urinary Tract: Adrenal glands are unremarkable. Kidneys are normal, without renal calculi, solid lesion, or hydronephrosis. Bladder is unremarkable. Stomach/Bowel: Stomach is within normal limits. Appendix appears normal. No evidence of bowel wall thickening, distention, or inflammatory changes. Descending and sigmoid diverticulosis. Vascular/Lymphatic: Aortic atherosclerosis. Enlarged portacaval lymph nodes measuring up to 1.9 x 1.4 cm (series 11, image 62). Reproductive: No mass or other significant abnormality. Other: No abdominal wall hernia or abnormality. No abdominopelvic ascites. Musculoskeletal: No acute or significant osseous findings. Nonacute superior endplate deformity of L2. IMPRESSION: 1. There is a hypoenhancing lesion of the pancreatic tail measuring approximately 2.4 x 1.9 cm. Findings are highly concerning for pancreatic adenocarcinoma. 2. Numerous hypodense lesions throughout the liver. 3. Enlarged portacaval lymph nodes. 4. Constellation of findings is consistent with nodal and hepatic metastatic disease. 5. Moderate hiatal hernia with intrathoracic position of the gastric fundus. 6. Cholelithiasis. 7. Diverticulosis without evidence of acute diverticulitis. 8.  Aortic Atherosclerosis (ICD10-I70.0). These results will be called to the ordering clinician or representative by the Radiologist Assistant, and communication documented in the PACS or [employer]. Electronically Signed   By: Eddie Candle M.D.   On: 05/02/2020 12:16   US BIOPSY (LIVER)  Result Date: 05/10/2020 INDICATION: Concern for metastatic pancreatic cancer. Please perform ultrasound-guided liver lesion biopsy for tissue diagnostic purposes. EXAM: ULTRASOUND  GUIDED LIVER LESION BIOPSY COMPARISON:  CT abdomen and pelvis-05/02/2020 MEDICATIONS: None ANESTHESIA/SEDATION: Fentanyl 50 mcg IV; Versed 1 mg IV Total Moderate Sedation time:  14 Minutes. The patient's level of consciousness and vital signs were monitored continuously by radiology nursing throughout the procedure under my direct supervision. COMPLICATIONS: None immediate. PROCEDURE: Informed written consent was obtained from the patient after a discussion of the risks, benefits and alternatives to treatment. The patient understands and consents the procedure. A timeout was performed prior to the initiation of the procedure. Ultrasound scanning was performed of the right upper abdominal quadrant demonstrates several ill-defined hypo/nearly isoechoic lesions scattered within both the right and left lobes of liver. A dominant ill-defined slightly hypoechoic lesion within the lateral segment  of the left lobe of the liver correlating with the lesion seen on preceding abdominal CT image 51, series 11, was targeted for biopsy given location, visualization and sonographic window. The procedure was planned. The midline of the abdomen was prepped and draped in the usual sterile fashion. The overlying soft tissues were anesthetized with 1% lidocaine with epinephrine. A 17 gauge, 6.8 cm co-axial needle was advanced into a peripheral aspect of the lesion. This was followed by 5 core biopsies with an 18 gauge core device under direct ultrasound guidance. The coaxial needle tract was embolized with a small amount of Gel-Foam slurry and superficial hemostasis was obtained with manual compression. Post procedural scanning was negative for definitive area of hemorrhage or additional complication. A dressing was placed. The patient tolerated the procedure well without immediate post procedural complication. IMPRESSION: Technically successful ultrasound guided core needle biopsy of ill-defined lesion within the lateral segment of the  left lobe of the liver. Electronically Signed   By: Sandi Mariscal M.D.   On: 05/10/2020 15:20    Labs:  CBC: Recent Labs    05/10/20 1156 05/16/20 1349  WBC 9.9 10.4  HGB 13.1 12.9*  HCT 40.4 39.9  PLT 163 191    COAGS: Recent Labs    05/10/20 1156  INR 1.1  APTT 29    BMP: Recent Labs    03/23/20 1428 05/10/20 1156 05/16/20 1349  NA 142 140 139  K 5.1 4.2 4.3  CL 104 104 103  CO2 25 25 25   GLUCOSE 74 97 90  BUN 18 17 17   CALCIUM 9.1 8.8* 9.0  CREATININE 0.90 0.91 0.95  GFRNONAA 78 >60 >60  GFRAA 90 >60 >60    LIVER FUNCTION TESTS: Recent Labs    05/10/20 1156 05/16/20 1349  BILITOT 1.1 0.9  AST 37 36  ALT 41 41  ALKPHOS 138* 166*  PROT 7.1 7.2  ALBUMIN 3.5 3.2*    TUMOR MARKERS: No results for input(s): AFPTM, CEA, CA199, CHROMGRNA in the last 8760 hours.  Assessment and Plan:  Pancreatic Adenocarcinoma: Lanice Shirts, 86 year old male, presents today to the [place] [facility] department for the placement of a port-a-cath.   Risks and benefits of an image-guided Port-a-catheter placement were discussed with the patient including, but not limited to bleeding, infection, pneumothorax, or fibrin sheath development and need for additional procedures.  All of the patient's questions were answered, patient is agreeable to proceed. Consent signed and in chart.  The patient has been NPO. He will receive pre-procedure prophylactic IV cefazolin.  Thank you for this interesting consult.  I greatly enjoyed meeting Gary [name] and look forward to participating in their care.  A copy of this report was sent to the requesting provider on this date.  Electronically Signed: Theresa Duty, NP 05/24/2020, 10:41 AM   I spent a total of  30 Minutes   in face to face in clinical consultation, greater than 50% of which was counseling/coordinating care for placement of a port-a-cath.

## 2020-05-26 ENCOUNTER — Telehealth: Payer: Self-pay | Admitting: Oncology

## 2020-05-26 ENCOUNTER — Inpatient Hospital Stay (HOSPITAL_BASED_OUTPATIENT_CLINIC_OR_DEPARTMENT_OTHER): Payer: Medicare Other | Admitting: Oncology

## 2020-05-26 ENCOUNTER — Other Ambulatory Visit: Payer: Medicare Other

## 2020-05-26 ENCOUNTER — Other Ambulatory Visit: Payer: Self-pay

## 2020-05-26 ENCOUNTER — Inpatient Hospital Stay: Payer: Medicare Other

## 2020-05-26 ENCOUNTER — Encounter: Payer: Self-pay | Admitting: Oncology

## 2020-05-26 VITALS — BP 140/60 | HR 75 | Temp 97.9°F | Resp 20 | Ht 66.0 in | Wt 170.1 lb

## 2020-05-26 DIAGNOSIS — C252 Malignant neoplasm of tail of pancreas: Secondary | ICD-10-CM

## 2020-05-26 DIAGNOSIS — Z5111 Encounter for antineoplastic chemotherapy: Secondary | ICD-10-CM | POA: Diagnosis not present

## 2020-05-26 MED ORDER — PROCHLORPERAZINE MALEATE 10 MG PO TABS
ORAL_TABLET | ORAL | Status: AC
  Filled 2020-05-26: qty 1

## 2020-05-26 MED ORDER — SODIUM CHLORIDE 0.9 % IV SOLN
1000.0000 mg/m2 | Freq: Once | INTRAVENOUS | Status: AC
  Administered 2020-05-26: 1862 mg via INTRAVENOUS
  Filled 2020-05-26: qty 48.97

## 2020-05-26 MED ORDER — PROCHLORPERAZINE MALEATE 10 MG PO TABS
10.0000 mg | ORAL_TABLET | Freq: Once | ORAL | Status: AC
  Administered 2020-05-26: 10 mg via ORAL

## 2020-05-26 MED ORDER — SODIUM CHLORIDE 0.9% FLUSH
10.0000 mL | INTRAVENOUS | Status: DC | PRN
  Administered 2020-05-26: 10 mL
  Filled 2020-05-26: qty 10

## 2020-05-26 MED ORDER — HEPARIN SOD (PORK) LOCK FLUSH 100 UNIT/ML IV SOLN
500.0000 [IU] | Freq: Once | INTRAVENOUS | Status: AC | PRN
  Administered 2020-05-26: 500 [IU]
  Filled 2020-05-26: qty 5

## 2020-05-26 MED ORDER — PACLITAXEL PROTEIN-BOUND CHEMO INJECTION 100 MG
100.0000 mg/m2 | Freq: Once | INTRAVENOUS | Status: AC
  Administered 2020-05-26: 200 mg via INTRAVENOUS
  Filled 2020-05-26: qty 40

## 2020-05-26 MED ORDER — SODIUM CHLORIDE 0.9 % IV SOLN
Freq: Once | INTRAVENOUS | Status: AC
  Filled 2020-05-26: qty 250

## 2020-05-26 NOTE — Progress Notes (Signed)
Met with patient at registration to introduce myself as Financial Resource Specialist and to offer available resources.  Discussed one-time $1000 [employer] grant and qualifications to assist with personal expenses while going through treatment.  Gave him my card if interested in applying and for any additional financial questions or concerns.  

## 2020-05-26 NOTE — Telephone Encounter (Signed)
Scheduled appt per 6/11 los.  Printed calendar and avs. 

## 2020-05-26 NOTE — Patient Instructions (Addendum)
[facility] Discharge Instructions for Patients Receiving Chemotherapy  Today you received the following chemotherapy agents: Abraxane, Gemzar  To help prevent nausea and vomiting after your treatment, we encourage you to take your nausea medication as directed.   If you develop nausea and vomiting that is not controlled by your nausea medication, call the clinic.   BELOW ARE SYMPTOMS THAT SHOULD BE REPORTED IMMEDIATELY:  *FEVER GREATER THAN 100.5 F  *CHILLS WITH OR WITHOUT FEVER  NAUSEA AND VOMITING THAT IS NOT CONTROLLED WITH YOUR NAUSEA MEDICATION  *UNUSUAL SHORTNESS OF BREATH  *UNUSUAL BRUISING OR BLEEDING  TENDERNESS IN MOUTH AND THROAT WITH OR WITHOUT PRESENCE OF ULCERS  *URINARY PROBLEMS  *BOWEL PROBLEMS  UNUSUAL RASH Items with * indicate a potential emergency and should be followed up as soon as possible.  Feel free to call the clinic should you have any questions or concerns. The clinic phone number is [phone].  Please show the CHEMO ALERT CARD at check-in to the Emergency Department and triage nurse.  Nanoparticle Albumin-Bound Paclitaxel injection What is this medicine? NANOPARTICLE ALBUMIN-BOUND PACLITAXEL (Na no PAHR ti kuhl al BYOO muhn-bound PAK li TAX el) is a chemotherapy drug. It targets fast dividing cells, like cancer cells, and causes these cells to die. This medicine is used to treat advanced breast cancer, lung cancer, and pancreatic cancer. This medicine may be used for other purposes; ask your health care provider or pharmacist if you have questions. COMMON BRAND NAME(S): Abraxane What should I tell my health care provider before I take this medicine? They need to know if you have any of these conditions:  kidney disease  liver disease  low blood counts, like low white cell, platelet, or red cell counts  lung or breathing disease, like asthma  tingling of the fingers or toes, or other nerve disorder  an unusual or  allergic reaction to paclitaxel, albumin, other chemotherapy, other medicines, foods, dyes, or preservatives  pregnant or trying to get pregnant  breast-feeding How should I use this medicine? This drug is given as an infusion into a vein. It is administered in a hospital or clinic by a specially trained health care professional. Talk to your pediatrician regarding the use of this medicine in children. Special care may be needed. Overdosage: If you think you have taken too much of this medicine contact a poison control center or emergency room at once. NOTE: This medicine is only for you. Do not share this medicine with others. What if I miss a dose? It is important not to miss your dose. Call your doctor or health care professional if you are unable to keep an appointment. What may interact with this medicine? This medicine may interact with the following medications:  antiviral medicines for hepatitis, HIV or AIDS  certain antibiotics like erythromycin and clarithromycin  certain medicines for fungal infections like ketoconazole and itraconazole  certain medicines for seizures like carbamazepine, phenobarbital, phenytoin  gemfibrozil  nefazodone  rifampin  St. John's wort This list may not describe all possible interactions. Give your health care provider a list of all the medicines, herbs, non-prescription drugs, or dietary supplements you use. Also tell them if you smoke, drink alcohol, or use illegal drugs. Some items may interact with your medicine. What should I watch for while using this medicine? Your condition will be monitored carefully while you are receiving this medicine. You will need important blood work done while you are taking this medicine. This medicine can cause serious   serious allergic reactions. If you experience allergic reactions like skin rash, itching or hives, swelling of the face, lips, or tongue, tell your doctor or health care professional right away. In some  cases, you may be given additional medicines to help with side effects. Follow all directions for their use. This drug may make you feel generally unwell. This is not uncommon, as chemotherapy can affect healthy cells as well as cancer cells. Report any side effects. Continue your course of treatment even though you feel ill unless your doctor tells you to stop. Call your doctor or health care professional for advice if you get a fever, chills or sore throat, or other symptoms of a cold or flu. Do not treat yourself. This drug decreases your body's ability to fight infections. Try to avoid being around people who are sick. This medicine may increase your risk to bruise or bleed. Call your doctor or health care professional if you notice any unusual bleeding. Be careful brushing and flossing your teeth or using a toothpick because you may get an infection or bleed more easily. If you have any dental work done, tell your dentist you are receiving this medicine. Avoid taking products that contain aspirin, acetaminophen, ibuprofen, naproxen, or ketoprofen unless instructed by your doctor. These medicines may hide a fever. Do not become pregnant while taking this medicine or for 6 months after stopping it. Women should inform their doctor if they wish to become pregnant or think they might be pregnant. Men should not father a child while taking this medicine or for 3 months after stopping it. There is a potential for serious side effects to an unborn child. Talk to your health care professional or pharmacist for more information. Do not breast-feed an infant while taking this medicine or for 2 weeks after stopping it. This medicine may interfere with the ability to get pregnant or to father a child. You should talk to your doctor or health care professional if you are concerned about your fertility. What side effects may I notice from receiving this medicine? Side effects that you should report to your doctor  or health care professional as soon as possible:  allergic reactions like skin rash, itching or hives, swelling of the face, lips, or tongue  breathing problems  changes in vision  fast, irregular heartbeat  low blood pressure  mouth sores  pain, tingling, numbness in the hands or feet  signs of decreased platelets or bleeding - bruising, pinpoint red spots on the skin, black, tarry stools, blood in the urine  signs of decreased red blood cells - unusually weak or tired, feeling faint or lightheaded, falls  signs of infection - fever or chills, cough, sore throat, pain or difficulty passing urine  signs and symptoms of liver injury like dark yellow or brown urine; general ill feeling or flu-like symptoms; light-colored stools; loss of appetite; nausea; right upper belly pain; unusually weak or tired; yellowing of the eyes or skin  swelling of the ankles, feet, hands  unusually slow heartbeat Side effects that usually do not require medical attention (report to your doctor or health care professional if they continue or are bothersome):  diarrhea  hair loss  loss of appetite  nausea, vomiting  tiredness This list may not describe all possible side effects. Call your doctor for medical advice about side effects. You may report side effects to FDA at 1-800-FDA-1088. Where should I keep my medicine? This drug is given in a hospital or clinic and  not be stored at home. NOTE: This sheet is a summary. It may not cover all possible information. If you have questions about this medicine, talk to your doctor, pharmacist, or health care provider.  2020 Elsevier/Gold Standard (2017-08-05 13:03:45)  Gemcitabine injection What is this medicine? GEMCITABINE (jem SYE ta been) is a chemotherapy drug. This medicine is used to treat many types of cancer like breast cancer, lung cancer, pancreatic cancer, and ovarian cancer. This medicine may be used for other purposes; ask your  health care provider or pharmacist if you have questions. COMMON BRAND NAME(S): Gemzar, Infugem What should I tell my health care provider before I take this medicine? They need to know if you have any of these conditions:  blood disorders  infection  kidney disease  liver disease  lung or breathing disease, like asthma  recent or ongoing radiation therapy  an unusual or allergic reaction to gemcitabine, other chemotherapy, other medicines, foods, dyes, or preservatives  pregnant or trying to get pregnant  breast-feeding How should I use this medicine? This drug is given as an infusion into a vein. It is administered in a hospital or clinic by a specially trained health care professional. Talk to your pediatrician regarding the use of this medicine in children. Special care may be needed. Overdosage: If you think you have taken too much of this medicine contact a poison control center or emergency room at once. NOTE: This medicine is only for you. Do not share this medicine with others. What if I miss a dose? It is important not to miss your dose. Call your doctor or health care professional if you are unable to keep an appointment. What may interact with this medicine?  medicines to increase blood counts like filgrastim, pegfilgrastim, sargramostim  some other chemotherapy drugs like cisplatin  vaccines Talk to your doctor or health care professional before taking any of these medicines:  acetaminophen  aspirin  ibuprofen  ketoprofen  naproxen This list may not describe all possible interactions. Give your health care provider a list of all the medicines, herbs, non-prescription drugs, or dietary supplements you use. Also tell them if you smoke, drink alcohol, or use illegal drugs. Some items may interact with your medicine. What should I watch for while using this medicine? Visit your doctor for checks on your progress. This drug may make you feel generally unwell.  This is not uncommon, as chemotherapy can affect healthy cells as well as cancer cells. Report any side effects. Continue your course of treatment even though you feel ill unless your doctor tells you to stop. In some cases, you may be given additional medicines to help with side effects. Follow all directions for their use. Call your doctor or health care professional for advice if you get a fever, chills or sore throat, or other symptoms of a cold or flu. Do not treat yourself. This drug decreases your body's ability to fight infections. Try to avoid being around people who are sick. This medicine may increase your risk to bruise or bleed. Call your doctor or health care professional if you notice any unusual bleeding. Be careful brushing and flossing your teeth or using a toothpick because you may get an infection or bleed more easily. If you have any dental work done, tell your dentist you are receiving this medicine. Avoid taking products that contain aspirin, acetaminophen, ibuprofen, naproxen, or ketoprofen unless instructed by your doctor. These medicines may hide a fever. Do not become pregnant while taking this   medicine or for 6 months after stopping it. Women should inform their doctor if they wish to become pregnant or think they might be pregnant. Men should not father a child while taking this medicine and for 3 months after stopping it. There is a potential for serious side effects to an unborn child. Talk to your health care professional or pharmacist for more information. Do not breast-feed an infant while taking this medicine or for at least 1 week after stopping it. Men should inform their doctors if they wish to father a child. This medicine may lower sperm counts. Talk with your doctor or health care professional if you are concerned about your fertility. What side effects may I notice from receiving this medicine? Side effects that you should report to your doctor or health care  professional as soon as possible:  allergic reactions like skin rash, itching or hives, swelling of the face, lips, or tongue  breathing problems  pain, redness, or irritation at site where injected  signs and symptoms of a dangerous change in heartbeat or heart rhythm like chest pain; dizziness; fast or irregular heartbeat; palpitations; feeling faint or lightheaded, falls; breathing problems  signs of decreased platelets or bleeding - bruising, pinpoint red spots on the skin, black, tarry stools, blood in the urine  signs of decreased red blood cells - unusually weak or tired, feeling faint or lightheaded, falls  signs of infection - fever or chills, cough, sore throat, pain or difficulty passing urine  signs and symptoms of kidney injury like trouble passing urine or change in the amount of urine  signs and symptoms of liver injury like dark yellow or brown urine; general ill feeling or flu-like symptoms; light-colored stools; loss of appetite; nausea; right upper belly pain; unusually weak or tired; yellowing of the eyes or skin  swelling of ankles, feet, hands Side effects that usually do not require medical attention (report to your doctor or health care professional if they continue or are bothersome):  constipation  diarrhea  hair loss  loss of appetite  nausea  rash  vomiting This list may not describe all possible side effects. Call your doctor for medical advice about side effects. You may report side effects to FDA at 1-800-FDA-1088. Where should I keep my medicine? This drug is given in a hospital or clinic and will not be stored at home. NOTE: This sheet is a summary. It may not cover all possible information. If you have questions about this medicine, talk to your doctor, pharmacist, or health care provider.  2020 Elsevier/Gold Standard (2018-02-25 18:06:11)  

## 2020-05-26 NOTE — Progress Notes (Signed)
[facility] OFFICE PROGRESS NOTE   Diagnosis: Pancreas cancer  INTERVAL HISTORY:   Gary [name] returns as scheduled.  He underwent Port-A-Cath placement on 05/24/2020.  He has tenderness at the Port-A-Cath site.  Night sweats improved when he started naproxen prophylaxis.  Good appetite.  No other complaint.  Objective:  Vital signs in last 24 hours:  Blood pressure 140/60, pulse 75, temperature 97.9 F (36.6 C), resp. rate 20, height 5\' 6"  (1.676 m), weight 170 lb 1.6 oz (77.2 kg), SpO2 100 %.    Resp: Lungs clear bilaterally Cardio: Regular rate and rhythm GI: No hepatosplenomegaly, nontender Vascular: No leg edema   Portacath/PICC-mild erythema in a tape distribution surrounding the Port-A-Cath  Lab Results:  Lab Results  Component Value Date   WBC 10.4 05/16/2020   HGB 12.9 (L) 05/16/2020   HCT 39.9 05/16/2020   MCV 90.3 05/16/2020   PLT 191 05/16/2020   NEUTROABS 7.3 05/16/2020    CMP  Lab Results  Component Value Date   NA 139 05/16/2020   K 4.3 05/16/2020   CL 103 05/16/2020   CO2 25 05/16/2020   GLUCOSE 90 05/16/2020   BUN 17 05/16/2020   CREATININE 0.95 05/16/2020   CALCIUM 9.0 05/16/2020   PROT 7.2 05/16/2020   ALBUMIN 3.2 (L) 05/16/2020   AST 36 05/16/2020   ALT 41 05/16/2020   ALKPHOS 166 (H) 05/16/2020   BILITOT 0.9 05/16/2020   GFRNONAA >60 05/16/2020   GFRAA >60 05/16/2020     Imaging:  IR IMAGING GUIDED PORT INSERTION  Result Date: 05/24/2020 INDICATION: 86 year old male with metastatic pancreatic cancer in need of durable venous access for chemotherapy. EXAM: IMPLANTED PORT A CATH PLACEMENT WITH ULTRASOUND AND FLUOROSCOPIC GUIDANCE MEDICATIONS: 2 g Ancef; The antibiotic was administered within an appropriate time interval prior to skin puncture. ANESTHESIA/SEDATION: Versed 1.5 mg IV; Fentanyl 75 mcg IV; Moderate Sedation Time:  21 minutes The patient was continuously monitored during the procedure by the interventional  radiology nurse under my direct supervision. FLUOROSCOPY TIME:  0 minutes, 42 seconds (11 mGy) COMPLICATIONS: None immediate. PROCEDURE: The left neck and chest was prepped with chlorhexidine, and draped in the usual sterile fashion using maximum barrier technique (cap and mask, sterile gown, sterile gloves, large sterile sheet, hand hygiene and cutaneous antiseptic). Local anesthesia was attained by infiltration with 1% lidocaine with epinephrine. Ultrasound demonstrated patency of the left internal jugular vein vein, and this was documented with an image. Under real-time ultrasound guidance, this vein was accessed with a 21 gauge micropuncture needle and image documentation was performed. A small dermatotomy was made at the access site with an 11 scalpel. A 0.018" wire was advanced into the SVC and the access needle exchanged for a 51F micropuncture vascular sheath. The 0.018" wire was then removed and a 0.035" wire advanced into the IVC. An appropriate location for the subcutaneous reservoir was selected below the clavicle and an incision was made through the skin and underlying soft tissues. The subcutaneous tissues were then dissected using a combination of blunt and sharp surgical technique and a pocket was formed. A single lumen power injectable portacatheter was then tunneled through the subcutaneous tissues from the pocket to the dermatotomy and the port reservoir placed within the subcutaneous pocket. The venous access site was then serially dilated and a peel away vascular sheath placed over the wire. The wire was removed and the port catheter advanced into position under fluoroscopic guidance. The catheter tip is positioned in the upper  right atrium. This was documented with a spot image. The portacatheter was then tested and found to flush and aspirate well. The port was flushed with saline followed by 100 units/mL heparinized saline. The pocket was then closed in two layers using first subdermal  inverted interrupted absorbable sutures followed by a running subcuticular suture. The epidermis was then sealed with Dermabond. The dermatotomy at the venous access site was also closed with Dermabond. IMPRESSION: Successful placement of a left IJ approach Power Port with ultrasound and fluoroscopic guidance. The catheter is ready for use. Electronically Signed   By: Jacqulynn Cadet M.D.   On: 05/24/2020 15:09    Medications: I have reviewed the patient's current medications.   1. Pancreas tail lesion, multiple liver lesions   Chest CT 04/12/2020-ascending thoracic aortic aneurysm; hypovascular lesion tail of the pancreas, new compared to the prior examination.  Multiple poorly defined hypovascular lesions scattered throughout the liver   CT abdomen/pelvis 05/02/2020-hypoenhancing lesion of the pancreatic tail measuring approximately 2.4 x 1.9 cm; enlarged portacaval lymph nodes measuring up to 1.9 x 1.4 cm; numerous hypodense lesions throughout the liver, index lesion of the liver dome measuring 1.7 x 1.2 cm.  Ultrasound-guided biopsy of a left liver lesion 05/10/2020-poorly differentiated carcinoma consistent with a pancreatobiliary primary  Markedly elevated CA 19-9   Cycle 1 gemcitabine/Abraxane 05/26/2020 2. Thoracic aortic aneurysm 3. Hypertension 4. Hyperlipidemia 5. History of bradycardia status post permanent pacemaker 6. Mother had cervical cancer  Disposition: Gary [name] appears stable.  He decided to proceed with gemcitabine/Abraxane.  He has attended to chemotherapy teaching class.  The plan is to begin treatment today.  He will return for an office visit and chemotherapy in 2 weeks.  Gary [name] will have a lab draw for genetic testing when he returns in 2 weeks.  Betsy Coder, MD  05/26/2020  3:05 PM

## 2020-05-28 ENCOUNTER — Encounter (HOSPITAL_COMMUNITY): Payer: Self-pay | Admitting: Emergency Medicine

## 2020-05-28 ENCOUNTER — Emergency Department (HOSPITAL_COMMUNITY): Payer: Medicare Other

## 2020-05-28 ENCOUNTER — Emergency Department (HOSPITAL_COMMUNITY)
Admission: EM | Admit: 2020-05-28 | Discharge: 2020-05-28 | Disposition: A | Discharge status: Home / Self Care | Payer: Medicare Other | Attending: Emergency Medicine | Admitting: Emergency Medicine

## 2020-05-28 ENCOUNTER — Other Ambulatory Visit: Payer: Self-pay

## 2020-05-28 DIAGNOSIS — R509 Fever, unspecified: Secondary | ICD-10-CM

## 2020-05-28 DIAGNOSIS — Z950 Presence of cardiac pacemaker: Secondary | ICD-10-CM | POA: Diagnosis not present

## 2020-05-28 DIAGNOSIS — I10 Essential (primary) hypertension: Secondary | ICD-10-CM | POA: Diagnosis not present

## 2020-05-28 DIAGNOSIS — Z7982 Long term (current) use of aspirin: Secondary | ICD-10-CM | POA: Insufficient documentation

## 2020-05-28 DIAGNOSIS — Z79899 Other long term (current) drug therapy: Secondary | ICD-10-CM | POA: Insufficient documentation

## 2020-05-28 DIAGNOSIS — R5383 Other fatigue: Secondary | ICD-10-CM | POA: Diagnosis not present

## 2020-05-28 LAB — COMPREHENSIVE METABOLIC PANEL
ALT: 67 U/L — ABNORMAL HIGH (ref 0–44)
AST: 107 U/L — ABNORMAL HIGH (ref 15–41)
Albumin: 3.5 g/dL (ref 3.5–5.0)
Alkaline Phosphatase: 136 U/L — ABNORMAL HIGH (ref 38–126)
Anion gap: 14 (ref 5–15)
BUN: 24 mg/dL — ABNORMAL HIGH (ref 8–23)
CO2: 20 mmol/L — ABNORMAL LOW (ref 22–32)
Calcium: 8.6 mg/dL — ABNORMAL LOW (ref 8.9–10.3)
Chloride: 104 mmol/L (ref 98–111)
Creatinine, Ser: 0.84 mg/dL (ref 0.61–1.24)
GFR calc Af Amer: >60 mL/min (ref >60)
GFR calc non Af Amer: >60 mL/min (ref >60)
Glucose, Bld: 112 mg/dL — ABNORMAL HIGH (ref 70–99)
Potassium: 4.3 mmol/L (ref 3.5–5.1)
Sodium: 138 mmol/L (ref 135–145)
Total Bilirubin: 2.3 mg/dL — ABNORMAL HIGH (ref 0.3–1.2)
Total Protein: 7.3 g/dL (ref 6.5–8.1)

## 2020-05-28 LAB — URINALYSIS, ROUTINE W REFLEX MICROSCOPIC
Bacteria, UA: NONE SEEN
Bilirubin Urine: NEGATIVE
Glucose, UA: NEGATIVE mg/dL
Ketones, ur: NEGATIVE mg/dL
Leukocytes,Ua: NEGATIVE
Nitrite: NEGATIVE
Protein, ur: NEGATIVE mg/dL
Specific Gravity, Urine: 1.024 (ref 1.005–1.030)
pH: 5 (ref 5.0–8.0)

## 2020-05-28 LAB — CBC WITH DIFFERENTIAL/PLATELET
Abs Immature Granulocytes: 0.12 10*3/uL — ABNORMAL HIGH (ref 0.00–0.07)
Basophils Absolute: 0 10*3/uL (ref 0.0–0.1)
Basophils Relative: 0 %
Eosinophils Absolute: 0 10*3/uL (ref 0.0–0.5)
Eosinophils Relative: 0 %
HCT: 39.7 % (ref 39.0–52.0)
Hemoglobin: 13.1 g/dL (ref 13.0–17.0)
Immature Granulocytes: 1 %
Lymphocytes Relative: 4 %
Lymphs Abs: 0.6 10*3/uL — ABNORMAL LOW (ref 0.7–4.0)
MCH: 29.1 pg (ref 26.0–34.0)
MCHC: 33 g/dL (ref 30.0–36.0)
MCV: 88.2 fL (ref 80.0–100.0)
Monocytes Absolute: 0.2 10*3/uL (ref 0.1–1.0)
Monocytes Relative: 1 %
Neutro Abs: 14.3 10*3/uL — ABNORMAL HIGH (ref 1.7–7.7)
Neutrophils Relative %: 94 %
Platelets: 125 10*3/uL — ABNORMAL LOW (ref 150–400)
RBC: 4.5 MIL/uL (ref 4.22–5.81)
RDW: 16.1 % — ABNORMAL HIGH (ref 11.5–15.5)
WBC: 15.2 10*3/uL — ABNORMAL HIGH (ref 4.0–10.5)
nRBC: 0 % (ref 0.0–0.2)

## 2020-05-28 LAB — LACTIC ACID, PLASMA
Lactic Acid, Venous: 1.3 mmol/L (ref 0.5–1.9)
Lactic Acid, Venous: 1.2 mmol/L (ref 0.5–1.9)

## 2020-05-28 LAB — LIPASE, BLOOD: Lipase: 20 U/L (ref 11–51)

## 2020-05-28 MED ORDER — AMOXICILLIN-POT CLAVULANATE 875-125 MG PO TABS
1.0000 | ORAL_TABLET | Freq: Once | ORAL | Status: AC
  Administered 2020-05-28: 1 via ORAL
  Filled 2020-05-28: qty 1

## 2020-05-28 MED ORDER — AMOXICILLIN-POT CLAVULANATE 875-125 MG PO TABS: 1.0000 | ORAL_TABLET | Freq: Two times a day (BID) | ORAL | 0 refills | Status: AC

## 2020-05-28 MED ORDER — SODIUM CHLORIDE 0.9% FLUSH
3.0000 mL | Freq: Once | INTRAVENOUS | Status: AC
  Administered 2020-05-28: 3 mL via INTRAVENOUS

## 2020-05-28 NOTE — ED Notes (Signed)
Pt ambulated to the restroom without assistance

## 2020-05-28 NOTE — ED Provider Notes (Signed)
[place] DEPT Provider Note   CSN: [id number] Arrival date & time: 05/28/20  1702     History Chief Complaint  Patient presents with  . Emesis  . Fatigue  . Chills    Gary [name] is a 86 y.o. male history of pancreatic cancer who began chemotherapy with his first round of infusion 3 days ago presented to emergency department the fever.  The patient had a Port-A-Cath inserted about a week ago.  His family members and the patient reports that this evening around 5 PM the patient had an episode of vomiting, complaining of nausea prior to that.  His nausea was completely relieved after vomiting.  Family took his temperature and he spiked with an oral temp of 101.41F.  They spoke to their doctor who told him to come to the emergency department.   Here in the emergency department the patient has no complaints.  He denies any abdominal pain or nausea.  He denies any chest pain, shortness of breath, sore throat, headache.  He denies any dysuria but has chronic urine incontinence.  He says the last time he had a urine infection was probably 15 years ago.  He has never had abdominal surgery.  His son says they gave him compazine after the vomiting but did NOT give tylenol, motrin or any anti-pyrretics at home prior to coming to the ER.  He did receive both covid vaccines.    HPI     Past Medical History:  Diagnosis Date  . Ascending aortic aneurysm ([place]) 01/13/2014   stable at 4.7 cm diameter since 2009  . Asystole x2, s/p pacemaker 01/13/2014  . Bradycardia    permanent pacemaker 11/22/08 [employer].Jude  . Cancer ([place])   . Family history of adverse reaction to anesthesia    brother died after anesthesia / nose polyp removal at [facility] (40 years ago)  . HTN (hypertension) 01/13/2014  . Hyperlipidemia   . Hypertension   . Pacemaker - leads 1987, last generator 2009 St. Jude dual-chamber 01/13/2014   Atrial lead model 433-01, ventricular lead model 431-01 implanted  November 1987 Right ventricular lead dysfunction with low impedance and mediocre sensing but satisfactory pacing threshold  . Pacemaker lead malfunction 01/13/2014   Chronic low impedance and poor sensing of the ventricular lead    Patient Active Problem List   Diagnosis Date Noted  . Goals of care, counseling/discussion 05/16/2020  . Cancer of pancreas, tail ([place]) 05/16/2020  . Coronary atherosclerosis 07/13/2014  . Ascending aortic aneurysm ([place]) 01/13/2014  . Asystole x2, s/p pacemaker 01/13/2014  . Pacemaker - leads 1987, last generator 2009 St. Jude dual-chamber 01/13/2014  . Pacemaker lead malfunction 01/13/2014  . HTN (hypertension) 01/13/2014  . Hyperlipidemia 01/13/2014    Past Surgical History:  Procedure Laterality Date  . IR IMAGING GUIDED PORT INSERTION  05/24/2020  . PERMANENT PACEMAKER GENERATOR CHANGE  11/22/08   ST. Jude  . PERMANENT PACEMAKER INSERTION  03/04/97   ST. Jude  . US ECHOCARDIOGRAPHY  05/16/11   AS mild to mod.,TR mild to mod., EF => 55%.       Family History  Problem Relation Age of Onset  . Cancer Mother   . Heart attack Father     Social History   Tobacco Use  . Smoking status: Never Smoker  . Smokeless tobacco: Never Used  Vaping Use  . Vaping Use: Never used  Substance Use Topics  . Alcohol use: Yes    Comment: occas.  [place] Kitchen  Drug use: No    Home Medications Prior to Admission medications   Medication Sig Start Date End Date Taking? Authorizing Provider  amLODipine (NORVASC) 5 MG tablet Take 1 tablet (5 mg total) by mouth daily. 12/22/19   Croitoru, Mihai, MD  amoxicillin-clavulanate (AUGMENTIN) 875-125 MG tablet Take 1 tablet by mouth every 12 (twelve) hours for 7 days. 05/29/20 06/05/20  Wyvonnia Dusky, MD  aspirin 81 MG tablet Take 81 mg by mouth daily.    [provider]  enalapril (VASOTEC) 20 MG tablet TAKE 1 TABLET(20 MG) BY MOUTH TWICE DAILY 05/09/20   Croitoru, Mihai, MD  lidocaine-prilocaine (EMLA) cream Apply 1  application topically as directed. Apply 1 hour prior to IV stick and cover with plastic wrap Patient not taking: Reported on 05/26/2020 05/19/20   Ladell Pier, MD  metoprolol succinate (TOPROL-XL) 50 MG 24 hr tablet TAKE 1 TABLET(50 MG) BY MOUTH DAILY 11/22/19   Croitoru, Mihai, MD  omeprazole (PRILOSEC) 20 MG capsule Take 1 capsule (20 mg total) by mouth daily. 11/30/19   Croitoru, Mihai, MD  ondansetron (ZOFRAN) 8 MG tablet Take 1 tablet (8 mg total) by mouth every 8 (eight) hours as needed for nausea or vomiting. Patient not taking: Reported on 05/26/2020 05/19/20   Ladell Pier, MD  prochlorperazine (COMPAZINE) 5 MG tablet Take 1-2 tablets (5-10 mg total) by mouth every 6 (six) hours as needed for nausea or vomiting. Patient not taking: Reported on 05/26/2020 05/19/20   Ladell Pier, MD  simvastatin (ZOCOR) 20 MG tablet Take 1 tablet (20 mg total) by mouth at bedtime. 04/28/20   Croitoru, Mihai, MD    Allergies    Gentamycin [gentamicin sulfate], Hydrocodone, and Aspirin  Review of Systems   Review of Systems  Constitutional: Positive for fever. Negative for chills.  HENT: Negative for ear pain and sore throat.   Eyes: Negative for pain and visual disturbance.  Respiratory: Negative for cough and shortness of breath.   Cardiovascular: Negative for chest pain and palpitations.  Gastrointestinal: Positive for nausea. Negative for abdominal pain and vomiting.  Genitourinary: Negative for dysuria and hematuria.  Musculoskeletal: Negative for arthralgias and back pain.  Skin: Negative for color change and rash.  Neurological: Negative for syncope, facial asymmetry, light-headedness and headaches.  All other systems reviewed and are negative.   Physical Exam Updated Vital Signs BP (!) 143/93   Pulse 88   Temp 97.9 F (36.6 C)   Resp 18   SpO2 100%   Physical Exam Vitals and nursing note reviewed.  Constitutional:      Appearance: He is well-developed.  HENT:     Head:  Normocephalic and atraumatic.  Eyes:     Conjunctiva/sclera: Conjunctivae normal.  Cardiovascular:     Rate and Rhythm: Normal rate and regular rhythm.     Heart sounds: No murmur heard.      Comments: Chest wall port left side chest with 1 square inch region of erythema above insertion point, no drainage, no purulence, no significant tenderness Pulmonary:     Effort: Pulmonary effort is normal. No respiratory distress.     Breath sounds: Normal breath sounds.  Abdominal:     General: There is no distension.     Palpations: Abdomen is soft.     Tenderness: There is no abdominal tenderness. There is no guarding.  Musculoskeletal:     Cervical back: Neck supple.  Skin:    General: Skin is warm and dry.  Neurological:  Mental Status: He is alert and oriented to person, place, and time.  Psychiatric:        Mood and Affect: Mood normal.        Behavior: Behavior normal.     ED Results / Procedures / Treatments   Labs (all labs ordered are listed, but only abnormal results are displayed) Labs Reviewed  COMPREHENSIVE METABOLIC PANEL - Abnormal; Notable for the following components:      Result Value   CO2 20 (*)    Glucose, Bld 112 (*)    BUN 24 (*)    Calcium 8.6 (*)    AST 107 (*)    ALT 67 (*)    Alkaline Phosphatase 136 (*)    Total Bilirubin 2.3 (*)    All other components within normal limits  URINALYSIS, ROUTINE W REFLEX MICROSCOPIC - Abnormal; Notable for the following components:   Color, Urine AMBER (*)    Hgb urine dipstick SMALL (*)    All other components within normal limits  CBC WITH DIFFERENTIAL/PLATELET - Abnormal; Notable for the following components:   WBC 15.2 (*)    RDW 16.1 (*)    Platelets 125 (*)    Neutro Abs 14.3 (*)    Lymphs Abs 0.6 (*)    Abs Immature Granulocytes 0.12 (*)    All other components within normal limits  CULTURE, BLOOD (ROUTINE X 2)  CULTURE, BLOOD (ROUTINE X 2)  LIPASE, BLOOD  LACTIC ACID, PLASMA  LACTIC ACID, PLASMA      EKG None  Radiology DG Chest 2 View  Result Date: 05/28/2020 CLINICAL DATA:  Fever.  Active chemotherapy.  Episode of vomiting. EXAM: CHEST - 2 VIEW COMPARISON:  Most recent chest imaging CT 04/12/2020 FINDINGS: Left chest port with tip in the mid SVC. Right-sided pacemaker remains in place. Normal heart size with unchanged mediastinal contours. Retrocardiac hiatal hernia. Aortic atherosclerosis. Streaky bibasilar atelectasis without focal pneumonia. Chronic blunting of the left costophrenic angle is related to prominent epicardial fat pad. No large pleural effusion. No pulmonary edema. No pneumothorax. No acute osseous abnormalities are seen. IMPRESSION: Streaky bibasilar atelectasis. No evidence of pneumonia. Electronically Signed   By: Keith Rake M.D.   On: 05/28/2020 18:38    Procedures Procedures (including critical care time)  Medications Ordered in ED Medications  sodium chloride flush (NS) 0.9 % injection 3 mL (3 mLs Intravenous Given 05/28/20 1853)  amoxicillin-clavulanate (AUGMENTIN) 875-125 MG per tablet 1 tablet (1 tablet Oral Given 05/28/20 2053)    ED Course  I have reviewed the triage vital signs and the nursing notes.  Pertinent labs & imaging results that were available during my care of the patient were reviewed by me and considered in my medical decision making (see chart for details).  86 year old male presented to emergency department with reported fever at home as well as an episode of vomiting earlier today.  He is afebrile on arrival in the ED.  Her family concerns the patient initiated his first round of chemotherapy for pancreatic cancer 3 days ago.  Currently in the ER the patient feels completely fine.  He has a benign cardiopulmonary and abdominal exam.  No focal findings to suggest that he has an acute infectious intra-abdominal process, or pyelonephritis.  X-rays ordered and taken personally reviewed.  These did not show any focal consolidations in  the chest to suggest PNA.  UA negative for leuks and nitrites - doubt UTI  Lactate 1.3 on arrival, 1.2 on repeat.  WBC noted at 15.  No neutropenia.    Overall my clinical suspicion for bacterial sepsis is low given his clinical presentation.  We can consider oral antibiotics and a home watch and wait approach if he strongly wishes to go home tonight.  Clinical Course as of May 29 2315  Sun May 28, 2020  1942 Patient urinating now.  No neutropenia on labs.  I've asked his nurse to obtain a rectal temp as well for accuracy   [MT]  2036 I had a discussion with the patient and his son, as well as Dr Waldron Labs (a family friend and physician, with permission from the family to discuss medical information), reported the patient clinically looks extremely well to me.  No evidence of fever or infection.  His urine was negative.  He did have a minor leukocytosis, which may be reactive after his episode of vomiting.  He feels like his family may have been giving him too much food to eat in the setting of his new initiation of chemo therapy.  This is a possibility.  They would like to go home if possible.  I think it would be reasonable to initiate p.o. Augmentin and discharge home with blood cultures pending.  He has excellent and close supervision with families and doctors at home.  I advised if he spikes a temperature again, or begins feeling unwell, to discuss with his oncologist and/or return to the ER.  If his cultures are positive, he will need to return immediately for IV antibiotics.  They agree with this plan.   [MT]    Clinical Course User Index [MT] Sharon Stapel, Carola Rhine, MD    Final Clinical Impression(s) / ED Diagnoses Final diagnoses:  Fever, unspecified fever cause    Rx / DC Orders ED Discharge Orders         Ordered    amoxicillin-clavulanate (AUGMENTIN) 875-125 MG tablet  Every 12 hours     Discontinue  Reprint     05/28/20 2047           Wyvonnia Dusky, MD 05/28/20  2316

## 2020-05-28 NOTE — ED Triage Notes (Signed)
Pt brought in by family. Reports on Friday had first chemo infusion. Last week had portacath inserted. Family states that having some swelling around surgical site. Pt vomited and about 30 minutes afterwards had fever 101.4 with chills and fatigue. Pt took Compazine for the n/v.

## 2020-05-28 NOTE — Discharge Instructions (Signed)
You were seen in the emergency department today for fever and an episode of vomiting.  You had work-up done in the ER including blood cultures and an infectious work-up.  Fortunately your blood work did not show signs of neutropenia.  We do not see sign of infection in your urine or in your lungs.  Based on your clinical exam, I did not feel like you had an infection or a surgical emergency in your abdomen.    We talked about a plan for discharge home.  I started you on an antibiotic called augmentin while we wait on your blood cultures to result.  This can take 2-3 days on average to get preliminary results.  Please follow up with your oncologist by phone tomorrow.  If you have another fever, call your doctor's office, or else come back to the ER if you cannot reach them.  If you begin having problems breathing, chest pain, abdominal pain, lightheadedness, or feel like passing out, return to the ER immediately.

## 2020-05-29 ENCOUNTER — Other Ambulatory Visit: Payer: Self-pay | Admitting: Genetic Counselor

## 2020-05-29 DIAGNOSIS — C252 Malignant neoplasm of tail of pancreas: Secondary | ICD-10-CM

## 2020-05-29 NOTE — Progress Notes (Signed)
Patient's son Gary [name] calls to make sure we knew that patient had to go to ED yesterday, spike in fever after a vomiting episode to 101.3.  Afrebrile on presentation at ED, Blood cultures were drawn.  Spoke with Dr. Benay Spice is was aware.  Per Dr. Benay Spice informed son to discontinue taking the Augmentin.  Blood culture results are not back yet but we will call him once they come back.  He understands to keep his scheduled appointments and to call back if patient's condition worsens.

## 2020-06-01 ENCOUNTER — Telehealth: Payer: Self-pay

## 2020-06-01 ENCOUNTER — Encounter: Payer: Medicare Other | Admitting: Genetic Counselor

## 2020-06-01 ENCOUNTER — Encounter (HOSPITAL_COMMUNITY): Payer: Self-pay | Admitting: Oncology

## 2020-06-01 ENCOUNTER — Other Ambulatory Visit: Payer: Medicare Other

## 2020-06-01 NOTE — Telephone Encounter (Signed)
Spoke with patient's wife per Dr. Benay Spice informed probably coming from Gemzar.  Can try benadryl 25 mg q 6 hours as needed, cautioned about possible sedation.  She verbalized an understanding.

## 2020-06-01 NOTE — Telephone Encounter (Signed)
-----   Message from Ladell Pier, MD sent at 06/01/2020  1:36 PM EDT ----- Regarding: RE: Rash Could be the gemzar, try benadryl 25 mg q6 hrs prn for pruritis ----- Message ----- From: Jonnie Finner, RN Sent: 06/01/2020   1:03 PM EDT To: Owens Shark, NP, Ladell Pier, MD Subject: Rash                                           Received a call from his son that patient has a rash. Called and spoke to the patient and just noticed the rash this morning after he showered.  It is under his arms, sides of legs and in pelvic area.  Does not itch.  No oozing present.  No SOB.  He received Gemzar on 6/11.  Please advise. Malachy Mood

## 2020-06-02 LAB — CULTURE, BLOOD (ROUTINE X 2)
Culture: NO GROWTH
Culture: NO GROWTH

## 2020-06-03 ENCOUNTER — Other Ambulatory Visit: Payer: Self-pay | Admitting: Oncology

## 2020-06-05 ENCOUNTER — Telehealth: Payer: Self-pay | Admitting: Oncology

## 2020-06-05 ENCOUNTER — Telehealth: Payer: Self-pay

## 2020-06-05 NOTE — Telephone Encounter (Signed)
R/s appt per 6/18 sch message - pt son is aware of appts being updated.

## 2020-06-05 NOTE — Telephone Encounter (Signed)
TC from Pt's son stating that he had called last week in reference to his father having a rash on under his arms, side of his legs and in the pelvic area. Pt's son inquiring if he could take a shower. Informed Pt's son that from the previous call he was told that the rash is probably from the chemo. Informed the son that the rash is a side effect from the Chemo. His father can take a shower and make sure he dries the areas well where the rash is. He can try some hydrocortisone cream to see if that calms the rash and if it should get worse to give [place] a return call. Pt's son verbalized understanding. No further problems or concerns noted.

## 2020-06-08 ENCOUNTER — Telehealth: Payer: Self-pay | Admitting: *Deleted

## 2020-06-08 NOTE — Telephone Encounter (Signed)
RN returned VM from patient's son Laverna Peace) stating patient is experiencing some swelling of both ankles as well as distention of his abdomen.  Bowel movements have been regular.  No N/V/D.  Patient also has some "blue areas" around his pacemaker.  No SOB or chest pain.  Family requests patient to be seen.  Scheduling message sent for appointment in [facility] on Friday 06/09/2020 at 130pm.  Family aware.

## 2020-06-08 NOTE — Telephone Encounter (Signed)
Called to report the swelling in legs/abdomen and bruising on chest near pacemaker. Has appointment w/[facility] tomorrow. Asking if they need to come. Encouraged him to come in case his platelets are low, r/o blood clot and possible need for paracentesis. They agree to come.

## 2020-06-09 ENCOUNTER — Inpatient Hospital Stay: Payer: Medicare Other

## 2020-06-09 ENCOUNTER — Other Ambulatory Visit: Payer: Self-pay

## 2020-06-09 ENCOUNTER — Ambulatory Visit: Payer: Medicare Other | Admitting: Nurse Practitioner

## 2020-06-09 ENCOUNTER — Other Ambulatory Visit: Payer: Medicare Other

## 2020-06-09 ENCOUNTER — Ambulatory Visit: Payer: Medicare Other

## 2020-06-09 ENCOUNTER — Inpatient Hospital Stay (HOSPITAL_BASED_OUTPATIENT_CLINIC_OR_DEPARTMENT_OTHER): Payer: Medicare Other | Admitting: Medical

## 2020-06-09 VITALS — BP 137/70 | HR 69 | Temp 97.9°F | Resp 18 | Ht 66.0 in | Wt 169.0 lb

## 2020-06-09 DIAGNOSIS — R21 Rash and other nonspecific skin eruption: Secondary | ICD-10-CM

## 2020-06-09 DIAGNOSIS — C252 Malignant neoplasm of tail of pancreas: Secondary | ICD-10-CM

## 2020-06-09 DIAGNOSIS — Z5111 Encounter for antineoplastic chemotherapy: Secondary | ICD-10-CM | POA: Diagnosis not present

## 2020-06-09 DIAGNOSIS — K8689 Other specified diseases of pancreas: Secondary | ICD-10-CM

## 2020-06-09 LAB — CMP (CANCER CENTER ONLY)
ALT: 27 U/L (ref 0–44)
AST: 33 U/L (ref 15–41)
Albumin: 2.9 g/dL — ABNORMAL LOW (ref 3.5–5.0)
Alkaline Phosphatase: 167 U/L — ABNORMAL HIGH (ref 38–126)
Anion gap: 8 (ref 5–15)
BUN: 20 mg/dL (ref 8–23)
CO2: 22 mmol/L (ref 22–32)
Calcium: 8.5 mg/dL — ABNORMAL LOW (ref 8.9–10.3)
Chloride: 106 mmol/L (ref 98–111)
Creatinine: 0.96 mg/dL (ref 0.61–1.24)
GFR, Est AFR Am: >60 mL/min (ref >60)
GFR, Estimated: >60 mL/min (ref >60)
Glucose, Bld: 99 mg/dL (ref 70–99)
Potassium: 4.3 mmol/L (ref 3.5–5.1)
Sodium: 136 mmol/L (ref 135–145)
Total Bilirubin: 0.7 mg/dL (ref 0.3–1.2)
Total Protein: 6.6 g/dL (ref 6.5–8.1)

## 2020-06-09 LAB — CBC WITH DIFFERENTIAL (CANCER CENTER ONLY)
Abs Immature Granulocytes: 0.06 10*3/uL (ref 0.00–0.07)
Basophils Absolute: 0.1 10*3/uL (ref 0.0–0.1)
Basophils Relative: 1 %
Eosinophils Absolute: 0.2 10*3/uL (ref 0.0–0.5)
Eosinophils Relative: 2 %
HCT: 34.9 % — ABNORMAL LOW (ref 39.0–52.0)
Hemoglobin: 11.5 g/dL — ABNORMAL LOW (ref 13.0–17.0)
Immature Granulocytes: 1 %
Lymphocytes Relative: 19 %
Lymphs Abs: 1.4 10*3/uL (ref 0.7–4.0)
MCH: 28.9 pg (ref 26.0–34.0)
MCHC: 33 g/dL (ref 30.0–36.0)
MCV: 87.7 fL (ref 80.0–100.0)
Monocytes Absolute: 1.2 10*3/uL — ABNORMAL HIGH (ref 0.1–1.0)
Monocytes Relative: 16 %
Neutro Abs: 4.6 10*3/uL (ref 1.7–7.7)
Neutrophils Relative %: 61 %
Platelet Count: 361 10*3/uL (ref 150–400)
RBC: 3.98 MIL/uL — ABNORMAL LOW (ref 4.22–5.81)
RDW: 16.7 % — ABNORMAL HIGH (ref 11.5–15.5)
WBC Count: 7.6 10*3/uL (ref 4.0–10.5)
nRBC: 0 % (ref 0.0–0.2)

## 2020-06-09 MED ORDER — SODIUM CHLORIDE 0.9% FLUSH
10.0000 mL | INTRAVENOUS | Status: DC | PRN
  Administered 2020-06-09: 10 mL via INTRAVENOUS
  Filled 2020-06-09: qty 10

## 2020-06-09 MED ORDER — TRIAMCINOLONE ACETONIDE 0.1 % EX LOTN: 1.0000 "application " | TOPICAL_LOTION | Freq: Three times a day (TID) | CUTANEOUS | 2 refills | Status: DC

## 2020-06-09 NOTE — Progress Notes (Signed)
[facility]pt[facility] Management Clinic Progress Note   Gary [name] [id number] Apr 25, 1932 86 y.o.  Gary [name] is managed by Dr. Benay Spice.  Actively treated with chemotherapy/immunotherapy/hormonal therapy: yes  Current therapy: Abraxane/Gemcitabine  Last treated: 06 / 11 / 2021  Next scheduled appointment with provider: 06 / 28 / 2021  Assessment: Plan:    Rash - Plan: triamcinolone lotion (KENALOG) 0.1 %   Rash: Patient experienced a full body rash after last treatment two weeks ago that was pretty well controlled with benadryl and hydrocortisone. Patient was prescribed triamcinolone cream to use for rash as needed after future treatments.  Leg/Abdomen Swelling: The patient has noticed an increase in his leg swelling as well as some abdominal distension recently. Bilateral lower extremity pitting edema present on physical exam. Some swelling was attributed to his amlodipine being taken for hypertension, as well as his albumin of 2.9 on today's visit. He was encouraged to increase his protein and calorie intake. He was also encouraged to continue walking each day.    Please see After Visit Summary for patient specific instructions.  Future Appointments  Date Time Provider [place]  06/12/2020 11:00 AM [facility] LAB 5 [facility] None  06/12/2020 11:15 AM [facility] [place] FLUSH [facility] None  06/12/2020 11:45 AM Owens Shark, NP [facility] None  06/12/2020  1:30 PM [facility] INFUSION [facility] None  06/26/2020 11:30 AM [facility] LAB 4 [facility] None  06/26/2020 11:45 AM [facility] [place] FLUSH [facility] None  06/26/2020 12:15 PM Owens Shark, NP [facility] None  06/26/2020  1:00 PM [facility] INFUSION [facility] None    Orders Placed This Encounter  Procedures  . CBC with Differential       Subjective:   Patient ID:  Gary [name] is a 86 y.o. (DOB 1932/03/03) male.  Chief Complaint: No chief complaint on file.   HPI Gary [name] is a very pleasant 85  year old male patient with a diagnosis of metastatic pancreas cancer. He is currently managed by Dr. Benay Spice. His son accompanied him to his visit at the symptom management clinic today. He presented with concerns of a recent full body rash, abdominal distension, and increased leg swelling. The patient stated he had a rash on bilateral arms and legs, as well as around his abdomen and back, after his last treatment. The patient's son said the rash was managed well by benadryl and hydrocortisone cream. The patient and his son had questions regarding whether the rash was from his treatment and what type of future management was needed. The patient also had some concerns about abdominal distension and extra leg swelling. He denied any pain in his abdomen or his legs, but states he has noticed a little weight gain. The patient stated he currently takes amlodipine for hypertension, and was wondering if this contributed to his leg swelling. The patient also stated he has a good appetite and his family tries to give him lots of protein during the day. They had questions regarding how much protein and food he should be eating to get adequate nutrition during his treatments. The patient denied any fevers, chills, chest pain, shortness of breath, abdominal pain, or changes in bowel habits. He denied any smoking or alcohol consumption. He also stated that he has been feeling very good lately and makes sure to walk each day for exercise.   Medications: I have reviewed the patient's current medications.  Allergies:  Allergies  Allergen Reactions  . Gentamycin [Gentamicin Sulfate]     "Inner ears  problems after Gent infusion" , also "unsteady mobility"  . Hydrocodone   . Aspirin Other (See Comments)    High doses cause stomach upset    Past Medical History:  Diagnosis Date  . Ascending aortic aneurysm ([place]) 01/13/2014   stable at 4.7 cm diameter since 2009  . Asystole x2, s/p pacemaker 01/13/2014  . Bradycardia      permanent pacemaker 11/22/08 St.Jude  . Cancer ([place])   . Family history of adverse reaction to anesthesia    brother died after anesthesia / nose polyp removal at [facility] (40 years ago)  . HTN (hypertension) 01/13/2014  . Hyperlipidemia   . Hypertension   . Pacemaker - leads 1987, last generator 2009 St. Jude dual-chamber 01/13/2014   Atrial lead model 433-01, ventricular lead model 431-01 implanted November 1987 Right ventricular lead dysfunction with low impedance and mediocre sensing but satisfactory pacing threshold  . Pacemaker lead malfunction 01/13/2014   Chronic low impedance and poor sensing of the ventricular lead    Past Surgical History:  Procedure Laterality Date  . IR IMAGING GUIDED PORT INSERTION  05/24/2020  . PERMANENT PACEMAKER GENERATOR CHANGE  11/22/08   ST. Jude  . PERMANENT PACEMAKER INSERTION  03/04/97   ST. Jude  . US ECHOCARDIOGRAPHY  05/16/11   AS mild to mod.,TR mild to mod., EF => 55%.    Family History  Problem Relation Age of Onset  . Cancer Mother   . Heart attack Father     Social History   Socioeconomic History  . Marital status: Married    Spouse name: Not on file  . Number of children: Not on file  . Years of education: Not on file  . Highest education level: Not on file  Occupational History  . Not on file  Tobacco Use  . Smoking status: Never Smoker  . Smokeless tobacco: Never Used  Vaping Use  . Vaping Use: Never used  Substance and Sexual Activity  . Alcohol use: Yes    Comment: occas.  . Drug use: No  . Sexual activity: Not on file  Other Topics Concern  . Not on file  Social History Narrative  . Not on file   Social Determinants of Health   Financial Resource Strain:   . Difficulty of Paying Living Expenses:   Food Insecurity:   . Worried About Charity fundraiser in the Last Year:   . Arboriculturist in the Last Year:   Transportation Needs:   . Film/video editor (Medical):   Marland Kitchen Lack of Transportation (Non-Medical):    Physical Activity:   . Days of Exercise per Week:   . Minutes of Exercise per Session:   Stress:   . Feeling of Stress :   Social Connections:   . Frequency of Communication with Friends and Family:   . Frequency of Social Gatherings with Friends and Family:   . Attends Religious Services:   . Active Member of Clubs or Organizations:   . Attends Archivist Meetings:   Marland Kitchen Marital Status:   Intimate Partner Violence:   . Fear of Current or Ex-Partner:   . Emotionally Abused:   Marland Kitchen Physically Abused:   . Sexually Abused:     Past Medical History, Surgical history, Social history, and Family history were reviewed and updated as appropriate.   Please see review of systems for further details on the patient's review from today.   Review of Systems:  Review of Systems  Constitutional:  Negative for appetite change, chills, fatigue and fever.  Respiratory: Negative for cough, chest tightness and shortness of breath.   Cardiovascular: Positive for leg swelling. Negative for chest pain and palpitations.  Gastrointestinal: Positive for abdominal distention and diarrhea. Negative for abdominal pain, blood in stool, constipation, nausea and vomiting.  Skin: Positive for color change.       Recent full body rash after treatment  Hematological: Negative for adenopathy.       Reported some blue spots around his pacemaker a few days ago    Objective:   Physical Exam:  BP 137/70 (BP Location: Left Arm, Patient Position: Sitting)   Pulse 69   Temp 97.9 F (36.6 C) (Temporal)   Resp 18   Ht 5\' 6"  (1.676 m)   Wt 169 lb (76.7 kg)   SpO2 98%   BMI 27.28 kg/m  ECOG: 0  Physical Exam Constitutional:      General: He is not in acute distress.    Appearance: Normal appearance. He is normal weight. He is not ill-appearing.  HENT:     Head: Normocephalic and atraumatic.  Cardiovascular:     Rate and Rhythm: Normal rate and regular rhythm.     Pulses: Normal pulses.     Heart  sounds: Normal heart sounds. No murmur heard.  No friction rub. No gallop.   Pulmonary:     Effort: Pulmonary effort is normal.     Breath sounds: Normal breath sounds.  Abdominal:     General: Bowel sounds are normal. There is distension.     Palpations: Abdomen is soft.     Tenderness: There is no abdominal tenderness.  Musculoskeletal:        General: Swelling present. No tenderness.     Cervical back: Normal range of motion and neck supple. No rigidity or tenderness.     Right lower leg: Edema present.     Left lower leg: Edema present.  Lymphadenopathy:     Cervical: No cervical adenopathy.  Skin:    General: Skin is warm and dry.     Findings: No bruising, erythema or rash.  Neurological:     Mental Status: He is alert.  Psychiatric:        Mood and Affect: Mood normal.        Behavior: Behavior normal.        Thought Content: Thought content normal.        Judgment: Judgment normal.     Lab Review:     Component Value Date/Time   NA 136 06/09/2020 1303   NA 142 03/23/2020 1428   K 4.3 06/09/2020 1303   CL 106 06/09/2020 1303   CO2 22 06/09/2020 1303   GLUCOSE 99 06/09/2020 1303   BUN 20 06/09/2020 1303   BUN 18 03/23/2020 1428   CREATININE 0.96 06/09/2020 1303   CREATININE 0.99 07/10/2016 1043   CALCIUM 8.5 (L) 06/09/2020 1303   PROT 6.6 06/09/2020 1303   ALBUMIN 2.9 (L) 06/09/2020 1303   AST 33 06/09/2020 1303   ALT 27 06/09/2020 1303   ALKPHOS 167 (H) 06/09/2020 1303   BILITOT 0.7 06/09/2020 1303   GFRNONAA >60 06/09/2020 1303   GFRAA >60 06/09/2020 1303       Component Value Date/Time   WBC 7.6 06/09/2020 1303   WBC 15.2 (H) 05/28/2020 1759   RBC 3.98 (L) 06/09/2020 1303   HGB 11.5 (L) 06/09/2020 1303   HCT 34.9 (L) 06/09/2020 1303   PLT 361 06/09/2020 1303  MCV 87.7 06/09/2020 1303   MCH 28.9 06/09/2020 1303   MCHC 33.0 06/09/2020 1303   RDW 16.7 (H) 06/09/2020 1303   LYMPHSABS 1.4 06/09/2020 1303   MONOABS 1.2 (H) 06/09/2020 1303    EOSABS 0.2 06/09/2020 1303   BASOSABS 0.1 06/09/2020 1303   -------------------------------  Imaging from last 24 hours (if applicable):  Radiology interpretation: DG Chest 2 View  Result Date: 05/28/2020 CLINICAL DATA:  Fever.  Active chemotherapy.  Episode of vomiting. EXAM: CHEST - 2 VIEW COMPARISON:  Most recent chest imaging CT 04/12/2020 FINDINGS: Left chest port with tip in the mid SVC. Right-sided pacemaker remains in place. Normal heart size with unchanged mediastinal contours. Retrocardiac hiatal hernia. Aortic atherosclerosis. Streaky bibasilar atelectasis without focal pneumonia. Chronic blunting of the left costophrenic angle is related to prominent epicardial fat pad. No large pleural effusion. No pulmonary edema. No pneumothorax. No acute osseous abnormalities are seen. IMPRESSION: Streaky bibasilar atelectasis. No evidence of pneumonia. Electronically Signed   By: Keith Rake M.D.   On: 05/28/2020 18:38   IR IMAGING GUIDED PORT INSERTION  Result Date: 05/24/2020 INDICATION: 86 year old male with metastatic pancreatic cancer in need of durable venous access for chemotherapy. EXAM: IMPLANTED PORT A CATH PLACEMENT WITH ULTRASOUND AND FLUOROSCOPIC GUIDANCE MEDICATIONS: 2 g Ancef; The antibiotic was administered within an appropriate time interval prior to skin puncture. ANESTHESIA/SEDATION: Versed 1.5 mg IV; Fentanyl 75 mcg IV; Moderate Sedation Time:  21 minutes The patient was continuously monitored during the procedure by the interventional radiology nurse under my direct supervision. FLUOROSCOPY TIME:  0 minutes, 42 seconds (11 mGy) COMPLICATIONS: None immediate. PROCEDURE: The left neck and chest was prepped with chlorhexidine, and draped in the usual sterile fashion using maximum barrier technique (cap and mask, sterile gown, sterile gloves, large sterile sheet, hand hygiene and cutaneous antiseptic). Local anesthesia was attained by infiltration with 1% lidocaine with  epinephrine. Ultrasound demonstrated patency of the left internal jugular vein vein, and this was documented with an image. Under real-time ultrasound guidance, this vein was accessed with a 21 gauge micropuncture needle and image documentation was performed. A small dermatotomy was made at the access site with an 11 scalpel. A 0.018" wire was advanced into the SVC and the access needle exchanged for a 30F micropuncture vascular sheath. The 0.018" wire was then removed and a 0.035" wire advanced into the IVC. An appropriate location for the subcutaneous reservoir was selected below the clavicle and an incision was made through the skin and underlying soft tissues. The subcutaneous tissues were then dissected using a combination of blunt and sharp surgical technique and a pocket was formed. A single lumen power injectable portacatheter was then tunneled through the subcutaneous tissues from the pocket to the dermatotomy and the port reservoir placed within the subcutaneous pocket. The venous access site was then serially dilated and a peel away vascular sheath placed over the wire. The wire was removed and the port catheter advanced into position under fluoroscopic guidance. The catheter tip is positioned in the upper right atrium. This was documented with a spot image. The portacatheter was then tested and found to flush and aspirate well. The port was flushed with saline followed by 100 units/mL heparinized saline. The pocket was then closed in two layers using first subdermal inverted interrupted absorbable sutures followed by a running subcuticular suture. The epidermis was then sealed with Dermabond. The dermatotomy at the venous access site was also closed with Dermabond. IMPRESSION: Successful placement of a left IJ approach  Power Port with ultrasound and fluoroscopic guidance. The catheter is ready for use. Electronically Signed   By: Jacqulynn Cadet M.D.   On: 05/24/2020 15:09

## 2020-06-09 NOTE — Progress Notes (Signed)
Patient was seen by Sandi Mealy, PA.

## 2020-06-12 ENCOUNTER — Inpatient Hospital Stay: Payer: Medicare Other

## 2020-06-12 ENCOUNTER — Inpatient Hospital Stay (HOSPITAL_BASED_OUTPATIENT_CLINIC_OR_DEPARTMENT_OTHER): Payer: Medicare Other | Admitting: Nurse Practitioner

## 2020-06-12 ENCOUNTER — Encounter: Payer: Self-pay | Admitting: Nurse Practitioner

## 2020-06-12 ENCOUNTER — Other Ambulatory Visit: Payer: Self-pay

## 2020-06-12 VITALS — BP 144/82 | HR 70 | Temp 97.7°F | Resp 18 | Ht 66.0 in | Wt 170.2 lb

## 2020-06-12 DIAGNOSIS — Z95828 Presence of other vascular implants and grafts: Secondary | ICD-10-CM | POA: Insufficient documentation

## 2020-06-12 DIAGNOSIS — C252 Malignant neoplasm of tail of pancreas: Secondary | ICD-10-CM

## 2020-06-12 DIAGNOSIS — Z5111 Encounter for antineoplastic chemotherapy: Secondary | ICD-10-CM | POA: Diagnosis not present

## 2020-06-12 LAB — CMP (CANCER CENTER ONLY)
ALT: 38 U/L (ref 0–44)
AST: 37 U/L (ref 15–41)
Albumin: 2.8 g/dL — ABNORMAL LOW (ref 3.5–5.0)
Alkaline Phosphatase: 177 U/L — ABNORMAL HIGH (ref 38–126)
Anion gap: 9 (ref 5–15)
BUN: 23 mg/dL (ref 8–23)
CO2: 26 mmol/L (ref 22–32)
Calcium: 8.6 mg/dL — ABNORMAL LOW (ref 8.9–10.3)
Chloride: 104 mmol/L (ref 98–111)
Creatinine: 0.86 mg/dL (ref 0.61–1.24)
GFR, Est AFR Am: >60 mL/min (ref >60)
GFR, Estimated: >60 mL/min (ref >60)
Glucose, Bld: 84 mg/dL (ref 70–99)
Potassium: 4 mmol/L (ref 3.5–5.1)
Sodium: 139 mmol/L (ref 135–145)
Total Bilirubin: 0.6 mg/dL (ref 0.3–1.2)
Total Protein: 6.5 g/dL (ref 6.5–8.1)

## 2020-06-12 LAB — CBC WITH DIFFERENTIAL (CANCER CENTER ONLY)
Abs Immature Granulocytes: 0.08 10*3/uL — ABNORMAL HIGH (ref 0.00–0.07)
Basophils Absolute: 0.1 10*3/uL (ref 0.0–0.1)
Basophils Relative: 1 %
Eosinophils Absolute: 0.2 10*3/uL (ref 0.0–0.5)
Eosinophils Relative: 2 %
HCT: 34.8 % — ABNORMAL LOW (ref 39.0–52.0)
Hemoglobin: 11.4 g/dL — ABNORMAL LOW (ref 13.0–17.0)
Immature Granulocytes: 1 %
Lymphocytes Relative: 17 %
Lymphs Abs: 1.8 10*3/uL (ref 0.7–4.0)
MCH: 29 pg (ref 26.0–34.0)
MCHC: 32.8 g/dL (ref 30.0–36.0)
MCV: 88.5 fL (ref 80.0–100.0)
Monocytes Absolute: 1.7 10*3/uL — ABNORMAL HIGH (ref 0.1–1.0)
Monocytes Relative: 16 %
Neutro Abs: 7 10*3/uL (ref 1.7–7.7)
Neutrophils Relative %: 63 %
Platelet Count: 420 10*3/uL — ABNORMAL HIGH (ref 150–400)
RBC: 3.93 MIL/uL — ABNORMAL LOW (ref 4.22–5.81)
RDW: 17.2 % — ABNORMAL HIGH (ref 11.5–15.5)
WBC Count: 10.9 10*3/uL — ABNORMAL HIGH (ref 4.0–10.5)
nRBC: 0 % (ref 0.0–0.2)

## 2020-06-12 LAB — GENETIC SCREENING ORDER

## 2020-06-12 MED ORDER — SODIUM CHLORIDE 0.9 % IV SOLN
1000.0000 mg/m2 | Freq: Once | INTRAVENOUS | Status: AC
  Administered 2020-06-12: 1862 mg via INTRAVENOUS
  Filled 2020-06-12: qty 48.97

## 2020-06-12 MED ORDER — PROCHLORPERAZINE MALEATE 10 MG PO TABS
ORAL_TABLET | ORAL | Status: AC
  Filled 2020-06-12: qty 1

## 2020-06-12 MED ORDER — PACLITAXEL PROTEIN-BOUND CHEMO INJECTION 100 MG
100.0000 mg/m2 | Freq: Once | INTRAVENOUS | Status: AC
  Administered 2020-06-12: 200 mg via INTRAVENOUS
  Filled 2020-06-12: qty 40

## 2020-06-12 MED ORDER — PROCHLORPERAZINE MALEATE 10 MG PO TABS
10.0000 mg | ORAL_TABLET | Freq: Once | ORAL | Status: AC
  Administered 2020-06-12: 10 mg via ORAL

## 2020-06-12 MED ORDER — HEPARIN SOD (PORK) LOCK FLUSH 100 UNIT/ML IV SOLN
500.0000 [IU] | Freq: Once | INTRAVENOUS | Status: AC | PRN
  Administered 2020-06-12: 500 [IU]
  Filled 2020-06-12: qty 5

## 2020-06-12 MED ORDER — SODIUM CHLORIDE 0.9% FLUSH
10.0000 mL | INTRAVENOUS | Status: DC | PRN
  Administered 2020-06-12: 10 mL
  Filled 2020-06-12: qty 10

## 2020-06-12 MED ORDER — SODIUM CHLORIDE 0.9 % IV SOLN
Freq: Once | INTRAVENOUS | Status: AC
  Filled 2020-06-12: qty 250

## 2020-06-12 NOTE — Patient Instructions (Signed)
[facility] Discharge Instructions for Patients Receiving Chemotherapy  Today you received the following chemotherapy agents: Abraxane/Gemzar.  To help prevent nausea and vomiting after your treatment, we encourage you to take your nausea medication as directed.   If you develop nausea and vomiting that is not controlled by your nausea medication, call the clinic.   BELOW ARE SYMPTOMS THAT SHOULD BE REPORTED IMMEDIATELY:  *FEVER GREATER THAN 100.5 F  *CHILLS WITH OR WITHOUT FEVER  NAUSEA AND VOMITING THAT IS NOT CONTROLLED WITH YOUR NAUSEA MEDICATION  *UNUSUAL SHORTNESS OF BREATH  *UNUSUAL BRUISING OR BLEEDING  TENDERNESS IN MOUTH AND THROAT WITH OR WITHOUT PRESENCE OF ULCERS  *URINARY PROBLEMS  *BOWEL PROBLEMS  UNUSUAL RASH Items with * indicate a potential emergency and should be followed up as soon as possible.  Feel free to call the clinic should you have any questions or concerns. The clinic phone number is [phone].  Please show the CHEMO ALERT CARD at check-in to the Emergency Department and triage nurse.   

## 2020-06-12 NOTE — Progress Notes (Addendum)
[facility] OFFICE PROGRESS NOTE   Diagnosis: Pancreas cancer  INTERVAL HISTORY:   Mr. Spadafore returns as scheduled.  He was seen in the emergency department on 05/28/2020 following an episode of nausea/vomiting and temperature 101.3 degrees.  Chest x-ray was negative for pneumonia.  Urinalysis was negative for infection.  Blood cultures were obtained and remained negative.  He completed an outpatient course of Augmentin.  He took nausea medicine for a few days after the emergency room appointment and had no further nausea/vomiting.  No further fever.  He notes an alteration in taste.  No mouth sores.  No diarrhea.  Some constipation.  Around day 5 or 6 after the chemotherapy he developed a pruritic rash on his abdomen and extremities.  He took Benadryl and applied hydrocortisone cream.  The rash has resolved.  Objective:  Vital signs in last 24 hours:  Blood pressure (!) 144/82, pulse 70, temperature 97.7 F (36.5 C), temperature source Temporal, resp. rate 18, height 5\' 6"  (1.676 m), weight 170 lb 3.2 oz (77.2 kg), SpO2 99 %.    HEENT: No thrush or ulcers. Resp: Rales at both lung bases left greater than right.  No respiratory distress. Cardio: Regular rate and rhythm. GI: Abdomen is soft and nontender.  No apparent ascites.  No hepatomegaly. Vascular: Pitting edema at the lower legs bilaterally.  Skin: Fading rash over the lower abdominal wall. Port-A-Cath without erythema.   Lab Results:  Lab Results  Component Value Date   WBC 10.9 (H) 06/12/2020   HGB 11.4 (L) 06/12/2020   HCT 34.8 (L) 06/12/2020   MCV 88.5 06/12/2020   PLT 420 (H) 06/12/2020   NEUTROABS 7.0 06/12/2020    Imaging:  No results found.  Medications: I have reviewed the patient's current medications.  Assessment/Plan: 1. Pancreas tail lesion, multiple liver lesions   Chest CT 04/12/2020-ascending thoracic aortic aneurysm; hypovascular lesion tail of the pancreas, new compared to the  prior examination. Multiple poorly defined hypovascular lesions scattered throughout the liver   CT abdomen/pelvis 05/02/2020-hypoenhancing lesion of the pancreatic tail measuring approximately 2.4 x 1.9 cm; enlarged portacaval lymph nodes measuring up to 1.9 x 1.4 cm; numerous hypodense lesions throughout the liver, index lesion of the liver dome measuring 1.7 x 1.2 cm.  Ultrasound-guided biopsy of a left liver lesion 05/10/2020-poorly differentiated carcinoma consistent with a pancreatobiliary primary  Markedly elevated CA 19-9   Cycle 1 gemcitabine/Abraxane 05/26/2020 2. Thoracic aortic aneurysm 3. Hypertension 4. Hyperlipidemia 5. History of bradycardia status post permanent pacemaker 6. Mother hadcervicalcancer  Disposition: Mr. Partch appears stable.  He has completed 1 cycle of gemcitabine/Abraxane.  Plan to proceed with cycle 2 today as scheduled.    The fever 2 days after cycle 1 was likely related to gemcitabine.  Rash also likely related to gemcitabine.  He will contact the office if either recur.  We reviewed the CBC from today.  Counts adequate to proceed as above.  He will return for lab, follow-up, cycle 3 gemcitabine/Abraxane in 2 weeks.  He will contact the office in the interim as outlined above or with any other problems.  Patient seen with Dr. Benay Spice.    Ned Card ANP/GNP-BC   06/12/2020  1:07 PM This was a shared visit with Ned Card.  Mr. Bagot was interviewed and examined.  The fever and rash are likely related to gemcitabine.  He will take Tylenol and Benadryl for recurrent symptoms.  Leg swelling is likely secondary to hypoalbuminemia and amlodipine.  Julieanne Manson,  MD

## 2020-06-13 LAB — CANCER ANTIGEN 19-9: CA 19-9: 30796 U/mL — ABNORMAL HIGH (ref 0–35)

## 2020-06-14 ENCOUNTER — Telehealth: Payer: Self-pay | Admitting: Oncology

## 2020-06-14 ENCOUNTER — Telehealth: Payer: Self-pay | Admitting: Medical Oncology

## 2020-06-14 NOTE — Telephone Encounter (Signed)
Scheduled per 6/28 sch msg. Called and spoke with pt, confirmed appts  

## 2020-06-14 NOTE — Telephone Encounter (Signed)
Today symptoms-Pt very "wiped out and fatigued" after chemo on Monday . Breathing more labored when he was asleep . His son said today is the first time they gave him 10 mg compazine to prevent nausea. He usually takes 5 mg.  I told the son that the symptoms the pt exhibited are probably related to the compazine dose. They will back it down to 5 mg prn.  He is awake now and sitting in chair and his breathing is normal.  I told son to call back tomorrow and let us know how his father is feeling.

## 2020-06-15 ENCOUNTER — Telehealth: Payer: Self-pay | Admitting: Emergency Medicine

## 2020-06-15 ENCOUNTER — Inpatient Hospital Stay: Payer: Medicare Other

## 2020-06-15 ENCOUNTER — Inpatient Hospital Stay: Payer: Medicare Other | Attending: Oncology | Admitting: Medical

## 2020-06-15 ENCOUNTER — Other Ambulatory Visit: Payer: Self-pay

## 2020-06-15 ENCOUNTER — Ambulatory Visit (HOSPITAL_COMMUNITY)
Admission: RE | Admit: 2020-06-15 | Discharge: 2020-06-15 | Disposition: A | Discharge status: Home / Self Care | Payer: Medicare Other | Source: Ambulatory Visit | Attending: Medical | Admitting: Medical

## 2020-06-15 VITALS — BP 120/79 | HR 96 | Temp 97.9°F | Resp 18 | Ht 66.0 in | Wt 172.2 lb

## 2020-06-15 DIAGNOSIS — R630 Anorexia: Secondary | ICD-10-CM | POA: Insufficient documentation

## 2020-06-15 DIAGNOSIS — I712 Thoracic aortic aneurysm, without rupture: Secondary | ICD-10-CM | POA: Insufficient documentation

## 2020-06-15 DIAGNOSIS — I10 Essential (primary) hypertension: Secondary | ICD-10-CM | POA: Diagnosis not present

## 2020-06-15 DIAGNOSIS — R110 Nausea: Secondary | ICD-10-CM

## 2020-06-15 DIAGNOSIS — Z452 Encounter for adjustment and management of vascular access device: Secondary | ICD-10-CM | POA: Insufficient documentation

## 2020-06-15 DIAGNOSIS — R0602 Shortness of breath: Secondary | ICD-10-CM

## 2020-06-15 DIAGNOSIS — D72823 Leukemoid reaction: Secondary | ICD-10-CM | POA: Insufficient documentation

## 2020-06-15 DIAGNOSIS — M7989 Other specified soft tissue disorders: Secondary | ICD-10-CM | POA: Insufficient documentation

## 2020-06-15 DIAGNOSIS — K769 Liver disease, unspecified: Secondary | ICD-10-CM | POA: Insufficient documentation

## 2020-06-15 DIAGNOSIS — Z95828 Presence of other vascular implants and grafts: Secondary | ICD-10-CM

## 2020-06-15 DIAGNOSIS — J90 Pleural effusion, not elsewhere classified: Secondary | ICD-10-CM | POA: Diagnosis not present

## 2020-06-15 DIAGNOSIS — R5381 Other malaise: Secondary | ICD-10-CM | POA: Insufficient documentation

## 2020-06-15 DIAGNOSIS — Z5111 Encounter for antineoplastic chemotherapy: Secondary | ICD-10-CM | POA: Insufficient documentation

## 2020-06-15 DIAGNOSIS — C252 Malignant neoplasm of tail of pancreas: Secondary | ICD-10-CM | POA: Diagnosis not present

## 2020-06-15 DIAGNOSIS — R5383 Other fatigue: Secondary | ICD-10-CM

## 2020-06-15 DIAGNOSIS — E785 Hyperlipidemia, unspecified: Secondary | ICD-10-CM | POA: Insufficient documentation

## 2020-06-15 DIAGNOSIS — K449 Diaphragmatic hernia without obstruction or gangrene: Secondary | ICD-10-CM | POA: Diagnosis not present

## 2020-06-15 LAB — CMP (CANCER CENTER ONLY)
ALT: 51 U/L — ABNORMAL HIGH (ref 0–44)
AST: 71 U/L — ABNORMAL HIGH (ref 15–41)
Albumin: 2.6 g/dL — ABNORMAL LOW (ref 3.5–5.0)
Alkaline Phosphatase: 159 U/L — ABNORMAL HIGH (ref 38–126)
Anion gap: 11 (ref 5–15)
BUN: 29 mg/dL — ABNORMAL HIGH (ref 8–23)
CO2: 20 mmol/L — ABNORMAL LOW (ref 22–32)
Calcium: 8.4 mg/dL — ABNORMAL LOW (ref 8.9–10.3)
Chloride: 103 mmol/L (ref 98–111)
Creatinine: 0.95 mg/dL (ref 0.61–1.24)
GFR, Est AFR Am: >60 mL/min (ref >60)
GFR, Estimated: >60 mL/min (ref >60)
Glucose, Bld: 150 mg/dL — ABNORMAL HIGH (ref 70–99)
Potassium: 4.1 mmol/L (ref 3.5–5.1)
Sodium: 134 mmol/L — ABNORMAL LOW (ref 135–145)
Total Bilirubin: 1.7 mg/dL — ABNORMAL HIGH (ref 0.3–1.2)
Total Protein: 6.6 g/dL (ref 6.5–8.1)

## 2020-06-15 LAB — CBC WITH DIFFERENTIAL (CANCER CENTER ONLY)
Abs Immature Granulocytes: 0.44 10*3/uL — ABNORMAL HIGH (ref 0.00–0.07)
Basophils Absolute: 0 10*3/uL (ref 0.0–0.1)
Basophils Relative: 0 %
Eosinophils Absolute: 0 10*3/uL (ref 0.0–0.5)
Eosinophils Relative: 0 %
HCT: 29.5 % — ABNORMAL LOW (ref 39.0–52.0)
Hemoglobin: 10 g/dL — ABNORMAL LOW (ref 13.0–17.0)
Immature Granulocytes: 3 %
Lymphocytes Relative: 2 %
Lymphs Abs: 0.3 10*3/uL — ABNORMAL LOW (ref 0.7–4.0)
MCH: 29.3 pg (ref 26.0–34.0)
MCHC: 33.9 g/dL (ref 30.0–36.0)
MCV: 86.5 fL (ref 80.0–100.0)
Monocytes Absolute: 0.1 10*3/uL (ref 0.1–1.0)
Monocytes Relative: 1 %
Neutro Abs: 15.1 10*3/uL — ABNORMAL HIGH (ref 1.7–7.7)
Neutrophils Relative %: 94 %
Platelet Count: 192 10*3/uL (ref 150–400)
RBC: 3.41 MIL/uL — ABNORMAL LOW (ref 4.22–5.81)
RDW: 17.1 % — ABNORMAL HIGH (ref 11.5–15.5)
WBC Count: 16 10*3/uL — ABNORMAL HIGH (ref 4.0–10.5)
nRBC: 0 % (ref 0.0–0.2)

## 2020-06-15 LAB — SAMPLE TO BLOOD BANK

## 2020-06-15 MED ORDER — SULFAMETHOXAZOLE-TRIMETHOPRIM 400-80 MG PO TABS
2.0000 | ORAL_TABLET | Freq: Two times a day (BID) | ORAL | 0 refills | Status: DC
Start: 2020-06-15 — End: 2020-06-22

## 2020-06-15 MED ORDER — SODIUM CHLORIDE 0.9% FLUSH
10.0000 mL | INTRAVENOUS | Status: DC | PRN
  Administered 2020-06-15: 10 mL
  Filled 2020-06-15: qty 10

## 2020-06-15 MED ORDER — HEPARIN SOD (PORK) LOCK FLUSH 100 UNIT/ML IV SOLN
500.0000 [IU] | Freq: Once | INTRAVENOUS | Status: AC | PRN
  Administered 2020-06-15: 500 [IU]
  Filled 2020-06-15: qty 5

## 2020-06-15 NOTE — Patient Instructions (Signed)

## 2020-06-15 NOTE — Telephone Encounter (Signed)
Received call on triage line from pt's son Laverna Peace requesting pt to be seen today for continued increased/more labored breathing rate with some mild wheezing and severe fatigue.  Reports temperatures yesterday of up to 100.8 orally (managed with Tylenol).  Per Dr. Benay Spice pt to be seen in [facility] today, agreed to [place] appts at 1300/1330.  High priority scheduling message sent, lab orders in place.

## 2020-06-20 NOTE — Progress Notes (Signed)
[facility] Progress Note   Gary [name] [id number] [birth year] 86 y.o.  Gary [name] is managed by Dr. Dominica Severin B. Sherrill  Actively treated with chemotherapy/immunotherapy/hormonal therapy: yes  Current therapy: Gemcitabine and Abraxane  Last treated: 06/12/2020 (cycle 1, day 15)  Next scheduled appointment with provider: 06/26/2020  Assessment: Plan:    Port-A-Cath in place - Plan: heparin lock flush 100 unit/mL, sodium chloride flush (NS) 0.9 % injection 10 mL  Cancer of pancreas, tail ([place]) - Plan: heparin lock flush 100 unit/mL, sodium chloride flush (NS) 0.9 % injection 10 mL  Shortness of breath - Plan: DG Chest 2 View  Leukemoid reaction - Plan: DG Chest 2 View  Nausea without vomiting   Cancer of the pancreas: The patient continues to be followed by Dr. Benay Spice and is status post cycle 1, day 15 of gemcitabine and Abraxane.  Dr. Benay Spice will plan on adding Zofran to the patient's regiment.  He will return as scheduled on 06/26/2020.  Shortness of breath with a leukemoid reaction: A CBC returned today with a WBC of 16 and an ANC of 15.1.  The patient was referred for a chest x-ray and was given a prescription for Bactrim DS p.o. twice daily x7 days.  Nausea: Patient was given a prescription for Zofran.  Dr. Benay Spice will add Zofran to his regiment.  Patient was also told to increase his omeprazole to 40 mg once daily.  Please see After Visit Summary for patient specific instructions.  Future Appointments  Date Time Provider [place]  06/26/2020 11:30 AM [facility] LAB 4 [facility] None  06/26/2020 11:45 AM [facility] [place] FLUSH [facility] None  06/26/2020 12:15 PM Owens Shark, NP [facility] None  06/26/2020  1:00 PM [facility] INFUSION [facility] None  07/11/2020 11:30 AM [facility] LAB 3 [facility] None  07/11/2020 11:45 AM [facility] [place] FLUSH [facility] None  07/11/2020 12:15 PM Owens Shark, NP [facility] None  07/11/2020  1:15 PM  [facility] INFUSION [facility] None  07/24/2020 12:30 PM [facility] LAB 6 [facility] None  07/24/2020 12:45 PM [facility] [place] FLUSH [facility] None  07/24/2020  1:15 PM Alla Feeling, NP [facility] None  07/24/2020  2:00 PM [facility] INFUSION [facility] None    Orders Placed This Encounter  Procedures  . DG Chest 2 View       Subjective:   Patient ID:  Gary [name] is a 86 y.o. (DOB March 17, 1932) male.  Chief Complaint:  Chief Complaint  Patient presents with  . Shortness of Breath  . Fatigue    HPI Gary [name]  is a 86 y.o. male with a diagnosis of a cancer of the pancreas.  The patient continues to be managed by Dr. Benay Spice and is status post cycle 1, day 15 of gemcitabine and Abraxane which was dosed on 06/12/2020.  Patient presents to the clinic today with his son.  He reports increasing fatigue, shortness of breath, dyspnea on exertion, fevers up to 100.8, nausea, labored breathing, and wheezing, labored speech, slow speech, slurred speech, chills, dry mouth, soreness of the mouth, GERD, dark urine, and minimal vomiting.  Patient has had no dizziness, falls, chest pain, or vomiting.   Medications: I have reviewed the patient's current medications.  Allergies:  Allergies  Allergen Reactions  . Gentamycin [Gentamicin Sulfate]     "Inner ears problems after Gent infusion" , also "unsteady mobility"  . Hydrocodone   . Aspirin Other (See Comments)    High doses cause stomach upset  Past Medical History:  Diagnosis Date  . Ascending aortic aneurysm ([place]) 01/13/2014   stable at 4.7 cm diameter since 2009  . Asystole x2, s/p pacemaker 01/13/2014  . Bradycardia    permanent pacemaker 11/22/08 St.Jude  . Cancer ([place])   . Family history of adverse reaction to anesthesia    brother died after anesthesia / nose polyp removal at [facility] (40 years ago)  . HTN (hypertension) 01/13/2014  . Hyperlipidemia   . Hypertension   . Pacemaker - leads 1987, last generator 2009 St. Jude  dual-chamber 01/13/2014   Atrial lead model 433-01, ventricular lead model 431-01 implanted November 1987 Right ventricular lead dysfunction with low impedance and mediocre sensing but satisfactory pacing threshold  . Pacemaker lead malfunction 01/13/2014   Chronic low impedance and poor sensing of the ventricular lead    Past Surgical History:  Procedure Laterality Date  . IR IMAGING GUIDED PORT INSERTION  05/24/2020  . PERMANENT PACEMAKER GENERATOR CHANGE  11/22/08   ST. Jude  . PERMANENT PACEMAKER INSERTION  03/04/97   ST. Jude  . US ECHOCARDIOGRAPHY  05/16/11   AS mild to mod.,TR mild to mod., EF => 55%.    Family History  Problem Relation Age of Onset  . Cancer Mother   . Heart attack Father     Social History   Socioeconomic History  . Marital status: Married    Spouse name: Not on file  . Number of children: Not on file  . Years of education: Not on file  . Highest education level: Not on file  Occupational History  . Not on file  Tobacco Use  . Smoking status: Never Smoker  . Smokeless tobacco: Never Used  Vaping Use  . Vaping Use: Never used  Substance and Sexual Activity  . Alcohol use: Yes    Comment: occas.  . Drug use: No  . Sexual activity: Not on file  Other Topics Concern  . Not on file  Social History Narrative  . Not on file   Social Determinants of Health   Financial Resource Strain:   . Difficulty of Paying Living Expenses:   Food Insecurity:   . Worried About Charity fundraiser in the Last Year:   . Arboriculturist in the Last Year:   Transportation Needs:   . Film/video editor (Medical):   Marland Kitchen Lack of Transportation (Non-Medical):   Physical Activity:   . Days of Exercise per Week:   . Minutes of Exercise per Session:   Stress:   . Feeling of Stress :   Social Connections:   . Frequency of Communication with Friends and Family:   . Frequency of Social Gatherings with Friends and Family:   . Attends Religious Services:   . Active  Member of Clubs or Organizations:   . Attends Archivist Meetings:   Marland Kitchen Marital Status:   Intimate Partner Violence:   . Fear of Current or Ex-Partner:   . Emotionally Abused:   Marland Kitchen Physically Abused:   . Sexually Abused:     Past Medical History, Surgical history, Social history, and Family history were reviewed and updated as appropriate.   Please see review of systems for further details on the patient's review from today.   Review of Systems:  Review of Systems  Constitutional: Positive for appetite change, chills and fever. Negative for diaphoresis.  HENT: Positive for mouth sores. Negative for trouble swallowing.   Respiratory: Positive for shortness of breath and wheezing.  Negative for cough and choking.   Cardiovascular: Negative for chest pain and palpitations.  Gastrointestinal: Positive for nausea. Negative for constipation, diarrhea and vomiting.       GERD  Genitourinary: Negative for decreased urine volume.  Musculoskeletal: Negative for arthralgias, back pain and gait problem.  Skin: Negative for rash and wound.  Neurological: Positive for speech difficulty. Negative for headaches.    Objective:   Physical Exam:  BP 120/79 (BP Location: Left Arm, Patient Position: Sitting)   Pulse 96   Temp 97.9 F (36.6 C) (Temporal)   Resp 18   Ht 5\' 6"  (1.676 m)   Wt 172 lb 3.2 oz (78.1 kg)   SpO2 97%   BMI 27.79 kg/m  ECOG: 1  Physical Exam Constitutional:      General: He is not in acute distress.    Appearance: He is not diaphoretic.  HENT:     Head: Normocephalic and atraumatic.  Eyes:     Comments: Bilateral scleral icterus  Cardiovascular:     Rate and Rhythm: Normal rate and regular rhythm.     Heart sounds: Normal heart sounds. No murmur heard.  No friction rub. No gallop.   Pulmonary:     Effort: Pulmonary effort is normal. No respiratory distress.     Breath sounds: Normal breath sounds. No wheezing or rales.  Musculoskeletal:     Right  lower leg: Edema present.     Left lower leg: Edema present.     Comments: 2+ pitting edema to the knees bilaterally.  Skin:    General: Skin is warm and dry.     Findings: No erythema or rash.  Neurological:     Mental Status: He is alert.  Psychiatric:        Mood and Affect: Mood normal.        Behavior: Behavior normal.     Lab Review:     Component Value Date/Time   NA 134 (L) 06/15/2020 1324   NA 142 03/23/2020 1428   K 4.1 06/15/2020 1324   CL 103 06/15/2020 1324   CO2 20 (L) 06/15/2020 1324   GLUCOSE 150 (H) 06/15/2020 1324   BUN 29 (H) 06/15/2020 1324   BUN 18 03/23/2020 1428   CREATININE 0.95 06/15/2020 1324   CREATININE 0.99 07/10/2016 1043   CALCIUM 8.4 (L) 06/15/2020 1324   PROT 6.6 06/15/2020 1324   ALBUMIN 2.6 (L) 06/15/2020 1324   AST 71 (H) 06/15/2020 1324   ALT 51 (H) 06/15/2020 1324   ALKPHOS 159 (H) 06/15/2020 1324   BILITOT 1.7 (H) 06/15/2020 1324   GFRNONAA >60 06/15/2020 1324   GFRAA >60 06/15/2020 1324       Component Value Date/Time   WBC 16.0 (H) 06/15/2020 1324   WBC 15.2 (H) 05/28/2020 1759   RBC 3.41 (L) 06/15/2020 1324   HGB 10.0 (L) 06/15/2020 1324   HCT 29.5 (L) 06/15/2020 1324   PLT 192 06/15/2020 1324   MCV 86.5 06/15/2020 1324   MCH 29.3 06/15/2020 1324   MCHC 33.9 06/15/2020 1324   RDW 17.1 (H) 06/15/2020 1324   LYMPHSABS 0.3 (L) 06/15/2020 1324   MONOABS 0.1 06/15/2020 1324   EOSABS 0.0 06/15/2020 1324   BASOSABS 0.0 06/15/2020 1324   -------------------------------  Imaging from last 24 hours (if applicable):  Radiology interpretation: DG Chest 2 View  Result Date: 06/15/2020 CLINICAL DATA:  Pancreatic cancer with shortness of breath EXAM: CHEST - 2 VIEW COMPARISON:  May 28, 2020 FINDINGS: There is  a dual chamber right-sided pacemaker in place. There is a left Port-A-Cath in place with stable positioning. There is blunting of the left costophrenic angle which may indicate a small left-sided pleural effusion. There is  a large hiatal hernia. There is no pneumothorax. No large focal infiltrate. The heart size is stable. Aortic calcifications are again noted. IMPRESSION: 1. Probable small left-sided pleural effusion. 2. No focal infiltrate. 3. Large hiatal hernia. Electronically Signed   By: Constance Holster M.D.   On: 06/15/2020 15:49   DG Chest 2 View  Result Date: 05/28/2020 CLINICAL DATA:  Fever.  Active chemotherapy.  Episode of vomiting. EXAM: CHEST - 2 VIEW COMPARISON:  Most recent chest imaging CT 04/12/2020 FINDINGS: Left chest port with tip in the mid SVC. Right-sided pacemaker remains in place. Normal heart size with unchanged mediastinal contours. Retrocardiac hiatal hernia. Aortic atherosclerosis. Streaky bibasilar atelectasis without focal pneumonia. Chronic blunting of the left costophrenic angle is related to prominent epicardial fat pad. No large pleural effusion. No pulmonary edema. No pneumothorax. No acute osseous abnormalities are seen. IMPRESSION: Streaky bibasilar atelectasis. No evidence of pneumonia. Electronically Signed   By: Keith Rake M.D.   On: 05/28/2020 18:38   IR IMAGING GUIDED PORT INSERTION  Result Date: 05/24/2020 INDICATION: 86 year old male with metastatic pancreatic cancer in need of durable venous access for chemotherapy. EXAM: IMPLANTED PORT A CATH PLACEMENT WITH ULTRASOUND AND FLUOROSCOPIC GUIDANCE MEDICATIONS: 2 g Ancef; The antibiotic was administered within an appropriate time interval prior to skin puncture. ANESTHESIA/SEDATION: Versed 1.5 mg IV; Fentanyl 75 mcg IV; Moderate Sedation Time:  21 minutes The patient was continuously monitored during the procedure by the interventional radiology nurse under my direct supervision. FLUOROSCOPY TIME:  0 minutes, 42 seconds (11 mGy) COMPLICATIONS: None immediate. PROCEDURE: The left neck and chest was prepped with chlorhexidine, and draped in the usual sterile fashion using maximum barrier technique (cap and mask, sterile gown,  sterile gloves, large sterile sheet, hand hygiene and cutaneous antiseptic). Local anesthesia was attained by infiltration with 1% lidocaine with epinephrine. Ultrasound demonstrated patency of the left internal jugular vein vein, and this was documented with an image. Under real-time ultrasound guidance, this vein was accessed with a 21 gauge micropuncture needle and image documentation was performed. A small dermatotomy was made at the access site with an 11 scalpel. A 0.018" wire was advanced into the SVC and the access needle exchanged for a 52F micropuncture vascular sheath. The 0.018" wire was then removed and a 0.035" wire advanced into the IVC. An appropriate location for the subcutaneous reservoir was selected below the clavicle and an incision was made through the skin and underlying soft tissues. The subcutaneous tissues were then dissected using a combination of blunt and sharp surgical technique and a pocket was formed. A single lumen power injectable portacatheter was then tunneled through the subcutaneous tissues from the pocket to the dermatotomy and the port reservoir placed within the subcutaneous pocket. The venous access site was then serially dilated and a peel away vascular sheath placed over the wire. The wire was removed and the port catheter advanced into position under fluoroscopic guidance. The catheter tip is positioned in the upper right atrium. This was documented with a spot image. The portacatheter was then tested and found to flush and aspirate well. The port was flushed with saline followed by 100 units/mL heparinized saline. The pocket was then closed in two layers using first subdermal inverted interrupted absorbable sutures followed by a running subcuticular suture. The  epidermis was then sealed with Dermabond. The dermatotomy at the venous access site was also closed with Dermabond. IMPRESSION: Successful placement of a left IJ approach Power Port with ultrasound and fluoroscopic  guidance. The catheter is ready for use. Electronically Signed   By: Jacqulynn Cadet M.D.   On: 05/24/2020 15:09        This patient was seen with Dr. Benay Spice with my treatment plan reviewed with him. He expressed agreement with my medical management of this patient. Mr. Mccarrick was interviewed and examined.  We will obtain a chest x-ray today.  The antiemetic regimen will be adjusted with the next chemotherapy treatment.  Julieanne Manson, MD

## 2020-06-21 ENCOUNTER — Other Ambulatory Visit: Payer: Self-pay

## 2020-06-21 MED ORDER — AMLODIPINE BESYLATE 5 MG PO TABS: 5.0000 mg | ORAL_TABLET | Freq: Every day | ORAL | 0 refills | Status: DC

## 2020-06-21 NOTE — Progress Notes (Signed)
Received several emails from patient's son Gary [name] regarding patient is fatigued and wondering if IVF would help.  I explained the fatigue is most likely coming from treatment.  He is also asking about Dr. Benay Spice completing some forms for long term care insurance.  I explained he may either drop them off or bring them with them at his next appointment.  I then received two phone calls from patient's daughter Gary [name] regarding the patient needing help with ADLs right away because he is so weak.  I have explained that Dr. Benay Spice needs to have a face to face encounter to order assistance because he has Medicare.  He has an upcoming appointment on Monday 7/12.    I spoke with Dr. Benay Spice and he feels he needs to evaluate the patient in person.  I have called Gary [name] and left a voice message regarding coming in tomorrow 7/8 at 10:30 to see Dr. Benay Spice.  I asked that he call back and let me know if he is able to bring him.    Gary [name] called back and agreed to bring him in tomorrow to be seen.

## 2020-06-21 NOTE — Telephone Encounter (Signed)
Rx(s) sent to pharmacy electronically.  

## 2020-06-22 ENCOUNTER — Inpatient Hospital Stay (HOSPITAL_BASED_OUTPATIENT_CLINIC_OR_DEPARTMENT_OTHER): Payer: Medicare Other | Admitting: Oncology

## 2020-06-22 ENCOUNTER — Encounter: Payer: Self-pay | Admitting: General Practice

## 2020-06-22 ENCOUNTER — Telehealth: Payer: Self-pay | Admitting: *Deleted

## 2020-06-22 ENCOUNTER — Other Ambulatory Visit: Payer: Self-pay

## 2020-06-22 VITALS — BP 132/68 | HR 64 | Temp 97.9°F | Resp 17 | Ht 66.0 in

## 2020-06-22 DIAGNOSIS — D72823 Leukemoid reaction: Secondary | ICD-10-CM | POA: Diagnosis not present

## 2020-06-22 DIAGNOSIS — Z452 Encounter for adjustment and management of vascular access device: Secondary | ICD-10-CM | POA: Diagnosis not present

## 2020-06-22 DIAGNOSIS — R0602 Shortness of breath: Secondary | ICD-10-CM | POA: Diagnosis not present

## 2020-06-22 DIAGNOSIS — R110 Nausea: Secondary | ICD-10-CM | POA: Diagnosis not present

## 2020-06-22 DIAGNOSIS — Z5111 Encounter for antineoplastic chemotherapy: Secondary | ICD-10-CM | POA: Diagnosis not present

## 2020-06-22 DIAGNOSIS — C252 Malignant neoplasm of tail of pancreas: Secondary | ICD-10-CM

## 2020-06-22 MED ORDER — PREDNISONE 10 MG PO TABS: 10.0000 mg | ORAL_TABLET | Freq: Every day | ORAL | 1 refills | Status: DC

## 2020-06-22 NOTE — Progress Notes (Signed)
Patient declined to be weighed today. Reports "too weak". Family requesting assistance in home bid--help getting OOB and showered/dressed/toileting in am and assisting getting to bed at night. Needs help with bath, dressing, and all ambulation.

## 2020-06-22 NOTE — Telephone Encounter (Addendum)
Prednisone script sent to pharmacy and message to CSW to contact family regarding process to get an aid in the home twice daily. Son reports that patient has a long term care policy. Faxed orders for wheelchair and shower chair to [facility]

## 2020-06-22 NOTE — Progress Notes (Signed)
[place] CSW Progress Notes  Call to son Estuardo Frisbee at request of Merceda Elks RN.  Son wants help w in home care for father.  Patient has Medicare, does not have Medicaid.  Patient may benefit from skilled home health referral - messaged RN to determine if he could be referred.  Family also wants help arranging twice/day personal care aide.   States patient can eat, but needs help w all other ADLs including toileting, bathing, dressing, transfers.  Lives at home with family support.  Son states that father has long term care insurance which may provide some help paying for personal care services - CSW stressed that these personal care services are not covered under Medicare policy.  Advised that family will need to contact long term care insurance carrier to determine their requirements, wait periods, and any network requirements.  W sons consent on father's behalf, made referral via [place] to [facility] for community navigation help in finding personal care services.  Son requests he be the point of contact w family as he is arranging all care for patient at this time.    Edwyna Shell, LCSW Clinical Social Worker Phone:  [phone] Cell:  [phone]

## 2020-06-22 NOTE — Progress Notes (Addendum)
[place] OFFICE PROGRESS NOTE   Diagnosis: Pancreas cancer  INTERVAL HISTORY:   Gary [name] returns prior to a scheduled visit.  He reports no dyspnea today.  He complains of malaise, anorexia, and persistent leg swelling.  He has nausea.  Compazine helps the nausea.  His family would like to check into home care agencies.  Objective:  Vital signs in last 24 hours:  Blood pressure 132/68, pulse 64, temperature 97.9 F (36.6 C), temperature source Oral, resp. rate 17, height 5\' 6"  (1.676 m), SpO2 100 %.    HEENT: No thrush or ulcers Resp: Lungs clear bilaterally Cardio: Regular rate and rhythm GI: No hepatomegaly, nontender, no mass Vascular: 1+ pitting edema below the knee bilaterally Neuro: Alert and oriented Skin: No rash  Portacath/PICC-without erythema  Lab Results:  Lab Results  Component Value Date   WBC 16.0 (H) 06/15/2020   HGB 10.0 (L) 06/15/2020   HCT 29.5 (L) 06/15/2020   MCV 86.5 06/15/2020   PLT 192 06/15/2020   NEUTROABS 15.1 (H) 06/15/2020    CMP  Lab Results  Component Value Date   NA 134 (L) 06/15/2020   K 4.1 06/15/2020   CL 103 06/15/2020   CO2 20 (L) 06/15/2020   GLUCOSE 150 (H) 06/15/2020   BUN 29 (H) 06/15/2020   CREATININE 0.95 06/15/2020   CALCIUM 8.4 (L) 06/15/2020   PROT 6.6 06/15/2020   ALBUMIN 2.6 (L) 06/15/2020   AST 71 (H) 06/15/2020   ALT 51 (H) 06/15/2020   ALKPHOS 159 (H) 06/15/2020   BILITOT 1.7 (H) 06/15/2020   GFRNONAA >60 06/15/2020   GFRAA >60 06/15/2020     Medications: I have reviewed the patient's current medications.   Assessment/Plan: 1. Pancreas tail lesion, multiple liver lesions   Chest CT 04/12/2020-ascending thoracic aortic aneurysm; hypovascular lesion tail of the pancreas, new compared to the prior examination. Multiple poorly defined hypovascular lesions scattered throughout the liver   CT abdomen/pelvis 05/02/2020-hypoenhancing lesion of the pancreatic tail measuring  approximately 2.4 x 1.9 cm; enlarged portacaval lymph nodes measuring up to 1.9 x 1.4 cm; numerous hypodense lesions throughout the liver, index lesion of the liver dome measuring 1.7 x 1.2 cm.  Ultrasound-guided biopsy of a left liver lesion 05/10/2020-poorly differentiated carcinoma consistent with a pancreatobiliary primary  Markedly elevated CA 19-9   Cycle 1 gemcitabine/Abraxane 05/26/2020  Cycle 2 gemcitabine/Abraxane 06/12/2020 2. Thoracic aortic aneurysm 3. Hypertension 4. Hyperlipidemia 5. History of bradycardia status post permanent pacemaker 6. Mother hadcervicalcancer    Disposition: Gary [name] has metastatic cancer.  He has completed 2 treatments with gemcitabine/Abraxane.  His performance status has declined.  He has persistent anorexia and nausea.  Is unclear whether his symptoms are related to the pancreas cancer or side effects from treatment.  He felt well prior to the second cycle of chemotherapy.  We will asked the Cancer center social worker to contact his son to discuss home care options.  We added prednisone as an appetite stimulant.  The antiemetic regimen will be adjusted with the next cycle of chemotherapy.  Cycle 3 chemotherapy will be delayed until 07/03/2020.  Gary [name] requires assistance for activities of daily living.  He will benefit from a home RN evaluation to assess for side effects of chemotherapy and medication management.  He needs an aide for help with bathing and other activities of daily living.  He has limited ability to ambulate and needs a light weight wheelchair.  He is unable to operate a wheelchair greater  than 36 pounds. Gary [name] will benefit from home physical therapy to help with strength training and safety for ambulation. Gary [name] will contact us in the interim as needed.  Betsy Coder, MD  06/22/2020  11:27 AM

## 2020-06-23 ENCOUNTER — Encounter: Payer: Self-pay | Admitting: *Deleted

## 2020-06-23 ENCOUNTER — Ambulatory Visit: Payer: Medicare Other

## 2020-06-23 ENCOUNTER — Telehealth: Payer: Self-pay | Admitting: Cardiovascular Disease

## 2020-06-23 ENCOUNTER — Other Ambulatory Visit: Payer: Medicare Other

## 2020-06-23 ENCOUNTER — Ambulatory Visit: Payer: Medicare Other | Admitting: Nurse Practitioner

## 2020-06-23 ENCOUNTER — Encounter: Payer: Self-pay | Admitting: Genetic Counselor

## 2020-06-23 ENCOUNTER — Telehealth: Payer: Self-pay | Admitting: Nurse Practitioner

## 2020-06-23 ENCOUNTER — Other Ambulatory Visit: Payer: Self-pay | Admitting: *Deleted

## 2020-06-23 DIAGNOSIS — Z1379 Encounter for other screening for genetic and chromosomal anomalies: Secondary | ICD-10-CM | POA: Insufficient documentation

## 2020-06-23 DIAGNOSIS — C252 Malignant neoplasm of tail of pancreas: Secondary | ICD-10-CM

## 2020-06-23 NOTE — Progress Notes (Signed)
Referral orders for [facility], aid and PT signed by MD and sent to [facility] at suggestion of [place], Edwyna Shell after speaking with family. Fax [phone]

## 2020-06-23 NOTE — Telephone Encounter (Signed)
The patient's son has been made aware. Amlodipine and Simvastatin have been taken off of his medication list.

## 2020-06-23 NOTE — Telephone Encounter (Signed)
Spoke to son-he would like to update Dr. Sallyanne Kuster on patients health and ask a few recommendations.   He states patient was diagnosed with metastatic pancreatic and his health has declined.  He is currently taking chemo and due to the chemo he has had increased swelling in his LE.   His oncologist has recommended stopping the amlodipine and simvastatin and son would like to talk to Dr. Sallyanne Kuster about this.   He had 2 infusions so far and the 2nd one has "wiped him out".   They delayed the 3rd to give him time to recover.    He is wanting to do what is best for the patient and would like Dr. Victorino December recommendations about medications.  Advised would route to MD to review

## 2020-06-23 NOTE — Telephone Encounter (Signed)
Patient states that there has been some significant changes his fathers health. He would like to discuss with Dr. Sallyanne Kuster or his nurse. He is aware that Dr. Sallyanne Kuster is not in today.

## 2020-06-23 NOTE — Telephone Encounter (Signed)
Scheduled appts per 7/8 los. Pt's daughter confirmed appt date and time.

## 2020-06-23 NOTE — Telephone Encounter (Signed)
I agree with stopping the amlodipine and the simvastatin. They are are not essential to his health in the short term or mid term. I am very sorry to hear that he is not doing well. Please send him my best wishes.

## 2020-06-26 ENCOUNTER — Inpatient Hospital Stay: Payer: Medicare Other

## 2020-06-26 ENCOUNTER — Inpatient Hospital Stay: Payer: Medicare Other | Admitting: Nurse Practitioner

## 2020-06-28 DIAGNOSIS — Z950 Presence of cardiac pacemaker: Secondary | ICD-10-CM | POA: Diagnosis not present

## 2020-06-28 DIAGNOSIS — I10 Essential (primary) hypertension: Secondary | ICD-10-CM | POA: Diagnosis not present

## 2020-06-28 DIAGNOSIS — K769 Liver disease, unspecified: Secondary | ICD-10-CM | POA: Diagnosis not present

## 2020-06-28 DIAGNOSIS — C253 Malignant neoplasm of pancreatic duct: Secondary | ICD-10-CM | POA: Diagnosis not present

## 2020-06-28 DIAGNOSIS — I712 Thoracic aortic aneurysm, without rupture: Secondary | ICD-10-CM | POA: Diagnosis not present

## 2020-06-28 DIAGNOSIS — E785 Hyperlipidemia, unspecified: Secondary | ICD-10-CM | POA: Diagnosis not present

## 2020-06-28 DIAGNOSIS — Z9181 History of falling: Secondary | ICD-10-CM | POA: Diagnosis not present

## 2020-06-28 DIAGNOSIS — Z7952 Long term (current) use of systemic steroids: Secondary | ICD-10-CM | POA: Diagnosis not present

## 2020-06-28 DIAGNOSIS — Z7982 Long term (current) use of aspirin: Secondary | ICD-10-CM | POA: Diagnosis not present

## 2020-06-28 DIAGNOSIS — C252 Malignant neoplasm of tail of pancreas: Secondary | ICD-10-CM | POA: Diagnosis not present

## 2020-06-28 NOTE — Progress Notes (Signed)
Pharmacist Chemotherapy Monitoring - Follow Up Assessment    I verify that I have reviewed each item in the below checklist:  . Regimen for the patient is scheduled for the appropriate day and plan matches scheduled date. Marland Kitchen Appropriate non-routine labs are ordered dependent on drug ordered. . If applicable, additional medications reviewed and ordered per protocol based on lifetime cumulative doses and/or treatment regimen.   Plan for follow-up and/or issues identified: No . I-vent associated with next due treatment: No . MD and/or nursing notified: No  Philomena Course 06/28/2020 8:52 AM

## 2020-06-30 DIAGNOSIS — E785 Hyperlipidemia, unspecified: Secondary | ICD-10-CM | POA: Diagnosis not present

## 2020-06-30 DIAGNOSIS — I10 Essential (primary) hypertension: Secondary | ICD-10-CM | POA: Diagnosis not present

## 2020-06-30 DIAGNOSIS — I712 Thoracic aortic aneurysm, without rupture: Secondary | ICD-10-CM | POA: Diagnosis not present

## 2020-06-30 DIAGNOSIS — K769 Liver disease, unspecified: Secondary | ICD-10-CM | POA: Diagnosis not present

## 2020-06-30 DIAGNOSIS — C252 Malignant neoplasm of tail of pancreas: Secondary | ICD-10-CM | POA: Diagnosis not present

## 2020-06-30 DIAGNOSIS — C253 Malignant neoplasm of pancreatic duct: Secondary | ICD-10-CM | POA: Diagnosis not present

## 2020-07-02 ENCOUNTER — Other Ambulatory Visit: Payer: Self-pay | Admitting: Oncology

## 2020-07-03 ENCOUNTER — Other Ambulatory Visit: Payer: Medicare Other

## 2020-07-03 ENCOUNTER — Ambulatory Visit: Payer: Medicare Other | Admitting: Oncology

## 2020-07-03 ENCOUNTER — Ambulatory Visit: Payer: Medicare Other

## 2020-07-04 ENCOUNTER — Inpatient Hospital Stay: Payer: Medicare Other

## 2020-07-04 ENCOUNTER — Other Ambulatory Visit: Payer: Self-pay

## 2020-07-04 ENCOUNTER — Inpatient Hospital Stay (HOSPITAL_BASED_OUTPATIENT_CLINIC_OR_DEPARTMENT_OTHER): Payer: Medicare Other | Admitting: Nurse Practitioner

## 2020-07-04 ENCOUNTER — Encounter: Payer: Self-pay | Admitting: Nurse Practitioner

## 2020-07-04 VITALS — BP 151/81 | HR 71 | Temp 97.5°F | Resp 17 | Ht 66.0 in | Wt 163.0 lb

## 2020-07-04 DIAGNOSIS — Z95828 Presence of other vascular implants and grafts: Secondary | ICD-10-CM

## 2020-07-04 DIAGNOSIS — R0602 Shortness of breath: Secondary | ICD-10-CM | POA: Diagnosis not present

## 2020-07-04 DIAGNOSIS — C252 Malignant neoplasm of tail of pancreas: Secondary | ICD-10-CM | POA: Diagnosis not present

## 2020-07-04 DIAGNOSIS — Z5111 Encounter for antineoplastic chemotherapy: Secondary | ICD-10-CM | POA: Diagnosis not present

## 2020-07-04 DIAGNOSIS — R110 Nausea: Secondary | ICD-10-CM | POA: Diagnosis not present

## 2020-07-04 DIAGNOSIS — Z452 Encounter for adjustment and management of vascular access device: Secondary | ICD-10-CM | POA: Diagnosis not present

## 2020-07-04 DIAGNOSIS — D72823 Leukemoid reaction: Secondary | ICD-10-CM | POA: Diagnosis not present

## 2020-07-04 LAB — CMP (CANCER CENTER ONLY)
ALT: 33 U/L (ref 0–44)
AST: 32 U/L (ref 15–41)
Albumin: 2.8 g/dL — ABNORMAL LOW (ref 3.5–5.0)
Alkaline Phosphatase: 196 U/L — ABNORMAL HIGH (ref 38–126)
Anion gap: 6 (ref 5–15)
BUN: 26 mg/dL — ABNORMAL HIGH (ref 8–23)
CO2: 24 mmol/L (ref 22–32)
Calcium: 9.2 mg/dL (ref 8.9–10.3)
Chloride: 106 mmol/L (ref 98–111)
Creatinine: 0.81 mg/dL (ref 0.61–1.24)
GFR, Est AFR Am: >60 mL/min (ref >60)
GFR, Estimated: >60 mL/min (ref >60)
Glucose, Bld: 113 mg/dL — ABNORMAL HIGH (ref 70–99)
Potassium: 4.4 mmol/L (ref 3.5–5.1)
Sodium: 136 mmol/L (ref 135–145)
Total Bilirubin: 1 mg/dL (ref 0.3–1.2)
Total Protein: 7 g/dL (ref 6.5–8.1)

## 2020-07-04 LAB — CBC WITH DIFFERENTIAL (CANCER CENTER ONLY)
Abs Immature Granulocytes: 0.06 10*3/uL (ref 0.00–0.07)
Basophils Absolute: 0.1 10*3/uL (ref 0.0–0.1)
Basophils Relative: 0 %
Eosinophils Absolute: 0 10*3/uL (ref 0.0–0.5)
Eosinophils Relative: 0 %
HCT: 35.4 % — ABNORMAL LOW (ref 39.0–52.0)
Hemoglobin: 11.5 g/dL — ABNORMAL LOW (ref 13.0–17.0)
Immature Granulocytes: 0 %
Lymphocytes Relative: 5 %
Lymphs Abs: 0.7 10*3/uL (ref 0.7–4.0)
MCH: 29 pg (ref 26.0–34.0)
MCHC: 32.5 g/dL (ref 30.0–36.0)
MCV: 89.2 fL (ref 80.0–100.0)
Monocytes Absolute: 0.9 10*3/uL (ref 0.1–1.0)
Monocytes Relative: 6 %
Neutro Abs: 14.3 10*3/uL — ABNORMAL HIGH (ref 1.7–7.7)
Neutrophils Relative %: 89 %
Platelet Count: 231 10*3/uL (ref 150–400)
RBC: 3.97 MIL/uL — ABNORMAL LOW (ref 4.22–5.81)
RDW: 19.1 % — ABNORMAL HIGH (ref 11.5–15.5)
WBC Count: 16 10*3/uL — ABNORMAL HIGH (ref 4.0–10.5)
nRBC: 0 % (ref 0.0–0.2)

## 2020-07-04 MED ORDER — SODIUM CHLORIDE 0.9 % IV SOLN
Freq: Once | INTRAVENOUS | Status: AC
  Filled 2020-07-04: qty 250

## 2020-07-04 MED ORDER — ONDANSETRON HCL 4 MG/2ML IJ SOLN
8.0000 mg | Freq: Once | INTRAMUSCULAR | Status: AC
  Administered 2020-07-04: 8 mg via INTRAVENOUS

## 2020-07-04 MED ORDER — SODIUM CHLORIDE 0.9 % IV SOLN
800.0000 mg/m2 | Freq: Once | INTRAVENOUS | Status: AC
  Administered 2020-07-04: 1520 mg via INTRAVENOUS
  Filled 2020-07-04: qty 39.98

## 2020-07-04 MED ORDER — SODIUM CHLORIDE 0.9% FLUSH
10.0000 mL | INTRAVENOUS | Status: DC | PRN
  Administered 2020-07-04: 10 mL
  Filled 2020-07-04: qty 10

## 2020-07-04 MED ORDER — ONDANSETRON HCL 4 MG/2ML IJ SOLN
INTRAMUSCULAR | Status: AC
  Filled 2020-07-04: qty 4

## 2020-07-04 MED ORDER — HEPARIN SOD (PORK) LOCK FLUSH 100 UNIT/ML IV SOLN
500.0000 [IU] | Freq: Once | INTRAVENOUS | Status: AC | PRN
  Administered 2020-07-04: 500 [IU]
  Filled 2020-07-04: qty 5

## 2020-07-04 MED ORDER — PACLITAXEL PROTEIN-BOUND CHEMO INJECTION 100 MG
75.0000 mg/m2 | Freq: Once | INTRAVENOUS | Status: AC
  Administered 2020-07-04: 150 mg via INTRAVENOUS
  Filled 2020-07-04: qty 30

## 2020-07-04 MED ORDER — SODIUM CHLORIDE 0.9 % IV SOLN
10.0000 mg | Freq: Once | INTRAVENOUS | Status: AC
  Administered 2020-07-04: 10 mg via INTRAVENOUS
  Filled 2020-07-04: qty 10

## 2020-07-04 NOTE — Patient Instructions (Signed)
[facility] Discharge Instructions for Patients Receiving Chemotherapy  Today you received the following chemotherapy agents: Abraxane/Gemzar.  To help prevent nausea and vomiting after your treatment, we encourage you to take your nausea medication as directed.   If you develop nausea and vomiting that is not controlled by your nausea medication, call the clinic.   BELOW ARE SYMPTOMS THAT SHOULD BE REPORTED IMMEDIATELY:  *FEVER GREATER THAN 100.5 F  *CHILLS WITH OR WITHOUT FEVER  NAUSEA AND VOMITING THAT IS NOT CONTROLLED WITH YOUR NAUSEA MEDICATION  *UNUSUAL SHORTNESS OF BREATH  *UNUSUAL BRUISING OR BLEEDING  TENDERNESS IN MOUTH AND THROAT WITH OR WITHOUT PRESENCE OF ULCERS  *URINARY PROBLEMS  *BOWEL PROBLEMS  UNUSUAL RASH Items with * indicate a potential emergency and should be followed up as soon as possible.  Feel free to call the clinic should you have any questions or concerns. The clinic phone number is [phone].  Please show the CHEMO ALERT CARD at check-in to the Emergency Department and triage nurse.   

## 2020-07-04 NOTE — Progress Notes (Addendum)
  [facility] OFFICE PROGRESS NOTE   Diagnosis: Pancreas cancer  INTERVAL HISTORY:   Mr. Knechtel returns as scheduled.  He has completed 2 cycles of gemcitabine/Abraxane.  Cycle 3 was held on 06/22/2020 due to poor performance status.  He is feeling better.  Appetite and energy level have improved.  No nausea or vomiting.  Last night he had heartburn.  Symptoms resolved after taking Tums.  Bowels are moving.  Objective:  Vital signs in last 24 hours:  Blood pressure (!) 151/81, pulse 71, temperature (!) 97.5 F (36.4 C), temperature source Temporal, resp. rate 17, height 5\' 6"  (1.676 m), weight 163 lb (73.9 kg), SpO2 99 %.    HEENT: No thrush or ulcers. Resp: Lungs clear bilaterally. Cardio: Regular rate and rhythm. GI: Abdomen soft and nontender.  No hepatomegaly. Vascular: Pitting edema at the lower legs/ankles. Port-A-Cath without erythema.  Lab Results:  Lab Results  Component Value Date   WBC 16.0 (H) 07/04/2020   HGB 11.5 (L) 07/04/2020   HCT 35.4 (L) 07/04/2020   MCV 89.2 07/04/2020   PLT 231 07/04/2020   NEUTROABS 14.3 (H) 07/04/2020    Imaging:  No results found.  Medications: I have reviewed the patient's current medications.  Assessment/Plan: 1. Pancreas tail lesion, multiple liver lesions   Chest CT 04/12/2020-ascending thoracic aortic aneurysm; hypovascular lesion tail of the pancreas, new compared to the prior examination. Multiple poorly defined hypovascular lesions scattered throughout the liver   CT abdomen/pelvis 05/02/2020-hypoenhancing lesion of the pancreatic tail measuring approximately 2.4 x 1.9 cm; enlarged portacaval lymph nodes measuring up to 1.9 x 1.4 cm; numerous hypodense lesions throughout the liver, index lesion of the liver dome measuring 1.7 x 1.2 cm.  Ultrasound-guided biopsy of a left liver lesion 05/10/2020-poorly differentiated carcinoma consistent with a pancreatobiliary primary  Markedly elevated CA 19-9  Cycle 1  gemcitabine/Abraxane 05/26/2020  Cycle 2 gemcitabine/Abraxane 06/12/2020  Cycle 3 gemcitabine/Abraxane 07/04/2020 (chemotherapy doses reduced, antiemetic regimen adjusted) 2. Thoracic aortic aneurysm 3. Hypertension 4. Hyperlipidemia 5. History of bradycardia status post permanent pacemaker 6. Mother hadcervicalcancer  Disposition: Mr. Kosik appears stable.  He has completed 2 cycles of gemcitabine/Abraxane.  Treatment was held last week due to nausea/anorexia/poor performance status.  He began a trial of prednisone.  Symptoms have improved.  Plan to proceed with cycle 3 gemcitabine/Abraxane today as scheduled.  The chemotherapy doses and antiemetic regimen have been adjusted.  We reviewed the CBC from today.  Counts adequate to proceed with treatment.  He will return for lab, follow-up, cycle 4 gemcitabine/Abraxane in 2 weeks.  Patient seen with Dr. Benay Spice.    Ned Card ANP/GNP-BC   07/04/2020  2:49 PM  This was a shared visit with Ned Card.  Mr. Amos has an improved performance status today.  He will complete cycle 3 gemcitabine/Abraxane.  We discussed the treatment plan with his son.  Julieanne Manson, MD

## 2020-07-05 DIAGNOSIS — C252 Malignant neoplasm of tail of pancreas: Secondary | ICD-10-CM | POA: Diagnosis not present

## 2020-07-05 DIAGNOSIS — K769 Liver disease, unspecified: Secondary | ICD-10-CM | POA: Diagnosis not present

## 2020-07-05 DIAGNOSIS — I10 Essential (primary) hypertension: Secondary | ICD-10-CM | POA: Diagnosis not present

## 2020-07-05 DIAGNOSIS — I712 Thoracic aortic aneurysm, without rupture: Secondary | ICD-10-CM | POA: Diagnosis not present

## 2020-07-05 DIAGNOSIS — C253 Malignant neoplasm of pancreatic duct: Secondary | ICD-10-CM | POA: Diagnosis not present

## 2020-07-05 DIAGNOSIS — E785 Hyperlipidemia, unspecified: Secondary | ICD-10-CM | POA: Diagnosis not present

## 2020-07-05 NOTE — Progress Notes (Signed)
These results were called to [employer]. A message was left for him. He was told to call if there were questions.  Sandi Mealy, MHS, PA-C

## 2020-07-06 ENCOUNTER — Telehealth: Payer: Self-pay | Admitting: Nurse Practitioner

## 2020-07-06 NOTE — Telephone Encounter (Signed)
Scheduled per 7/20 los. Called and left a msg, mailing appt letter and calendar printout

## 2020-07-07 DIAGNOSIS — C253 Malignant neoplasm of pancreatic duct: Secondary | ICD-10-CM | POA: Diagnosis not present

## 2020-07-07 DIAGNOSIS — I712 Thoracic aortic aneurysm, without rupture: Secondary | ICD-10-CM | POA: Diagnosis not present

## 2020-07-07 DIAGNOSIS — I10 Essential (primary) hypertension: Secondary | ICD-10-CM | POA: Diagnosis not present

## 2020-07-07 DIAGNOSIS — K769 Liver disease, unspecified: Secondary | ICD-10-CM | POA: Diagnosis not present

## 2020-07-07 DIAGNOSIS — E785 Hyperlipidemia, unspecified: Secondary | ICD-10-CM | POA: Diagnosis not present

## 2020-07-07 DIAGNOSIS — C252 Malignant neoplasm of tail of pancreas: Secondary | ICD-10-CM | POA: Diagnosis not present

## 2020-07-11 ENCOUNTER — Inpatient Hospital Stay: Payer: Medicare Other

## 2020-07-11 ENCOUNTER — Inpatient Hospital Stay: Payer: Medicare Other | Admitting: Nurse Practitioner

## 2020-07-12 DIAGNOSIS — K769 Liver disease, unspecified: Secondary | ICD-10-CM | POA: Diagnosis not present

## 2020-07-12 DIAGNOSIS — I10 Essential (primary) hypertension: Secondary | ICD-10-CM | POA: Diagnosis not present

## 2020-07-12 DIAGNOSIS — E785 Hyperlipidemia, unspecified: Secondary | ICD-10-CM | POA: Diagnosis not present

## 2020-07-12 DIAGNOSIS — I712 Thoracic aortic aneurysm, without rupture: Secondary | ICD-10-CM | POA: Diagnosis not present

## 2020-07-12 DIAGNOSIS — C252 Malignant neoplasm of tail of pancreas: Secondary | ICD-10-CM | POA: Diagnosis not present

## 2020-07-12 DIAGNOSIS — C253 Malignant neoplasm of pancreatic duct: Secondary | ICD-10-CM | POA: Diagnosis not present

## 2020-07-13 ENCOUNTER — Encounter: Payer: Self-pay | Admitting: *Deleted

## 2020-07-13 DIAGNOSIS — I712 Thoracic aortic aneurysm, without rupture: Secondary | ICD-10-CM | POA: Diagnosis not present

## 2020-07-13 DIAGNOSIS — C253 Malignant neoplasm of pancreatic duct: Secondary | ICD-10-CM | POA: Diagnosis not present

## 2020-07-13 DIAGNOSIS — K769 Liver disease, unspecified: Secondary | ICD-10-CM | POA: Diagnosis not present

## 2020-07-13 DIAGNOSIS — E785 Hyperlipidemia, unspecified: Secondary | ICD-10-CM | POA: Diagnosis not present

## 2020-07-13 DIAGNOSIS — I10 Essential (primary) hypertension: Secondary | ICD-10-CM | POA: Diagnosis not present

## 2020-07-13 DIAGNOSIS — C252 Malignant neoplasm of tail of pancreas: Secondary | ICD-10-CM | POA: Diagnosis not present

## 2020-07-13 NOTE — Progress Notes (Signed)
Received fax from [facility] that visit on 07/11/20 was missed. His daughter stated they do not need HHA visits at this time.

## 2020-07-15 ENCOUNTER — Other Ambulatory Visit: Payer: Self-pay | Admitting: Oncology

## 2020-07-17 NOTE — Progress Notes (Signed)
[place]   [phone] [phone]   Clinic Follow up Note   Patient Care Team: [name], MD as PCP - General (Family Medicine) Croitoru, Dani Gobble, MD as PCP - Cardiology (Cardiology) Jonnie Finner, RN as Oncology Nurse Navigator Owens Shark, NP as Nurse Practitioner (Hematology and Oncology) Ladell Pier, MD as Consulting Physician (Oncology) 07/18/2020  CHIEF COMPLAINT: Follow-up pancreas cancer  CURRENT THERAPY: Gemcitabine and Abraxane on day 1, 15 q. 28 days  INTERVAL HISTORY: [name] returns for follow-up and treatment as scheduled.  He received dose-reduced gem/Abraxane on 7/20.  He continues prednisone 10 mg daily.  He tolerated last cycle much better, improved appetite and regained energy quicker after only 2 days of weakness after infusion. Did not have fevers. Steroids did worsen his insomnia. He does not feel the nursing component of home care is helpful but he does continue PT.  He is able to be out of bed and functional at home with some assistance. His abdomen feels somewhat bloated, denies pain. He manages constipation with stool softener and prune juice, no n/v or mucositis. Has GERD and mild cough at night, no fever, chills, cough, chest pain. Mild leg edema is stable, no calf pain.   MEDICAL HISTORY:  Past Medical History:  Diagnosis Date  . Ascending aortic aneurysm ([place]) 01/13/2014   stable at 4.7 cm diameter since 2009  . Asystole x2, s/p pacemaker 01/13/2014  . Bradycardia    permanent pacemaker 11/22/08 [employer]Jude  . Cancer ([place])   . Family history of adverse reaction to anesthesia    brother died after anesthesia / nose polyp removal at [facility] (40 years ago)  . HTN (hypertension) 01/13/2014  . Hyperlipidemia   . Hypertension   . Pacemaker - leads 1987, last generator 2009 [employer] Jude dual-chamber 01/13/2014   Atrial lead model 433-01, ventricular lead model 431-01 implanted November 1987 Right ventricular lead  dysfunction with low impedance and mediocre sensing but satisfactory pacing threshold  . Pacemaker lead malfunction 01/13/2014   Chronic low impedance and poor sensing of the ventricular lead    SURGICAL HISTORY: Past Surgical History:  Procedure Laterality Date  . IR IMAGING GUIDED PORT INSERTION  05/24/2020  . PERMANENT PACEMAKER GENERATOR CHANGE  11/22/08   [employer] Jude  . PERMANENT PACEMAKER INSERTION  03/04/97   [employer] Jude  . US ECHOCARDIOGRAPHY  05/16/11   AS mild to mod.,TR mild to mod., EF => 55%.    I have reviewed the social history and family history with the patient and they are unchanged from previous note.  ALLERGIES:  is allergic to gentamycin [gentamicin sulfate], hydrocodone, and aspirin.  MEDICATIONS:  Current Outpatient Medications  Medication Sig Dispense Refill  . aspirin 81 MG tablet Take 81 mg by mouth daily.    Marland Kitchen docusate sodium (COLACE) 100 MG capsule Take 100 mg by mouth daily.    . enalapril (VASOTEC) 20 MG tablet TAKE 1 TABLET(20 MG) BY MOUTH TWICE DAILY 180 tablet 1  . lidocaine-prilocaine (EMLA) cream Apply 1 application topically as directed. Apply 1 hour prior to IV stick and cover with plastic wrap 30 g 2  . metoprolol succinate (TOPROL-XL) 50 MG 24 hr tablet TAKE 1 TABLET(50 MG) BY MOUTH DAILY 90 tablet 2  . omeprazole (PRILOSEC) 20 MG capsule Take 1 capsule (20 mg total) by mouth daily. 90 capsule 3  . ondansetron (ZOFRAN) 8 MG tablet Take 1 tablet (8 mg total) by mouth every 8 (eight) hours as  needed for nausea or vomiting. (Patient not taking: Reported on 05/26/2020) 30 tablet 1  . predniSONE (DELTASONE) 10 MG tablet Take 1 tablet (10 mg total) by mouth daily with breakfast. 30 tablet 1  . prochlorperazine (COMPAZINE) 5 MG tablet Take 1-2 tablets (5-10 mg total) by mouth every 6 (six) hours as needed for nausea or vomiting. 30 tablet 1  . triamcinolone lotion (KENALOG) 0.1 % Apply 1 application topically 3 (three) times daily. 120 mL 2   No current  facility-administered medications for this visit.   Facility-Administered Medications Ordered in Other Visits  Medication Dose Route Frequency Provider Last Rate Last Admin  . 0.9 %  sodium chloride infusion   Intravenous Once Ladell Pier, MD      . dexamethasone (DECADRON) 10 mg in sodium chloride 0.9 % 50 mL IVPB  10 mg Intravenous Once Ladell Pier, MD      . gemcitabine (GEMZAR) 1,520 mg in sodium chloride 0.9 % 250 mL chemo infusion  800 mg/m2 (Treatment Plan Recorded) Intravenous Once Ladell Pier, MD      . heparin lock flush 100 unit/mL  500 Units Intracatheter Once PRN Ladell Pier, MD      . ondansetron [facility]) injection 8 mg  8 mg Intravenous Once Ladell Pier, MD      . PACLitaxel-protein bound (ABRAXANE) chemo infusion 150 mg  75 mg/m2 (Treatment Plan Recorded) Intravenous Once Ladell Pier, MD      . sodium chloride flush (NS) 0.9 % injection 10 mL  10 mL Intracatheter PRN Ladell Pier, MD        PHYSICAL EXAMINATION:  Vitals:   07/18/20 1147  BP: 132/80  Pulse: 71  Resp: 18  Temp: 97.8 F (36.6 C)  SpO2: 99%   Filed Weights   07/18/20 1147  Weight: 161 lb 8 oz (73.3 kg)    GENERAL:alert, no distress and comfortable SKIN: no rash to exposed skin EYES:  sclera clear LUNGS: clear with normal breathing effort HEART: regular rate & rhythm, mild pitting lower extremity edema ABDOMEN:abdomen soft, non-tender and normal bowel sounds. No hepatomegaly  NEURO: alert & oriented x 3 with fluent speech PAC without erythema   LABORATORY DATA:  I have reviewed the data as listed CBC Latest Ref Rng & Units 07/18/2020 07/04/2020 06/15/2020  WBC 4.0 - 10.5 K/uL 6.6 16.0(H) 16.0(H)  Hemoglobin 13.0 - 17.0 g/dL 11.3(L) 11.5(L) 10.0(L)  Hematocrit 39 - 52 % 34.4(L) 35.4(L) 29.5(L)  Platelets 150 - 400 K/uL 266 231 192     CMP Latest Ref Rng & Units 07/18/2020 07/04/2020 06/15/2020  Glucose 70 - 99 mg/dL 101(H) 113(H) 150(H)  BUN 8 - 23 mg/dL 22 26(H)  29(H)  Creatinine 0.61 - 1.24 mg/dL 0.83 0.81 0.95  Sodium 135 - 145 mmol/L 137 136 134(L)  Potassium 3.5 - 5.1 mmol/L 3.9 4.4 4.1  Chloride 98 - 111 mmol/L 105 106 103  CO2 22 - 32 mmol/L 24 24 20(L)  Calcium 8.9 - 10.3 mg/dL 9.1 9.2 8.4(L)  Total Protein 6.5 - 8.1 g/dL 6.9 7.0 6.6  Total Bilirubin 0.3 - 1.2 mg/dL 0.9 1.0 1.7(H)  Alkaline Phos 38 - 126 U/L 156(H) 196(H) 159(H)  AST 15 - 41 U/L 29 32 71(H)  ALT 0 - 44 U/L 26 33 51(H)      RADIOGRAPHIC STUDIES: I have personally reviewed the radiological images as listed and agreed with the findings in the report. No results found.   ASSESSMENT &  PLAN:   Assessment/Plan: 1. Pancreas tail lesion, multiple liver lesions   Chest CT 04/12/2020-ascending thoracic aortic aneurysm; hypovascular lesion tail of the pancreas, new compared to the prior examination. Multiple poorly defined hypovascular lesions scattered throughout the liver   CT abdomen/pelvis 05/02/2020-hypoenhancing lesion of the pancreatic tail measuring approximately 2.4 x 1.9 cm; enlarged portacaval lymph nodes measuring up to 1.9 x 1.4 cm; numerous hypodense lesions throughout the liver, index lesion of the liver dome measuring 1.7 x 1.2 cm.  Ultrasound-guided biopsy of a left liver lesion 05/10/2020-poorly differentiated carcinoma consistent with a pancreatobiliary primary  Markedly elevated CA 19-9  Cycle 1 gemcitabine/Abraxane 05/26/2020  Cycle 2 gemcitabine/Abraxane 06/12/2020  Cycle 3 gemcitabine/Abraxane 07/04/2020 (chemotherapy doses reduced, antiemetic regimen adjusted)  Cycle 4 Gemcitabine/Abraxane 07/18/2020 (continue same dose reduction, reduce post-treatment steroids due to insomnia)  2. Thoracic aortic aneurysm 3. Hypertension 4. Hyperlipidemia 5. History of bradycardia status post permanent pacemaker 6. Mother hadcervicalcancer 7. Pitting lower leg edema 8. GERD, on omeprazole   Disposition: [name] appears stable.  He completed cycle 3  gemcitabine and Abraxane which was dose reduced.  He tolerated this cycle much better with improved appetite and energy.  Due to insomnia, I recommend to reduce post treatment prednisone to 5-10 mg daily for 3-5 days as needed following chemo.   CBC and CMP are stable, adequate for cycle 4 gem/Abraxane today, we will continue the same dose reductions.  We will follow-up on the outstanding CA 19-9 from today.   The patient and family do not wish to continue nursing care with [facility] due to lack of benefit. It is fine to discontinue the nursing component and continue physical therapy at this time.  Follow-up in 2 weeks with cycle 5.  I updated Dr. Benay Spice on the plan.  All questions were answered. The patient knows to call the clinic with any problems, questions or concerns. No barriers to learning were detected.     Alla Feeling, NP 07/18/20

## 2020-07-18 ENCOUNTER — Encounter: Payer: Self-pay | Admitting: Nurse Practitioner

## 2020-07-18 ENCOUNTER — Inpatient Hospital Stay: Payer: Medicare Other

## 2020-07-18 ENCOUNTER — Inpatient Hospital Stay: Payer: Medicare Other | Attending: Oncology

## 2020-07-18 ENCOUNTER — Inpatient Hospital Stay: Payer: Medicare Other | Admitting: Nurse Practitioner

## 2020-07-18 ENCOUNTER — Other Ambulatory Visit: Payer: Self-pay

## 2020-07-18 ENCOUNTER — Inpatient Hospital Stay (HOSPITAL_BASED_OUTPATIENT_CLINIC_OR_DEPARTMENT_OTHER): Payer: Medicare Other | Admitting: Nurse Practitioner

## 2020-07-18 VITALS — BP 132/80 | HR 71 | Temp 97.8°F | Resp 18 | Ht 66.0 in | Wt 161.5 lb

## 2020-07-18 DIAGNOSIS — C252 Malignant neoplasm of tail of pancreas: Secondary | ICD-10-CM

## 2020-07-18 DIAGNOSIS — G4700 Insomnia, unspecified: Secondary | ICD-10-CM | POA: Insufficient documentation

## 2020-07-18 DIAGNOSIS — Z5111 Encounter for antineoplastic chemotherapy: Secondary | ICD-10-CM | POA: Insufficient documentation

## 2020-07-18 DIAGNOSIS — K219 Gastro-esophageal reflux disease without esophagitis: Secondary | ICD-10-CM | POA: Diagnosis not present

## 2020-07-18 DIAGNOSIS — I10 Essential (primary) hypertension: Secondary | ICD-10-CM | POA: Diagnosis not present

## 2020-07-18 DIAGNOSIS — E785 Hyperlipidemia, unspecified: Secondary | ICD-10-CM | POA: Diagnosis not present

## 2020-07-18 DIAGNOSIS — Z95828 Presence of other vascular implants and grafts: Secondary | ICD-10-CM

## 2020-07-18 LAB — CMP (CANCER CENTER ONLY)
ALT: 26 U/L (ref 0–44)
AST: 29 U/L (ref 15–41)
Albumin: 3 g/dL — ABNORMAL LOW (ref 3.5–5.0)
Alkaline Phosphatase: 156 U/L — ABNORMAL HIGH (ref 38–126)
Anion gap: 8 (ref 5–15)
BUN: 22 mg/dL (ref 8–23)
CO2: 24 mmol/L (ref 22–32)
Calcium: 9.1 mg/dL (ref 8.9–10.3)
Chloride: 105 mmol/L (ref 98–111)
Creatinine: 0.83 mg/dL (ref 0.61–1.24)
GFR, Est AFR Am: >60 mL/min (ref >60)
GFR, Estimated: >60 mL/min (ref >60)
Glucose, Bld: 101 mg/dL — ABNORMAL HIGH (ref 70–99)
Potassium: 3.9 mmol/L (ref 3.5–5.1)
Sodium: 137 mmol/L (ref 135–145)
Total Bilirubin: 0.9 mg/dL (ref 0.3–1.2)
Total Protein: 6.9 g/dL (ref 6.5–8.1)

## 2020-07-18 LAB — CBC WITH DIFFERENTIAL (CANCER CENTER ONLY)
Abs Immature Granulocytes: 0.02 10*3/uL (ref 0.00–0.07)
Basophils Absolute: 0 10*3/uL (ref 0.0–0.1)
Basophils Relative: 1 %
Eosinophils Absolute: 0 10*3/uL (ref 0.0–0.5)
Eosinophils Relative: 0 %
HCT: 34.4 % — ABNORMAL LOW (ref 39.0–52.0)
Hemoglobin: 11.3 g/dL — ABNORMAL LOW (ref 13.0–17.0)
Immature Granulocytes: 0 %
Lymphocytes Relative: 7 %
Lymphs Abs: 0.5 10*3/uL — ABNORMAL LOW (ref 0.7–4.0)
MCH: 29.4 pg (ref 26.0–34.0)
MCHC: 32.8 g/dL (ref 30.0–36.0)
MCV: 89.4 fL (ref 80.0–100.0)
Monocytes Absolute: 0.6 10*3/uL (ref 0.1–1.0)
Monocytes Relative: 9 %
Neutro Abs: 5.5 10*3/uL (ref 1.7–7.7)
Neutrophils Relative %: 83 %
Platelet Count: 266 10*3/uL (ref 150–400)
RBC: 3.85 MIL/uL — ABNORMAL LOW (ref 4.22–5.81)
RDW: 18.6 % — ABNORMAL HIGH (ref 11.5–15.5)
WBC Count: 6.6 10*3/uL (ref 4.0–10.5)
nRBC: 0 % (ref 0.0–0.2)

## 2020-07-18 MED ORDER — SODIUM CHLORIDE 0.9 % IV SOLN
10.0000 mg | Freq: Once | INTRAVENOUS | Status: AC
  Administered 2020-07-18: 10 mg via INTRAVENOUS
  Filled 2020-07-18: qty 10

## 2020-07-18 MED ORDER — HEPARIN SOD (PORK) LOCK FLUSH 100 UNIT/ML IV SOLN
500.0000 [IU] | Freq: Once | INTRAVENOUS | Status: AC | PRN
  Administered 2020-07-18: 500 [IU]
  Filled 2020-07-18: qty 5

## 2020-07-18 MED ORDER — PACLITAXEL PROTEIN-BOUND CHEMO INJECTION 100 MG
75.0000 mg/m2 | Freq: Once | INTRAVENOUS | Status: AC
  Administered 2020-07-18: 150 mg via INTRAVENOUS
  Filled 2020-07-18: qty 30

## 2020-07-18 MED ORDER — SODIUM CHLORIDE 0.9% FLUSH
10.0000 mL | INTRAVENOUS | Status: DC | PRN
  Administered 2020-07-18: 10 mL
  Filled 2020-07-18: qty 10

## 2020-07-18 MED ORDER — SODIUM CHLORIDE 0.9 % IV SOLN
Freq: Once | INTRAVENOUS | Status: AC
  Filled 2020-07-18: qty 250

## 2020-07-18 MED ORDER — ONDANSETRON HCL 4 MG/2ML IJ SOLN
8.0000 mg | Freq: Once | INTRAMUSCULAR | Status: AC
  Administered 2020-07-18: 8 mg via INTRAVENOUS

## 2020-07-18 MED ORDER — PROCHLORPERAZINE MALEATE 5 MG PO TABS: 5.0000 mg | ORAL_TABLET | Freq: Four times a day (QID) | ORAL | 1 refills | Status: DC | PRN

## 2020-07-18 MED ORDER — ONDANSETRON HCL 4 MG/2ML IJ SOLN
INTRAMUSCULAR | Status: AC
  Filled 2020-07-18: qty 4

## 2020-07-18 MED ORDER — LIDOCAINE-PRILOCAINE 2.5-2.5 % EX CREA: 1.0000 "application " | TOPICAL_CREAM | CUTANEOUS | 2 refills | Status: DC

## 2020-07-18 MED ORDER — SODIUM CHLORIDE 0.9 % IV SOLN
800.0000 mg/m2 | Freq: Once | INTRAVENOUS | Status: AC
  Administered 2020-07-18: 1520 mg via INTRAVENOUS
  Filled 2020-07-18: qty 39.98

## 2020-07-18 NOTE — Patient Instructions (Signed)
[facility] Discharge Instructions for Patients Receiving Chemotherapy  Today you received the following chemotherapy agents: Abraxane, gemzar  To help prevent nausea and vomiting after your treatment, we encourage you to take your nausea medication as directed.   If you develop nausea and vomiting that is not controlled by your nausea medication, call the clinic.   BELOW ARE SYMPTOMS THAT SHOULD BE REPORTED IMMEDIATELY:  *FEVER GREATER THAN 100.5 F  *CHILLS WITH OR WITHOUT FEVER  NAUSEA AND VOMITING THAT IS NOT CONTROLLED WITH YOUR NAUSEA MEDICATION  *UNUSUAL SHORTNESS OF BREATH  *UNUSUAL BRUISING OR BLEEDING  TENDERNESS IN MOUTH AND THROAT WITH OR WITHOUT PRESENCE OF ULCERS  *URINARY PROBLEMS  *BOWEL PROBLEMS  UNUSUAL RASH Items with * indicate a potential emergency and should be followed up as soon as possible.  Feel free to call the clinic should you have any questions or concerns. The clinic phone number is [phone].  Please show the [facility] at check-in to the Emergency Department and triage nurse.

## 2020-07-18 NOTE — Progress Notes (Signed)
Nutrition Assessment   Reason for Assessment:  Patient identified on Malnutrition Screening report for poor appetite and weight loss  ASSESSMENT:  86 year old male with pancreatic cancer receiving chemotherapy.  Past medical history of HTN, HLD, pacemaker.    Spoke with patient during infusion.  Patient reports appetite is much better (noted steroid prescribed).  Reports that he eats eggs and drinks milk for breakfast. Lunch is chicken soup or potatoes and beef.  Likes lamb, fish.  Drinks ensure shakes 2-3 times per day.  Patient reports chocolate does not taste good to him anymore.  Patient wanted RD to call son, Laverna Peace as well.   Spoke with [employer]. Reports weight loss may have been related to fluctuation between constipation and diarrhea.  Also concerned that weights taken at cancer center may not be very accurate or could be reported weight.  Son reports appetite decreased 3-4 days after treatment.   Medications: colace, prilosec, zofran, compazine   Labs: glucose 113   Anthropometrics:   Height: 66 inches Weight: 161 lb today 172 lb on 7/1 169 lb on 6/25 6/1 167 lb BMI: 26  Patient brought in recordings of daily weight from home 163-165 lb  Estimated Energy Needs  Kcals: 2200-2500 Protein: 110-125 g Fluid: > 2.2 L   NUTRITION DIAGNOSIS: to be determined  INTERVENTION:  Encouraged well balanced diet including protein source at every meal and snack.  Encouraged continued use of protein shakes as tolerated for additional calories and protein.  Encouraged smaller servings of foods, more frequently.   Encouraged home monitoring of weight changes at the same time each day, on same scale, with same amount of clothing on and track trends in weight changes for most accurate weight.  Contact information given to patient and son.  Son, patient and family will talk and let RD know if they would like further follow-up in infusion or call RD if services needed in the future.      Next Visit: available as needed per patient and family's request  Joli B. Zenia Resides, [place], [place] Registered Dietitian [phone] (mobile)

## 2020-07-19 ENCOUNTER — Telehealth: Payer: Self-pay | Admitting: Nurse Practitioner

## 2020-07-19 ENCOUNTER — Other Ambulatory Visit: Payer: Self-pay

## 2020-07-19 ENCOUNTER — Telehealth: Payer: Self-pay | Admitting: *Deleted

## 2020-07-19 MED ORDER — PROCHLORPERAZINE MALEATE 5 MG PO TABS: 5.0000 mg | ORAL_TABLET | Freq: Four times a day (QID) | ORAL | 1 refills | Status: DC | PRN

## 2020-07-19 MED ORDER — PREDNISONE 10 MG PO TABS: 10.0000 mg | ORAL_TABLET | Freq: Every day | ORAL | 1 refills | Status: DC

## 2020-07-19 NOTE — Telephone Encounter (Signed)
Called pt per 8/4 sch msg - no answer. Left message for pt son to call back to reschedule.

## 2020-07-19 NOTE — Telephone Encounter (Signed)
Scheduled per 8/3 los. Spoke with pt's wife. No MD availibilty on 8/17, scheduled on 8/18. Rescheduled appts on 8/31. Pt wife stated appts too early and requested latest appts.

## 2020-07-19 NOTE — Telephone Encounter (Addendum)
Called to inquire if MD still wants to continue weekly HHN visits. Son is requesting to continue, but patient does not see the need. Provided verbal order to continue weekly visit x 2 and will discuss further home health at next visit. She requested order be faxed [phone]. Patient has missed several PT visits due to caregiver saying he is too weak to do therapy. Spoke w/manager at [facility]: Son has requested to discontinue nursing visits. Feels they are not "doing anything" for him. RN sees him and completes an assessment and provides teaching. Son not very receptive to teaching from nurse. She feels their expectations of what home health is for may be appropriate. They were offered another nurse, but declined.

## 2020-07-20 LAB — CANCER ANTIGEN 19-9: CA 19-9: 22403 U/mL — ABNORMAL HIGH (ref 0–35)

## 2020-07-21 DIAGNOSIS — C252 Malignant neoplasm of tail of pancreas: Secondary | ICD-10-CM | POA: Diagnosis not present

## 2020-07-21 DIAGNOSIS — I712 Thoracic aortic aneurysm, without rupture: Secondary | ICD-10-CM | POA: Diagnosis not present

## 2020-07-21 DIAGNOSIS — E785 Hyperlipidemia, unspecified: Secondary | ICD-10-CM | POA: Diagnosis not present

## 2020-07-21 DIAGNOSIS — C253 Malignant neoplasm of pancreatic duct: Secondary | ICD-10-CM | POA: Diagnosis not present

## 2020-07-21 DIAGNOSIS — I10 Essential (primary) hypertension: Secondary | ICD-10-CM | POA: Diagnosis not present

## 2020-07-21 DIAGNOSIS — K769 Liver disease, unspecified: Secondary | ICD-10-CM | POA: Diagnosis not present

## 2020-07-24 ENCOUNTER — Other Ambulatory Visit: Payer: Medicare Other

## 2020-07-24 ENCOUNTER — Ambulatory Visit: Payer: Medicare Other

## 2020-07-24 ENCOUNTER — Ambulatory Visit: Payer: Medicare Other | Admitting: Nurse Practitioner

## 2020-07-24 DIAGNOSIS — I10 Essential (primary) hypertension: Secondary | ICD-10-CM | POA: Diagnosis not present

## 2020-07-24 DIAGNOSIS — C252 Malignant neoplasm of tail of pancreas: Secondary | ICD-10-CM | POA: Diagnosis not present

## 2020-07-24 DIAGNOSIS — K769 Liver disease, unspecified: Secondary | ICD-10-CM | POA: Diagnosis not present

## 2020-07-24 DIAGNOSIS — I712 Thoracic aortic aneurysm, without rupture: Secondary | ICD-10-CM | POA: Diagnosis not present

## 2020-07-24 DIAGNOSIS — C253 Malignant neoplasm of pancreatic duct: Secondary | ICD-10-CM | POA: Diagnosis not present

## 2020-07-24 DIAGNOSIS — E785 Hyperlipidemia, unspecified: Secondary | ICD-10-CM | POA: Diagnosis not present

## 2020-07-25 ENCOUNTER — Other Ambulatory Visit: Payer: Medicare Other

## 2020-07-25 ENCOUNTER — Ambulatory Visit: Payer: Medicare Other

## 2020-07-25 ENCOUNTER — Ambulatory Visit: Payer: Medicare Other | Admitting: Oncology

## 2020-07-26 DIAGNOSIS — K769 Liver disease, unspecified: Secondary | ICD-10-CM | POA: Diagnosis not present

## 2020-07-26 DIAGNOSIS — I10 Essential (primary) hypertension: Secondary | ICD-10-CM | POA: Diagnosis not present

## 2020-07-26 DIAGNOSIS — C252 Malignant neoplasm of tail of pancreas: Secondary | ICD-10-CM | POA: Diagnosis not present

## 2020-07-26 DIAGNOSIS — E785 Hyperlipidemia, unspecified: Secondary | ICD-10-CM | POA: Diagnosis not present

## 2020-07-26 DIAGNOSIS — C253 Malignant neoplasm of pancreatic duct: Secondary | ICD-10-CM | POA: Diagnosis not present

## 2020-07-26 DIAGNOSIS — I712 Thoracic aortic aneurysm, without rupture: Secondary | ICD-10-CM | POA: Diagnosis not present

## 2020-07-28 DIAGNOSIS — Z7982 Long term (current) use of aspirin: Secondary | ICD-10-CM | POA: Diagnosis not present

## 2020-07-28 DIAGNOSIS — Z950 Presence of cardiac pacemaker: Secondary | ICD-10-CM | POA: Diagnosis not present

## 2020-07-28 DIAGNOSIS — I10 Essential (primary) hypertension: Secondary | ICD-10-CM | POA: Diagnosis not present

## 2020-07-28 DIAGNOSIS — Z9181 History of falling: Secondary | ICD-10-CM | POA: Diagnosis not present

## 2020-07-28 DIAGNOSIS — K769 Liver disease, unspecified: Secondary | ICD-10-CM | POA: Diagnosis not present

## 2020-07-28 DIAGNOSIS — I712 Thoracic aortic aneurysm, without rupture: Secondary | ICD-10-CM | POA: Diagnosis not present

## 2020-07-28 DIAGNOSIS — C253 Malignant neoplasm of pancreatic duct: Secondary | ICD-10-CM | POA: Diagnosis not present

## 2020-07-28 DIAGNOSIS — Z7952 Long term (current) use of systemic steroids: Secondary | ICD-10-CM | POA: Diagnosis not present

## 2020-07-28 DIAGNOSIS — C252 Malignant neoplasm of tail of pancreas: Secondary | ICD-10-CM | POA: Diagnosis not present

## 2020-07-28 DIAGNOSIS — E785 Hyperlipidemia, unspecified: Secondary | ICD-10-CM | POA: Diagnosis not present

## 2020-07-29 ENCOUNTER — Other Ambulatory Visit: Payer: Self-pay | Admitting: Oncology

## 2020-07-31 DIAGNOSIS — E785 Hyperlipidemia, unspecified: Secondary | ICD-10-CM | POA: Diagnosis not present

## 2020-07-31 DIAGNOSIS — I10 Essential (primary) hypertension: Secondary | ICD-10-CM | POA: Diagnosis not present

## 2020-07-31 DIAGNOSIS — C252 Malignant neoplasm of tail of pancreas: Secondary | ICD-10-CM | POA: Diagnosis not present

## 2020-07-31 DIAGNOSIS — C253 Malignant neoplasm of pancreatic duct: Secondary | ICD-10-CM | POA: Diagnosis not present

## 2020-07-31 DIAGNOSIS — I712 Thoracic aortic aneurysm, without rupture: Secondary | ICD-10-CM | POA: Diagnosis not present

## 2020-07-31 DIAGNOSIS — K769 Liver disease, unspecified: Secondary | ICD-10-CM | POA: Diagnosis not present

## 2020-08-02 ENCOUNTER — Inpatient Hospital Stay (HOSPITAL_BASED_OUTPATIENT_CLINIC_OR_DEPARTMENT_OTHER): Payer: Medicare Other | Admitting: Nurse Practitioner

## 2020-08-02 ENCOUNTER — Other Ambulatory Visit: Payer: Self-pay

## 2020-08-02 ENCOUNTER — Inpatient Hospital Stay: Payer: Medicare Other

## 2020-08-02 ENCOUNTER — Encounter: Payer: Self-pay | Admitting: Nurse Practitioner

## 2020-08-02 VITALS — BP 144/89 | HR 70 | Temp 97.8°F | Resp 18 | Ht 66.0 in | Wt 161.2 lb

## 2020-08-02 DIAGNOSIS — I10 Essential (primary) hypertension: Secondary | ICD-10-CM | POA: Diagnosis not present

## 2020-08-02 DIAGNOSIS — Z5111 Encounter for antineoplastic chemotherapy: Secondary | ICD-10-CM | POA: Diagnosis not present

## 2020-08-02 DIAGNOSIS — C252 Malignant neoplasm of tail of pancreas: Secondary | ICD-10-CM | POA: Diagnosis not present

## 2020-08-02 DIAGNOSIS — K219 Gastro-esophageal reflux disease without esophagitis: Secondary | ICD-10-CM | POA: Diagnosis not present

## 2020-08-02 DIAGNOSIS — G4700 Insomnia, unspecified: Secondary | ICD-10-CM | POA: Diagnosis not present

## 2020-08-02 DIAGNOSIS — E785 Hyperlipidemia, unspecified: Secondary | ICD-10-CM | POA: Diagnosis not present

## 2020-08-02 LAB — CBC WITH DIFFERENTIAL (CANCER CENTER ONLY)
Abs Immature Granulocytes: 0.04 10*3/uL (ref 0.00–0.07)
Basophils Absolute: 0 10*3/uL (ref 0.0–0.1)
Basophils Relative: 0 %
Eosinophils Absolute: 0 10*3/uL (ref 0.0–0.5)
Eosinophils Relative: 0 %
HCT: 35.1 % — ABNORMAL LOW (ref 39.0–52.0)
Hemoglobin: 11.3 g/dL — ABNORMAL LOW (ref 13.0–17.0)
Immature Granulocytes: 1 %
Lymphocytes Relative: 10 %
Lymphs Abs: 0.8 10*3/uL (ref 0.7–4.0)
MCH: 28.7 pg (ref 26.0–34.0)
MCHC: 32.2 g/dL (ref 30.0–36.0)
MCV: 89.1 fL (ref 80.0–100.0)
Monocytes Absolute: 1 10*3/uL (ref 0.1–1.0)
Monocytes Relative: 12 %
Neutro Abs: 6.3 10*3/uL (ref 1.7–7.7)
Neutrophils Relative %: 77 %
Platelet Count: 233 10*3/uL (ref 150–400)
RBC: 3.94 MIL/uL — ABNORMAL LOW (ref 4.22–5.81)
RDW: 18.9 % — ABNORMAL HIGH (ref 11.5–15.5)
WBC Count: 8.1 10*3/uL (ref 4.0–10.5)
nRBC: 0 % (ref 0.0–0.2)

## 2020-08-02 LAB — CMP (CANCER CENTER ONLY)
ALT: 31 U/L (ref 0–44)
AST: 31 U/L (ref 15–41)
Albumin: 2.9 g/dL — ABNORMAL LOW (ref 3.5–5.0)
Alkaline Phosphatase: 177 U/L — ABNORMAL HIGH (ref 38–126)
Anion gap: 7 (ref 5–15)
BUN: 21 mg/dL (ref 8–23)
CO2: 25 mmol/L (ref 22–32)
Calcium: 9.4 mg/dL (ref 8.9–10.3)
Chloride: 104 mmol/L (ref 98–111)
Creatinine: 0.85 mg/dL (ref 0.61–1.24)
GFR, Est AFR Am: >60 mL/min (ref >60)
GFR, Estimated: >60 mL/min (ref >60)
Glucose, Bld: 102 mg/dL — ABNORMAL HIGH (ref 70–99)
Potassium: 4 mmol/L (ref 3.5–5.1)
Sodium: 136 mmol/L (ref 135–145)
Total Bilirubin: 1 mg/dL (ref 0.3–1.2)
Total Protein: 6.8 g/dL (ref 6.5–8.1)

## 2020-08-02 MED ORDER — SODIUM CHLORIDE 0.9 % IV SOLN
Freq: Once | INTRAVENOUS | Status: AC
  Filled 2020-08-02: qty 250

## 2020-08-02 MED ORDER — SODIUM CHLORIDE 0.9 % IV SOLN
800.0000 mg/m2 | Freq: Once | INTRAVENOUS | Status: AC
  Administered 2020-08-02: 1520 mg via INTRAVENOUS
  Filled 2020-08-02: qty 39.98

## 2020-08-02 MED ORDER — ONDANSETRON HCL 4 MG/2ML IJ SOLN
INTRAMUSCULAR | Status: AC
  Filled 2020-08-02: qty 4

## 2020-08-02 MED ORDER — ONDANSETRON HCL 4 MG/2ML IJ SOLN
8.0000 mg | Freq: Once | INTRAMUSCULAR | Status: AC
  Administered 2020-08-02: 8 mg via INTRAVENOUS

## 2020-08-02 MED ORDER — HEPARIN SOD (PORK) LOCK FLUSH 100 UNIT/ML IV SOLN
500.0000 [IU] | Freq: Once | INTRAVENOUS | Status: AC | PRN
  Administered 2020-08-02: 500 [IU]
  Filled 2020-08-02: qty 5

## 2020-08-02 MED ORDER — SODIUM CHLORIDE 0.9% FLUSH
10.0000 mL | INTRAVENOUS | Status: DC | PRN
  Administered 2020-08-02: 10 mL
  Filled 2020-08-02: qty 10

## 2020-08-02 MED ORDER — SODIUM CHLORIDE 0.9 % IV SOLN
10.0000 mg | Freq: Once | INTRAVENOUS | Status: AC
  Administered 2020-08-02: 10 mg via INTRAVENOUS
  Filled 2020-08-02: qty 10

## 2020-08-02 MED ORDER — PACLITAXEL PROTEIN-BOUND CHEMO INJECTION 100 MG
75.0000 mg/m2 | Freq: Once | INTRAVENOUS | Status: AC
  Administered 2020-08-02: 150 mg via INTRAVENOUS
  Filled 2020-08-02: qty 30

## 2020-08-02 NOTE — Patient Instructions (Signed)
[facility] Discharge Instructions for Patients Receiving Chemotherapy  Today you received the following chemotherapy agents abraxane, gemzar  To help prevent nausea and vomiting after your treatment, we encourage you to take your nausea medication as directed.   If you develop nausea and vomiting that is not controlled by your nausea medication, call the clinic.   BELOW ARE SYMPTOMS THAT SHOULD BE REPORTED IMMEDIATELY:  *FEVER GREATER THAN 100.5 F  *CHILLS WITH OR WITHOUT FEVER  NAUSEA AND VOMITING THAT IS NOT CONTROLLED WITH YOUR NAUSEA MEDICATION  *UNUSUAL SHORTNESS OF BREATH  *UNUSUAL BRUISING OR BLEEDING  TENDERNESS IN MOUTH AND THROAT WITH OR WITHOUT PRESENCE OF ULCERS  *URINARY PROBLEMS  *BOWEL PROBLEMS  UNUSUAL RASH Items with * indicate a potential emergency and should be followed up as soon as possible.  Feel free to call the clinic should you have any questions or concerns. The clinic phone number is [phone].  Please show the CHEMO ALERT CARD at check-in to the Emergency Department and triage nurse.   

## 2020-08-02 NOTE — Progress Notes (Signed)
[place]   Telephone:(336) [phone] Fax:(336) [phone]   Clinic Follow up Note   Patient Care Team: [name], MD as PCP - General (Family Medicine) Croitoru, Dani Gobble, MD as PCP - Cardiology (Cardiology) Jonnie Finner, RN as Oncology Nurse Navigator Owens Shark, NP as Nurse Practitioner (Hematology and Oncology) Ladell Pier, MD as Consulting Physician (Oncology) 08/02/2020  CHIEF COMPLAINT: F/u pancreas cancer   CURRENT THERAPY: Gemcitabine and Abraxane on days 1 and 15 q28 days  INTERVAL HISTORY: [name] returns for f/u and treatment as scheduled. He completed cycle 2 day 15 on 07/18/20.  He is doing well.  He tried to reduce prednisone to 5 mg after chemo for 4 to 5 days but noticed low energy and decreased appetite so he increased back to 10 mg daily.  This helps him remain active and eat and drink well.  He still has insomnia but not every night.  He continues PT, walked outside yesterday ambulates with a cane at home or walker if he is more tired.  Leg swelling is stable.  His son reports his speech is slower and occasionally seems "spacey," patient notes he sometimes needs clarification when people speak to him.  He attributes this to wearing a mask and malfunctioning hearing aids.  Denies weakness, dizziness, fall.  Denies fever, chills, cough, chest pain, dyspnea, mucositis, rash, neuropathy, n/v/c/d or abdominal pain.   MEDICAL HISTORY:  Past Medical History:  Diagnosis Date  . Ascending aortic aneurysm ([place]) 01/13/2014   stable at 4.7 cm diameter since 2009  . Asystole x2, s/p pacemaker 01/13/2014  . Bradycardia    permanent pacemaker 11/22/08 St.Jude  . Cancer ([place])   . Family history of adverse reaction to anesthesia    brother died after anesthesia / nose polyp removal at [facility] (40 years ago)  . HTN (hypertension) 01/13/2014  . Hyperlipidemia   . Hypertension   . Pacemaker - leads 1987, last generator 2009 St. Jude dual-chamber 01/13/2014    Atrial lead model 433-01, ventricular lead model 431-01 implanted November 1987 Right ventricular lead dysfunction with low impedance and mediocre sensing but satisfactory pacing threshold  . Pacemaker lead malfunction 01/13/2014   Chronic low impedance and poor sensing of the ventricular lead    SURGICAL HISTORY: Past Surgical History:  Procedure Laterality Date  . IR IMAGING GUIDED PORT INSERTION  05/24/2020  . PERMANENT PACEMAKER GENERATOR CHANGE  11/22/08   ST. Jude  . PERMANENT PACEMAKER INSERTION  03/04/97   ST. Jude  . US ECHOCARDIOGRAPHY  05/16/11   AS mild to mod.,TR mild to mod., EF => 55%.    I have reviewed the social history and family history with the patient and they are unchanged from previous note.  ALLERGIES:  is allergic to gentamycin [gentamicin sulfate], hydrocodone, and aspirin.  MEDICATIONS:  Current Outpatient Medications  Medication Sig Dispense Refill  . aspirin 81 MG tablet Take 81 mg by mouth daily.    Marland Kitchen docusate sodium (COLACE) 100 MG capsule Take 100 mg by mouth daily.    . enalapril (VASOTEC) 20 MG tablet TAKE 1 TABLET(20 MG) BY MOUTH TWICE DAILY 180 tablet 1  . lidocaine-prilocaine (EMLA) cream Apply 1 application topically as directed. Apply 1 hour prior to IV stick and cover with plastic wrap 30 g 2  . metoprolol succinate (TOPROL-XL) 50 MG 24 hr tablet TAKE 1 TABLET(50 MG) BY MOUTH DAILY 90 tablet 2  . omeprazole (PRILOSEC) 20 MG capsule Take 1 capsule (20 mg  total) by mouth daily. 90 capsule 3  . predniSONE (DELTASONE) 10 MG tablet Take 1 tablet (10 mg total) by mouth daily with breakfast. 30 tablet 1  . prochlorperazine (COMPAZINE) 5 MG tablet Take 1-2 tablets (5-10 mg total) by mouth every 6 (six) hours as needed for nausea or vomiting. 30 tablet 1  . triamcinolone lotion (KENALOG) 0.1 % Apply 1 application topically 3 (three) times daily. 120 mL 2  . ondansetron (ZOFRAN) 8 MG tablet Take 1 tablet (8 mg total) by mouth every 8 (eight) hours as needed  for nausea or vomiting. (Patient not taking: Reported on 05/26/2020) 30 tablet 1   No current facility-administered medications for this visit.   Facility-Administered Medications Ordered in Other Visits  Medication Dose Route Frequency Provider Last Rate Last Admin  . sodium chloride flush (NS) 0.9 % injection 10 mL  10 mL Intracatheter PRN Ladell Pier, MD   10 mL at 08/02/20 1455    PHYSICAL EXAMINATION:  Vitals:   08/02/20 1114  BP: (!) 144/89  Pulse: 70  Resp: 18  Temp: 97.8 F (36.6 C)  SpO2: 97%   Filed Weights   08/02/20 1114  Weight: 161 lb 3.2 oz (73.1 kg)    GENERAL:alert, no distress and comfortable SKIN: No rash to exposed skin EYES: sclera clear LUNGS: clear with normal breathing effort HEART: regular rate & rhythm, equal bilateral lower extremity edema  ABDOMEN:abdomen soft, non-tender and normal bowel sounds.  Exam performed in wheelchair NEURO: alert & oriented x 3 with fluent speech PAC without erythema  LABORATORY DATA:  I have reviewed the data as listed CBC Latest Ref Rng & Units 08/02/2020 07/18/2020 07/04/2020  WBC 4.0 - 10.5 K/uL 8.1 6.6 16.0(H)  Hemoglobin 13.0 - 17.0 g/dL 11.3(L) 11.3(L) 11.5(L)  Hematocrit 39 - 52 % 35.1(L) 34.4(L) 35.4(L)  Platelets 150 - 400 K/uL 233 266 231     CMP Latest Ref Rng & Units 08/02/2020 07/18/2020 07/04/2020  Glucose 70 - 99 mg/dL 102(H) 101(H) 113(H)  BUN 8 - 23 mg/dL 21 22 26(H)  Creatinine 0.61 - 1.24 mg/dL 0.85 0.83 0.81  Sodium 135 - 145 mmol/L 136 137 136  Potassium 3.5 - 5.1 mmol/L 4.0 3.9 4.4  Chloride 98 - 111 mmol/L 104 105 106  CO2 22 - 32 mmol/L 25 24 24   Calcium 8.9 - 10.3 mg/dL 9.4 9.1 9.2  Total Protein 6.5 - 8.1 g/dL 6.8 6.9 7.0  Total Bilirubin 0.3 - 1.2 mg/dL 1.0 0.9 1.0  Alkaline Phos 38 - 126 U/L 177(H) 156(H) 196(H)  AST 15 - 41 U/L 31 29 32  ALT 0 - 44 U/L 31 26 33      RADIOGRAPHIC STUDIES: I have personally reviewed the radiological images as listed and agreed with the findings  in the report. No results found.   ASSESSMENT & PLAN:   Assessment/Plan: 1. Pancreas tail lesion, multiple liver lesions   Chest CT 04/12/2020-ascending thoracic aortic aneurysm; hypovascular lesion tail of the pancreas, new compared to the prior examination. Multiple poorly defined hypovascular lesions scattered throughout the liver   CT abdomen/pelvis 05/02/2020-hypoenhancing lesion of the pancreatic tail measuring approximately 2.4 x 1.9 cm; enlarged portacaval lymph nodes measuring up to 1.9 x 1.4 cm; numerous hypodense lesions throughout the liver, index lesion of the liver dome measuring 1.7 x 1.2 cm.  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 Thoracic aortic aneurysm 3. Hypertension 4. Hyperlipidemia 5. History of bradycardia status post permanent pacemaker 6. Mother hadcervicalcancer 7. Pitting lower leg edema 8. GERD, on omeprazole   Disposition: Mr. Mariane Baumgarten appears stable.  He completed 2 cycles of dose-reduced gemcitabine and Abraxane. He tolerates treatment well overall.  He continues steroids to improve his energy and appetite.    The CBC and CMP are adequate for treatment. The CA 19-9 has trended down over the past 2 months, we will follow up on the level from today.  He will proceed with cycle 3-day 1 gemcitabine and Abraxane today.  He will return for follow-up and cycle 3-day 15 chemo in 2 weeks.  We plan to restage after he completes cycle 3.  The plan was reviewed  with Dr. Benay Spice.   Orders Placed This Encounter  Procedures  . CBC with Differential (Cancer Center Only)    Standing Status:   Future    Standing Expiration Date:   08/02/2021  . CMP ([facility] only)    Standing Status:   Future    Standing Expiration Date:   08/02/2021   All questions were answered. The patient knows to call the clinic with any problems, questions or concerns. No barriers to learning were detected.     Alla Feeling, NP 08/02/20

## 2020-08-03 ENCOUNTER — Telehealth: Payer: Self-pay | Admitting: Nurse Practitioner

## 2020-08-03 LAB — CANCER ANTIGEN 19-9: CA 19-9: 26376 U/mL — ABNORMAL HIGH (ref 0–35)

## 2020-08-03 NOTE — Telephone Encounter (Signed)
Scheduled per 8/18 los. Spoke with pt's wife and is aware of appt on 8/31 and 9/15.

## 2020-08-07 DIAGNOSIS — I712 Thoracic aortic aneurysm, without rupture: Secondary | ICD-10-CM | POA: Diagnosis not present

## 2020-08-07 DIAGNOSIS — E785 Hyperlipidemia, unspecified: Secondary | ICD-10-CM | POA: Diagnosis not present

## 2020-08-07 DIAGNOSIS — C253 Malignant neoplasm of pancreatic duct: Secondary | ICD-10-CM | POA: Diagnosis not present

## 2020-08-07 DIAGNOSIS — I10 Essential (primary) hypertension: Secondary | ICD-10-CM | POA: Diagnosis not present

## 2020-08-07 DIAGNOSIS — K769 Liver disease, unspecified: Secondary | ICD-10-CM | POA: Diagnosis not present

## 2020-08-07 DIAGNOSIS — C252 Malignant neoplasm of tail of pancreas: Secondary | ICD-10-CM | POA: Diagnosis not present

## 2020-08-09 DIAGNOSIS — Z23 Encounter for immunization: Secondary | ICD-10-CM | POA: Diagnosis not present

## 2020-08-10 DIAGNOSIS — K769 Liver disease, unspecified: Secondary | ICD-10-CM | POA: Diagnosis not present

## 2020-08-10 DIAGNOSIS — I10 Essential (primary) hypertension: Secondary | ICD-10-CM | POA: Diagnosis not present

## 2020-08-10 DIAGNOSIS — C253 Malignant neoplasm of pancreatic duct: Secondary | ICD-10-CM | POA: Diagnosis not present

## 2020-08-10 DIAGNOSIS — I712 Thoracic aortic aneurysm, without rupture: Secondary | ICD-10-CM | POA: Diagnosis not present

## 2020-08-10 DIAGNOSIS — E785 Hyperlipidemia, unspecified: Secondary | ICD-10-CM | POA: Diagnosis not present

## 2020-08-10 DIAGNOSIS — C252 Malignant neoplasm of tail of pancreas: Secondary | ICD-10-CM | POA: Diagnosis not present

## 2020-08-13 ENCOUNTER — Other Ambulatory Visit: Payer: Self-pay | Admitting: Oncology

## 2020-08-14 DIAGNOSIS — C253 Malignant neoplasm of pancreatic duct: Secondary | ICD-10-CM | POA: Diagnosis not present

## 2020-08-14 DIAGNOSIS — I712 Thoracic aortic aneurysm, without rupture: Secondary | ICD-10-CM | POA: Diagnosis not present

## 2020-08-14 DIAGNOSIS — K769 Liver disease, unspecified: Secondary | ICD-10-CM | POA: Diagnosis not present

## 2020-08-14 DIAGNOSIS — C252 Malignant neoplasm of tail of pancreas: Secondary | ICD-10-CM | POA: Diagnosis not present

## 2020-08-14 DIAGNOSIS — I10 Essential (primary) hypertension: Secondary | ICD-10-CM | POA: Diagnosis not present

## 2020-08-14 DIAGNOSIS — E785 Hyperlipidemia, unspecified: Secondary | ICD-10-CM | POA: Diagnosis not present

## 2020-08-15 ENCOUNTER — Other Ambulatory Visit: Payer: Medicare Other

## 2020-08-15 ENCOUNTER — Inpatient Hospital Stay: Payer: Medicare Other

## 2020-08-15 ENCOUNTER — Inpatient Hospital Stay (HOSPITAL_BASED_OUTPATIENT_CLINIC_OR_DEPARTMENT_OTHER): Payer: Medicare Other | Admitting: Nurse Practitioner

## 2020-08-15 ENCOUNTER — Ambulatory Visit: Payer: Medicare Other | Admitting: Oncology

## 2020-08-15 ENCOUNTER — Encounter: Payer: Self-pay | Admitting: Nurse Practitioner

## 2020-08-15 ENCOUNTER — Ambulatory Visit: Payer: Medicare Other

## 2020-08-15 ENCOUNTER — Other Ambulatory Visit: Payer: Self-pay

## 2020-08-15 VITALS — BP 139/74 | HR 72 | Temp 97.7°F | Resp 18 | Ht 66.0 in | Wt 164.5 lb

## 2020-08-15 DIAGNOSIS — C252 Malignant neoplasm of tail of pancreas: Secondary | ICD-10-CM

## 2020-08-15 DIAGNOSIS — Z5111 Encounter for antineoplastic chemotherapy: Secondary | ICD-10-CM | POA: Diagnosis not present

## 2020-08-15 DIAGNOSIS — K219 Gastro-esophageal reflux disease without esophagitis: Secondary | ICD-10-CM | POA: Diagnosis not present

## 2020-08-15 DIAGNOSIS — I10 Essential (primary) hypertension: Secondary | ICD-10-CM | POA: Diagnosis not present

## 2020-08-15 DIAGNOSIS — Z95828 Presence of other vascular implants and grafts: Secondary | ICD-10-CM

## 2020-08-15 DIAGNOSIS — E785 Hyperlipidemia, unspecified: Secondary | ICD-10-CM | POA: Diagnosis not present

## 2020-08-15 DIAGNOSIS — G4700 Insomnia, unspecified: Secondary | ICD-10-CM | POA: Diagnosis not present

## 2020-08-15 LAB — CBC WITH DIFFERENTIAL (CANCER CENTER ONLY)
Abs Immature Granulocytes: 0.04 10*3/uL (ref 0.00–0.07)
Basophils Absolute: 0 10*3/uL (ref 0.0–0.1)
Basophils Relative: 0 %
Eosinophils Absolute: 0 10*3/uL (ref 0.0–0.5)
Eosinophils Relative: 1 %
HCT: 34.5 % — ABNORMAL LOW (ref 39.0–52.0)
Hemoglobin: 11 g/dL — ABNORMAL LOW (ref 13.0–17.0)
Immature Granulocytes: 1 %
Lymphocytes Relative: 10 %
Lymphs Abs: 0.6 10*3/uL — ABNORMAL LOW (ref 0.7–4.0)
MCH: 28 pg (ref 26.0–34.0)
MCHC: 31.9 g/dL (ref 30.0–36.0)
MCV: 87.8 fL (ref 80.0–100.0)
Monocytes Absolute: 0.6 10*3/uL (ref 0.1–1.0)
Monocytes Relative: 10 %
Neutro Abs: 4.8 10*3/uL (ref 1.7–7.7)
Neutrophils Relative %: 78 %
Platelet Count: 197 10*3/uL (ref 150–400)
RBC: 3.93 MIL/uL — ABNORMAL LOW (ref 4.22–5.81)
RDW: 18.2 % — ABNORMAL HIGH (ref 11.5–15.5)
WBC Count: 6.1 10*3/uL (ref 4.0–10.5)
nRBC: 0 % (ref 0.0–0.2)

## 2020-08-15 LAB — CMP (CANCER CENTER ONLY)
ALT: 28 U/L (ref 0–44)
AST: 30 U/L (ref 15–41)
Albumin: 3 g/dL — ABNORMAL LOW (ref 3.5–5.0)
Alkaline Phosphatase: 147 U/L — ABNORMAL HIGH (ref 38–126)
Anion gap: 6 (ref 5–15)
BUN: 31 mg/dL — ABNORMAL HIGH (ref 8–23)
CO2: 26 mmol/L (ref 22–32)
Calcium: 9.7 mg/dL (ref 8.9–10.3)
Chloride: 104 mmol/L (ref 98–111)
Creatinine: 0.78 mg/dL (ref 0.61–1.24)
GFR, Est AFR Am: >60 mL/min (ref >60)
GFR, Estimated: >60 mL/min (ref >60)
Glucose, Bld: 103 mg/dL — ABNORMAL HIGH (ref 70–99)
Potassium: 3.7 mmol/L (ref 3.5–5.1)
Sodium: 136 mmol/L (ref 135–145)
Total Bilirubin: 0.8 mg/dL (ref 0.3–1.2)
Total Protein: 6.8 g/dL (ref 6.5–8.1)

## 2020-08-15 MED ORDER — PACLITAXEL PROTEIN-BOUND CHEMO INJECTION 100 MG
75.0000 mg/m2 | Freq: Once | INTRAVENOUS | Status: AC
  Administered 2020-08-15: 150 mg via INTRAVENOUS
  Filled 2020-08-15: qty 30

## 2020-08-15 MED ORDER — SODIUM CHLORIDE 0.9 % IV SOLN
10.0000 mg | Freq: Once | INTRAVENOUS | Status: AC
  Administered 2020-08-15: 10 mg via INTRAVENOUS
  Filled 2020-08-15: qty 10

## 2020-08-15 MED ORDER — HEPARIN SOD (PORK) LOCK FLUSH 100 UNIT/ML IV SOLN
500.0000 [IU] | Freq: Once | INTRAVENOUS | Status: DC | PRN
  Filled 2020-08-15: qty 5

## 2020-08-15 MED ORDER — SODIUM CHLORIDE 0.9% FLUSH
10.0000 mL | INTRAVENOUS | Status: DC | PRN
  Administered 2020-08-15: 10 mL
  Filled 2020-08-15: qty 10

## 2020-08-15 MED ORDER — SODIUM CHLORIDE 0.9 % IV SOLN
800.0000 mg/m2 | Freq: Once | INTRAVENOUS | Status: AC
  Administered 2020-08-15: 1520 mg via INTRAVENOUS
  Filled 2020-08-15: qty 39.98

## 2020-08-15 MED ORDER — SODIUM CHLORIDE 0.9 % IV SOLN
Freq: Once | INTRAVENOUS | Status: AC
  Filled 2020-08-15: qty 250

## 2020-08-15 MED ORDER — ONDANSETRON HCL 4 MG/2ML IJ SOLN
8.0000 mg | Freq: Once | INTRAMUSCULAR | Status: AC
  Administered 2020-08-15: 8 mg via INTRAVENOUS

## 2020-08-15 MED ORDER — SODIUM CHLORIDE 0.9% FLUSH
10.0000 mL | INTRAVENOUS | Status: DC | PRN
  Filled 2020-08-15: qty 10

## 2020-08-15 MED ORDER — ONDANSETRON HCL 4 MG/2ML IJ SOLN
INTRAMUSCULAR | Status: AC
  Filled 2020-08-15: qty 2

## 2020-08-15 NOTE — Patient Instructions (Signed)
[facility] Discharge Instructions for Patients Receiving Chemotherapy  Today you received the following chemotherapy agents abraxane, gemzar  To help prevent nausea and vomiting after your treatment, we encourage you to take your nausea medication as directed.   If you develop nausea and vomiting that is not controlled by your nausea medication, call the clinic.   BELOW ARE SYMPTOMS THAT SHOULD BE REPORTED IMMEDIATELY:  *FEVER GREATER THAN 100.5 F  *CHILLS WITH OR WITHOUT FEVER  NAUSEA AND VOMITING THAT IS NOT CONTROLLED WITH YOUR NAUSEA MEDICATION  *UNUSUAL SHORTNESS OF BREATH  *UNUSUAL BRUISING OR BLEEDING  TENDERNESS IN MOUTH AND THROAT WITH OR WITHOUT PRESENCE OF ULCERS  *URINARY PROBLEMS  *BOWEL PROBLEMS  UNUSUAL RASH Items with * indicate a potential emergency and should be followed up as soon as possible.  Feel free to call the clinic should you have any questions or concerns. The clinic phone number is [phone].  Please show the CHEMO ALERT CARD at check-in to the Emergency Department and triage nurse.   

## 2020-08-15 NOTE — Progress Notes (Addendum)
[place]   Telephone:(336) [phone] Fax:(336) [phone]   Clinic Follow up Note   Patient Care Team: [name], MD as PCP - General (Family Medicine) Croitoru, Dani Gobble, MD as PCP - Cardiology (Cardiology) Jonnie Finner, RN as Oncology Nurse Navigator Owens Shark, NP as Nurse Practitioner (Hematology and Oncology) Ladell Pier, MD as Consulting Physician (Oncology) 08/15/2020  CHIEF COMPLAINT: Follow-up pancreas cancer  CURRENT THERAPY: Gemcitabine and Abraxane on days 1 and 15 q28 days  INTERVAL HISTORY: Gary [name] returns for follow-up and treatment as scheduled. He completed cycle 3-day 1 on 8/18. He continues prednisone 10 mg daily for his appetite and energy. He remains out of bed, eating and drinking well. No nausea, vomiting, constipation, diarrhea. He does have more insomnia lately. He has clear nasal drainage, no sore throat, fever, chills, cough, chest pain, dyspnea. He denies any pain. He has no new concerns.    MEDICAL HISTORY:  Past Medical History:  Diagnosis Date  . Ascending aortic aneurysm ([place]) 01/13/2014   stable at 4.7 cm diameter since 2009  . Asystole x2, s/p pacemaker 01/13/2014  . Bradycardia    permanent pacemaker 11/22/08 St.Jude  . Cancer ([place])   . Family history of adverse reaction to anesthesia    brother died after anesthesia / nose polyp removal at [facility] (40 years ago)  . HTN (hypertension) 01/13/2014  . Hyperlipidemia   . Hypertension   . Pacemaker - leads 1987, last generator 2009 St. Jude dual-chamber 01/13/2014   Atrial lead model 433-01, ventricular lead model 431-01 implanted November 1987 Right ventricular lead dysfunction with low impedance and mediocre sensing but satisfactory pacing threshold  . Pacemaker lead malfunction 01/13/2014   Chronic low impedance and poor sensing of the ventricular lead    SURGICAL HISTORY: Past Surgical History:  Procedure Laterality Date  . IR IMAGING GUIDED PORT INSERTION   05/24/2020  . PERMANENT PACEMAKER GENERATOR CHANGE  11/22/08   ST. Jude  . PERMANENT PACEMAKER INSERTION  03/04/97   ST. Jude  . US ECHOCARDIOGRAPHY  05/16/11   AS mild to mod.,TR mild to mod., EF => 55%.    I have reviewed the social history and family history with the patient and they are unchanged from previous note.  ALLERGIES:  is allergic to gentamycin [gentamicin sulfate], hydrocodone, and aspirin.  MEDICATIONS:  Current Outpatient Medications  Medication Sig Dispense Refill  . aspirin 81 MG tablet Take 81 mg by mouth daily.    Marland Kitchen docusate sodium (COLACE) 100 MG capsule Take 100 mg by mouth daily.    . enalapril (VASOTEC) 20 MG tablet TAKE 1 TABLET(20 MG) BY MOUTH TWICE DAILY 180 tablet 1  . lidocaine-prilocaine (EMLA) cream Apply 1 application topically as directed. Apply 1 hour prior to IV stick and cover with plastic wrap 30 g 2  . metoprolol succinate (TOPROL-XL) 50 MG 24 hr tablet TAKE 1 TABLET(50 MG) BY MOUTH DAILY 90 tablet 2  . omeprazole (PRILOSEC) 20 MG capsule Take 1 capsule (20 mg total) by mouth daily. 90 capsule 3  . ondansetron (ZOFRAN) 8 MG tablet Take 1 tablet (8 mg total) by mouth every 8 (eight) hours as needed for nausea or vomiting. 30 tablet 1  . predniSONE (DELTASONE) 10 MG tablet Take 1 tablet (10 mg total) by mouth daily with breakfast. 30 tablet 1  . prochlorperazine (COMPAZINE) 5 MG tablet Take 1-2 tablets (5-10 mg total) by mouth every 6 (six) hours as needed for nausea or vomiting. 30 tablet  1  . triamcinolone lotion (KENALOG) 0.1 % Apply 1 application topically 3 (three) times daily. 120 mL 2   No current facility-administered medications for this visit.   Facility-Administered Medications Ordered in Other Visits  Medication Dose Route Frequency Provider Last Rate Last Admin  . gemcitabine (GEMZAR) 1,520 mg in sodium chloride 0.9 % 250 mL chemo infusion  800 mg/m2 (Treatment Plan Recorded) Intravenous Once Betsy Coder B, MD      . heparin lock flush  100 unit/mL  500 Units Intracatheter Once PRN Ladell Pier, MD      . PACLitaxel-protein bound (ABRAXANE) chemo infusion 150 mg  75 mg/m2 (Treatment Plan Recorded) Intravenous Once Ladell Pier, MD 60 mL/hr at 08/15/20 1346 150 mg at 08/15/20 1346  . sodium chloride flush (NS) 0.9 % injection 10 mL  10 mL Intracatheter PRN Ladell Pier, MD        PHYSICAL EXAMINATION:  Vitals:   08/15/20 1136  BP: 139/74  Pulse: 72  Resp: 18  Temp: 97.7 F (36.5 C)  SpO2: 100%   Filed Weights   08/15/20 1136  Weight: 164 lb 8 oz (74.6 kg)    GENERAL:alert, no distress and comfortable SKIN: No rash to exposed skin EYES:  sclera clear LUNGS:  normal breathing effort HEART: Mild lower extremity edema NEURO: alert & oriented x 3 with fluent speech PAC without erythema Visual exam performed in wheelchair  LABORATORY DATA:  I have reviewed the data as listed CBC Latest Ref Rng & Units 08/15/2020 08/02/2020 07/18/2020  WBC 4.0 - 10.5 K/uL 6.1 8.1 6.6  Hemoglobin 13.0 - 17.0 g/dL 11.0(L) 11.3(L) 11.3(L)  Hematocrit 39 - 52 % 34.5(L) 35.1(L) 34.4(L)  Platelets 150 - 400 K/uL 197 233 266     CMP Latest Ref Rng & Units 08/15/2020 08/02/2020 07/18/2020  Glucose 70 - 99 mg/dL 103(H) 102(H) 101(H)  BUN 8 - 23 mg/dL 31(H) 21 22  Creatinine 0.61 - 1.24 mg/dL 0.78 0.85 0.83  Sodium 135 - 145 mmol/L 136 136 137  Potassium 3.5 - 5.1 mmol/L 3.7 4.0 3.9  Chloride 98 - 111 mmol/L 104 104 105  CO2 22 - 32 mmol/L 26 25 24   Calcium 8.9 - 10.3 mg/dL 9.7 9.4 9.1  Total Protein 6.5 - 8.1 g/dL 6.8 6.8 6.9  Total Bilirubin 0.3 - 1.2 mg/dL 0.8 1.0 0.9  Alkaline Phos 38 - 126 U/L 147(H) 177(H) 156(H)  AST 15 - 41 U/L 30 31 29   ALT 0 - 44 U/L 28 31 26       RADIOGRAPHIC STUDIES: I have personally reviewed the radiological images as listed and agreed with the findings in the report. No results found.   ASSESSMENT & PLAN:   Assessment/Plan: 1. Pancreas tail lesion, multiple liver lesions   Chest CT  04/12/2020-ascending thoracic aortic aneurysm; hypovascular lesion tail of the pancreas, new compared to the prior examination. Multiple poorly defined hypovascular lesions scattered throughout the liver   CT abdomen/pelvis 05/02/2020-hypoenhancing lesion of the pancreatic tail measuring approximately 2.4 x 1.9 cm; enlarged portacaval lymph nodes measuring up to 1.9 x 1.4 cm; numerous hypodense lesions throughout the liver, index lesion of the liver dome measuring 1.7 x 1.2 cm.  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 Thoracic aortic aneurysm 3. Hypertension 4. Hyperlipidemia 5. History of bradycardia status post permanent pacemaker 6. Mother hadcervicalcancer 7. Pitting lower leg edema 8. GERD, on omeprazole  Disposition:  Mr. Rising appears stable. He completed another cycle of gemcitabine and Abraxane. He tolerates treatment well overall. He plans to reduce prednisone on his week off chemo due to insomnia. He is otherwise able to function and recover well.  We reviewed the CBC and CMP. The CA 19-9 fluctuates, but has improved from 3 months ago. We will follow up on the level from today. He will proceed with cycle 3 day 15 gemcitabine and Abraxane today as planned.   He is being referred for restaging CT scan after today's treatment. He will follow-up in 2 weeks. The patient was seen with Dr. Benay Spice.    Orders Placed This Encounter  Procedures  . CT Abdomen  Pelvis W Contrast    Standing Status:   Future    Standing Expiration Date:   08/15/2021    Order Specific Question:   If indicated for the ordered procedure, I authorize the administration of contrast media per Radiology protocol    Answer:   Yes    Order Specific Question:   Preferred imaging location?    Answer:   [facility]    Order Specific Question:   Is Oral Contrast requested for this exam?    Answer:   Yes, Per Radiology protocol    Order Specific Question:   Radiology Contrast Protocol - do NOT remove file path    Answer:   [email or web address]  . CBC with Differential (Cancer Center Only)    Standing Status:   Future    Standing Expiration Date:   08/15/2021  . CMP ([facility] only)    Standing Status:   Future    Standing Expiration Date:   08/15/2021  . CA 19.9    Standing Status:   Future    Standing Expiration Date:   08/15/2021  . CA 19.9    Standing Status:   Future    Number of Occurrences:   1    Standing Expiration Date:   08/15/2021   All questions were answered. The patient knows to call the clinic with any problems, questions or concerns. No barriers to learning were detected.     Alla Feeling, NP 08/15/20  This was a shared visit with Cira Rue.  Mr. Esco is tolerating the chemotherapy well.  His clinical status has improved.  He will complete another treatment with gemcitabine/Abraxane today.  He will be scheduled for a restaging CT after this cycle.  Julieanne Manson, MD

## 2020-08-16 LAB — CANCER ANTIGEN 19-9: CA 19-9: 17422 U/mL — ABNORMAL HIGH (ref 0–35)

## 2020-08-18 DIAGNOSIS — C252 Malignant neoplasm of tail of pancreas: Secondary | ICD-10-CM | POA: Diagnosis not present

## 2020-08-18 DIAGNOSIS — E785 Hyperlipidemia, unspecified: Secondary | ICD-10-CM | POA: Diagnosis not present

## 2020-08-18 DIAGNOSIS — I10 Essential (primary) hypertension: Secondary | ICD-10-CM | POA: Diagnosis not present

## 2020-08-18 DIAGNOSIS — K769 Liver disease, unspecified: Secondary | ICD-10-CM | POA: Diagnosis not present

## 2020-08-18 DIAGNOSIS — C253 Malignant neoplasm of pancreatic duct: Secondary | ICD-10-CM | POA: Diagnosis not present

## 2020-08-18 DIAGNOSIS — I712 Thoracic aortic aneurysm, without rupture: Secondary | ICD-10-CM | POA: Diagnosis not present

## 2020-08-22 ENCOUNTER — Telehealth: Payer: Self-pay

## 2020-08-22 DIAGNOSIS — I10 Essential (primary) hypertension: Secondary | ICD-10-CM | POA: Diagnosis not present

## 2020-08-22 DIAGNOSIS — K769 Liver disease, unspecified: Secondary | ICD-10-CM | POA: Diagnosis not present

## 2020-08-22 DIAGNOSIS — E785 Hyperlipidemia, unspecified: Secondary | ICD-10-CM | POA: Diagnosis not present

## 2020-08-22 DIAGNOSIS — C253 Malignant neoplasm of pancreatic duct: Secondary | ICD-10-CM | POA: Diagnosis not present

## 2020-08-22 DIAGNOSIS — I712 Thoracic aortic aneurysm, without rupture: Secondary | ICD-10-CM | POA: Diagnosis not present

## 2020-08-22 DIAGNOSIS — C252 Malignant neoplasm of tail of pancreas: Secondary | ICD-10-CM | POA: Diagnosis not present

## 2020-08-22 NOTE — Telephone Encounter (Signed)
Patient's daughter Bethena Roys calls stating the patient has had diarrhea on and off for past 3 to 4 days.  Had eaten figs and lentils which she felt definitely contributed to this.  He has not tried anything for diarrhea.  I instructed her for him to take Imodium OTC, take 2 initially and then one tablet with each subsequent diarrhea episode.  Encourage increased fluid intake.  She is to call back if this does not help.

## 2020-08-23 DIAGNOSIS — E785 Hyperlipidemia, unspecified: Secondary | ICD-10-CM | POA: Diagnosis not present

## 2020-08-23 DIAGNOSIS — C252 Malignant neoplasm of tail of pancreas: Secondary | ICD-10-CM | POA: Diagnosis not present

## 2020-08-23 DIAGNOSIS — C253 Malignant neoplasm of pancreatic duct: Secondary | ICD-10-CM | POA: Diagnosis not present

## 2020-08-23 DIAGNOSIS — I712 Thoracic aortic aneurysm, without rupture: Secondary | ICD-10-CM | POA: Diagnosis not present

## 2020-08-23 DIAGNOSIS — I10 Essential (primary) hypertension: Secondary | ICD-10-CM | POA: Diagnosis not present

## 2020-08-23 DIAGNOSIS — K769 Liver disease, unspecified: Secondary | ICD-10-CM | POA: Diagnosis not present

## 2020-08-25 ENCOUNTER — Telehealth: Payer: Self-pay | Admitting: *Deleted

## 2020-08-25 ENCOUNTER — Ambulatory Visit (HOSPITAL_COMMUNITY)
Admission: RE | Admit: 2020-08-25 | Discharge: 2020-08-25 | Disposition: A | Discharge status: Home / Self Care | Payer: Medicare Other | Source: Ambulatory Visit | Attending: Nurse Practitioner | Admitting: Nurse Practitioner

## 2020-08-25 ENCOUNTER — Other Ambulatory Visit: Payer: Self-pay

## 2020-08-25 DIAGNOSIS — C252 Malignant neoplasm of tail of pancreas: Secondary | ICD-10-CM | POA: Diagnosis not present

## 2020-08-25 DIAGNOSIS — K8020 Calculus of gallbladder without cholecystitis without obstruction: Secondary | ICD-10-CM | POA: Diagnosis not present

## 2020-08-25 DIAGNOSIS — K449 Diaphragmatic hernia without obstruction or gangrene: Secondary | ICD-10-CM | POA: Diagnosis not present

## 2020-08-25 DIAGNOSIS — K5730 Diverticulosis of large intestine without perforation or abscess without bleeding: Secondary | ICD-10-CM | POA: Diagnosis not present

## 2020-08-25 MED ORDER — IOHEXOL 300 MG/ML  SOLN
100.0000 mL | Freq: Once | INTRAMUSCULAR | Status: AC | PRN
  Administered 2020-08-25: 100 mL via INTRAVENOUS

## 2020-08-25 NOTE — Telephone Encounter (Signed)
Wife reports small amount of blood in tissue from left nostril when he blows nose. Does not have a dripping nosebleed. Informed her this is of no concern. He can obtain saline nasal spray to keep nostrils moist and put vasoline in q-tip and put inside nostril.

## 2020-08-27 ENCOUNTER — Other Ambulatory Visit: Payer: Self-pay | Admitting: Oncology

## 2020-08-27 DIAGNOSIS — I10 Essential (primary) hypertension: Secondary | ICD-10-CM | POA: Diagnosis not present

## 2020-08-27 DIAGNOSIS — Z7952 Long term (current) use of systemic steroids: Secondary | ICD-10-CM | POA: Diagnosis not present

## 2020-08-27 DIAGNOSIS — K769 Liver disease, unspecified: Secondary | ICD-10-CM | POA: Diagnosis not present

## 2020-08-27 DIAGNOSIS — I712 Thoracic aortic aneurysm, without rupture: Secondary | ICD-10-CM | POA: Diagnosis not present

## 2020-08-27 DIAGNOSIS — Z950 Presence of cardiac pacemaker: Secondary | ICD-10-CM | POA: Diagnosis not present

## 2020-08-27 DIAGNOSIS — Z7982 Long term (current) use of aspirin: Secondary | ICD-10-CM | POA: Diagnosis not present

## 2020-08-27 DIAGNOSIS — C252 Malignant neoplasm of tail of pancreas: Secondary | ICD-10-CM | POA: Diagnosis not present

## 2020-08-27 DIAGNOSIS — E785 Hyperlipidemia, unspecified: Secondary | ICD-10-CM | POA: Diagnosis not present

## 2020-08-27 DIAGNOSIS — C253 Malignant neoplasm of pancreatic duct: Secondary | ICD-10-CM | POA: Diagnosis not present

## 2020-08-29 DIAGNOSIS — C253 Malignant neoplasm of pancreatic duct: Secondary | ICD-10-CM | POA: Diagnosis not present

## 2020-08-29 DIAGNOSIS — K769 Liver disease, unspecified: Secondary | ICD-10-CM | POA: Diagnosis not present

## 2020-08-29 DIAGNOSIS — I10 Essential (primary) hypertension: Secondary | ICD-10-CM | POA: Diagnosis not present

## 2020-08-29 DIAGNOSIS — I712 Thoracic aortic aneurysm, without rupture: Secondary | ICD-10-CM | POA: Diagnosis not present

## 2020-08-29 DIAGNOSIS — C252 Malignant neoplasm of tail of pancreas: Secondary | ICD-10-CM | POA: Diagnosis not present

## 2020-08-29 DIAGNOSIS — E785 Hyperlipidemia, unspecified: Secondary | ICD-10-CM | POA: Diagnosis not present

## 2020-08-30 ENCOUNTER — Inpatient Hospital Stay (HOSPITAL_BASED_OUTPATIENT_CLINIC_OR_DEPARTMENT_OTHER): Payer: Medicare Other | Admitting: Nurse Practitioner

## 2020-08-30 ENCOUNTER — Other Ambulatory Visit: Payer: Medicare Other

## 2020-08-30 ENCOUNTER — Inpatient Hospital Stay: Payer: Medicare Other | Attending: Oncology

## 2020-08-30 ENCOUNTER — Encounter: Payer: Self-pay | Admitting: Nurse Practitioner

## 2020-08-30 ENCOUNTER — Inpatient Hospital Stay: Payer: Medicare Other

## 2020-08-30 ENCOUNTER — Ambulatory Visit: Payer: Medicare Other | Admitting: Oncology

## 2020-08-30 ENCOUNTER — Other Ambulatory Visit: Payer: Self-pay

## 2020-08-30 ENCOUNTER — Ambulatory Visit: Payer: Medicare Other

## 2020-08-30 VITALS — BP 134/85 | HR 69 | Temp 97.7°F | Resp 17 | Ht 66.0 in | Wt 164.0 lb

## 2020-08-30 DIAGNOSIS — C787 Secondary malignant neoplasm of liver and intrahepatic bile duct: Secondary | ICD-10-CM | POA: Diagnosis not present

## 2020-08-30 DIAGNOSIS — Z95828 Presence of other vascular implants and grafts: Secondary | ICD-10-CM

## 2020-08-30 DIAGNOSIS — Z452 Encounter for adjustment and management of vascular access device: Secondary | ICD-10-CM | POA: Diagnosis not present

## 2020-08-30 DIAGNOSIS — Z5111 Encounter for antineoplastic chemotherapy: Secondary | ICD-10-CM | POA: Diagnosis not present

## 2020-08-30 DIAGNOSIS — I10 Essential (primary) hypertension: Secondary | ICD-10-CM | POA: Insufficient documentation

## 2020-08-30 DIAGNOSIS — C252 Malignant neoplasm of tail of pancreas: Secondary | ICD-10-CM | POA: Diagnosis not present

## 2020-08-30 LAB — CMP (CANCER CENTER ONLY)
ALT: 25 U/L (ref 0–44)
AST: 27 U/L (ref 15–41)
Albumin: 3 g/dL — ABNORMAL LOW (ref 3.5–5.0)
Alkaline Phosphatase: 141 U/L — ABNORMAL HIGH (ref 38–126)
Anion gap: 5 (ref 5–15)
BUN: 25 mg/dL — ABNORMAL HIGH (ref 8–23)
CO2: 27 mmol/L (ref 22–32)
Calcium: 8.9 mg/dL (ref 8.9–10.3)
Chloride: 104 mmol/L (ref 98–111)
Creatinine: 0.82 mg/dL (ref 0.61–1.24)
GFR, Est AFR Am: >60 mL/min (ref >60)
GFR, Estimated: >60 mL/min (ref >60)
Glucose, Bld: 95 mg/dL (ref 70–99)
Potassium: 3.9 mmol/L (ref 3.5–5.1)
Sodium: 136 mmol/L (ref 135–145)
Total Bilirubin: 0.8 mg/dL (ref 0.3–1.2)
Total Protein: 6.7 g/dL (ref 6.5–8.1)

## 2020-08-30 LAB — CBC WITH DIFFERENTIAL (CANCER CENTER ONLY)
Abs Immature Granulocytes: 0.05 10*3/uL (ref 0.00–0.07)
Basophils Absolute: 0 10*3/uL (ref 0.0–0.1)
Basophils Relative: 0 %
Eosinophils Absolute: 0 10*3/uL (ref 0.0–0.5)
Eosinophils Relative: 0 %
HCT: 34.9 % — ABNORMAL LOW (ref 39.0–52.0)
Hemoglobin: 11.2 g/dL — ABNORMAL LOW (ref 13.0–17.0)
Immature Granulocytes: 1 %
Lymphocytes Relative: 10 %
Lymphs Abs: 0.8 10*3/uL (ref 0.7–4.0)
MCH: 28.1 pg (ref 26.0–34.0)
MCHC: 32.1 g/dL (ref 30.0–36.0)
MCV: 87.5 fL (ref 80.0–100.0)
Monocytes Absolute: 0.9 10*3/uL (ref 0.1–1.0)
Monocytes Relative: 12 %
Neutro Abs: 6.3 10*3/uL (ref 1.7–7.7)
Neutrophils Relative %: 77 %
Platelet Count: 195 10*3/uL (ref 150–400)
RBC: 3.99 MIL/uL — ABNORMAL LOW (ref 4.22–5.81)
RDW: 18.2 % — ABNORMAL HIGH (ref 11.5–15.5)
WBC Count: 8.1 10*3/uL (ref 4.0–10.5)
nRBC: 0 % (ref 0.0–0.2)

## 2020-08-30 MED ORDER — SODIUM CHLORIDE 0.9% FLUSH
10.0000 mL | INTRAVENOUS | Status: DC | PRN
  Administered 2020-08-30: 10 mL
  Filled 2020-08-30: qty 10

## 2020-08-30 MED ORDER — HEPARIN SOD (PORK) LOCK FLUSH 100 UNIT/ML IV SOLN
500.0000 [IU] | Freq: Once | INTRAVENOUS | Status: DC | PRN
  Filled 2020-08-30: qty 5

## 2020-08-30 MED ORDER — ALTEPLASE 2 MG IJ SOLR
2.0000 mg | Freq: Once | INTRAMUSCULAR | Status: DC | PRN
  Filled 2020-08-30: qty 2

## 2020-08-30 NOTE — Progress Notes (Addendum)
[place] [facility] OFFICE PROGRESS NOTE   Diagnosis: Pancreas cancer  INTERVAL HISTORY:   Gary [name] returns as scheduled.  He completed cycle 6 gemcitabine/Abraxane 08/15/2020.  Reports feeling well.  He has a good appetite.  No nausea or vomiting.  No mouth sores.  He had an episode of diarrhea after having constipation.  No fever, cough, shortness of breath.  No rash.  No neuropathy symptoms.  Objective:  Vital signs in last 24 hours:  Blood pressure 134/85, pulse 69, temperature 97.7 F (36.5 C), temperature source Tympanic, resp. rate 17, height 5\' 6"  (1.676 m), weight 164 lb (74.4 kg), SpO2 100 %.    HEENT: No thrush or ulcers. Resp: Lungs clear bilaterally. Cardio: Regular rate and rhythm. GI: Abdomen soft and nontender.  No hepatomegaly.  Vascular: Trace bilateral pretibial/ankle edema. Port-A-Cath without erythema.  Lab Results:  Lab Results  Component Value Date   WBC 8.1 08/30/2020   HGB 11.2 (L) 08/30/2020   HCT 34.9 (L) 08/30/2020   MCV 87.5 08/30/2020   PLT 195 08/30/2020   NEUTROABS 6.3 08/30/2020    Imaging:  No results found.  Medications: I have reviewed the patient's current medications.  Assessment/Plan: 1. Pancreas tail lesion, multiple liver lesions   Chest CT 04/12/2020-ascending thoracic aortic aneurysm; hypovascular lesion tail of the pancreas, new compared to the prior examination. Multiple poorly defined hypovascular lesions scattered throughout the liver   CT abdomen/pelvis 05/02/2020-hypoenhancing lesion of the pancreatic tail measuring approximately 2.4 x 1.9 cm; enlarged portacaval lymph nodes measuring up to 1.9 x 1.4 cm; numerous hypodense lesions throughout the liver, index lesion of the liver dome measuring 1.7 x 1.2 cm.  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  Liver metastases reported as increased, review requested.  2. Thoracic aortic aneurysm 3. Hypertension 4. Hyperlipidemia 5. History of bradycardia status post permanent pacemaker 6. Mother hadcervicalcancer 7. Pitting lower leg edema 8. GERD, on omeprazole  Disposition: Gary [name] appears stable.  He has completed 6 cycles of gemcitabine/Abraxane.  Clinical status continues to be improved.  CA 19-9 is better.  The recent CT scan indicates disease in the liver is worse, pancreas tail mass and portacaval lymphadenopathy decreased.  Per Dr. Gearldine Shown review of the images, liver metastases have improved.  We will ask radiology to review.  Gary [name] would like to cancel today's treatment and reschedule for 1 week, subsequent treatments at a 3-week interval.  He will return for the next cycle of gemcitabine/Abraxane in 1 week.  We will see him in follow-up in 4 weeks.  He will contact the office in the interim with any problems.  Patient seen with Dr. Benay Spice.  CT images reviewed on the computer with Gary [name] and his son by Dr. Benay Spice.  Ned Card ANP/GNP-BC   08/30/2020  2:26 PM This was a shared visit with Ned Card.  We reviewed the restaging CT images with Gary [name] and his son.  The pancreas mass and liver lesions appear smaller.  We will ask radiology for a repeat reading of the liver lesions.  His clinical  status and CA 19-9 have improved.  The plan is to continue gemcitabine/Abraxane.  He would like to change  chemotherapy to a 3-week interval.  Julieanne Manson, MD

## 2020-08-30 NOTE — Patient Instructions (Signed)

## 2020-08-31 ENCOUNTER — Telehealth: Payer: Self-pay | Admitting: Oncology

## 2020-08-31 LAB — CANCER ANTIGEN 19-9: CA 19-9: 18267 U/mL — ABNORMAL HIGH (ref 0–35)

## 2020-08-31 NOTE — Telephone Encounter (Signed)
Scheduled appointments per 9/15 los. Spoke with patient's wife. Attempted to go over appointments with her, but she said she already knew about the appointments. I did tell her the start time for appointment on 9/21.

## 2020-09-01 ENCOUNTER — Telehealth: Payer: Self-pay | Admitting: Cardiovascular Disease

## 2020-09-01 DIAGNOSIS — E785 Hyperlipidemia, unspecified: Secondary | ICD-10-CM | POA: Diagnosis not present

## 2020-09-01 DIAGNOSIS — K769 Liver disease, unspecified: Secondary | ICD-10-CM | POA: Diagnosis not present

## 2020-09-01 DIAGNOSIS — I712 Thoracic aortic aneurysm, without rupture: Secondary | ICD-10-CM | POA: Diagnosis not present

## 2020-09-01 DIAGNOSIS — I10 Essential (primary) hypertension: Secondary | ICD-10-CM | POA: Diagnosis not present

## 2020-09-01 DIAGNOSIS — C253 Malignant neoplasm of pancreatic duct: Secondary | ICD-10-CM | POA: Diagnosis not present

## 2020-09-01 DIAGNOSIS — C252 Malignant neoplasm of tail of pancreas: Secondary | ICD-10-CM | POA: Diagnosis not present

## 2020-09-01 NOTE — Telephone Encounter (Signed)
Patient states he will call back to schedule 12 month device check appointment with Dr. Sallyanne Kuster

## 2020-09-04 DIAGNOSIS — I10 Essential (primary) hypertension: Secondary | ICD-10-CM | POA: Diagnosis not present

## 2020-09-04 DIAGNOSIS — E785 Hyperlipidemia, unspecified: Secondary | ICD-10-CM | POA: Diagnosis not present

## 2020-09-04 DIAGNOSIS — I712 Thoracic aortic aneurysm, without rupture: Secondary | ICD-10-CM | POA: Diagnosis not present

## 2020-09-04 DIAGNOSIS — C252 Malignant neoplasm of tail of pancreas: Secondary | ICD-10-CM | POA: Diagnosis not present

## 2020-09-04 DIAGNOSIS — K769 Liver disease, unspecified: Secondary | ICD-10-CM | POA: Diagnosis not present

## 2020-09-04 DIAGNOSIS — C253 Malignant neoplasm of pancreatic duct: Secondary | ICD-10-CM | POA: Diagnosis not present

## 2020-09-05 ENCOUNTER — Other Ambulatory Visit: Payer: Medicare Other

## 2020-09-05 ENCOUNTER — Inpatient Hospital Stay: Payer: Medicare Other

## 2020-09-05 ENCOUNTER — Ambulatory Visit: Payer: Medicare Other | Admitting: Oncology

## 2020-09-05 ENCOUNTER — Other Ambulatory Visit: Payer: Self-pay

## 2020-09-05 VITALS — BP 128/73 | HR 63 | Temp 98.7°F | Resp 18

## 2020-09-05 DIAGNOSIS — C787 Secondary malignant neoplasm of liver and intrahepatic bile duct: Secondary | ICD-10-CM | POA: Diagnosis not present

## 2020-09-05 DIAGNOSIS — C252 Malignant neoplasm of tail of pancreas: Secondary | ICD-10-CM | POA: Diagnosis not present

## 2020-09-05 DIAGNOSIS — I10 Essential (primary) hypertension: Secondary | ICD-10-CM | POA: Diagnosis not present

## 2020-09-05 DIAGNOSIS — Z5111 Encounter for antineoplastic chemotherapy: Secondary | ICD-10-CM | POA: Diagnosis not present

## 2020-09-05 DIAGNOSIS — Z95828 Presence of other vascular implants and grafts: Secondary | ICD-10-CM

## 2020-09-05 DIAGNOSIS — Z452 Encounter for adjustment and management of vascular access device: Secondary | ICD-10-CM | POA: Diagnosis not present

## 2020-09-05 LAB — CMP (CANCER CENTER ONLY)
ALT: 25 U/L (ref 0–44)
AST: 28 U/L (ref 15–41)
Albumin: 2.8 g/dL — ABNORMAL LOW (ref 3.5–5.0)
Alkaline Phosphatase: 127 U/L — ABNORMAL HIGH (ref 38–126)
Anion gap: 8 (ref 5–15)
BUN: 27 mg/dL — ABNORMAL HIGH (ref 8–23)
CO2: 24 mmol/L (ref 22–32)
Calcium: 8.7 mg/dL — ABNORMAL LOW (ref 8.9–10.3)
Chloride: 105 mmol/L (ref 98–111)
Creatinine: 0.82 mg/dL (ref 0.61–1.24)
GFR, Est AFR Am: >60 mL/min (ref >60)
GFR, Estimated: >60 mL/min (ref >60)
Glucose, Bld: 103 mg/dL — ABNORMAL HIGH (ref 70–99)
Potassium: 3.8 mmol/L (ref 3.5–5.1)
Sodium: 137 mmol/L (ref 135–145)
Total Bilirubin: 0.7 mg/dL (ref 0.3–1.2)
Total Protein: 6.7 g/dL (ref 6.5–8.1)

## 2020-09-05 LAB — CBC WITH DIFFERENTIAL (CANCER CENTER ONLY)
Abs Immature Granulocytes: 0.04 10*3/uL (ref 0.00–0.07)
Basophils Absolute: 0 10*3/uL (ref 0.0–0.1)
Basophils Relative: 0 %
Eosinophils Absolute: 0 10*3/uL (ref 0.0–0.5)
Eosinophils Relative: 0 %
HCT: 34.8 % — ABNORMAL LOW (ref 39.0–52.0)
Hemoglobin: 11.2 g/dL — ABNORMAL LOW (ref 13.0–17.0)
Immature Granulocytes: 0 %
Lymphocytes Relative: 8 %
Lymphs Abs: 0.8 10*3/uL (ref 0.7–4.0)
MCH: 28.4 pg (ref 26.0–34.0)
MCHC: 32.2 g/dL (ref 30.0–36.0)
MCV: 88.1 fL (ref 80.0–100.0)
Monocytes Absolute: 0.8 10*3/uL (ref 0.1–1.0)
Monocytes Relative: 9 %
Neutro Abs: 7.8 10*3/uL — ABNORMAL HIGH (ref 1.7–7.7)
Neutrophils Relative %: 83 %
Platelet Count: 224 10*3/uL (ref 150–400)
RBC: 3.95 MIL/uL — ABNORMAL LOW (ref 4.22–5.81)
RDW: 18.2 % — ABNORMAL HIGH (ref 11.5–15.5)
WBC Count: 9.5 10*3/uL (ref 4.0–10.5)
nRBC: 0 % (ref 0.0–0.2)

## 2020-09-05 MED ORDER — ONDANSETRON HCL 4 MG/2ML IJ SOLN
8.0000 mg | Freq: Once | INTRAMUSCULAR | Status: AC
  Administered 2020-09-05: 8 mg via INTRAVENOUS

## 2020-09-05 MED ORDER — SODIUM CHLORIDE 0.9 % IV SOLN
Freq: Once | INTRAVENOUS | Status: AC
  Filled 2020-09-05: qty 250

## 2020-09-05 MED ORDER — SODIUM CHLORIDE 0.9% FLUSH
10.0000 mL | INTRAVENOUS | Status: DC | PRN
  Administered 2020-09-05: 10 mL
  Filled 2020-09-05: qty 10

## 2020-09-05 MED ORDER — PACLITAXEL PROTEIN-BOUND CHEMO INJECTION 100 MG
75.0000 mg/m2 | Freq: Once | INTRAVENOUS | Status: AC
  Administered 2020-09-05: 150 mg via INTRAVENOUS
  Filled 2020-09-05: qty 30

## 2020-09-05 MED ORDER — SODIUM CHLORIDE 0.9 % IV SOLN
800.0000 mg/m2 | Freq: Once | INTRAVENOUS | Status: AC
  Administered 2020-09-05: 1520 mg via INTRAVENOUS
  Filled 2020-09-05: qty 39.98

## 2020-09-05 MED ORDER — SODIUM CHLORIDE 0.9% FLUSH
10.0000 mL | INTRAVENOUS | Status: DC | PRN
  Administered 2020-09-05 (×2): 10 mL
  Filled 2020-09-05: qty 10

## 2020-09-05 MED ORDER — SODIUM CHLORIDE 0.9 % IV SOLN
10.0000 mg | Freq: Once | INTRAVENOUS | Status: AC
  Administered 2020-09-05: 10 mg via INTRAVENOUS
  Filled 2020-09-05: qty 10
  Filled 2020-09-05: qty 1

## 2020-09-05 MED ORDER — ONDANSETRON HCL 4 MG/2ML IJ SOLN
INTRAMUSCULAR | Status: AC
  Filled 2020-09-05: qty 4

## 2020-09-05 MED ORDER — HEPARIN SOD (PORK) LOCK FLUSH 100 UNIT/ML IV SOLN
500.0000 [IU] | Freq: Once | INTRAVENOUS | Status: AC | PRN
  Administered 2020-09-05: 500 [IU]
  Filled 2020-09-05: qty 5

## 2020-09-05 NOTE — Patient Instructions (Signed)
[facility] Discharge Instructions for Patients Receiving Chemotherapy  Today you received the following chemotherapy agents gemzar, abraxane  To help prevent nausea and vomiting after your treatment, we encourage you to take your nausea medication as directed.   If you develop nausea and vomiting that is not controlled by your nausea medication, call the clinic.   BELOW ARE SYMPTOMS THAT SHOULD BE REPORTED IMMEDIATELY:  *FEVER GREATER THAN 100.5 F  *CHILLS WITH OR WITHOUT FEVER  NAUSEA AND VOMITING THAT IS NOT CONTROLLED WITH YOUR NAUSEA MEDICATION  *UNUSUAL SHORTNESS OF BREATH  *UNUSUAL BRUISING OR BLEEDING  TENDERNESS IN MOUTH AND THROAT WITH OR WITHOUT PRESENCE OF ULCERS  *URINARY PROBLEMS  *BOWEL PROBLEMS  UNUSUAL RASH Items with * indicate a potential emergency and should be followed up as soon as possible.  Feel free to call the clinic should you have any questions or concerns. The clinic phone number is [phone].  Please show the CHEMO ALERT CARD at check-in to the Emergency Department and triage nurse.   

## 2020-09-06 ENCOUNTER — Other Ambulatory Visit: Payer: Self-pay

## 2020-09-08 DIAGNOSIS — E785 Hyperlipidemia, unspecified: Secondary | ICD-10-CM | POA: Diagnosis not present

## 2020-09-08 DIAGNOSIS — C253 Malignant neoplasm of pancreatic duct: Secondary | ICD-10-CM | POA: Diagnosis not present

## 2020-09-08 DIAGNOSIS — K769 Liver disease, unspecified: Secondary | ICD-10-CM | POA: Diagnosis not present

## 2020-09-08 DIAGNOSIS — I10 Essential (primary) hypertension: Secondary | ICD-10-CM | POA: Diagnosis not present

## 2020-09-08 DIAGNOSIS — C252 Malignant neoplasm of tail of pancreas: Secondary | ICD-10-CM | POA: Diagnosis not present

## 2020-09-08 DIAGNOSIS — I712 Thoracic aortic aneurysm, without rupture: Secondary | ICD-10-CM | POA: Diagnosis not present

## 2020-09-12 ENCOUNTER — Telehealth: Payer: Self-pay | Admitting: Cardiovascular Disease

## 2020-09-12 NOTE — Telephone Encounter (Signed)
I would suggest increasing the metoprolol from 50 mg once a day to to 50 mg twice a day and doing exactly the opposite with the enalapril: take 20 mg once daily instead of twice daily. Please send him my best wishes for good response to his treatment for the pancreatic cancer.

## 2020-09-12 NOTE — Telephone Encounter (Signed)
Reports heart rate has been elevated for the past wee in the 90's. Reports BP 119/51 and 117/82 Denies sob, chest pain, dizziness Medications reviewed Reports he is currently being treated for pancreatic cancer and is asking that we send message to Dr. Loletha Grayer to recommend treatment for his HR averaging in the 90's.

## 2020-09-12 NOTE — Telephone Encounter (Signed)
° ° °  STAT if HR is under 50 or over 120 (normal HR is 60-100 beats per minute)  1) What is your heart rate? It always above 90's   2) Do you have a log of your heart rate readings (document readings)?   3) Do you have any other symptoms? Pt said his HR been above 90's since last week. He would like to ask Dr. Loletha Grayer what he can do to lower it.

## 2020-09-13 DIAGNOSIS — K769 Liver disease, unspecified: Secondary | ICD-10-CM | POA: Diagnosis not present

## 2020-09-13 DIAGNOSIS — C252 Malignant neoplasm of tail of pancreas: Secondary | ICD-10-CM | POA: Diagnosis not present

## 2020-09-13 DIAGNOSIS — I712 Thoracic aortic aneurysm, without rupture: Secondary | ICD-10-CM | POA: Diagnosis not present

## 2020-09-13 DIAGNOSIS — I10 Essential (primary) hypertension: Secondary | ICD-10-CM | POA: Diagnosis not present

## 2020-09-13 DIAGNOSIS — C253 Malignant neoplasm of pancreatic duct: Secondary | ICD-10-CM | POA: Diagnosis not present

## 2020-09-13 DIAGNOSIS — E785 Hyperlipidemia, unspecified: Secondary | ICD-10-CM | POA: Diagnosis not present

## 2020-09-13 MED ORDER — ENALAPRIL MALEATE 20 MG PO TABS
20.0000 mg | ORAL_TABLET | Freq: Every day | ORAL | 1 refills | Status: DC
Start: 2020-09-13 — End: 2021-03-07

## 2020-09-13 MED ORDER — METOPROLOL SUCCINATE ER 50 MG PO TB24
50.0000 mg | ORAL_TABLET | Freq: Two times a day (BID) | ORAL | 2 refills | Status: DC
Start: 2020-09-13 — End: 2021-03-07

## 2020-09-13 NOTE — Telephone Encounter (Signed)
Patient's wife, Elison Worrel, informed and verbalized understanding of plan.

## 2020-09-13 NOTE — Telephone Encounter (Signed)
Left a message for the patient to call back.  

## 2020-09-13 NOTE — Telephone Encounter (Signed)
Please see prior note. Patient is aware.

## 2020-09-18 DIAGNOSIS — Z950 Presence of cardiac pacemaker: Secondary | ICD-10-CM | POA: Diagnosis not present

## 2020-09-18 DIAGNOSIS — E7800 Pure hypercholesterolemia, unspecified: Secondary | ICD-10-CM | POA: Diagnosis not present

## 2020-09-18 DIAGNOSIS — R270 Ataxia, unspecified: Secondary | ICD-10-CM | POA: Diagnosis not present

## 2020-09-18 DIAGNOSIS — R600 Localized edema: Secondary | ICD-10-CM | POA: Diagnosis not present

## 2020-09-18 DIAGNOSIS — C259 Malignant neoplasm of pancreas, unspecified: Secondary | ICD-10-CM | POA: Diagnosis not present

## 2020-09-18 DIAGNOSIS — I712 Thoracic aortic aneurysm, without rupture: Secondary | ICD-10-CM | POA: Diagnosis not present

## 2020-09-18 DIAGNOSIS — I495 Sick sinus syndrome: Secondary | ICD-10-CM | POA: Diagnosis not present

## 2020-09-18 DIAGNOSIS — I10 Essential (primary) hypertension: Secondary | ICD-10-CM | POA: Diagnosis not present

## 2020-09-18 DIAGNOSIS — Z0000 Encounter for general adult medical examination without abnormal findings: Secondary | ICD-10-CM | POA: Diagnosis not present

## 2020-09-18 DIAGNOSIS — R110 Nausea: Secondary | ICD-10-CM | POA: Diagnosis not present

## 2020-09-18 DIAGNOSIS — Z23 Encounter for immunization: Secondary | ICD-10-CM | POA: Diagnosis not present

## 2020-09-18 DIAGNOSIS — C787 Secondary malignant neoplasm of liver and intrahepatic bile duct: Secondary | ICD-10-CM | POA: Diagnosis not present

## 2020-09-20 DIAGNOSIS — C252 Malignant neoplasm of tail of pancreas: Secondary | ICD-10-CM | POA: Diagnosis not present

## 2020-09-20 DIAGNOSIS — C253 Malignant neoplasm of pancreatic duct: Secondary | ICD-10-CM | POA: Diagnosis not present

## 2020-09-20 DIAGNOSIS — I712 Thoracic aortic aneurysm, without rupture: Secondary | ICD-10-CM | POA: Diagnosis not present

## 2020-09-20 DIAGNOSIS — E785 Hyperlipidemia, unspecified: Secondary | ICD-10-CM | POA: Diagnosis not present

## 2020-09-20 DIAGNOSIS — I10 Essential (primary) hypertension: Secondary | ICD-10-CM | POA: Diagnosis not present

## 2020-09-20 DIAGNOSIS — K769 Liver disease, unspecified: Secondary | ICD-10-CM | POA: Diagnosis not present

## 2020-09-21 ENCOUNTER — Telehealth: Payer: Self-pay | Admitting: Oncology

## 2020-09-21 NOTE — Telephone Encounter (Signed)
Spoke with pt's daughter Bethena Roys. Pt requested to move appts on 10/13 to a later time but unable to due to availiability in infusion and MD. Pt's daughter is aware of appt times and date.

## 2020-09-23 ENCOUNTER — Other Ambulatory Visit: Payer: Self-pay | Admitting: Oncology

## 2020-09-25 ENCOUNTER — Telehealth: Payer: Self-pay | Admitting: Emergency Medicine

## 2020-09-25 ENCOUNTER — Telehealth: Payer: Self-pay | Admitting: Nurse Practitioner

## 2020-09-25 DIAGNOSIS — I10 Essential (primary) hypertension: Secondary | ICD-10-CM | POA: Diagnosis not present

## 2020-09-25 DIAGNOSIS — K769 Liver disease, unspecified: Secondary | ICD-10-CM | POA: Diagnosis not present

## 2020-09-25 DIAGNOSIS — C253 Malignant neoplasm of pancreatic duct: Secondary | ICD-10-CM | POA: Diagnosis not present

## 2020-09-25 DIAGNOSIS — I712 Thoracic aortic aneurysm, without rupture: Secondary | ICD-10-CM | POA: Diagnosis not present

## 2020-09-25 DIAGNOSIS — C252 Malignant neoplasm of tail of pancreas: Secondary | ICD-10-CM | POA: Diagnosis not present

## 2020-09-25 DIAGNOSIS — E785 Hyperlipidemia, unspecified: Secondary | ICD-10-CM | POA: Diagnosis not present

## 2020-09-25 NOTE — Telephone Encounter (Signed)
Received VM from pt's son Laverna Peace reporting increased fatigue in pt and requesting possible [facility] appt today/tomorrow to discuss pt's condition with PA Sandi Mealy.  Lucianne Lei on PAL, Burns Spain NP covering [facility], states she will contact pt's family by phone for more details.

## 2020-09-25 NOTE — Telephone Encounter (Signed)
Son called to ask about having his dad seen in [facility] due to fatigue.   I asked what was different about the fatigue that had him concerned; he stated his dad had gone off of prednisone around 10 days ago, and wondered if the decrease in appetite and energy could be due to the steroid withdrawal. I let him know, absolutely, some folks can tell a huge difference.   He stated with protein drinks his dad is getting at least 90 grams of protein a day. I think 90 grams a day is excellent for a patient in his condition.  I reviewed with him that his dad's weight has been largely stable with less than 10 lbs fluctuation over the past 3 years based on our records. I also discussed at length the role of calorically dense foods, and shakes/ smoothies and soups. He asked about various non-steroid appetite stimulants which are a possibility but do not change the fact that it seems his dad has some significant taste alterations. He understood.   He expressed concern about his dad's belly being more distended, and concern over some mild shortness of breath. He did state his dad is able to get up and move around freely without assistance, and can do all ADL's independently. He does feel like his dad is having more peripheral swelling and asked about whether diuretics or paracentesis would be appropriate. I did assure him that Dr. Benay Spice would be able to ascertain both of those things at his next visit but I could not assess for that over the phone. Offered to see patient tomorrow in the [facility], but also told him since he has an existing appt with Dr. Benay Spice and infusion on Wednesday it might be easier on patient and family to wait till Wednesday if their true goal is to spend time discussing concerns directly with Dr. Benay Spice.  I did also offer to see him on Tuesday in [facility] if they felt that his dad needed to be sooner than Wednesday. He agreed at end of call that he thinks he will wait till Wednesday, and was reminded if  something changes where they feel he needs to be seen sooner we do take walk ins. He voiced understanding. Emotional support offered.   Burns Spain, NP-C,  AOCNP

## 2020-09-26 ENCOUNTER — Other Ambulatory Visit: Payer: Self-pay

## 2020-09-26 ENCOUNTER — Other Ambulatory Visit: Payer: Self-pay | Admitting: *Deleted

## 2020-09-26 ENCOUNTER — Inpatient Hospital Stay (HOSPITAL_BASED_OUTPATIENT_CLINIC_OR_DEPARTMENT_OTHER): Payer: Medicare Other | Admitting: Nurse Practitioner

## 2020-09-26 ENCOUNTER — Other Ambulatory Visit: Payer: Self-pay | Admitting: Cardiovascular Disease

## 2020-09-26 ENCOUNTER — Inpatient Hospital Stay: Payer: Medicare Other | Attending: Oncology

## 2020-09-26 ENCOUNTER — Telehealth: Payer: Self-pay | Admitting: Nurse Practitioner

## 2020-09-26 ENCOUNTER — Ambulatory Visit (HOSPITAL_COMMUNITY)
Admission: RE | Admit: 2020-09-26 | Discharge: 2020-09-26 | Disposition: A | Discharge status: Home / Self Care | Payer: Medicare Other | Source: Ambulatory Visit | Attending: Nurse Practitioner | Admitting: Nurse Practitioner

## 2020-09-26 ENCOUNTER — Other Ambulatory Visit: Payer: Self-pay | Admitting: Nurse Practitioner

## 2020-09-26 VITALS — BP 136/88 | HR 84 | Temp 98.3°F | Resp 16 | Ht 66.0 in | Wt 171.4 lb

## 2020-09-26 DIAGNOSIS — I712 Thoracic aortic aneurysm, without rupture: Secondary | ICD-10-CM | POA: Diagnosis not present

## 2020-09-26 DIAGNOSIS — R600 Localized edema: Secondary | ICD-10-CM | POA: Diagnosis not present

## 2020-09-26 DIAGNOSIS — C252 Malignant neoplasm of tail of pancreas: Secondary | ICD-10-CM

## 2020-09-26 DIAGNOSIS — Z5111 Encounter for antineoplastic chemotherapy: Secondary | ICD-10-CM | POA: Diagnosis not present

## 2020-09-26 DIAGNOSIS — C787 Secondary malignant neoplasm of liver and intrahepatic bile duct: Secondary | ICD-10-CM | POA: Diagnosis not present

## 2020-09-26 DIAGNOSIS — Z950 Presence of cardiac pacemaker: Secondary | ICD-10-CM | POA: Diagnosis not present

## 2020-09-26 DIAGNOSIS — R188 Other ascites: Secondary | ICD-10-CM | POA: Diagnosis not present

## 2020-09-26 DIAGNOSIS — E785 Hyperlipidemia, unspecified: Secondary | ICD-10-CM | POA: Diagnosis not present

## 2020-09-26 DIAGNOSIS — Z95828 Presence of other vascular implants and grafts: Secondary | ICD-10-CM | POA: Diagnosis not present

## 2020-09-26 DIAGNOSIS — R140 Abdominal distension (gaseous): Secondary | ICD-10-CM | POA: Insufficient documentation

## 2020-09-26 DIAGNOSIS — Z452 Encounter for adjustment and management of vascular access device: Secondary | ICD-10-CM | POA: Diagnosis not present

## 2020-09-26 DIAGNOSIS — R0602 Shortness of breath: Secondary | ICD-10-CM

## 2020-09-26 DIAGNOSIS — Z7982 Long term (current) use of aspirin: Secondary | ICD-10-CM | POA: Diagnosis not present

## 2020-09-26 DIAGNOSIS — R5383 Other fatigue: Secondary | ICD-10-CM | POA: Diagnosis not present

## 2020-09-26 DIAGNOSIS — C253 Malignant neoplasm of pancreatic duct: Secondary | ICD-10-CM | POA: Diagnosis not present

## 2020-09-26 DIAGNOSIS — Z7952 Long term (current) use of systemic steroids: Secondary | ICD-10-CM | POA: Diagnosis not present

## 2020-09-26 DIAGNOSIS — I10 Essential (primary) hypertension: Secondary | ICD-10-CM | POA: Insufficient documentation

## 2020-09-26 DIAGNOSIS — K769 Liver disease, unspecified: Secondary | ICD-10-CM | POA: Diagnosis not present

## 2020-09-26 LAB — CBC WITH DIFFERENTIAL (CANCER CENTER ONLY)
Abs Immature Granulocytes: 0.07 10*3/uL (ref 0.00–0.07)
Basophils Absolute: 0.1 10*3/uL (ref 0.0–0.1)
Basophils Relative: 1 %
Eosinophils Absolute: 0.1 10*3/uL (ref 0.0–0.5)
Eosinophils Relative: 1 %
HCT: 36.6 % — ABNORMAL LOW (ref 39.0–52.0)
Hemoglobin: 11.8 g/dL — ABNORMAL LOW (ref 13.0–17.0)
Immature Granulocytes: 1 %
Lymphocytes Relative: 12 %
Lymphs Abs: 1.3 10*3/uL (ref 0.7–4.0)
MCH: 27.7 pg (ref 26.0–34.0)
MCHC: 32.2 g/dL (ref 30.0–36.0)
MCV: 85.9 fL (ref 80.0–100.0)
Monocytes Absolute: 1.3 10*3/uL — ABNORMAL HIGH (ref 0.1–1.0)
Monocytes Relative: 12 %
Neutro Abs: 8.1 10*3/uL — ABNORMAL HIGH (ref 1.7–7.7)
Neutrophils Relative %: 73 %
Platelet Count: 262 10*3/uL (ref 150–400)
RBC: 4.26 MIL/uL (ref 4.22–5.81)
RDW: 18.2 % — ABNORMAL HIGH (ref 11.5–15.5)
WBC Count: 10.9 10*3/uL — ABNORMAL HIGH (ref 4.0–10.5)
nRBC: 0 % (ref 0.0–0.2)

## 2020-09-26 LAB — CMP (CANCER CENTER ONLY)
ALT: 25 U/L (ref 0–44)
AST: 31 U/L (ref 15–41)
Albumin: 2.7 g/dL — ABNORMAL LOW (ref 3.5–5.0)
Alkaline Phosphatase: 141 U/L — ABNORMAL HIGH (ref 38–126)
Anion gap: 5 (ref 5–15)
BUN: 29 mg/dL — ABNORMAL HIGH (ref 8–23)
CO2: 27 mmol/L (ref 22–32)
Calcium: 9.2 mg/dL (ref 8.9–10.3)
Chloride: 106 mmol/L (ref 98–111)
Creatinine: 0.78 mg/dL (ref 0.61–1.24)
GFR, Estimated: >60 mL/min (ref >60)
Glucose, Bld: 105 mg/dL — ABNORMAL HIGH (ref 70–99)
Potassium: 3.4 mmol/L — ABNORMAL LOW (ref 3.5–5.1)
Sodium: 138 mmol/L (ref 135–145)
Total Bilirubin: 0.7 mg/dL (ref 0.3–1.2)
Total Protein: 7 g/dL (ref 6.5–8.1)

## 2020-09-26 MED ORDER — LIDOCAINE HCL 1 % IJ SOLN
INTRAMUSCULAR | Status: AC
  Filled 2020-09-26: qty 20

## 2020-09-26 MED FILL — Dexamethasone Sodium Phosphate Inj 100 MG/10ML: INTRAMUSCULAR | Qty: 1 | Status: AC

## 2020-09-26 NOTE — Progress Notes (Addendum)
[facility]pt[facility] Management Clinic Progress Note   Gary [name] [id number] 12-24-1931 86 y.o.  Gary [name] is managed by Dr. Julieanne Manson   Actively treated with chemotherapy/immunotherapy/hormonal therapy: yes  Current therapy: Gemzar, Paclitaxel   Last treated: 09/05/2020  Next scheduled appointment with provider: 10/02/2020  Assessment: Plan:    Cancer of pancreas, tail ([place]) - Plan: US Paracentesis  Abdominal distention - Plan: US Paracentesis  He will have abdominal ultrasound with paracentesis for both therapeutic and diagnostic purposes at 3:00 today.  Chemo - will hold chemo for now, and follow up OV with Dr. Benay [name] on Monday 10/02/2020 at 12:00 to review cytology from paracentesis, and discuss plan of care.  Please see After Visit Summary for patient specific instructions.  Future Appointments  Date Time Provider [place]  09/27/2020 11:00 AM [facility] LAB [facility] None  09/27/2020 11:15 AM [facility] FLUSH [facility] None  09/27/2020 11:45 AM [facility] INFUSION [facility] None  09/27/2020 12:30 PM Gary Pier, MD [facility] None  11/02/2020  3:20 PM Croitoru, Dani Gobble, MD [facility]    Orders Placed This Encounter  Procedures  . US Paracentesis       Subjective:   Patient ID:  Gary [name] is a 86 y.o. (DOB 1932/01/05) male.  Chief Complaint: No chief complaint on file.   HPI Gary [name] was seen today in the [facility] for increasing fatigue, mild shortness of breath, abdominal distention, swelling in his legs and decreasing appetite. He was accompanied by his son Gary [name]. Dr. Benay [name] examined him with me and discussed the benefits and implications of having an ultrasound guided abdomina paracentesis. The patient and son agree with this plan. Decision also made to cancel appts for chemo tomorrow. Dr. Benay [name] will discuss the cytology of the paracentesis with the patient and family on Monday, 10/18, 2021. Dr. Benay [name] briefly  reviewed with patient and family that if the abdominal fluid does show cancer that it would be most appropriate to stop chemotherapy, focus on comfort measures and consult hospice.   Medications: I have reviewed the patient's current medications.  Allergies:  Allergies  Allergen Reactions  . Gentamycin [Gentamicin Sulfate]     "Inner ears problems after Gent infusion" , also "unsteady mobility"  . Hydrocodone   . Aspirin Other (See Comments)    High doses cause stomach upset    Past Medical History:  Diagnosis Date  . Ascending aortic aneurysm ([place]) 01/13/2014   stable at 4.7 cm diameter since 2009  . Asystole x2, s/p pacemaker 01/13/2014  . Bradycardia    permanent pacemaker 11/22/08 St.Jude  . Cancer ([place])   . Family history of adverse reaction to anesthesia    brother died after anesthesia / nose polyp removal at [facility] (40 years ago)  . HTN (hypertension) 01/13/2014  . Hyperlipidemia   . Hypertension   . Pacemaker - leads 1987, last generator 2009 St. Jude dual-chamber 01/13/2014   Atrial lead model 433-01, ventricular lead model 431-01 implanted November 1987 Right ventricular lead dysfunction with low impedance and mediocre sensing but satisfactory pacing threshold  . Pacemaker lead malfunction 01/13/2014   Chronic low impedance and poor sensing of the ventricular lead    Past Surgical History:  Procedure Laterality Date  . IR IMAGING GUIDED PORT INSERTION  05/24/2020  . PERMANENT PACEMAKER GENERATOR CHANGE  11/22/08   ST. Jude  . PERMANENT PACEMAKER INSERTION  03/04/97   ST. Jude  . US ECHOCARDIOGRAPHY  05/16/11   AS mild to mod.,TR mild  to mod., EF => 55%.    Family History  Problem Relation Age of Onset  . Cancer Mother   . Heart attack Father     Social History   Socioeconomic History  . Marital status: Married    Spouse name: Not on file  . Number of children: Not on file  . Years of education: Not on file  . Highest education level: Not on file  Occupational  History  . Not on file  Tobacco Use  . Smoking status: Never Smoker  . Smokeless tobacco: Never Used  Vaping Use  . Vaping Use: Never used  Substance and Sexual Activity  . Alcohol use: Yes    Comment: occas.  . Drug use: No  . Sexual activity: Not on file  Other Topics Concern  . Not on file  Social History Narrative  . Not on file   Social Determinants of Health   Financial Resource Strain:   . Difficulty of Paying Living Expenses: Not on file  Food Insecurity:   . Worried About Charity fundraiser in the Last Year: Not on file  . Ran Out of Food in the Last Year: Not on file  Transportation Needs:   . Lack of Transportation (Medical): Not on file  . Lack of Transportation (Non-Medical): Not on file  Physical Activity:   . Days of Exercise per Week: Not on file  . Minutes of Exercise per Session: Not on file  Stress:   . Feeling of Stress : Not on file  Social Connections:   . Frequency of Communication with Friends and Family: Not on file  . Frequency of Social Gatherings with Friends and Family: Not on file  . Attends Religious Services: Not on file  . Active Member of Clubs or Organizations: Not on file  . Attends Archivist Meetings: Not on file  . Marital Status: Not on file  Intimate Partner Violence:   . Fear of Current or Ex-Partner: Not on file  . Emotionally Abused: Not on file  . Physically Abused: Not on file  . Sexually Abused: Not on file    Past Medical History, Surgical history, Social history, and Family history were reviewed and updated as appropriate.   Please see review of systems for further details on the patient's review from today.   Review of Systems:  Review of Systems :  Respiratory: some shortness of breath with mild exertion Cardiac: occasional episodes where he feels like heart is beating fast; swelling noted bilaterally to legs Abd: Abdominal distention, early satiety, decreased appetitte General: Fatigue  Objective:     Physical Exam:  BP 136/88 (BP Location: Left Arm, Patient Position: Sitting)   Pulse 84   Temp 98.3 F (36.8 C) (Tympanic)   Resp 16   Ht 5\' 6"  (1.676 m)   Wt 171 lb 6.4 oz (77.7 kg)   SpO2 97%   BMI 27.66 kg/m  ECOG: 2-3 - presents today in wheelchair  Physical Exam Constitutional:      Appearance: He is ill-appearing.  HENT:     Head: Normocephalic.  Cardiovascular:     Rate and Rhythm: Normal rate and regular rhythm.     Pulses: Normal pulses.  Abdominal:     General: There is distension.  Musculoskeletal:        General: Swelling present.     Cervical back: Normal range of motion.     Comments: +2 to +3 pitting edema noted bilaterally to lower legs and  feet, extends up to knees. Swelling noted in abdomen; no swelling in arms noted  Neurological:     Mental Status: He is oriented to person, place, and time.     Lab Review:     Component Value Date/Time   NA 138 09/26/2020 1138   NA 142 03/23/2020 1428   K 3.4 (L) 09/26/2020 1138   CL 106 09/26/2020 1138   CO2 27 09/26/2020 1138   GLUCOSE 105 (H) 09/26/2020 1138   BUN 29 (H) 09/26/2020 1138   BUN 18 03/23/2020 1428   CREATININE 0.78 09/26/2020 1138   CREATININE 0.99 07/10/2016 1043   CALCIUM 9.2 09/26/2020 1138   PROT 7.0 09/26/2020 1138   ALBUMIN 2.7 (L) 09/26/2020 1138   AST 31 09/26/2020 1138   ALT 25 09/26/2020 1138   ALKPHOS 141 (H) 09/26/2020 1138   BILITOT 0.7 09/26/2020 1138   GFRNONAA >60 09/26/2020 1138   GFRAA >60 09/05/2020 1222       Component Value Date/Time   WBC 10.9 (H) 09/26/2020 1138   WBC 15.2 (H) 05/28/2020 1759   RBC 4.26 09/26/2020 1138   HGB 11.8 (L) 09/26/2020 1138   HCT 36.6 (L) 09/26/2020 1138   PLT 262 09/26/2020 1138   MCV 85.9 09/26/2020 1138   MCH 27.7 09/26/2020 1138   MCHC 32.2 09/26/2020 1138   RDW 18.2 (H) 09/26/2020 1138   LYMPHSABS 1.3 09/26/2020 1138   MONOABS 1.3 (H) 09/26/2020 1138   EOSABS 0.1 09/26/2020 1138   BASOSABS 0.1 09/26/2020 1138    -------------------------------  Imaging from last 24 hours (if applicable):  Radiology interpretation: No results found.     Seen in collaboration with Dr. Bernette Redbird, NP-C, AOCNP This was a shared visit with Burns Spain.  Mr. Rothert was interviewed and examined.  He has increased abdominal distention, respiratory distress, and malaise.  He appears to have ascites.  Chemotherapy will be placed on hold.  He will be referred for a diagnostic/therapeutic paracentesis.  His son was present for today's visit.  We discussed the plan to recommend comfort care if he is confirmed to have malignant ascites.  Julieanne Manson, MD

## 2020-09-26 NOTE — Telephone Encounter (Signed)
I let son know that I talked with both Ned Card, NP and Dr. Benay Spice this morning and that if they want his dad to be seen they could bring him in this morning to the [facility] and we would be able to get Dr. Benay Spice to see him as long as they arrived before noon. He is going to discuss that option with his parents and make a decision. Emotional support offered.

## 2020-09-26 NOTE — Procedures (Signed)
Ultrasound-guided diagnostic and therapeutic paracentesis performed yielding 4.5 liters of yellow fluid. No immediate complications. A portion of the fluid was sent to the lab for cytology. EBL none.

## 2020-09-27 ENCOUNTER — Inpatient Hospital Stay: Payer: Medicare Other

## 2020-09-27 ENCOUNTER — Other Ambulatory Visit: Payer: Medicare Other

## 2020-09-27 ENCOUNTER — Ambulatory Visit: Payer: Medicare Other | Admitting: Oncology

## 2020-09-27 ENCOUNTER — Inpatient Hospital Stay: Payer: Medicare Other | Admitting: Oncology

## 2020-09-27 LAB — CANCER ANTIGEN 19-9: CA 19-9: 9453 U/mL — ABNORMAL HIGH (ref 0–35)

## 2020-09-29 ENCOUNTER — Telehealth: Payer: Self-pay | Admitting: Oncology

## 2020-09-29 DIAGNOSIS — C252 Malignant neoplasm of tail of pancreas: Secondary | ICD-10-CM | POA: Diagnosis not present

## 2020-09-29 DIAGNOSIS — I712 Thoracic aortic aneurysm, without rupture: Secondary | ICD-10-CM | POA: Diagnosis not present

## 2020-09-29 DIAGNOSIS — K769 Liver disease, unspecified: Secondary | ICD-10-CM | POA: Diagnosis not present

## 2020-09-29 DIAGNOSIS — C253 Malignant neoplasm of pancreatic duct: Secondary | ICD-10-CM | POA: Diagnosis not present

## 2020-09-29 DIAGNOSIS — E785 Hyperlipidemia, unspecified: Secondary | ICD-10-CM | POA: Diagnosis not present

## 2020-09-29 DIAGNOSIS — I10 Essential (primary) hypertension: Secondary | ICD-10-CM | POA: Diagnosis not present

## 2020-09-29 LAB — CYTOLOGY - NON PAP

## 2020-09-29 NOTE — Telephone Encounter (Signed)
Scheduled per los, patient has been called and notified. 

## 2020-10-02 ENCOUNTER — Other Ambulatory Visit: Payer: Self-pay

## 2020-10-02 ENCOUNTER — Inpatient Hospital Stay (HOSPITAL_BASED_OUTPATIENT_CLINIC_OR_DEPARTMENT_OTHER): Payer: Medicare Other | Admitting: Oncology

## 2020-10-02 VITALS — BP 134/87 | HR 86 | Temp 97.8°F | Resp 18 | Ht 66.0 in | Wt 162.2 lb

## 2020-10-02 DIAGNOSIS — C787 Secondary malignant neoplasm of liver and intrahepatic bile duct: Secondary | ICD-10-CM | POA: Diagnosis not present

## 2020-10-02 DIAGNOSIS — C252 Malignant neoplasm of tail of pancreas: Secondary | ICD-10-CM | POA: Diagnosis not present

## 2020-10-02 DIAGNOSIS — Z5111 Encounter for antineoplastic chemotherapy: Secondary | ICD-10-CM | POA: Diagnosis not present

## 2020-10-02 DIAGNOSIS — R188 Other ascites: Secondary | ICD-10-CM | POA: Diagnosis not present

## 2020-10-02 DIAGNOSIS — R140 Abdominal distension (gaseous): Secondary | ICD-10-CM | POA: Diagnosis not present

## 2020-10-02 DIAGNOSIS — Z452 Encounter for adjustment and management of vascular access device: Secondary | ICD-10-CM | POA: Diagnosis not present

## 2020-10-02 NOTE — Progress Notes (Signed)
[place] OFFICE PROGRESS NOTE   Diagnosis: Pancreas cancer  INTERVAL HISTORY:   Gary [name] returns as scheduled.  He is here with his son.  He reports improvement in dyspnea following the paracentesis procedure last week.  No pain.  His appetite is adequate.  Leg edema has improved with support stockings.  Objective:  Vital signs in last 24 hours:  Blood pressure 134/87, pulse 86, temperature 97.8 F (36.6 C), temperature source Tympanic, resp. rate 18, height 5\' 6"  (1.676 m), weight 162 lb 3.2 oz (73.6 kg), SpO2 100 %.    Resp: Lungs clear bilaterally Cardio: Regular rate and rhythm GI: Mildly distended, no mass, no hepatomegaly, nontender Vascular: Trace edema at the lower leg bilaterally    Portacath/PICC-without erythema  Lab Results:  Lab Results  Component Value Date   WBC 10.9 (H) 09/26/2020   HGB 11.8 (L) 09/26/2020   HCT 36.6 (L) 09/26/2020   MCV 85.9 09/26/2020   PLT 262 09/26/2020   NEUTROABS 8.1 (H) 09/26/2020    CMP  Lab Results  Component Value Date   NA 138 09/26/2020   K 3.4 (L) 09/26/2020   CL 106 09/26/2020   CO2 27 09/26/2020   GLUCOSE 105 (H) 09/26/2020   BUN 29 (H) 09/26/2020   CREATININE 0.78 09/26/2020   CALCIUM 9.2 09/26/2020   PROT 7.0 09/26/2020   ALBUMIN 2.7 (L) 09/26/2020   AST 31 09/26/2020   ALT 25 09/26/2020   ALKPHOS 141 (H) 09/26/2020   BILITOT 0.7 09/26/2020   GFRNONAA >60 09/26/2020   GFRAA >60 09/05/2020    Medications: I have reviewed the patient's current medications.   Assessment/Plan: 1. Pancreas tail lesion, multiple liver lesions   Chest CT 04/12/2020-ascending thoracic aortic aneurysm; hypovascular lesion tail of the pancreas, new compared to the prior examination. Multiple poorly defined hypovascular lesions scattered throughout the liver   CT abdomen/pelvis 05/02/2020-hypoenhancing lesion of the pancreatic tail measuring approximately 2.4 x 1.9 cm; enlarged portacaval lymph nodes  measuring up to 1.9 x 1.4 cm; numerous hypodense lesions throughout the liver, index lesion of the liver dome measuring 1.7 x 1.2 cm.  Ultrasound-guided biopsy of a left liver lesion 05/10/2020-poorly differentiated carcinoma consistent with a pancreatobiliary primary  Markedly elevated CA 19-9  Cycle 1day 1gemcitabine/Abraxane 05/26/2020  Cycle1 day 15gemcitabine/Abraxane 06/12/2020  Cycle2 day 1gemcitabine/Abraxane 07/04/2020(chemotherapy doses reduced, antiemetic regimen adjusted)  Cycle2 day 15Gemcitabine/Abraxane 07/18/2020(continue same dose reduction, reduce post-treatment steroids due to insomnia)  Cycle 3 day 1 gem/abraxane 08/02/20  Cycle 3 day 15 gem/Abraxane 08/15/2020  CTs abdomen/pelvis 08/25/2020-pancreas tail mass and portacaval lymphadenopathy decreased.  Liver metastases reported as increased, review requested.  Cycle 4-day 1 gemcitabine/Abraxane 09/05/2020  Paracentesis 09/26/2020-reactive mesothelial cells  2. Thoracic aortic aneurysm 3. Hypertension 4. Hyperlipidemia 5. History of bradycardia status post permanent pacemaker 6. Mother hadcervicalcancer 7. Pitting lower leg edema 8. GERD, on omeprazole    Disposition: Gary [name] has an improved performance status compared to when he was here on 09/26/2020.  His dyspnea improved following the paracentesis.  The ascitic fluid cytology was negative for malignancy.  Gary [name] understands the negative cytology does not exclude a diagnosis of carcinomatosis.  I discussed treatment options with Gary [name] and his son.  We discussed comfort care versus continuing gemcitabine/Abraxane.  There is no clear evidence of disease progression and the CA 19-9 was lower on 09/26/2020.  He would like to continue chemotherapy.  He will return for another treatment on 10/11/2020.  Gary [name] will undergo a  restaging CT evaluation within the next 4-6 weeks.  Betsy Coder, MD  10/02/2020  12:59 PM

## 2020-10-04 ENCOUNTER — Telehealth: Payer: Self-pay | Admitting: Nurse Practitioner

## 2020-10-04 DIAGNOSIS — C253 Malignant neoplasm of pancreatic duct: Secondary | ICD-10-CM | POA: Diagnosis not present

## 2020-10-04 DIAGNOSIS — I10 Essential (primary) hypertension: Secondary | ICD-10-CM | POA: Diagnosis not present

## 2020-10-04 DIAGNOSIS — C252 Malignant neoplasm of tail of pancreas: Secondary | ICD-10-CM | POA: Diagnosis not present

## 2020-10-04 DIAGNOSIS — I712 Thoracic aortic aneurysm, without rupture: Secondary | ICD-10-CM | POA: Diagnosis not present

## 2020-10-04 DIAGNOSIS — E785 Hyperlipidemia, unspecified: Secondary | ICD-10-CM | POA: Diagnosis not present

## 2020-10-04 DIAGNOSIS — K769 Liver disease, unspecified: Secondary | ICD-10-CM | POA: Diagnosis not present

## 2020-10-04 NOTE — Telephone Encounter (Signed)
Appointments scheduled per 10/18 los. Spoke to patient's daughter who is aware of appointments dates and times. Okay to decouple appointments per Ned Card.

## 2020-10-06 DIAGNOSIS — C253 Malignant neoplasm of pancreatic duct: Secondary | ICD-10-CM | POA: Diagnosis not present

## 2020-10-06 DIAGNOSIS — K769 Liver disease, unspecified: Secondary | ICD-10-CM | POA: Diagnosis not present

## 2020-10-06 DIAGNOSIS — I712 Thoracic aortic aneurysm, without rupture: Secondary | ICD-10-CM | POA: Diagnosis not present

## 2020-10-06 DIAGNOSIS — E785 Hyperlipidemia, unspecified: Secondary | ICD-10-CM | POA: Diagnosis not present

## 2020-10-06 DIAGNOSIS — I10 Essential (primary) hypertension: Secondary | ICD-10-CM | POA: Diagnosis not present

## 2020-10-06 DIAGNOSIS — C252 Malignant neoplasm of tail of pancreas: Secondary | ICD-10-CM | POA: Diagnosis not present

## 2020-10-08 ENCOUNTER — Other Ambulatory Visit: Payer: Self-pay | Admitting: Oncology

## 2020-10-09 ENCOUNTER — Other Ambulatory Visit: Payer: Self-pay | Admitting: *Deleted

## 2020-10-09 ENCOUNTER — Other Ambulatory Visit: Payer: Self-pay

## 2020-10-09 ENCOUNTER — Ambulatory Visit (HOSPITAL_COMMUNITY)
Admission: RE | Admit: 2020-10-09 | Discharge: 2020-10-09 | Disposition: A | Discharge status: Home / Self Care | Payer: Medicare Other | Source: Ambulatory Visit | Attending: Oncology | Admitting: Oncology

## 2020-10-09 DIAGNOSIS — R188 Other ascites: Secondary | ICD-10-CM | POA: Diagnosis not present

## 2020-10-09 DIAGNOSIS — C252 Malignant neoplasm of tail of pancreas: Secondary | ICD-10-CM | POA: Diagnosis not present

## 2020-10-09 MED ORDER — LIDOCAINE HCL 1 % IJ SOLN
INTRAMUSCULAR | Status: AC
  Filled 2020-10-09: qty 20

## 2020-10-09 NOTE — Progress Notes (Signed)
Gary [name] had left message requesting paracentesis today. OK per Dr. Benay Spice. Scheduled for [facility] at 1445/1500--she has been notified.

## 2020-10-09 NOTE — Procedures (Signed)
  PROCEDURE SUMMARY:  Successful US guided paracentesis from RLQ.  Yielded 4L of clear yellow fluid.  No immediate complications.  Pt tolerated well.   Specimen was sent for labs.  EBL < 55mL  Ascencion Dike PA-C 10/09/2020 4:41 PM

## 2020-10-10 ENCOUNTER — Telehealth: Payer: Self-pay | Admitting: *Deleted

## 2020-10-10 LAB — CYTOLOGY - NON PAP

## 2020-10-10 NOTE — Telephone Encounter (Signed)
Call from PT w/[facility] to inform office that patient has requested an end to physical therapy, so he will be discharged from services.

## 2020-10-11 ENCOUNTER — Inpatient Hospital Stay: Payer: Medicare Other

## 2020-10-11 ENCOUNTER — Other Ambulatory Visit: Payer: Self-pay

## 2020-10-11 ENCOUNTER — Inpatient Hospital Stay (HOSPITAL_BASED_OUTPATIENT_CLINIC_OR_DEPARTMENT_OTHER): Payer: Medicare Other | Admitting: Nurse Practitioner

## 2020-10-11 ENCOUNTER — Telehealth: Payer: Self-pay | Admitting: Nurse Practitioner

## 2020-10-11 ENCOUNTER — Encounter: Payer: Self-pay | Admitting: Nurse Practitioner

## 2020-10-11 VITALS — BP 129/72 | HR 68 | Temp 97.0°F | Resp 16 | Ht 66.0 in | Wt 157.0 lb

## 2020-10-11 DIAGNOSIS — C787 Secondary malignant neoplasm of liver and intrahepatic bile duct: Secondary | ICD-10-CM | POA: Diagnosis not present

## 2020-10-11 DIAGNOSIS — C252 Malignant neoplasm of tail of pancreas: Secondary | ICD-10-CM

## 2020-10-11 DIAGNOSIS — R140 Abdominal distension (gaseous): Secondary | ICD-10-CM | POA: Diagnosis not present

## 2020-10-11 DIAGNOSIS — Z452 Encounter for adjustment and management of vascular access device: Secondary | ICD-10-CM | POA: Diagnosis not present

## 2020-10-11 DIAGNOSIS — Z95828 Presence of other vascular implants and grafts: Secondary | ICD-10-CM

## 2020-10-11 DIAGNOSIS — Z5111 Encounter for antineoplastic chemotherapy: Secondary | ICD-10-CM | POA: Diagnosis not present

## 2020-10-11 DIAGNOSIS — R188 Other ascites: Secondary | ICD-10-CM | POA: Diagnosis not present

## 2020-10-11 LAB — CBC WITH DIFFERENTIAL (CANCER CENTER ONLY)
Abs Immature Granulocytes: 0.05 10*3/uL (ref 0.00–0.07)
Basophils Absolute: 0 10*3/uL (ref 0.0–0.1)
Basophils Relative: 0 %
Eosinophils Absolute: 0.2 10*3/uL (ref 0.0–0.5)
Eosinophils Relative: 2 %
HCT: 38.3 % — ABNORMAL LOW (ref 39.0–52.0)
Hemoglobin: 12.3 g/dL — ABNORMAL LOW (ref 13.0–17.0)
Immature Granulocytes: 1 %
Lymphocytes Relative: 16 %
Lymphs Abs: 1.7 10*3/uL (ref 0.7–4.0)
MCH: 27.3 pg (ref 26.0–34.0)
MCHC: 32.1 g/dL (ref 30.0–36.0)
MCV: 84.9 fL (ref 80.0–100.0)
Monocytes Absolute: 0.9 10*3/uL (ref 0.1–1.0)
Monocytes Relative: 8 %
Neutro Abs: 8 10*3/uL — ABNORMAL HIGH (ref 1.7–7.7)
Neutrophils Relative %: 73 %
Platelet Count: 237 10*3/uL (ref 150–400)
RBC: 4.51 MIL/uL (ref 4.22–5.81)
RDW: 18.6 % — ABNORMAL HIGH (ref 11.5–15.5)
WBC Count: 10.9 10*3/uL — ABNORMAL HIGH (ref 4.0–10.5)
nRBC: 0 % (ref 0.0–0.2)

## 2020-10-11 LAB — CMP (CANCER CENTER ONLY)
ALT: 27 U/L (ref 0–44)
AST: 35 U/L (ref 15–41)
Albumin: 2.5 g/dL — ABNORMAL LOW (ref 3.5–5.0)
Alkaline Phosphatase: 146 U/L — ABNORMAL HIGH (ref 38–126)
Anion gap: 6 (ref 5–15)
BUN: 20 mg/dL (ref 8–23)
CO2: 26 mmol/L (ref 22–32)
Calcium: 8.7 mg/dL — ABNORMAL LOW (ref 8.9–10.3)
Chloride: 106 mmol/L (ref 98–111)
Creatinine: 0.8 mg/dL (ref 0.61–1.24)
GFR, Estimated: >60 mL/min (ref >60)
Glucose, Bld: 96 mg/dL (ref 70–99)
Potassium: 4.1 mmol/L (ref 3.5–5.1)
Sodium: 138 mmol/L (ref 135–145)
Total Bilirubin: 0.9 mg/dL (ref 0.3–1.2)
Total Protein: 6.6 g/dL (ref 6.5–8.1)

## 2020-10-11 MED ORDER — HEPARIN SOD (PORK) LOCK FLUSH 100 UNIT/ML IV SOLN
500.0000 [IU] | Freq: Once | INTRAVENOUS | Status: AC | PRN
  Administered 2020-10-11: 500 [IU]
  Filled 2020-10-11: qty 5

## 2020-10-11 MED ORDER — SODIUM CHLORIDE 0.9% FLUSH
10.0000 mL | INTRAVENOUS | Status: DC | PRN
  Administered 2020-10-11: 10 mL
  Filled 2020-10-11: qty 10

## 2020-10-11 NOTE — Patient Instructions (Signed)

## 2020-10-11 NOTE — Telephone Encounter (Signed)
Scheduled appointment per 10/27 los. Spoke to patient who is aware of appointment date and time.

## 2020-10-11 NOTE — Progress Notes (Signed)
[place] OFFICE PROGRESS NOTE   Diagnosis: Pancreas cancer  INTERVAL HISTORY:   [name] returns as scheduled.  He underwent a paracentesis 10/09/2020 with 4 L of fluid removed.  He notes improvement in abdominal distention.  He denies abdominal pain.  Bowels are moving.  He takes prune juice if needed.  No nausea or vomiting.  Overall good appetite.  Objective:  Vital signs in last 24 hours:  Blood pressure 129/72, pulse 68, temperature (!) 97 F (36.1 C), temperature source Tympanic, resp. rate 16, height 5\' 6"  (1.676 m), weight 157 lb (71.2 kg).    HEENT: No thrush or ulcers. Resp: Lungs clear bilaterally. Cardio: Regular rate and rhythm. GI: Abdomen is mildly distended.  No hepatomegaly. Vascular: Trace pitting edema at the lower legs bilaterally.  Port-A-Cath without erythema.  Lab Results:  Lab Results  Component Value Date   WBC 10.9 (H) 10/11/2020   HGB 12.3 (L) 10/11/2020   HCT 38.3 (L) 10/11/2020   MCV 84.9 10/11/2020   PLT 237 10/11/2020   NEUTROABS 8.0 (H) 10/11/2020    Imaging:  US Paracentesis  Result Date: 10/09/2020 INDICATION: History of pancreatic cancer. Abdominal distention and recurrent ascites. Request for diagnostic and therapeutic paracentesis. EXAM: ULTRASOUND GUIDED RIGHT LOWER QUADRANT PARACENTESIS MEDICATIONS: None. COMPLICATIONS: None immediate. PROCEDURE: Informed written consent was obtained from the patient after a discussion of the risks, benefits and alternatives to treatment. A timeout was performed prior to the initiation of the procedure. Initial ultrasound scanning demonstrates a large amount of ascites within the right lower abdominal quadrant. The right lower abdomen was prepped and draped in the usual sterile fashion. 1% lidocaine was used for local anesthesia. Following this, a 19 gauge, 7-cm, Yueh catheter was introduced. An ultrasound image was saved for documentation purposes. The paracentesis was performed.  The catheter was removed and a dressing was applied. The patient tolerated the procedure well without immediate post procedural complication. FINDINGS: A total of approximately 4 L of clear yellow fluid was removed. Samples were sent to the laboratory as requested by the clinical team. IMPRESSION: Successful ultrasound-guided paracentesis yielding 4 liters of peritoneal fluid. Read by: Ascencion Dike PA-C Electronically Signed   By: Ruthann Cancer MD   On: 10/09/2020 16:45    Medications: I have reviewed the patient's current medications.  Assessment/Plan: 1. Pancreas tail lesion, multiple liver lesions   Chest CT 04/12/2020-ascending thoracic aortic aneurysm; hypovascular lesion tail of the pancreas, new compared to the prior examination. Multiple poorly defined hypovascular lesions scattered throughout the liver   CT abdomen/pelvis 05/02/2020-hypoenhancing lesion of the pancreatic tail measuring approximately 2.4 x 1.9 cm; enlarged portacaval lymph nodes measuring up to 1.9 x 1.4 cm; numerous hypodense lesions throughout the liver, index lesion of the liver dome measuring 1.7 x 1.2 cm.  Ultrasound-guided biopsy of a left liver lesion 05/10/2020-poorly differentiated carcinoma consistent with a pancreatobiliary primary  Markedly elevated CA 19-9  Cycle 1day 1gemcitabine/Abraxane 05/26/2020  Cycle1 day 15gemcitabine/Abraxane 06/12/2020  Cycle2 day 1gemcitabine/Abraxane 07/04/2020(chemotherapy doses reduced, antiemetic regimen adjusted)  Cycle2 day 15Gemcitabine/Abraxane 07/18/2020(continue same dose reduction, reduce post-treatment steroids due to insomnia)  Cycle 3 day 1 gem/abraxane 08/02/20  Cycle 3 day 15 gem/Abraxane 08/15/2020  CTs abdomen/pelvis 08/25/2020-pancreas tail mass and portacaval lymphadenopathy decreased.  Liver metastases reported as increased, review requested.  Cycle 4-day 1 gemcitabine/Abraxane 09/05/2020  Paracentesis 09/26/2020-reactive mesothelial  cells  Paracentesis 10/09/2020-reactive mesothelial cells  Cycle 5 gemcitabine/Abraxane 10/12/2020  2. Thoracic aortic aneurysm 3. Hypertension 4. Hyperlipidemia 5. History  of bradycardia status post permanent pacemaker 6. Mother hadcervicalcancer 7. Pitting lower leg edema 8. GERD, on omeprazole  Disposition: Gary [name] appears stable.  He is scheduled to resume treatment with gemcitabine/Abraxane 10/12/2020.  We reviewed the CBC from today.  Counts adequate to proceed.  He will contact the office with progressive abdominal distention/need for paracentesis.  He will return for lab, follow-up, gemcitabine/Abraxane in 3 weeks.  He will contact the office in the interim as outlined above or with any other problems.    Ned Card ANP/GNP-BC   10/11/2020  2:07 PM

## 2020-10-12 ENCOUNTER — Other Ambulatory Visit: Payer: Self-pay

## 2020-10-12 ENCOUNTER — Inpatient Hospital Stay: Payer: Medicare Other

## 2020-10-12 VITALS — BP 120/70 | HR 72 | Temp 97.8°F | Resp 18

## 2020-10-12 DIAGNOSIS — C252 Malignant neoplasm of tail of pancreas: Secondary | ICD-10-CM | POA: Diagnosis not present

## 2020-10-12 DIAGNOSIS — Z5111 Encounter for antineoplastic chemotherapy: Secondary | ICD-10-CM | POA: Diagnosis not present

## 2020-10-12 DIAGNOSIS — R188 Other ascites: Secondary | ICD-10-CM | POA: Diagnosis not present

## 2020-10-12 DIAGNOSIS — C787 Secondary malignant neoplasm of liver and intrahepatic bile duct: Secondary | ICD-10-CM | POA: Diagnosis not present

## 2020-10-12 DIAGNOSIS — Z452 Encounter for adjustment and management of vascular access device: Secondary | ICD-10-CM | POA: Diagnosis not present

## 2020-10-12 DIAGNOSIS — R140 Abdominal distension (gaseous): Secondary | ICD-10-CM | POA: Diagnosis not present

## 2020-10-12 LAB — CANCER ANTIGEN 19-9: CA 19-9: 6715 U/mL — ABNORMAL HIGH (ref 0–35)

## 2020-10-12 MED ORDER — ONDANSETRON HCL 4 MG/2ML IJ SOLN
INTRAMUSCULAR | Status: AC
  Filled 2020-10-12: qty 4

## 2020-10-12 MED ORDER — PACLITAXEL PROTEIN-BOUND CHEMO INJECTION 100 MG
75.0000 mg/m2 | Freq: Once | INTRAVENOUS | Status: AC
  Administered 2020-10-12: 150 mg via INTRAVENOUS
  Filled 2020-10-12: qty 30

## 2020-10-12 MED ORDER — SODIUM CHLORIDE 0.9 % IV SOLN
800.0000 mg/m2 | Freq: Once | INTRAVENOUS | Status: AC
  Administered 2020-10-12: 1520 mg via INTRAVENOUS
  Filled 2020-10-12: qty 39.98

## 2020-10-12 MED ORDER — HEPARIN SOD (PORK) LOCK FLUSH 100 UNIT/ML IV SOLN
500.0000 [IU] | Freq: Once | INTRAVENOUS | Status: AC | PRN
  Administered 2020-10-12: 500 [IU]
  Filled 2020-10-12: qty 5

## 2020-10-12 MED ORDER — ONDANSETRON HCL 4 MG/2ML IJ SOLN
8.0000 mg | Freq: Once | INTRAMUSCULAR | Status: AC
  Administered 2020-10-12: 8 mg via INTRAVENOUS

## 2020-10-12 MED ORDER — SODIUM CHLORIDE 0.9 % IV SOLN
Freq: Once | INTRAVENOUS | Status: AC
  Filled 2020-10-12: qty 250

## 2020-10-12 MED ORDER — SODIUM CHLORIDE 0.9 % IV SOLN
10.0000 mg | Freq: Once | INTRAVENOUS | Status: DC
  Filled 2020-10-12: qty 1

## 2020-10-12 MED ORDER — SODIUM CHLORIDE 0.9% FLUSH
10.0000 mL | INTRAVENOUS | Status: DC | PRN
  Administered 2020-10-12: 10 mL
  Filled 2020-10-12: qty 10

## 2020-10-12 MED ORDER — SODIUM CHLORIDE 0.9 % IV SOLN
10.0000 mg | Freq: Once | INTRAVENOUS | Status: AC
  Administered 2020-10-12: 10 mg via INTRAVENOUS
  Filled 2020-10-12: qty 1

## 2020-10-12 NOTE — Patient Instructions (Signed)
[facility] Discharge Instructions for Patients Receiving Chemotherapy  Today you received the following chemotherapy agents gemzar, abraxane  To help prevent nausea and vomiting after your treatment, we encourage you to take your nausea medication as directed.   If you develop nausea and vomiting that is not controlled by your nausea medication, call the clinic.   BELOW ARE SYMPTOMS THAT SHOULD BE REPORTED IMMEDIATELY:  *FEVER GREATER THAN 100.5 F  *CHILLS WITH OR WITHOUT FEVER  NAUSEA AND VOMITING THAT IS NOT CONTROLLED WITH YOUR NAUSEA MEDICATION  *UNUSUAL SHORTNESS OF BREATH  *UNUSUAL BRUISING OR BLEEDING  TENDERNESS IN MOUTH AND THROAT WITH OR WITHOUT PRESENCE OF ULCERS  *URINARY PROBLEMS  *BOWEL PROBLEMS  UNUSUAL RASH Items with * indicate a potential emergency and should be followed up as soon as possible.  Feel free to call the clinic should you have any questions or concerns. The clinic phone number is [phone].  Please show the CHEMO ALERT CARD at check-in to the Emergency Department and triage nurse.   

## 2020-10-13 ENCOUNTER — Ambulatory Visit (HOSPITAL_COMMUNITY): Payer: Medicare Other

## 2020-10-13 ENCOUNTER — Telehealth: Payer: Self-pay | Admitting: Oncology

## 2020-10-13 ENCOUNTER — Telehealth: Payer: Self-pay | Admitting: *Deleted

## 2020-10-13 DIAGNOSIS — C252 Malignant neoplasm of tail of pancreas: Secondary | ICD-10-CM

## 2020-10-13 NOTE — Telephone Encounter (Signed)
Error

## 2020-10-13 NOTE — Telephone Encounter (Signed)
Rescheduled appointments per 10/28 Inbasket msg from [place]. Spoke to patient's wife who is aware of appointment change. Let her know Dr. Benay Spice needs to see Gary [name], but the only time he has is 11:30 which is why Gary [name] appointments start at 86 that day.

## 2020-10-19 ENCOUNTER — Telehealth: Payer: Self-pay | Admitting: *Deleted

## 2020-10-19 DIAGNOSIS — C252 Malignant neoplasm of tail of pancreas: Secondary | ICD-10-CM

## 2020-10-19 NOTE — Progress Notes (Signed)
Patient's daughter Gary [name] calls back saying the her father doesn't want to have paracentesis tomorrow after all desiring to wait until next Thursday.  I told her based on the information she provided earlier today he was "miserable" and needed to have this done.  She states that her brother talked to him and apparently he isn't as miserable as she thought.  I suggested to her that in the future she actually ask her father versus assuming this needs to be done.  She was given the number to Central Scheduling to cancel the appointment tomorrow and reschedule to what date and time they desire.

## 2020-10-19 NOTE — Telephone Encounter (Signed)
Daughter calling to request paracentesis appointment for patient. OK per Dr. Benay Spice. Scheduled for 10/20/20 at 0900--arrive at 0845 at [facility]. Daughter notified.

## 2020-10-20 ENCOUNTER — Ambulatory Visit (HOSPITAL_COMMUNITY): Payer: Medicare Other

## 2020-10-23 ENCOUNTER — Inpatient Hospital Stay: Payer: Medicare Other | Attending: Oncology | Admitting: Medical

## 2020-10-23 ENCOUNTER — Inpatient Hospital Stay: Payer: Medicare Other

## 2020-10-23 ENCOUNTER — Other Ambulatory Visit: Payer: Self-pay

## 2020-10-23 ENCOUNTER — Inpatient Hospital Stay (HOSPITAL_BASED_OUTPATIENT_CLINIC_OR_DEPARTMENT_OTHER): Payer: Medicare Other | Admitting: Medical

## 2020-10-23 VITALS — BP 114/73 | HR 72 | Temp 95.4°F | Resp 18 | Ht 66.0 in | Wt 163.6 lb

## 2020-10-23 DIAGNOSIS — Z5111 Encounter for antineoplastic chemotherapy: Secondary | ICD-10-CM | POA: Diagnosis not present

## 2020-10-23 DIAGNOSIS — C252 Malignant neoplasm of tail of pancreas: Secondary | ICD-10-CM | POA: Diagnosis not present

## 2020-10-23 DIAGNOSIS — I712 Thoracic aortic aneurysm, without rupture: Secondary | ICD-10-CM | POA: Insufficient documentation

## 2020-10-23 DIAGNOSIS — Z452 Encounter for adjustment and management of vascular access device: Secondary | ICD-10-CM | POA: Insufficient documentation

## 2020-10-23 DIAGNOSIS — I10 Essential (primary) hypertension: Secondary | ICD-10-CM | POA: Diagnosis not present

## 2020-10-23 DIAGNOSIS — L0291 Cutaneous abscess, unspecified: Secondary | ICD-10-CM

## 2020-10-23 DIAGNOSIS — C787 Secondary malignant neoplasm of liver and intrahepatic bile duct: Secondary | ICD-10-CM | POA: Insufficient documentation

## 2020-10-23 DIAGNOSIS — L723 Sebaceous cyst: Secondary | ICD-10-CM | POA: Diagnosis not present

## 2020-10-23 DIAGNOSIS — K219 Gastro-esophageal reflux disease without esophagitis: Secondary | ICD-10-CM | POA: Diagnosis not present

## 2020-10-23 DIAGNOSIS — E785 Hyperlipidemia, unspecified: Secondary | ICD-10-CM | POA: Diagnosis not present

## 2020-10-23 DIAGNOSIS — Z95828 Presence of other vascular implants and grafts: Secondary | ICD-10-CM

## 2020-10-23 LAB — CBC WITH DIFFERENTIAL (CANCER CENTER ONLY)
Abs Immature Granulocytes: 0.01 10*3/uL (ref 0.00–0.07)
Basophils Absolute: 0 10*3/uL (ref 0.0–0.1)
Basophils Relative: 1 %
Eosinophils Absolute: 0.1 10*3/uL (ref 0.0–0.5)
Eosinophils Relative: 3 %
HCT: 33.1 % — ABNORMAL LOW (ref 39.0–52.0)
Hemoglobin: 10.8 g/dL — ABNORMAL LOW (ref 13.0–17.0)
Immature Granulocytes: 0 %
Lymphocytes Relative: 32 %
Lymphs Abs: 1.4 10*3/uL (ref 0.7–4.0)
MCH: 27.8 pg (ref 26.0–34.0)
MCHC: 32.6 g/dL (ref 30.0–36.0)
MCV: 85.1 fL (ref 80.0–100.0)
Monocytes Absolute: 0.7 10*3/uL (ref 0.1–1.0)
Monocytes Relative: 16 %
Neutro Abs: 2.1 10*3/uL (ref 1.7–7.7)
Neutrophils Relative %: 48 %
Platelet Count: 156 10*3/uL (ref 150–400)
RBC: 3.89 MIL/uL — ABNORMAL LOW (ref 4.22–5.81)
RDW: 18.3 % — ABNORMAL HIGH (ref 11.5–15.5)
WBC Count: 4.4 10*3/uL (ref 4.0–10.5)
nRBC: 0 % (ref 0.0–0.2)

## 2020-10-23 MED ORDER — MUPIROCIN 2 % EX OINT: 1.0000 "application " | TOPICAL_OINTMENT | Freq: Three times a day (TID) | CUTANEOUS | 1 refills | Status: DC

## 2020-10-23 MED ORDER — HEPARIN SOD (PORK) LOCK FLUSH 100 UNIT/ML IV SOLN
500.0000 [IU] | Freq: Once | INTRAVENOUS | Status: AC | PRN
  Administered 2020-10-23: 500 [IU]
  Filled 2020-10-23: qty 5

## 2020-10-23 MED ORDER — CEPHALEXIN 500 MG PO CAPS: 500.0000 mg | ORAL_CAPSULE | Freq: Three times a day (TID) | ORAL | 0 refills | Status: DC

## 2020-10-23 MED ORDER — TRAMADOL HCL 50 MG PO TABS: 50.0000 mg | ORAL_TABLET | Freq: Two times a day (BID) | ORAL | 0 refills | Status: DC | PRN

## 2020-10-23 MED ORDER — SODIUM CHLORIDE 0.9% FLUSH
10.0000 mL | INTRAVENOUS | Status: DC | PRN
  Administered 2020-10-23: 10 mL
  Filled 2020-10-23: qty 10

## 2020-10-25 NOTE — Progress Notes (Signed)
[facility]pt[facility] Management Clinic Progress Note   Gary [name] [id number] 06-01-1932 86 y.o.  Gary [name] is managed by Dr. Dominica Severin B. Sherrill  Actively treated with chemotherapy/immunotherapy/hormonal therapy: yes  Current therapy: Gemcitabine and Abraxane  Last treated: 10/12/2020 (cycle 4, day 15)  Next scheduled appointment with provider: 11/01/2020  Assessment: Plan:    Cancer of pancreas, tail ([place]) - Plan: CBC with Differential (Cancer Center Only)  Abscess - Plan: cephALEXin (KEFLEX) 500 MG capsule, mupirocin ointment (BACTROBAN) 2 %, traMADol (ULTRAM) 50 MG tablet, Aerobic Culture (superficial specimen)   Metastatic pancreatic cancer with liver metastasis: Gary [name] continues to be managed by Dr. Dominica Severin B. Sherrill and is status post cycle 4, day 15 of gemcitabine and Abraxane which was dosed on 10/12/2020.  He is scheduled to return for follow-up on 11/01/2020.  Suspected infected inclusion cyst of the right upper back: A culture was obtained from material that was expressed from the lesion.  The patient was placed on Keflex 500 mg p.o. 3 times daily and was given a prescription for Bactroban.  Additionally he was given a prescription for tramadol for pain control.  Please see After Visit Summary for patient specific instructions.  Future Appointments  Date Time Provider [place]  10/26/2020  1:00 PM [facility] 3 [facility]  11/01/2020 11:00 AM [facility] LAB [facility] None  11/01/2020 11:15 AM [facility] [place] FLUSH [facility] None  11/01/2020 11:30 AM Ladell Pier, MD [facility] None  11/01/2020 12:15 PM [facility] INFUSION [facility] None  11/02/2020  3:20 PM Croitoru, Dani Gobble, MD [facility]    Orders Placed This Encounter  Procedures  . Aerobic Culture (superficial specimen)  . CBC with Differential (Cancer Center Only)       Subjective:   Patient ID:  Gary [name] is a 86 y.o. (DOB Jan 28, 1932) male.  Chief Complaint: No chief  complaint on file.   HPI Gary [name]  is a 86 y.o. male with a diagnosis of a metastatic pancreatic cancer with liver metastasis.  He is followed by Dr. Dominica Severin B. Sherrill and is status post cycle 4, day 15 of gemcitabine and Abraxane which was dosed on 10/12/2020.  He presents to the clinic today with his son.  He has an enlarged tender mass in his right upper back.  The area has caused him some difficulty in sleeping due to pressure on the site when he is lying on his back.  He denies fevers, chills, or sweats.   Medications: I have reviewed the patient's current medications.  Allergies:  Allergies  Allergen Reactions  . Gentamycin [Gentamicin Sulfate]     "Inner ears problems after Gent infusion" , also "unsteady mobility"  . Hydrocodone   . Aspirin Other (See Comments)    High doses cause stomach upset    Past Medical History:  Diagnosis Date  . Ascending aortic aneurysm ([place]) 01/13/2014   stable at 4.7 cm diameter since 2009  . Asystole x2, s/p pacemaker 01/13/2014  . Bradycardia    permanent pacemaker 11/22/08 St.Jude  . Cancer ([place])   . Family history of adverse reaction to anesthesia    brother died after anesthesia / nose polyp removal at [facility] (40 years ago)  . HTN (hypertension) 01/13/2014  . Hyperlipidemia   . Hypertension   . Pacemaker - leads 1987, last generator 2009 St. Jude dual-chamber 01/13/2014   Atrial lead model 433-01, ventricular lead model 431-01 implanted November 1987 Right ventricular lead dysfunction with low impedance and mediocre sensing but  satisfactory pacing threshold  . Pacemaker lead malfunction 01/13/2014   Chronic low impedance and poor sensing of the ventricular lead    Past Surgical History:  Procedure Laterality Date  . IR IMAGING GUIDED PORT INSERTION  05/24/2020  . PERMANENT PACEMAKER GENERATOR CHANGE  11/22/08   ST. Jude  . PERMANENT PACEMAKER INSERTION  03/04/97   ST. Jude  . US ECHOCARDIOGRAPHY  05/16/11   AS mild to mod.,TR mild to mod.,  EF => 55%.    Family History  Problem Relation Age of Onset  . Cancer Mother   . Heart attack Father     Social History   Socioeconomic History  . Marital status: Married    Spouse name: Not on file  . Number of children: Not on file  . Years of education: Not on file  . Highest education level: Not on file  Occupational History  . Not on file  Tobacco Use  . Smoking status: Never Smoker  . Smokeless tobacco: Never Used  Vaping Use  . Vaping Use: Never used  Substance and Sexual Activity  . Alcohol use: Yes    Comment: occas.  . Drug use: No  . Sexual activity: Not on file  Other Topics Concern  . Not on file  Social History Narrative  . Not on file   Social Determinants of Health   Financial Resource Strain:   . Difficulty of Paying Living Expenses: Not on file  Food Insecurity:   . Worried About Charity fundraiser in the Last Year: Not on file  . Ran Out of Food in the Last Year: Not on file  Transportation Needs:   . Lack of Transportation (Medical): Not on file  . Lack of Transportation (Non-Medical): Not on file  Physical Activity:   . Days of Exercise per Week: Not on file  . Minutes of Exercise per Session: Not on file  Stress:   . Feeling of Stress : Not on file  Social Connections:   . Frequency of Communication with Friends and Family: Not on file  . Frequency of Social Gatherings with Friends and Family: Not on file  . Attends Religious Services: Not on file  . Active Member of Clubs or Organizations: Not on file  . Attends Archivist Meetings: Not on file  . Marital Status: Not on file  Intimate Partner Violence:   . Fear of Current or Ex-Partner: Not on file  . Emotionally Abused: Not on file  . Physically Abused: Not on file  . Sexually Abused: Not on file    Past Medical History, Surgical history, Social history, and Family history were reviewed and updated as appropriate.   Please see review of systems for further details on  the patient's review from today.   Review of Systems:  Review of Systems  Constitutional: Negative for chills, diaphoresis and fever.  HENT: Negative for facial swelling and trouble swallowing.   Respiratory: Negative for cough, chest tightness and shortness of breath.   Cardiovascular: Negative for chest pain.  Skin:       The patient has an enlarged tender mass in his right upper back.    Objective:   Physical Exam:  BP 114/73   Pulse 72   Temp (!) 95.4 F (35.2 C) (Tympanic)   Resp 18   Ht 5\' 6"  (1.676 m)   Wt 163 lb 9.6 oz (74.2 kg)   SpO2 98%   BMI 26.41 kg/m  ECOG: 1  Physical Exam  Constitutional:      General: He is not in acute distress.    Appearance: He is not ill-appearing or diaphoretic.  HENT:     Head: Normocephalic and atraumatic.  Skin:    General: Skin is warm and dry.     Findings: Lesion present.     Comments: There is an enlarged tender mass in the patient's upper right back that is draining purulent material when pressure is applied.  No increased warmth erythema is noted.  Neurological:     Mental Status: He is alert.     Coordination: Coordination normal.     Gait: Gait abnormal (The patient is ambulating with the use of a wheelchair.).  Psychiatric:        Mood and Affect: Mood normal.        Behavior: Behavior normal.        Thought Content: Thought content normal.        Judgment: Judgment normal.     Lab Review:     Component Value Date/Time   NA 138 10/11/2020 1338   NA 142 03/23/2020 1428   K 4.1 10/11/2020 1338   CL 106 10/11/2020 1338   CO2 26 10/11/2020 1338   GLUCOSE 96 10/11/2020 1338   BUN 20 10/11/2020 1338   BUN 18 03/23/2020 1428   CREATININE 0.80 10/11/2020 1338   CREATININE 0.99 07/10/2016 1043   CALCIUM 8.7 (L) 10/11/2020 1338   PROT 6.6 10/11/2020 1338   ALBUMIN 2.5 (L) 10/11/2020 1338   AST 35 10/11/2020 1338   ALT 27 10/11/2020 1338   ALKPHOS 146 (H) 10/11/2020 1338   BILITOT 0.9 10/11/2020 1338    GFRNONAA >60 10/11/2020 1338   GFRAA >60 09/05/2020 1222       Component Value Date/Time   WBC 4.4 10/23/2020 1255   WBC 15.2 (H) 05/28/2020 1759   RBC 3.89 (L) 10/23/2020 1255   HGB 10.8 (L) 10/23/2020 1255   HCT 33.1 (L) 10/23/2020 1255   PLT 156 10/23/2020 1255   MCV 85.1 10/23/2020 1255   MCH 27.8 10/23/2020 1255   MCHC 32.6 10/23/2020 1255   RDW 18.3 (H) 10/23/2020 1255   LYMPHSABS 1.4 10/23/2020 1255   MONOABS 0.7 10/23/2020 1255   EOSABS 0.1 10/23/2020 1255   BASOSABS 0.0 10/23/2020 1255   -------------------------------  Imaging from last 24 hours (if applicable):  Radiology interpretation: US Paracentesis  Result Date: 10/09/2020 INDICATION: History of pancreatic cancer. Abdominal distention and recurrent ascites. Request for diagnostic and therapeutic paracentesis. EXAM: ULTRASOUND GUIDED RIGHT LOWER QUADRANT PARACENTESIS MEDICATIONS: None. COMPLICATIONS: None immediate. PROCEDURE: Informed written consent was obtained from the patient after a discussion of the risks, benefits and alternatives to treatment. A timeout was performed prior to the initiation of the procedure. Initial ultrasound scanning demonstrates a large amount of ascites within the right lower abdominal quadrant. The right lower abdomen was prepped and draped in the usual sterile fashion. 1% lidocaine was used for local anesthesia. Following this, a 19 gauge, 7-cm, Yueh catheter was introduced. An ultrasound image was saved for documentation purposes. The paracentesis was performed. The catheter was removed and a dressing was applied. The patient tolerated the procedure well without immediate post procedural complication. FINDINGS: A total of approximately 4 L of clear yellow fluid was removed. Samples were sent to the laboratory as requested by the clinical team. IMPRESSION: Successful ultrasound-guided paracentesis yielding 4 liters of peritoneal fluid. Read by: Ascencion Dike PA-C Electronically Signed   By:  Ruthann Cancer MD  On: 10/09/2020 16:45   US Paracentesis  Result Date: 09/26/2020 INDICATION: Patient with history of pancreatic cancer, ascites. Request made for diagnostic and therapeutic paracentesis. EXAM: ULTRASOUND GUIDED DIAGNOSTIC AND THERAPEUTIC PARACENTESIS MEDICATIONS: 1% lidocaine to skin and subcutaneous tissue COMPLICATIONS: None immediate. PROCEDURE: Informed written consent was obtained from the patient after a discussion of the risks, benefits and alternatives to treatment. A timeout was performed prior to the initiation of the procedure. Initial ultrasound scanning demonstrates a large amount of ascites within the right lower abdominal quadrant. The right lower abdomen was prepped and draped in the usual sterile fashion. 1% lidocaine was used for local anesthesia. Following this, a 19 gauge, 10-cm, Yueh catheter was introduced. An ultrasound image was saved for documentation purposes. The paracentesis was performed. The catheter was removed and a dressing was applied. The patient tolerated the procedure well without immediate post procedural complication. FINDINGS: A total of approximately 4.5 liters of yellow fluid was removed. Samples were sent to the laboratory as requested by the clinical team. IMPRESSION: Successful ultrasound-guided diagnostic and therapeutic paracentesis yielding 4.5 liters of peritoneal fluid. Read by: Rowe Robert, PA-C Electronically Signed   By: Lucrezia Europe M.D.   On: 09/26/2020 15:26        This case was discussed with Dr. Benay Spice. He expressed agreement with my management of this patient.

## 2020-10-26 ENCOUNTER — Ambulatory Visit (HOSPITAL_COMMUNITY)
Admission: RE | Admit: 2020-10-26 | Discharge: 2020-10-26 | Disposition: A | Discharge status: Home / Self Care | Payer: Medicare Other | Source: Ambulatory Visit | Attending: Nurse Practitioner | Admitting: Nurse Practitioner

## 2020-10-26 ENCOUNTER — Other Ambulatory Visit: Payer: Self-pay

## 2020-10-26 DIAGNOSIS — R188 Other ascites: Secondary | ICD-10-CM | POA: Diagnosis not present

## 2020-10-26 DIAGNOSIS — R180 Malignant ascites: Secondary | ICD-10-CM | POA: Insufficient documentation

## 2020-10-26 DIAGNOSIS — C252 Malignant neoplasm of tail of pancreas: Secondary | ICD-10-CM | POA: Diagnosis not present

## 2020-10-26 HISTORY — PX: IR PARACENTESIS: IMG2679

## 2020-10-26 MED ORDER — LIDOCAINE HCL 1 % IJ SOLN
INTRAMUSCULAR | Status: DC | PRN
  Administered 2020-10-26: 10 mL

## 2020-10-26 MED ORDER — LIDOCAINE HCL 1 % IJ SOLN
INTRAMUSCULAR | Status: AC
  Filled 2020-10-26: qty 20

## 2020-10-26 NOTE — Procedures (Signed)
PROCEDURE SUMMARY:  Successful image-guided paracentesis from the right lateral abdomen.  Yielded 3.5 liters of clear gold fluid.  No immediate complications.  EBL = 0 mL. Patient tolerated well.   Specimen was not sent for labs.  Please see imaging section of Epic for full dictation.   Avi Kerschner PA-C 10/26/2020 1:22 PM

## 2020-10-27 ENCOUNTER — Telehealth: Payer: Self-pay | Admitting: *Deleted

## 2020-10-27 ENCOUNTER — Other Ambulatory Visit: Payer: Self-pay | Admitting: Nurse Practitioner

## 2020-10-27 DIAGNOSIS — C252 Malignant neoplasm of tail of pancreas: Secondary | ICD-10-CM

## 2020-10-27 LAB — AEROBIC CULTURE W GRAM STAIN (SUPERFICIAL SPECIMEN)

## 2020-10-27 MED ORDER — CIPROFLOXACIN HCL 500 MG PO TABS: 500.0000 mg | ORAL_TABLET | Freq: Two times a day (BID) | ORAL | 0 refills | Status: AC

## 2020-10-27 NOTE — Telephone Encounter (Signed)
Informed daughter that his wound culture showed staphylococcus, which requires a change in antibiotic. He should stop the keflex and pick up cipro antibiotic from pharmacy and begin this one now.

## 2020-10-27 NOTE — Telephone Encounter (Signed)
-----   Message from Owens Shark, NP sent at 10/27/2020  3:06 PM EST ----- Please let his son or daughter know the wound culture returned positive for 2 Staphylococcus organisms.  We need to change his current antibiotic.  He should discontinue Keflex and begin ciprofloxacin.  I am sending a prescription to his pharmacy.

## 2020-10-31 ENCOUNTER — Telehealth: Payer: Self-pay | Admitting: *Deleted

## 2020-10-31 NOTE — Telephone Encounter (Signed)
Son had left message evening of 10/27/20 w/concerns about potential adverse effects from antibiotic. Called today and left VM to call office if they still have concerns.

## 2020-11-01 ENCOUNTER — Other Ambulatory Visit: Payer: Medicare Other

## 2020-11-01 ENCOUNTER — Inpatient Hospital Stay: Payer: Medicare Other

## 2020-11-01 ENCOUNTER — Inpatient Hospital Stay (HOSPITAL_BASED_OUTPATIENT_CLINIC_OR_DEPARTMENT_OTHER): Payer: Medicare Other | Admitting: Oncology

## 2020-11-01 ENCOUNTER — Other Ambulatory Visit: Payer: Self-pay

## 2020-11-01 ENCOUNTER — Ambulatory Visit: Payer: Medicare Other | Admitting: Nurse Practitioner

## 2020-11-01 ENCOUNTER — Ambulatory Visit: Payer: Medicare Other

## 2020-11-01 VITALS — BP 127/89 | HR 77 | Temp 97.3°F | Resp 16 | Ht 66.0 in | Wt 162.0 lb

## 2020-11-01 DIAGNOSIS — C252 Malignant neoplasm of tail of pancreas: Secondary | ICD-10-CM

## 2020-11-01 DIAGNOSIS — E785 Hyperlipidemia, unspecified: Secondary | ICD-10-CM | POA: Diagnosis not present

## 2020-11-01 DIAGNOSIS — C787 Secondary malignant neoplasm of liver and intrahepatic bile duct: Secondary | ICD-10-CM | POA: Diagnosis not present

## 2020-11-01 DIAGNOSIS — I10 Essential (primary) hypertension: Secondary | ICD-10-CM | POA: Diagnosis not present

## 2020-11-01 DIAGNOSIS — K219 Gastro-esophageal reflux disease without esophagitis: Secondary | ICD-10-CM | POA: Diagnosis not present

## 2020-11-01 DIAGNOSIS — Z5111 Encounter for antineoplastic chemotherapy: Secondary | ICD-10-CM | POA: Diagnosis not present

## 2020-11-01 DIAGNOSIS — Z95828 Presence of other vascular implants and grafts: Secondary | ICD-10-CM

## 2020-11-01 LAB — CMP (CANCER CENTER ONLY)
ALT: 21 U/L (ref 0–44)
AST: 29 U/L (ref 15–41)
Albumin: 2.5 g/dL — ABNORMAL LOW (ref 3.5–5.0)
Alkaline Phosphatase: 132 U/L — ABNORMAL HIGH (ref 38–126)
Anion gap: 8 (ref 5–15)
BUN: 29 mg/dL — ABNORMAL HIGH (ref 8–23)
CO2: 23 mmol/L (ref 22–32)
Calcium: 8.4 mg/dL — ABNORMAL LOW (ref 8.9–10.3)
Chloride: 107 mmol/L (ref 98–111)
Creatinine: 0.85 mg/dL (ref 0.61–1.24)
GFR, Estimated: >60 mL/min (ref >60)
Glucose, Bld: 94 mg/dL (ref 70–99)
Potassium: 3.7 mmol/L (ref 3.5–5.1)
Sodium: 138 mmol/L (ref 135–145)
Total Bilirubin: 0.6 mg/dL (ref 0.3–1.2)
Total Protein: 6.6 g/dL (ref 6.5–8.1)

## 2020-11-01 LAB — CBC WITH DIFFERENTIAL (CANCER CENTER ONLY)
Abs Immature Granulocytes: 0.04 10*3/uL (ref 0.00–0.07)
Basophils Absolute: 0.1 10*3/uL (ref 0.0–0.1)
Basophils Relative: 1 %
Eosinophils Absolute: 0 10*3/uL (ref 0.0–0.5)
Eosinophils Relative: 0 %
HCT: 35.9 % — ABNORMAL LOW (ref 39.0–52.0)
Hemoglobin: 11.6 g/dL — ABNORMAL LOW (ref 13.0–17.0)
Immature Granulocytes: 1 %
Lymphocytes Relative: 18 %
Lymphs Abs: 1.4 10*3/uL (ref 0.7–4.0)
MCH: 27.9 pg (ref 26.0–34.0)
MCHC: 32.3 g/dL (ref 30.0–36.0)
MCV: 86.3 fL (ref 80.0–100.0)
Monocytes Absolute: 1 10*3/uL (ref 0.1–1.0)
Monocytes Relative: 13 %
Neutro Abs: 5.3 10*3/uL (ref 1.7–7.7)
Neutrophils Relative %: 67 %
Platelet Count: 300 10*3/uL (ref 150–400)
RBC: 4.16 MIL/uL — ABNORMAL LOW (ref 4.22–5.81)
RDW: 18.7 % — ABNORMAL HIGH (ref 11.5–15.5)
WBC Count: 7.8 10*3/uL (ref 4.0–10.5)
nRBC: 0 % (ref 0.0–0.2)

## 2020-11-01 MED ORDER — SODIUM CHLORIDE 0.9 % IV SOLN
800.0000 mg/m2 | Freq: Once | INTRAVENOUS | Status: AC
  Administered 2020-11-01: 1520 mg via INTRAVENOUS
  Filled 2020-11-01: qty 39.98

## 2020-11-01 MED ORDER — SODIUM CHLORIDE 0.9% FLUSH
10.0000 mL | INTRAVENOUS | Status: DC | PRN
  Filled 2020-11-01: qty 10

## 2020-11-01 MED ORDER — SODIUM CHLORIDE 0.9 % IV SOLN
10.0000 mg | Freq: Once | INTRAVENOUS | Status: AC
  Administered 2020-11-01: 10 mg via INTRAVENOUS
  Filled 2020-11-01: qty 1
  Filled 2020-11-01: qty 10

## 2020-11-01 MED ORDER — ONDANSETRON HCL 4 MG/2ML IJ SOLN
8.0000 mg | Freq: Once | INTRAMUSCULAR | Status: AC
  Administered 2020-11-01: 8 mg via INTRAVENOUS

## 2020-11-01 MED ORDER — ONDANSETRON HCL 4 MG/2ML IJ SOLN
INTRAMUSCULAR | Status: AC
  Filled 2020-11-01: qty 4

## 2020-11-01 MED ORDER — HEPARIN SOD (PORK) LOCK FLUSH 100 UNIT/ML IV SOLN
500.0000 [IU] | Freq: Once | INTRAVENOUS | Status: AC | PRN
  Administered 2020-11-01: 500 [IU]
  Filled 2020-11-01: qty 5

## 2020-11-01 MED ORDER — SODIUM CHLORIDE 0.9 % IV SOLN
Freq: Once | INTRAVENOUS | Status: AC
  Filled 2020-11-01: qty 250

## 2020-11-01 MED ORDER — SODIUM CHLORIDE 0.9% FLUSH
10.0000 mL | INTRAVENOUS | Status: DC | PRN
  Administered 2020-11-01 (×2): 10 mL via INTRAVENOUS
  Filled 2020-11-01: qty 10

## 2020-11-01 MED ORDER — PACLITAXEL PROTEIN-BOUND CHEMO INJECTION 100 MG
75.0000 mg/m2 | Freq: Once | INTRAVENOUS | Status: AC
  Administered 2020-11-01: 150 mg via INTRAVENOUS
  Filled 2020-11-01: qty 30

## 2020-11-01 NOTE — Patient Instructions (Signed)
[facility] Discharge Instructions for Patients Receiving Chemotherapy  Today you received the following chemotherapy agents gemzar, abraxane  To help prevent nausea and vomiting after your treatment, we encourage you to take your nausea medication as directed.   If you develop nausea and vomiting that is not controlled by your nausea medication, call the clinic.   BELOW ARE SYMPTOMS THAT SHOULD BE REPORTED IMMEDIATELY:  *FEVER GREATER THAN 100.5 F  *CHILLS WITH OR WITHOUT FEVER  NAUSEA AND VOMITING THAT IS NOT CONTROLLED WITH YOUR NAUSEA MEDICATION  *UNUSUAL SHORTNESS OF BREATH  *UNUSUAL BRUISING OR BLEEDING  TENDERNESS IN MOUTH AND THROAT WITH OR WITHOUT PRESENCE OF ULCERS  *URINARY PROBLEMS  *BOWEL PROBLEMS  UNUSUAL RASH Items with * indicate a potential emergency and should be followed up as soon as possible.  Feel free to call the clinic should you have any questions or concerns. The clinic phone number is [phone].  Please show the CHEMO ALERT CARD at check-in to the Emergency Department and triage nurse.   

## 2020-11-01 NOTE — Progress Notes (Signed)
These preliminary result these preliminary results were noted.  Awaiting final report.

## 2020-11-01 NOTE — Progress Notes (Signed)
[facility] OFFICE PROGRESS NOTE   Diagnosis: Pancreas cancer  INTERVAL HISTORY:   Mr. Villa Herb completed another cycle of gemcitabine/Abraxane on 10/12/2020.  No neuropathy symptoms.  No pain.  He continues to have generalized weakness.  He last underwent a paracentesis for 3.5 L of fluid on 10/26/2020.  He was seen in the symptom management clinic with an inflamed cystic area at the right upper back on 10/23/2020.  He was placed on Keflex and Bactroban.  A culture returned positive for a staph species.  The antibiotic was switched to ciprofloxacin on 10/27/2020.  He reports persistent firm fullness at the cystic area with associated erythema.   Objective:  Vital signs in last 24 hours:  Blood pressure 127/89, pulse 77, temperature (!) 97.3 F (36.3 C), temperature source Tympanic, resp. rate 16, height 5\' 6"  (1.676 m), weight 162 lb (73.5 kg), SpO2 99 %.    Resp: Scattered expiratory rhonchi, no respiratory distress Cardio: Regular rate and rhythm GI: Distended with ascites, nontender, no mass, no hepatomegaly Vascular: Trace pitting edema at the lower leg bilaterally  Skin: 2 cm firm round lesion at the right upper back with overlying erythema.  There is a central head.  I was able to express a small amount of white material.  Portacath/PICC-without erythema  Lab Results:  Lab Results  Component Value Date   WBC 7.8 11/01/2020   HGB 11.6 (L) 11/01/2020   HCT 35.9 (L) 11/01/2020   MCV 86.3 11/01/2020   PLT 300 11/01/2020   NEUTROABS 5.3 11/01/2020    CMP  Lab Results  Component Value Date   NA 138 10/11/2020   K 4.1 10/11/2020   CL 106 10/11/2020   CO2 26 10/11/2020   GLUCOSE 96 10/11/2020   BUN 20 10/11/2020   CREATININE 0.80 10/11/2020   CALCIUM 8.7 (L) 10/11/2020   PROT 6.6 10/11/2020   ALBUMIN 2.5 (L) 10/11/2020   AST 35 10/11/2020   ALT 27 10/11/2020   ALKPHOS 146 (H) 10/11/2020   BILITOT 0.9 10/11/2020   GFRNONAA >60 10/11/2020   GFRAA  >60 09/05/2020    Medications: I have reviewed the patient's current medications.   Assessment/Plan:  1. Pancreas tail lesion, multiple liver lesions   Chest CT 04/12/2020-ascending thoracic aortic aneurysm; hypovascular lesion tail of the pancreas, new compared to the prior examination. Multiple poorly defined hypovascular lesions scattered throughout the liver   CT abdomen/pelvis 05/02/2020-hypoenhancing lesion of the pancreatic tail measuring approximately 2.4 x 1.9 cm; enlarged portacaval lymph nodes measuring up to 1.9 x 1.4 cm; numerous hypodense lesions throughout the liver, index lesion of the liver dome measuring 1.7 x 1.2 cm.  Ultrasound-guided biopsy of a left liver lesion 05/10/2020-poorly differentiated carcinoma consistent with a pancreatobiliary primary  Markedly elevated CA 19-9  Cycle 1day 1gemcitabine/Abraxane 05/26/2020  Cycle1 day 15gemcitabine/Abraxane 06/12/2020  Cycle2 day 1gemcitabine/Abraxane 07/04/2020(chemotherapy doses reduced, antiemetic regimen adjusted)  Cycle2 day 15Gemcitabine/Abraxane 07/18/2020(continue same dose reduction, reduce post-treatment steroids due to insomnia)  Cycle 3 day 1 gem/abraxane 08/02/20  Cycle 3 day 15 gem/Abraxane 08/15/2020  CTs abdomen/pelvis 08/25/2020-pancreas tail mass and portacaval lymphadenopathy decreased.  Liver metastases reported as increased, review requested.  Cycle 4-day 1 gemcitabine/Abraxane 09/05/2020  Paracentesis 09/26/2020-reactive mesothelial cells  Paracentesis 10/09/2020-reactive mesothelial cells  Cycle 5 gemcitabine/Abraxane 10/12/2020  Cycle 6 gemcitabine/Abraxane 11/01/2020  2. Thoracic aortic aneurysm 3. Hypertension 4. Hyperlipidemia 5. History of bradycardia status post permanent pacemaker 6. Mother hadcervicalcancer 7. Pitting lower leg edema 8. GERD, on omeprazole 9. Right upper  back inflamed sebaceous cyst-culture from cyst drainage 10/23/2020-2 staph species, antibiotic  changed to ciprofloxacin 10/27/2020   Disposition: Mr. Sanguinetti appears stable.  He will complete another treatment with gemcitabine/Abraxane today.  The CA 19-9 was lower when he was here on 10/11/2020.  He will return for an office visit in the next cycle of chemotherapy in 3 weeks.  There is no clinical evidence of disease progression.  He has an inflamed sebaceous cyst at the right upper back.  I recommended warm compresses.  He will complete the course of ciprofloxacin.  He will call for a fever or progressive erythema surrounding the cyst.  We will make a surgical referral for an I&D if the cyst does not improve.  Betsy Coder, MD  11/01/2020  12:07 PM

## 2020-11-02 ENCOUNTER — Ambulatory Visit (INDEPENDENT_AMBULATORY_CARE_PROVIDER_SITE_OTHER): Payer: Medicare Other | Admitting: Cardiovascular Disease

## 2020-11-02 ENCOUNTER — Encounter: Payer: Self-pay | Admitting: Cardiovascular Disease

## 2020-11-02 VITALS — BP 110/70 | HR 59 | Ht 66.0 in | Wt 165.0 lb

## 2020-11-02 DIAGNOSIS — E7800 Pure hypercholesterolemia, unspecified: Secondary | ICD-10-CM | POA: Diagnosis not present

## 2020-11-02 DIAGNOSIS — I700 Atherosclerosis of aorta: Secondary | ICD-10-CM

## 2020-11-02 DIAGNOSIS — C252 Malignant neoplasm of tail of pancreas: Secondary | ICD-10-CM

## 2020-11-02 DIAGNOSIS — R600 Localized edema: Secondary | ICD-10-CM

## 2020-11-02 DIAGNOSIS — I10 Essential (primary) hypertension: Secondary | ICD-10-CM

## 2020-11-02 DIAGNOSIS — I712 Thoracic aortic aneurysm, without rupture: Secondary | ICD-10-CM

## 2020-11-02 DIAGNOSIS — Z950 Presence of cardiac pacemaker: Secondary | ICD-10-CM | POA: Diagnosis not present

## 2020-11-02 DIAGNOSIS — I7121 Aneurysm of the ascending aorta, without rupture: Secondary | ICD-10-CM

## 2020-11-02 LAB — CANCER ANTIGEN 19-9: CA 19-9: 4457 U/mL — ABNORMAL HIGH (ref 0–35)

## 2020-11-02 NOTE — Patient Instructions (Signed)
Medication Instructions:  ?No changes ?*If you need a refill on your cardiac medications before your next appointment, please call your pharmacy* ? ? ?Lab Work: ?None ordered ?If you have labs (blood work) drawn today and your tests are completely normal, you will receive your results only by: ?MyChart Message (if you have MyChart) OR ?A paper copy in the mail ?If you have any lab test that is abnormal or we need to change your treatment, we will call you to review the results. ? ? ?Testing/Procedures: ?Your physician has requested that you have an echocardiogram. Echocardiography is a painless test that uses sound waves to create images of your heart. It provides your doctor with information about the size and shape of your heart and how well your heart?s chambers and valves are working. You may receive an ultrasound enhancing agent through an IV if needed to better visualize your heart during the echo.This procedure takes approximately one hour. There are no restrictions for this procedure. This will take place at the [address], [address].  ? ? ? ?Follow-Up: ?At [facility], you and your health needs are our priority.  As part of our continuing mission to provide you with exceptional heart care, we have created designated Provider Care Teams.  These Care Teams include your primary Cardiologist (physician) and Advanced Practice Providers (APPs -  Physician Assistants and Nurse Practitioners) who all work together to provide you with the care you need, when you need it. ? ?We recommend signing up for the patient portal called "MyChart".  Sign up information is provided on this After Visit Summary.  MyChart is used to connect with patients for Virtual Visits (Telemedicine).  Patients are able to view lab/test results, encounter notes, upcoming appointments, etc.  Non-urgent messages can be sent to your provider as well.   ?To learn more about what you can do with MyChart, go to [email or web address].    ? ?Your next appointment:   ?12 month(s) ? ?The format for your next appointment:   ?In Person ? ?Provider:   ?Mihai Croitoru, MD ? ?

## 2020-11-03 ENCOUNTER — Telehealth: Payer: Self-pay | Admitting: Oncology

## 2020-11-03 NOTE — Progress Notes (Signed)
Cardiology Office Note    Date:  11/03/2020   ID:  Gary [name], DOB March 29, 1932, MRN [id number]  PCP:  [name], MD  Cardiologist:   Sanda Klein, MD   Chief complaint: edema, aortic aneurysm  History of Present Illness:  Gary [name] is a 86 y.o. male with a moderate-sized ascending aortic aneurysm, coronary calcifications without symptomatic CAD, hypertension and hyperlipidemia.    He has a history of dual-chamber permanent pacemaker implanted in 1987 for episodes of asystole that were likely due to medication side effects.  For many decades he did not require either atrial or ventricular pacing.  The leads from [place] had deteriorating parameters (high pacing thresholds, low impedance, sensing artifact consistent with insulation breach) which made them unusable for appropriate pacing.  The device has been programmed off for about 2 years now and he has not experienced bradycardia or syncope.  Over the last year, the dominant health problem has been widely metastatic pancreatic cancer involving both lobes of the liver, the peritoneum and the portacaval lymph nodes.  Initially discovered on a CT performed to monitor his aortic aneurysm.  He has had at least 3 paracenteses for symptomatic large ascites.  Fortunately , he does not have any problems with pain.  He is tolerating chemotherapy (gemcitabine and Abraxane) quite well.  He has received about 8 cycles so far.  His most recent CT scan of the abdomen performed September 2021 showed a slight reduction in the overall burden of liver metastases and slightly smaller portacaval lymphadenopathy as well as a reduction in size of the primary lesion in the tail of the pancreas.  He had drainage of a cyst on his back and is been treated with antibiotics and Bactroban.  His ascending aortic aneurysm has been stable in size for the last 10 years.   Past Medical History:  Diagnosis Date  . Ascending aortic aneurysm ([place]) 01/13/2014    stable at 4.7 cm diameter since 2009  . Asystole x2, s/p pacemaker 01/13/2014  . Bradycardia    permanent pacemaker 11/22/08 St.Jude  . Cancer ([place])   . Family history of adverse reaction to anesthesia    brother died after anesthesia / nose polyp removal at [facility] (40 years ago)  . HTN (hypertension) 01/13/2014  . Hyperlipidemia   . Hypertension   . Pacemaker - leads 1987, last generator 2009 St. Jude dual-chamber 01/13/2014   Atrial lead model 433-01, ventricular lead model 431-01 implanted November 1987 Right ventricular lead dysfunction with low impedance and mediocre sensing but satisfactory pacing threshold  . Pacemaker lead malfunction 01/13/2014   Chronic low impedance and poor sensing of the ventricular lead    Past Surgical History:  Procedure Laterality Date  . IR IMAGING GUIDED PORT INSERTION  05/24/2020  . IR PARACENTESIS  10/26/2020  . PERMANENT PACEMAKER GENERATOR CHANGE  11/22/08   [employer]. Jude  . PERMANENT PACEMAKER INSERTION  03/04/97   ST. Jude  . US ECHOCARDIOGRAPHY  05/16/11   AS mild to mod.,TR mild to mod., EF => 55%.    Current Medications: Outpatient Medications Prior to Visit  Medication Sig Dispense Refill  . ciprofloxacin (CIPRO) 500 MG tablet Take 1 tablet (500 mg total) by mouth 2 (two) times daily for 7 days. 14 tablet 0  . enalapril (VASOTEC) 20 MG tablet Take 1 tablet (20 mg total) by mouth daily. 90 tablet 1  . lidocaine-prilocaine (EMLA) cream Apply 1 application topically as directed. Apply 1 hour prior to IV stick  and cover with plastic wrap 30 g 2  . metoprolol succinate (TOPROL-XL) 50 MG 24 hr tablet Take 1 tablet (50 mg total) by mouth 2 (two) times daily. Take with or immediately following a meal. 180 tablet 2  . mupirocin ointment (BACTROBAN) 2 % Apply 1 application topically 3 (three) times daily. 30 g 1  . omeprazole (PRILOSEC) 20 MG capsule Take 1 capsule (20 mg total) by mouth daily. 90 capsule 3  . ondansetron (ZOFRAN) 8 MG tablet Take 1 tablet (8 mg  total) by mouth every 8 (eight) hours as needed for nausea or vomiting. (Patient not taking: Reported on 10/02/2020) 30 tablet 1  . prochlorperazine (COMPAZINE) 5 MG tablet Take 1-2 tablets (5-10 mg total) by mouth every 6 (six) hours as needed for nausea or vomiting. (Patient not taking: Reported on 10/02/2020) 30 tablet 1  . traMADol (ULTRAM) 50 MG tablet Take 1 tablet (50 mg total) by mouth every 12 (twelve) hours as needed. (Patient not taking: Reported on 11/01/2020) 30 tablet 0  . triamcinolone lotion (KENALOG) 0.1 % Apply 1 application topically 3 (three) times daily. (Patient not taking: Reported on 10/02/2020) 120 mL 2   No facility-administered medications prior to visit.     Allergies:   Gentamycin [gentamicin sulfate], Hydrocodone, and Aspirin   Social History   Socioeconomic History  . Marital status: Married    Spouse name: Not on file  . Number of children: Not on file  . Years of education: Not on file  . Highest education level: Not on file  Occupational History  . Not on file  Tobacco Use  . Smoking status: Never Smoker  . Smokeless tobacco: Never Used  Vaping Use  . Vaping Use: Never used  Substance and Sexual Activity  . Alcohol use: Yes    Comment: occas.  . Drug use: No  . Sexual activity: Not on file  Other Topics Concern  . Not on file  Social History Narrative  . Not on file   Social Determinants of Health   Financial Resource Strain:   . Difficulty of Paying Living Expenses: Not on file  Food Insecurity:   . Worried About Charity fundraiser in the Last Year: Not on file  . Ran Out of Food in the Last Year: Not on file  Transportation Needs:   . Lack of Transportation (Medical): Not on file  . Lack of Transportation (Non-Medical): Not on file  Physical Activity:   . Days of Exercise per Week: Not on file  . Minutes of Exercise per Session: Not on file  Stress:   . Feeling of Stress : Not on file  Social Connections:   . Frequency of  Communication with Friends and Family: Not on file  . Frequency of Social Gatherings with Friends and Family: Not on file  . Attends Religious Services: Not on file  . Active Member of Clubs or Organizations: Not on file  . Attends Archivist Meetings: Not on file  . Marital Status: Not on file     Family History:  The patient's family history includes Cancer in his mother; Heart attack in his father.   ROS:   Please see the history of present illness.    ROS all other systems were reviewed and are negative  PHYSICAL EXAM:   VS:  BP 110/70   Pulse (!) 59   Ht 5\' 6"  (1.676 m)   Wt 165 lb (74.8 kg)   SpO2 96%  BMI 26.63 kg/m      General: Alert, oriented x3, no distress, appears comfortable Head: no evidence of trauma, PERRL, EOMI, no exophtalmos or lid lag, no myxedema, no xanthelasma; normal ears, nose and oropharynx Neck: normal jugular venous pulsations and no hepatojugular reflux; brisk carotid pulses without delay and no carotid bruits Chest: clear to auscultation, but diminished breath sounds in left base.  Pacemaker left chest. Cardiovascular: normal position and quality of the apical impulse, regular rhythm, normal first and second heart sounds, no murmurs, rubs or gallops Abdomen: Ascites is present, no masses by palpation, no abnormal pulsatility or arterial bruits, normal bowel sounds, no hepatosplenomegaly Extremities: no clubbing, cyanosis; heart pitting 3+ edema symmetrically almost to the knees in both lower extremities; 2+ radial, ulnar and brachial pulses bilaterally; 2+ right femoral, posterior tibial and dorsalis pedis pulses; 2+ left femoral, posterior tibial and dorsalis pedis pulses; no subclavian or femoral bruits Neurological: grossly nonfocal Psych: Normal mood and affect    Wt Readings from Last 3 Encounters:  11/02/20 165 lb (74.8 kg)  11/01/20 162 lb (73.5 kg)  10/23/20 163 lb 9.6 oz (74.2 kg)      Studies/Labs Reviewed:   EKG:  EKG  is ordered today.  It shows normal sinus rhythm and is a completely normal tracing.  QTc 409 ms.  There is striking reduction in QRS voltage in leads V4-V6, suggesting the presence of a left pleural effusion. Recent Labs: BMET    Component Value Date/Time   NA 138 11/01/2020 1155   NA 142 03/23/2020 1428   K 3.7 11/01/2020 1155   CL 107 11/01/2020 1155   CO2 23 11/01/2020 1155   GLUCOSE 94 11/01/2020 1155   BUN 29 (H) 11/01/2020 1155   BUN 18 03/23/2020 1428   CREATININE 0.85 11/01/2020 1155   CREATININE 0.99 07/10/2016 1043   CALCIUM 8.4 (L) 11/01/2020 1155   GFRNONAA >60 11/01/2020 1155   GFRAA >60 09/05/2020 1222     Lipid Panel    Component Value Date/Time   CHOL 140 02/09/2015 1157   TRIG 83 02/09/2015 1157   HDL 49 02/09/2015 1157   CHOLHDL 2.9 02/09/2015 1157   VLDL 17 02/09/2015 1157   LDLCALC 74 02/09/2015 1157   09/03/2019 labs from Dr. Brigitte Pulse HDL 47, LDL 71, total cholesterol 139, TG 103 10/11/2020 Creatinine 0.8, potassium 4.1 10/23/2020 Hemoglobin 10.8  ASSESSMENT:    1. Ascending aortic aneurysm ([place])   2. Aortic atherosclerosis (H[facility])   3. Edema, lower extremity   4. Essential hypertension   5. Pure hypercholesterolemia   6. History of pacemaker   7. Cancer of pancreas, tail ([place])      PLAN:  In order of problems listed above:  1. Ascending aortic aneurysm: Most recent CT of the chest showed a stable sized ascending aortic aneurysm at 4.7-4.8 cm in diameter, which we have been monitoring on an every other year basis 2.  Aortic atherosclerosis: Incidentally discovered on imaging studies, his never had angina pectoris 3.  Edema: While his ascites may be explained by local spread of his pancreatic malignancy, I am not so sure that his lower extremity edema can be explained by this.  He does not have bulky pelvic lymphadenopathy and his edema does not look like lymphedema.  It is possible that is related to significant hypoalbuminemia (albumin 2.5).   Nevertheless, I recommended that he undergo an echocardiogram to make sure there is not a cardiac reason for this. 4. HTN (hypertension) very well controlled  5. Hyperlipidemia: All lipid parameters in target range 6. Pacemaker - leads 1987, last generator 2009 St. Jude dual-chamber: He is not had syncope or symptoms of bradycardia since we turned off his pacemaker couple of years ago.  I suspect that his episodes of bradycardia were side effects of medications that were administered to him in 1987 and that he does not really need a pacemaker at this time.  Neither his atrial nor his ventricular lead are operational, due to problems with pacing threshold increase and probable insulation defects.  If he should develop a new indication for pacing in the future, the easiest way to handle it would be to implant an entirely new system from the left subclavian approach. 7.  Metastatic pancreatic cancer: Well.  His last CT scan did suggest may be some improvement in the size of his pancreatic lesion and the secondary lesions in the liver and abdominal lymphadenopathy.  He continues to have very good functional status.  His weight has not changed, but I am concerned but some of the weight is actually edema fluid.    Medication Adjustments/Labs and Tests Ordered: Current medicines are reviewed at length with the patient today.  Concerns regarding medicines are outlined above.  Medication changes, Labs and Tests ordered today are listed in the Patient Instructions below. Patient Instructions  Medication Instructions:  No changes *If you need a refill on your cardiac medications before your next appointment, please call your pharmacy*   Lab Work: None ordered If you have labs (blood work) drawn today and your tests are completely normal, you will receive your results only by: Marland Kitchen MyChart Message (if you have MyChart) OR . A paper copy in the mail If you have any lab test that is abnormal or we need to change  your treatment, we will call you to review the results.   Testing/Procedures: Your physician has requested that you have an echocardiogram. Echocardiography is a painless test that uses sound waves to create images of your heart. It provides your doctor with information about the size and shape of your heart and how well your heart's chambers and valves are working. You may receive an ultrasound enhancing agent through an IV if needed to better visualize your heart during the echo.This procedure takes approximately one hour. There are no restrictions for this procedure. This will take place at the [address].     Follow-Up: At [facility], you and your health needs are our priority.  As part of our continuing mission to provide you with exceptional heart care, we have created designated Provider Care Teams.  These Care Teams include your primary Cardiologist (physician) and Advanced Practice Providers (APPs -  Physician Assistants and Nurse Practitioners) who all work together to provide you with the care you need, when you need it.  We recommend signing up for the patient portal called "MyChart".  Sign up information is provided on this After Visit Summary.  MyChart is used to connect with patients for Virtual Visits (Telemedicine).  Patients are able to view lab/test results, encounter notes, upcoming appointments, etc.  Non-urgent messages can be sent to your provider as well.   To learn more about what you can do with MyChart, go to [email or web address].    Your next appointment:   12 month(s)  The format for your next appointment:   In Person  Provider:   Sanda Klein, MD      Signed, Sanda Klein, MD  11/03/2020 2:57 PM  [facility] [address], [place], [place]  [address] Phone: [phone]; Fax: [phone]

## 2020-11-03 NOTE — Telephone Encounter (Signed)
Scheduled appointments per 11/17 los. Spoke to patient's wife who is aware of appointments date and times.

## 2020-11-06 ENCOUNTER — Telehealth: Payer: Self-pay

## 2020-11-06 NOTE — Telephone Encounter (Signed)
I called patient's daughter Bethena Roys back regarding boil on his back.  Per Dr. Benay Spice he is recommending perhaps surgical drainage of the boil.  He does not need to continue on the antibiotic as he already completed a course.   She spoke with her father and the boil is draining a lot, denies any fever, they are using warm compresses to it several times a day.  At this time he is declining to have surgically drained but will let Dr. Benay Spice know if he develops any fever or increased signs of infection.

## 2020-11-06 NOTE — Telephone Encounter (Signed)
Patient's daughter left message Saturday 11/20 night regarding boil he has on his back. She states he has finished the antibiotic but wonders if he needs to get a refill to continue it since he still has the boil.

## 2020-11-10 ENCOUNTER — Ambulatory Visit (HOSPITAL_COMMUNITY)
Admission: RE | Admit: 2020-11-10 | Discharge: 2020-11-10 | Disposition: A | Discharge status: Home / Self Care | Payer: Medicare Other | Source: Ambulatory Visit | Attending: Oncology | Admitting: Oncology

## 2020-11-10 ENCOUNTER — Other Ambulatory Visit: Payer: Self-pay

## 2020-11-10 DIAGNOSIS — R188 Other ascites: Secondary | ICD-10-CM | POA: Diagnosis not present

## 2020-11-10 DIAGNOSIS — C252 Malignant neoplasm of tail of pancreas: Secondary | ICD-10-CM

## 2020-11-10 MED ORDER — LIDOCAINE HCL 1 % IJ SOLN
INTRAMUSCULAR | Status: AC
  Filled 2020-11-10: qty 20

## 2020-11-10 NOTE — Procedures (Signed)
Ultrasound-guided therapeutic paracentesis performed yielding 4 liters (maximum ordered) of yellow fluid. No immediate complications.EBL none.   

## 2020-11-15 ENCOUNTER — Telehealth: Payer: Self-pay | Admitting: *Deleted

## 2020-11-15 NOTE — Telephone Encounter (Signed)
Daughter reports worsening of lesion on his back--quarter sized with fluid collection inside. Has drained small amount of pus-like liquid w/blood. No erythema around site and no fever. Asking how to manage? Would antibiotic help?

## 2020-11-15 NOTE — Telephone Encounter (Signed)
Per Dr. Benay Spice: Needs to be seen at surgical clinic for I & D. Daughter agrees and gives permission to initiate this referral. Dr. Benay Spice does not want to put him on antibiotic at this time. Appointment made for 11/17/20 at 2:20/2:50 pm at [place]. Provided daughter the appointment, address and phone # of office. Verbalized appreciation for the help.

## 2020-11-17 DIAGNOSIS — L723 Sebaceous cyst: Secondary | ICD-10-CM | POA: Diagnosis not present

## 2020-11-17 DIAGNOSIS — L089 Local infection of the skin and subcutaneous tissue, unspecified: Secondary | ICD-10-CM | POA: Diagnosis not present

## 2020-11-22 ENCOUNTER — Inpatient Hospital Stay: Payer: Medicare Other

## 2020-11-22 ENCOUNTER — Inpatient Hospital Stay: Payer: Medicare Other | Attending: Oncology

## 2020-11-22 ENCOUNTER — Other Ambulatory Visit: Payer: Self-pay

## 2020-11-22 ENCOUNTER — Inpatient Hospital Stay (HOSPITAL_BASED_OUTPATIENT_CLINIC_OR_DEPARTMENT_OTHER): Payer: Medicare Other | Admitting: Oncology

## 2020-11-22 VITALS — BP 127/83 | HR 67 | Temp 98.0°F | Resp 19 | Ht 66.0 in | Wt 163.8 lb

## 2020-11-22 DIAGNOSIS — K219 Gastro-esophageal reflux disease without esophagitis: Secondary | ICD-10-CM | POA: Insufficient documentation

## 2020-11-22 DIAGNOSIS — C252 Malignant neoplasm of tail of pancreas: Secondary | ICD-10-CM | POA: Diagnosis not present

## 2020-11-22 DIAGNOSIS — I10 Essential (primary) hypertension: Secondary | ICD-10-CM | POA: Diagnosis not present

## 2020-11-22 DIAGNOSIS — C787 Secondary malignant neoplasm of liver and intrahepatic bile duct: Secondary | ICD-10-CM | POA: Diagnosis not present

## 2020-11-22 DIAGNOSIS — E785 Hyperlipidemia, unspecified: Secondary | ICD-10-CM | POA: Insufficient documentation

## 2020-11-22 DIAGNOSIS — I712 Thoracic aortic aneurysm, without rupture: Secondary | ICD-10-CM | POA: Insufficient documentation

## 2020-11-22 DIAGNOSIS — Z5111 Encounter for antineoplastic chemotherapy: Secondary | ICD-10-CM | POA: Insufficient documentation

## 2020-11-22 DIAGNOSIS — M7989 Other specified soft tissue disorders: Secondary | ICD-10-CM | POA: Insufficient documentation

## 2020-11-22 LAB — CMP (CANCER CENTER ONLY)
ALT: 18 U/L (ref 0–44)
AST: 30 U/L (ref 15–41)
Albumin: 2.5 g/dL — ABNORMAL LOW (ref 3.5–5.0)
Alkaline Phosphatase: 125 U/L (ref 38–126)
Anion gap: 8 (ref 5–15)
BUN: 22 mg/dL (ref 8–23)
CO2: 22 mmol/L (ref 22–32)
Calcium: 8.7 mg/dL — ABNORMAL LOW (ref 8.9–10.3)
Chloride: 107 mmol/L (ref 98–111)
Creatinine: 0.9 mg/dL (ref 0.61–1.24)
GFR, Estimated: >60 mL/min (ref >60)
Glucose, Bld: 103 mg/dL — ABNORMAL HIGH (ref 70–99)
Potassium: 4.1 mmol/L (ref 3.5–5.1)
Sodium: 137 mmol/L (ref 135–145)
Total Bilirubin: 0.6 mg/dL (ref 0.3–1.2)
Total Protein: 6.7 g/dL (ref 6.5–8.1)

## 2020-11-22 LAB — CBC WITH DIFFERENTIAL (CANCER CENTER ONLY)
Abs Immature Granulocytes: 0.04 10*3/uL (ref 0.00–0.07)
Basophils Absolute: 0.1 10*3/uL (ref 0.0–0.1)
Basophils Relative: 1 %
Eosinophils Absolute: 0.1 10*3/uL (ref 0.0–0.5)
Eosinophils Relative: 1 %
HCT: 36.4 % — ABNORMAL LOW (ref 39.0–52.0)
Hemoglobin: 11.7 g/dL — ABNORMAL LOW (ref 13.0–17.0)
Immature Granulocytes: 0 %
Lymphocytes Relative: 13 %
Lymphs Abs: 1.2 10*3/uL (ref 0.7–4.0)
MCH: 27.9 pg (ref 26.0–34.0)
MCHC: 32.1 g/dL (ref 30.0–36.0)
MCV: 86.7 fL (ref 80.0–100.0)
Monocytes Absolute: 0.9 10*3/uL (ref 0.1–1.0)
Monocytes Relative: 10 %
Neutro Abs: 7 10*3/uL (ref 1.7–7.7)
Neutrophils Relative %: 75 %
Platelet Count: 317 10*3/uL (ref 150–400)
RBC: 4.2 MIL/uL — ABNORMAL LOW (ref 4.22–5.81)
RDW: 18.8 % — ABNORMAL HIGH (ref 11.5–15.5)
WBC Count: 9.3 10*3/uL (ref 4.0–10.5)
nRBC: 0 % (ref 0.0–0.2)

## 2020-11-22 MED ORDER — SODIUM CHLORIDE 0.9 % IV SOLN
Freq: Once | INTRAVENOUS | Status: AC
  Filled 2020-11-22: qty 250

## 2020-11-22 MED ORDER — SODIUM CHLORIDE 0.9% FLUSH
10.0000 mL | INTRAVENOUS | Status: DC | PRN
  Administered 2020-11-22: 10 mL
  Filled 2020-11-22: qty 10

## 2020-11-22 MED ORDER — ONDANSETRON HCL 4 MG/2ML IJ SOLN
INTRAMUSCULAR | Status: AC
  Filled 2020-11-22: qty 4

## 2020-11-22 MED ORDER — SODIUM CHLORIDE 0.9 % IV SOLN
10.0000 mg | Freq: Once | INTRAVENOUS | Status: AC
  Administered 2020-11-22: 10 mg via INTRAVENOUS
  Filled 2020-11-22: qty 10

## 2020-11-22 MED ORDER — SODIUM CHLORIDE 0.9 % IV SOLN
800.0000 mg/m2 | Freq: Once | INTRAVENOUS | Status: AC
  Administered 2020-11-22: 1520 mg via INTRAVENOUS
  Filled 2020-11-22: qty 39.98

## 2020-11-22 MED ORDER — PACLITAXEL PROTEIN-BOUND CHEMO INJECTION 100 MG
75.0000 mg/m2 | Freq: Once | INTRAVENOUS | Status: AC
  Administered 2020-11-22: 150 mg via INTRAVENOUS
  Filled 2020-11-22: qty 30

## 2020-11-22 MED ORDER — ONDANSETRON HCL 4 MG/2ML IJ SOLN
8.0000 mg | Freq: Once | INTRAMUSCULAR | Status: AC
  Administered 2020-11-22: 8 mg via INTRAVENOUS

## 2020-11-22 MED ORDER — HEPARIN SOD (PORK) LOCK FLUSH 100 UNIT/ML IV SOLN
500.0000 [IU] | Freq: Once | INTRAVENOUS | Status: AC | PRN
  Administered 2020-11-22: 500 [IU]
  Filled 2020-11-22: qty 5

## 2020-11-22 NOTE — Progress Notes (Signed)
Patient completed infusion without incident. In no visible distress at time of discharge. Assisted out of cancer center via wheelchair. AVS provided.  

## 2020-11-22 NOTE — Patient Instructions (Signed)
[facility] Discharge Instructions for Patients Receiving Chemotherapy  Today you received the following chemotherapy agents gemzar, abraxane  To help prevent nausea and vomiting after your treatment, we encourage you to take your nausea medication as directed.   If you develop nausea and vomiting that is not controlled by your nausea medication, call the clinic.   BELOW ARE SYMPTOMS THAT SHOULD BE REPORTED IMMEDIATELY:  *FEVER GREATER THAN 100.5 F  *CHILLS WITH OR WITHOUT FEVER  NAUSEA AND VOMITING THAT IS NOT CONTROLLED WITH YOUR NAUSEA MEDICATION  *UNUSUAL SHORTNESS OF BREATH  *UNUSUAL BRUISING OR BLEEDING  TENDERNESS IN MOUTH AND THROAT WITH OR WITHOUT PRESENCE OF ULCERS  *URINARY PROBLEMS  *BOWEL PROBLEMS  UNUSUAL RASH Items with * indicate a potential emergency and should be followed up as soon as possible.  Feel free to call the clinic should you have any questions or concerns. The clinic phone number is [phone].  Please show the CHEMO ALERT CARD at check-in to the Emergency Department and triage nurse.   

## 2020-11-22 NOTE — Progress Notes (Signed)
[facility] OFFICE PROGRESS NOTE   Diagnosis: Pancreas cancer  INTERVAL HISTORY:   Gary [name] returns as scheduled.  He was last treated with gemcitabine and Abraxane on 11/01/2020.  No fever or rash.  Good appetite.  No consistent neuropathy symptoms.  He has mild numbness in the left foot at night that resolves with movement. He underwent incision and drainage of the right upper back sebaceous cyst by Dr. Marlou Starks on 11/17/2020.  The cyst feels better.  He underwent a paracentesis for 4 L of fluid on 11/10/2020.  Objective:  Vital signs in last 24 hours:  Blood pressure 127/83, pulse 67, temperature 98 F (36.7 C), temperature source Oral, resp. rate 19, height 5\' 6"  (1.676 m), weight 163 lb 12.8 oz (74.3 kg), SpO2 100 %.    HEENT: No thrush or ulcers Resp: Lungs clear bilaterally Cardio: Regular rate and rhythm GI: Mildly distended, no hepatomegaly, nontender Vascular: Trace-1+ pitting edema at the lower leg and foot bilaterally  Skin: 1 cm open wound at the drained right upper back sebaceous cyst, no surrounding erythema  Portacath/PICC-without erythema  Lab Results:  Lab Results  Component Value Date   WBC 9.3 11/22/2020   HGB 11.7 (L) 11/22/2020   HCT 36.4 (L) 11/22/2020   MCV 86.7 11/22/2020   PLT 317 11/22/2020   NEUTROABS 7.0 11/22/2020    CMP  Lab Results  Component Value Date   NA 137 11/22/2020   K 4.1 11/22/2020   CL 107 11/22/2020   CO2 22 11/22/2020   GLUCOSE 103 (H) 11/22/2020   BUN 22 11/22/2020   CREATININE 0.90 11/22/2020   CALCIUM 8.7 (L) 11/22/2020   PROT 6.7 11/22/2020   ALBUMIN 2.5 (L) 11/22/2020   AST 30 11/22/2020   ALT 18 11/22/2020   ALKPHOS 125 11/22/2020   BILITOT 0.6 11/22/2020   GFRNONAA >60 11/22/2020   GFRAA >60 09/05/2020    No results found for: CEA1  Lab Results  Component Value Date   INR 1.1 05/10/2020    Imaging:  No results found.  Medications: I have reviewed the patient's current  medications.   Assessment/Plan: 1. Pancreas tail lesion, multiple liver lesions   Chest CT 04/12/2020-ascending thoracic aortic aneurysm; hypovascular lesion tail of the pancreas, new compared to the prior examination. Multiple poorly defined hypovascular lesions scattered throughout the liver   CT abdomen/pelvis 05/02/2020-hypoenhancing lesion of the pancreatic tail measuring approximately 2.4 x 1.9 cm; enlarged portacaval lymph nodes measuring up to 1.9 x 1.4 cm; numerous hypodense lesions throughout the liver, index lesion of the liver dome measuring 1.7 x 1.2 cm.  Ultrasound-guided biopsy of a left liver lesion 05/10/2020-poorly differentiated carcinoma consistent with a pancreatobiliary primary  Markedly elevated CA 19-9  Cycle 1day 1gemcitabine/Abraxane 05/26/2020  Cycle1 day 15gemcitabine/Abraxane 06/12/2020  Cycle2 day 1gemcitabine/Abraxane 07/04/2020(chemotherapy doses reduced, antiemetic regimen adjusted)  Cycle2 day 15Gemcitabine/Abraxane 07/18/2020(continue same dose reduction, reduce post-treatment steroids due to insomnia)  Cycle 3 day 1 gem/abraxane 08/02/20  Cycle 3 day 15 gem/Abraxane 08/15/2020  CTs abdomen/pelvis 08/25/2020-pancreas tail mass and portacaval lymphadenopathy decreased.  Liver metastases reported as increased, review requested.  Cycle 4-day 1 gemcitabine/Abraxane 09/05/2020  Paracentesis 09/26/2020-reactive mesothelial cells  Paracentesis 10/09/2020-reactive mesothelial cells  Cycle 5 gemcitabine/Abraxane 10/12/2020  Cycle 6 gemcitabine/Abraxane 11/01/2020  Cycle 7 gemcitabine/Abraxane 11/22/2020  2. Thoracic aortic aneurysm 3. Hypertension 4. Hyperlipidemia 5. History of bradycardia status post permanent pacemaker 6. Mother hadcervicalcancer 7. Pitting lower leg edema 8. GERD, on omeprazole 9. Right upper back inflamed sebaceous  cyst-culture from cyst drainage 10/23/2020-2 staph species, antibiotic changed to ciprofloxacin 10/27/2020,  status post I&D procedure 11/17/2020     Disposition: [name] appears stable.  He will complete another treatment with gemcitabine and Abraxane today.  The CA 19-9 was lower on 11/01/2020.  There is no clinical evidence of disease progression.  We will consider scheduling a restaging CT for within the next few months.  He will return for an office visit in the next treatment with gemcitabine/Abraxane on 12/13/2020.  The right upper back sebaceous cyst appears to be healing.  Betsy Coder, MD  11/22/2020  2:19 PM

## 2020-11-23 ENCOUNTER — Telehealth: Payer: Self-pay | Admitting: Oncology

## 2020-11-23 LAB — CANCER ANTIGEN 19-9: CA 19-9: 4098 U/mL — ABNORMAL HIGH (ref 0–35)

## 2020-11-23 NOTE — Telephone Encounter (Signed)
Scheduled appointments per 12/8 los. Spoke to patient's wife who is aware of appointments date and times.

## 2020-11-28 ENCOUNTER — Telehealth: Payer: Self-pay | Admitting: Cardiovascular Disease

## 2020-11-28 NOTE — Telephone Encounter (Signed)
Routed to MD as Juluis Rainier  [facility] 11/18-having edema, possibly related to metastatic pancreatic cancer vs. Cardiac reason.  Echo ordered at [facility]-order "completed" no report.

## 2020-11-28 NOTE — Telephone Encounter (Signed)
    Pt daughter calling, she said pt asked her to leave a message to Dr. Loletha Grayer that his swelling on his legs and feet is not from his heart but it's from his cancer. They just wanted to update Dr. Loletha Grayer

## 2020-11-28 NOTE — Telephone Encounter (Signed)
I would prefer he have the echo, but will leave it up to him.

## 2020-11-30 ENCOUNTER — Telehealth: Payer: Self-pay | Admitting: *Deleted

## 2020-11-30 DIAGNOSIS — R140 Abdominal distension (gaseous): Secondary | ICD-10-CM

## 2020-11-30 DIAGNOSIS — C252 Malignant neoplasm of tail of pancreas: Secondary | ICD-10-CM

## 2020-11-30 NOTE — Telephone Encounter (Signed)
Daughter left VM that he needs a paracentesis today or tomorrow. Called daughter back with paracentesis for 12/01/20 at [facility] at 4:15/4:30 pm.

## 2020-12-01 ENCOUNTER — Ambulatory Visit (HOSPITAL_COMMUNITY)
Admission: RE | Admit: 2020-12-01 | Discharge: 2020-12-01 | Disposition: A | Discharge status: Home / Self Care | Payer: Medicare Other | Source: Ambulatory Visit | Attending: Oncology | Admitting: Oncology

## 2020-12-01 ENCOUNTER — Other Ambulatory Visit: Payer: Self-pay

## 2020-12-01 ENCOUNTER — Ambulatory Visit (HOSPITAL_COMMUNITY): Payer: Medicare Other

## 2020-12-01 DIAGNOSIS — C252 Malignant neoplasm of tail of pancreas: Secondary | ICD-10-CM | POA: Diagnosis not present

## 2020-12-01 DIAGNOSIS — C259 Malignant neoplasm of pancreas, unspecified: Secondary | ICD-10-CM | POA: Diagnosis not present

## 2020-12-01 DIAGNOSIS — R180 Malignant ascites: Secondary | ICD-10-CM | POA: Diagnosis not present

## 2020-12-01 MED ORDER — LIDOCAINE HCL 1 % IJ SOLN
INTRAMUSCULAR | Status: AC
  Filled 2020-12-01: qty 20

## 2020-12-01 NOTE — Procedures (Signed)
Ultrasound-guided therapeutic paracentesis performed yielding 4 liters (maximum ordered) of yellow fluid. No immediate complications.EBL none.   

## 2020-12-10 ENCOUNTER — Other Ambulatory Visit: Payer: Self-pay | Admitting: Oncology

## 2020-12-11 ENCOUNTER — Telehealth: Payer: Self-pay | Admitting: *Deleted

## 2020-12-11 DIAGNOSIS — C252 Malignant neoplasm of tail of pancreas: Secondary | ICD-10-CM

## 2020-12-11 NOTE — Telephone Encounter (Signed)
No availability at [facility] on 12/27 or 12/28. Scheduled w/[facility] on 12/28 at 0945/1000. Daughter reports this is too early for him. Rescheduled for 1345/1400 at [facility].

## 2020-12-11 NOTE — Telephone Encounter (Signed)
Daughter requesting paracentesis today at [facility].

## 2020-12-12 ENCOUNTER — Other Ambulatory Visit: Payer: Self-pay

## 2020-12-12 ENCOUNTER — Ambulatory Visit (HOSPITAL_COMMUNITY)
Admission: RE | Admit: 2020-12-12 | Discharge: 2020-12-12 | Disposition: A | Discharge status: Home / Self Care | Payer: Medicare Other | Source: Ambulatory Visit | Attending: Oncology | Admitting: Oncology

## 2020-12-12 DIAGNOSIS — R180 Malignant ascites: Secondary | ICD-10-CM | POA: Insufficient documentation

## 2020-12-12 DIAGNOSIS — C252 Malignant neoplasm of tail of pancreas: Secondary | ICD-10-CM | POA: Insufficient documentation

## 2020-12-12 DIAGNOSIS — R188 Other ascites: Secondary | ICD-10-CM | POA: Diagnosis not present

## 2020-12-12 HISTORY — PX: IR PARACENTESIS: IMG2679

## 2020-12-12 MED ORDER — LIDOCAINE HCL 1 % IJ SOLN
INTRAMUSCULAR | Status: AC
  Filled 2020-12-12: qty 20

## 2020-12-12 MED ORDER — LIDOCAINE HCL 1 % IJ SOLN
INTRAMUSCULAR | Status: AC | PRN
  Administered 2020-12-12: 20 mL via INTRADERMAL

## 2020-12-12 NOTE — Procedures (Signed)
PROCEDURE SUMMARY:  Successful US guided paracentesis from right abdomen.  Yielded 4.4 L of clear yellow fluid.  No immediate complications.  Pt tolerated well.   EBL < 2 mL  Mickie Kay, NP 12/12/2020 2:52 PM

## 2020-12-13 ENCOUNTER — Inpatient Hospital Stay: Payer: Medicare Other

## 2020-12-13 ENCOUNTER — Encounter: Payer: Self-pay | Admitting: Nurse Practitioner

## 2020-12-13 ENCOUNTER — Other Ambulatory Visit: Payer: Self-pay

## 2020-12-13 ENCOUNTER — Inpatient Hospital Stay (HOSPITAL_BASED_OUTPATIENT_CLINIC_OR_DEPARTMENT_OTHER): Payer: Medicare Other | Admitting: Nurse Practitioner

## 2020-12-13 VITALS — BP 107/68 | HR 97 | Temp 98.1°F | Resp 17 | Ht 66.0 in | Wt 159.8 lb

## 2020-12-13 DIAGNOSIS — C252 Malignant neoplasm of tail of pancreas: Secondary | ICD-10-CM

## 2020-12-13 DIAGNOSIS — I10 Essential (primary) hypertension: Secondary | ICD-10-CM | POA: Diagnosis not present

## 2020-12-13 DIAGNOSIS — I712 Thoracic aortic aneurysm, without rupture: Secondary | ICD-10-CM | POA: Diagnosis not present

## 2020-12-13 DIAGNOSIS — Z95828 Presence of other vascular implants and grafts: Secondary | ICD-10-CM

## 2020-12-13 DIAGNOSIS — Z5111 Encounter for antineoplastic chemotherapy: Secondary | ICD-10-CM | POA: Diagnosis not present

## 2020-12-13 DIAGNOSIS — C787 Secondary malignant neoplasm of liver and intrahepatic bile duct: Secondary | ICD-10-CM | POA: Diagnosis not present

## 2020-12-13 DIAGNOSIS — E785 Hyperlipidemia, unspecified: Secondary | ICD-10-CM | POA: Diagnosis not present

## 2020-12-13 LAB — CMP (CANCER CENTER ONLY)
ALT: 18 U/L (ref 0–44)
AST: 25 U/L (ref 15–41)
Albumin: 2.4 g/dL — ABNORMAL LOW (ref 3.5–5.0)
Alkaline Phosphatase: 115 U/L (ref 38–126)
Anion gap: 5 (ref 5–15)
BUN: 23 mg/dL (ref 8–23)
CO2: 24 mmol/L (ref 22–32)
Calcium: 8.3 mg/dL — ABNORMAL LOW (ref 8.9–10.3)
Chloride: 110 mmol/L (ref 98–111)
Creatinine: 0.87 mg/dL (ref 0.61–1.24)
GFR, Estimated: >60 mL/min (ref >60)
Glucose, Bld: 110 mg/dL — ABNORMAL HIGH (ref 70–99)
Potassium: 3.6 mmol/L (ref 3.5–5.1)
Sodium: 139 mmol/L (ref 135–145)
Total Bilirubin: 0.7 mg/dL (ref 0.3–1.2)
Total Protein: 6.3 g/dL — ABNORMAL LOW (ref 6.5–8.1)

## 2020-12-13 LAB — CBC WITH DIFFERENTIAL (CANCER CENTER ONLY)
Abs Immature Granulocytes: 0.03 10*3/uL (ref 0.00–0.07)
Basophils Absolute: 0.1 10*3/uL (ref 0.0–0.1)
Basophils Relative: 1 %
Eosinophils Absolute: 0 10*3/uL (ref 0.0–0.5)
Eosinophils Relative: 0 %
HCT: 36.6 % — ABNORMAL LOW (ref 39.0–52.0)
Hemoglobin: 12.1 g/dL — ABNORMAL LOW (ref 13.0–17.0)
Immature Granulocytes: 0 %
Lymphocytes Relative: 14 %
Lymphs Abs: 1.2 10*3/uL (ref 0.7–4.0)
MCH: 28.3 pg (ref 26.0–34.0)
MCHC: 33.1 g/dL (ref 30.0–36.0)
MCV: 85.7 fL (ref 80.0–100.0)
Monocytes Absolute: 0.9 10*3/uL (ref 0.1–1.0)
Monocytes Relative: 11 %
Neutro Abs: 6.3 10*3/uL (ref 1.7–7.7)
Neutrophils Relative %: 74 %
Platelet Count: 292 10*3/uL (ref 150–400)
RBC: 4.27 MIL/uL (ref 4.22–5.81)
RDW: 18.5 % — ABNORMAL HIGH (ref 11.5–15.5)
WBC Count: 8.6 10*3/uL (ref 4.0–10.5)
nRBC: 0 % (ref 0.0–0.2)

## 2020-12-13 MED ORDER — ONDANSETRON HCL 4 MG/2ML IJ SOLN
8.0000 mg | Freq: Once | INTRAMUSCULAR | Status: AC
  Administered 2020-12-13: 8 mg via INTRAVENOUS

## 2020-12-13 MED ORDER — ONDANSETRON HCL 4 MG/2ML IJ SOLN
INTRAMUSCULAR | Status: AC
  Filled 2020-12-13: qty 4

## 2020-12-13 MED ORDER — SODIUM CHLORIDE 0.9 % IV SOLN
800.0000 mg/m2 | Freq: Once | INTRAVENOUS | Status: AC
  Administered 2020-12-13: 1520 mg via INTRAVENOUS
  Filled 2020-12-13: qty 39.98

## 2020-12-13 MED ORDER — SODIUM CHLORIDE 0.9% FLUSH
10.0000 mL | INTRAVENOUS | Status: DC | PRN
Start: 2020-12-13 — End: 2020-12-13
  Administered 2020-12-13: 10 mL
  Filled 2020-12-13: qty 10

## 2020-12-13 MED ORDER — PACLITAXEL PROTEIN-BOUND CHEMO INJECTION 100 MG
75.0000 mg/m2 | Freq: Once | INTRAVENOUS | Status: AC
  Administered 2020-12-13: 150 mg via INTRAVENOUS
  Filled 2020-12-13: qty 30

## 2020-12-13 MED ORDER — PALONOSETRON HCL INJECTION 0.25 MG/5ML
INTRAVENOUS | Status: AC
  Filled 2020-12-13: qty 5

## 2020-12-13 MED ORDER — DEXAMETHASONE SODIUM PHOSPHATE 10 MG/ML IJ SOLN
INTRAMUSCULAR | Status: AC
  Filled 2020-12-13: qty 1

## 2020-12-13 MED ORDER — SODIUM CHLORIDE 0.9% FLUSH
10.0000 mL | INTRAVENOUS | Status: DC | PRN
  Administered 2020-12-13: 10 mL
  Filled 2020-12-13: qty 10

## 2020-12-13 MED ORDER — DEXAMETHASONE SODIUM PHOSPHATE 100 MG/10ML IJ SOLN
10.0000 mg | Freq: Once | INTRAMUSCULAR | Status: AC
  Administered 2020-12-13: 10 mg via INTRAVENOUS
  Filled 2020-12-13: qty 10

## 2020-12-13 MED ORDER — SODIUM CHLORIDE 0.9 % IV SOLN
Freq: Once | INTRAVENOUS | Status: AC
  Filled 2020-12-13: qty 250

## 2020-12-13 MED ORDER — HEPARIN SOD (PORK) LOCK FLUSH 100 UNIT/ML IV SOLN
500.0000 [IU] | Freq: Once | INTRAVENOUS | Status: AC | PRN
  Administered 2020-12-13: 500 [IU]
  Filled 2020-12-13: qty 5

## 2020-12-13 NOTE — Patient Instructions (Signed)
[facility] Discharge Instructions for Patients Receiving Chemotherapy  Today you received the following chemotherapy agents: Abraxane, Gemzar   To help prevent nausea and vomiting after your treatment, we encourage you to take your nausea medication as directed.    If you develop nausea and vomiting that is not controlled by your nausea medication, call the clinic.   BELOW ARE SYMPTOMS THAT SHOULD BE REPORTED IMMEDIATELY:  *FEVER GREATER THAN 100.5 F  *CHILLS WITH OR WITHOUT FEVER  NAUSEA AND VOMITING THAT IS NOT CONTROLLED WITH YOUR NAUSEA MEDICATION  *UNUSUAL SHORTNESS OF BREATH  *UNUSUAL BRUISING OR BLEEDING  TENDERNESS IN MOUTH AND THROAT WITH OR WITHOUT PRESENCE OF ULCERS  *URINARY PROBLEMS  *BOWEL PROBLEMS  UNUSUAL RASH Items with * indicate a potential emergency and should be followed up as soon as possible.  Feel free to call the clinic should you have any questions or concerns. The clinic phone number is [phone].  Please show the CHEMO ALERT CARD at check-in to the Emergency Department and triage nurse.   

## 2020-12-13 NOTE — Progress Notes (Signed)
[facility] OFFICE PROGRESS NOTE   Diagnosis: Pancreas cancer  INTERVAL HISTORY:   Gary [name] returns as scheduled.  He completed another cycle of Gemzar/Abraxane 11/22/2020.  He had a paracentesis yesterday with 4.4 L of fluid removed.  Notes improvement in the abdominal distention.  He denies nausea/vomiting.  No mouth sores.  No diarrhea.  He continues to have a good appetite.  He remains fairly active.  He stumbled earlier today but did not fall.  He continues to note leg swelling.  The swelling improves overnight and with elevation.  Objective:  Vital signs in last 24 hours:  Blood pressure 107/68, pulse 97, temperature 98.1 F (36.7 C), temperature source Oral, resp. rate 17, height 5\' 6"  (1.676 m), weight 159 lb 12.8 oz (72.5 kg), SpO2 100 %.    HEENT: No thrush or ulcers. Resp: Lungs clear bilaterally. Cardio: Regular rate and rhythm. GI: Abdomen is soft, mildly distended.  No hepatomegaly.  Nontender. Vascular: Pitting edema at the lower legs bilaterally. Skin: Mole callused area left medial foot. Port-A-Cath without erythema.  Lab Results:  Lab Results  Component Value Date   WBC 8.6 12/13/2020   HGB 12.1 (L) 12/13/2020   HCT 36.6 (L) 12/13/2020   MCV 85.7 12/13/2020   PLT 292 12/13/2020   NEUTROABS 6.3 12/13/2020    Imaging:  IR Paracentesis  Result Date: 12/12/2020 INDICATION: Patient with a history of metastatic pancreatic cancer and recurrent ascites. Interventional radiology asked to perform a therapeutic paracentesis. EXAM: ULTRASOUND GUIDED PARACENTESIS MEDICATIONS: 1% lidocaine 20 mL COMPLICATIONS: None immediate. PROCEDURE: Informed written consent was obtained from the patient after a discussion of the risks, benefits and alternatives to treatment. A timeout was performed prior to the initiation of the procedure. Initial ultrasound scanning demonstrates a large amount of ascites within the right lower abdominal quadrant. The right lower  abdomen was prepped and draped in the usual sterile fashion. 1% lidocaine was used for local anesthesia. Following this, a 19 gauge, 7-cm, Yueh catheter was introduced. An ultrasound image was saved for documentation purposes. The paracentesis was performed. The catheter was removed and a dressing was applied. The patient tolerated the procedure well without immediate post procedural complication. FINDINGS: A total of approximately 4.4 L of clear yellow fluid was removed. IMPRESSION: Successful ultrasound-guided paracentesis yielding 4.4 liters of peritoneal fluid. Read by: 12/14/2020, NP Electronically Signed   By: Alwyn Ren M.D.   On: 12/12/2020 14:54    Medications: I have reviewed the patient's current medications.  Assessment/Plan: 1. Pancreas tail lesion, multiple liver lesions   Chest CT 04/12/2020-ascending thoracic aortic aneurysm; hypovascular lesion tail of the pancreas, new compared to the prior examination. Multiple poorly defined hypovascular lesions scattered throughout the liver   CT abdomen/pelvis 05/02/2020-hypoenhancing lesion of the pancreatic tail measuring approximately 2.4 x 1.9 cm; enlarged portacaval lymph nodes measuring up to 1.9 x 1.4 cm; numerous hypodense lesions throughout the liver, index lesion of the liver dome measuring 1.7 x 1.2 cm.  Ultrasound-guided biopsy of a left liver lesion 05/10/2020-poorly differentiated carcinoma consistent with a pancreatobiliary primary  Markedly elevated CA 19-9  Cycle 1day 1gemcitabine/Abraxane 05/26/2020  Cycle1 day 15gemcitabine/Abraxane 06/12/2020  Cycle2 day 1gemcitabine/Abraxane 07/04/2020(chemotherapy doses reduced, antiemetic regimen adjusted)  Cycle2 day 15Gemcitabine/Abraxane 07/18/2020(continue same dose reduction, reduce post-treatment steroids due to insomnia)  Cycle 3 day 1 gem/abraxane 08/02/20  Cycle 3 day 15 gem/Abraxane 08/15/2020  CTs abdomen/pelvis 08/25/2020-pancreas tail mass and  portacaval lymphadenopathy decreased. Liver metastases reported as increased, review requested.  Cycle 4-day 1 gemcitabine/Abraxane 09/05/2020  Paracentesis 09/26/2020-reactive mesothelial cells  Paracentesis 10/09/2020-reactive mesothelial cells  Cycle 5 gemcitabine/Abraxane 10/12/2020  Cycle 6 gemcitabine/Abraxane 11/01/2020  Cycle 7 gemcitabine/Abraxane 11/22/2020  Cycle 8 gemcitabine/Abraxane 12/13/2020  2. Thoracic aortic aneurysm 3. Hypertension 4. Hyperlipidemia 5. History of bradycardia status post permanent pacemaker 6. Mother hadcervicalcancer 7. Pitting lower leg edema 8. GERD, on omeprazole 9. Right upper back inflamed sebaceous cyst-culture from cyst drainage 10/23/2020-2 staph species, antibiotic changed to ciprofloxacin 10/27/2020, status post I&D procedure 11/17/2020   Disposition: Gary [name] appears unchanged.  He has completed 7 cycles of gemcitabine/Abraxane.  Most recent CA 19-9 tumor marker was further improved.  There is no clinical evidence of disease progression.  Plan to continue gemcitabine/Abraxane every 3 weeks, cycle 8 today.  We reviewed the CBC and chemistry panel from today.  Labs adequate to proceed as above.  He will return for lab, follow-up, chemotherapy in 3 weeks.  He will contact the office in the interim with any problems.    Ned Card ANP/GNP-BC   12/13/2020  1:04 PM

## 2020-12-14 ENCOUNTER — Telehealth: Payer: Self-pay | Admitting: Nurse Practitioner

## 2020-12-14 LAB — CANCER ANTIGEN 19-9: CA 19-9: 2766 U/mL — ABNORMAL HIGH (ref 0–35)

## 2020-12-14 NOTE — Telephone Encounter (Signed)
Scheduled appointments per 12/29 los. Spoke to patient's wife who is aware of appointments dates and times.

## 2020-12-19 ENCOUNTER — Telehealth: Payer: Self-pay

## 2020-12-19 NOTE — Telephone Encounter (Signed)
Called patient's daughter Darel Hong and advised per Dr. Truett Perna for him to elevate his extremities, wear compression stockings, decrease salt intake.  She verbalized an understanding.

## 2020-12-19 NOTE — Telephone Encounter (Signed)
Patient's daughter Darel Hong calls stating that his toes and bottoms of his feet are bluish looking, ankles feel tight and his feet are tingling.

## 2020-12-20 ENCOUNTER — Telehealth: Payer: Self-pay

## 2020-12-20 NOTE — Telephone Encounter (Signed)
Patient's daughter calls stating patient wants to do in home physical therapy but needs a prescription in order for Medicare to cover it.  She also wants a recommendation other than [facility] for this.  Her [phone]

## 2020-12-21 ENCOUNTER — Telehealth: Payer: Self-pay | Admitting: *Deleted

## 2020-12-21 DIAGNOSIS — C252 Malignant neoplasm of tail of pancreas: Secondary | ICD-10-CM

## 2020-12-21 NOTE — Telephone Encounter (Signed)
Daughter still concerned regarding the BLE swelling w/tingling in legs. Have been elevating his legs and wearing support hose. Asking what can be done and what has caused it? Informed her it could be related to low protein levels, his lack of activity and ascites. Rarely use diuretic due to risk of dehydration. She is requesting a paracentesis for Monday and will try to increase protein in his diet. Discussed request for PT referral, but she now says this is not urgent and do not want anyone in the home with covid increase.

## 2020-12-22 ENCOUNTER — Other Ambulatory Visit: Payer: Self-pay

## 2020-12-22 ENCOUNTER — Ambulatory Visit (HOSPITAL_COMMUNITY)
Admission: RE | Admit: 2020-12-22 | Discharge: 2020-12-22 | Disposition: A | Discharge status: Home / Self Care | Payer: Medicare Other | Source: Ambulatory Visit | Attending: Oncology | Admitting: Oncology

## 2020-12-22 ENCOUNTER — Telehealth: Payer: Self-pay

## 2020-12-22 ENCOUNTER — Other Ambulatory Visit: Payer: Self-pay | Admitting: *Deleted

## 2020-12-22 DIAGNOSIS — R188 Other ascites: Secondary | ICD-10-CM | POA: Diagnosis not present

## 2020-12-22 DIAGNOSIS — C252 Malignant neoplasm of tail of pancreas: Secondary | ICD-10-CM | POA: Insufficient documentation

## 2020-12-22 MED ORDER — LIDOCAINE HCL 1 % IJ SOLN
INTRAMUSCULAR | Status: AC
  Filled 2020-12-22: qty 20

## 2020-12-22 NOTE — Telephone Encounter (Signed)
Given info for scheduled paracentesis. Given number to call to reschedule if needed. Paracentesis scheduled 12/25/20 at 2:00 pm.

## 2020-12-22 NOTE — Procedures (Signed)
Ultrasound-guided therapeutic paracentesis performed yielding 4 liters (maximum ordered) of yellow fluid. No immediate complications.EBL none.   

## 2020-12-25 ENCOUNTER — Ambulatory Visit (HOSPITAL_COMMUNITY): Payer: Medicare Other

## 2020-12-25 ENCOUNTER — Telehealth: Payer: Self-pay | Admitting: *Deleted

## 2020-12-25 NOTE — Telephone Encounter (Signed)
Daughter left message requesting a paracentesis be scheduled for her father on 01/01/21. MD notified of request.

## 2020-12-26 ENCOUNTER — Other Ambulatory Visit: Payer: Self-pay | Admitting: *Deleted

## 2020-12-26 DIAGNOSIS — C252 Malignant neoplasm of tail of pancreas: Secondary | ICD-10-CM

## 2020-12-26 NOTE — Progress Notes (Signed)
Dr. Benay Spice approved daughter's request for paracentesis for her father on 01/01/21. Scheduled for [facility] on 1/17 at 1045/1100. Notified daughter, who asked for a later time. Provided her the phone # for central scheduling to call to reschedule for a time that suits them.

## 2020-12-31 ENCOUNTER — Other Ambulatory Visit: Payer: Self-pay | Admitting: Oncology

## 2021-01-01 ENCOUNTER — Ambulatory Visit (HOSPITAL_COMMUNITY): Payer: Medicare Other

## 2021-01-02 ENCOUNTER — Other Ambulatory Visit: Payer: Self-pay

## 2021-01-02 ENCOUNTER — Ambulatory Visit (HOSPITAL_COMMUNITY)
Admission: RE | Admit: 2021-01-02 | Discharge: 2021-01-02 | Disposition: A | Discharge status: Home / Self Care | Payer: Medicare Other | Source: Ambulatory Visit | Attending: Oncology | Admitting: Oncology

## 2021-01-02 DIAGNOSIS — R188 Other ascites: Secondary | ICD-10-CM | POA: Diagnosis not present

## 2021-01-02 DIAGNOSIS — C252 Malignant neoplasm of tail of pancreas: Secondary | ICD-10-CM | POA: Insufficient documentation

## 2021-01-02 MED ORDER — LIDOCAINE HCL 1 % IJ SOLN
INTRAMUSCULAR | Status: AC
  Filled 2021-01-02: qty 20

## 2021-01-02 NOTE — Procedures (Signed)
  PROCEDURE SUMMARY:  Successful US guided paracentesis from RLQ.  Yielded 3.5L of clear yellow fluid.  No immediate complications.  Pt tolerated well.   Specimen was not sent for labs.  EBL < 2mL  Ascencion Dike PA-C 01/02/2021 3:17 PM

## 2021-01-03 ENCOUNTER — Inpatient Hospital Stay (HOSPITAL_BASED_OUTPATIENT_CLINIC_OR_DEPARTMENT_OTHER): Payer: Medicare Other | Admitting: Nurse Practitioner

## 2021-01-03 ENCOUNTER — Inpatient Hospital Stay: Payer: Medicare Other | Attending: Oncology

## 2021-01-03 ENCOUNTER — Other Ambulatory Visit: Payer: Self-pay

## 2021-01-03 ENCOUNTER — Inpatient Hospital Stay: Payer: Medicare Other

## 2021-01-03 ENCOUNTER — Encounter: Payer: Self-pay | Admitting: Nurse Practitioner

## 2021-01-03 VITALS — BP 102/63 | HR 68 | Temp 96.4°F | Resp 18 | Ht 66.0 in | Wt 159.0 lb

## 2021-01-03 DIAGNOSIS — C252 Malignant neoplasm of tail of pancreas: Secondary | ICD-10-CM | POA: Insufficient documentation

## 2021-01-03 DIAGNOSIS — K219 Gastro-esophageal reflux disease without esophagitis: Secondary | ICD-10-CM | POA: Insufficient documentation

## 2021-01-03 DIAGNOSIS — Z5111 Encounter for antineoplastic chemotherapy: Secondary | ICD-10-CM | POA: Diagnosis not present

## 2021-01-03 DIAGNOSIS — I10 Essential (primary) hypertension: Secondary | ICD-10-CM | POA: Insufficient documentation

## 2021-01-03 DIAGNOSIS — I712 Thoracic aortic aneurysm, without rupture: Secondary | ICD-10-CM | POA: Diagnosis not present

## 2021-01-03 DIAGNOSIS — Z95828 Presence of other vascular implants and grafts: Secondary | ICD-10-CM

## 2021-01-03 DIAGNOSIS — C787 Secondary malignant neoplasm of liver and intrahepatic bile duct: Secondary | ICD-10-CM | POA: Diagnosis not present

## 2021-01-03 DIAGNOSIS — E785 Hyperlipidemia, unspecified: Secondary | ICD-10-CM | POA: Insufficient documentation

## 2021-01-03 LAB — CBC WITH DIFFERENTIAL (CANCER CENTER ONLY)
Abs Immature Granulocytes: 0.03 10*3/uL (ref 0.00–0.07)
Basophils Absolute: 0 10*3/uL (ref 0.0–0.1)
Basophils Relative: 0 %
Eosinophils Absolute: 0 10*3/uL (ref 0.0–0.5)
Eosinophils Relative: 0 %
HCT: 35 % — ABNORMAL LOW (ref 39.0–52.0)
Hemoglobin: 11.4 g/dL — ABNORMAL LOW (ref 13.0–17.0)
Immature Granulocytes: 0 %
Lymphocytes Relative: 14 %
Lymphs Abs: 1.3 10*3/uL (ref 0.7–4.0)
MCH: 28.4 pg (ref 26.0–34.0)
MCHC: 32.6 g/dL (ref 30.0–36.0)
MCV: 87.3 fL (ref 80.0–100.0)
Monocytes Absolute: 1 10*3/uL (ref 0.1–1.0)
Monocytes Relative: 11 %
Neutro Abs: 6.7 10*3/uL (ref 1.7–7.7)
Neutrophils Relative %: 75 %
Platelet Count: 293 10*3/uL (ref 150–400)
RBC: 4.01 MIL/uL — ABNORMAL LOW (ref 4.22–5.81)
RDW: 19 % — ABNORMAL HIGH (ref 11.5–15.5)
WBC Count: 9.1 10*3/uL (ref 4.0–10.5)
nRBC: 0 % (ref 0.0–0.2)

## 2021-01-03 LAB — CMP (CANCER CENTER ONLY)
ALT: 19 U/L (ref 0–44)
AST: 25 U/L (ref 15–41)
Albumin: 2.3 g/dL — ABNORMAL LOW (ref 3.5–5.0)
Alkaline Phosphatase: 119 U/L (ref 38–126)
Anion gap: 8 (ref 5–15)
BUN: 32 mg/dL — ABNORMAL HIGH (ref 8–23)
CO2: 21 mmol/L — ABNORMAL LOW (ref 22–32)
Calcium: 8.3 mg/dL — ABNORMAL LOW (ref 8.9–10.3)
Chloride: 109 mmol/L (ref 98–111)
Creatinine: 0.86 mg/dL (ref 0.61–1.24)
GFR, Estimated: >60 mL/min (ref >60)
Glucose, Bld: 109 mg/dL — ABNORMAL HIGH (ref 70–99)
Potassium: 4.1 mmol/L (ref 3.5–5.1)
Sodium: 138 mmol/L (ref 135–145)
Total Bilirubin: 0.7 mg/dL (ref 0.3–1.2)
Total Protein: 6.2 g/dL — ABNORMAL LOW (ref 6.5–8.1)

## 2021-01-03 MED ORDER — SODIUM CHLORIDE 0.9 % IV SOLN
10.0000 mg | Freq: Once | INTRAVENOUS | Status: AC
  Administered 2021-01-03: 10 mg via INTRAVENOUS
  Filled 2021-01-03: qty 10

## 2021-01-03 MED ORDER — HEPARIN SOD (PORK) LOCK FLUSH 100 UNIT/ML IV SOLN
500.0000 [IU] | Freq: Once | INTRAVENOUS | Status: AC | PRN
  Administered 2021-01-03: 500 [IU]
  Filled 2021-01-03: qty 5

## 2021-01-03 MED ORDER — PACLITAXEL PROTEIN-BOUND CHEMO INJECTION 100 MG
75.0000 mg/m2 | Freq: Once | INTRAVENOUS | Status: AC
  Administered 2021-01-03: 150 mg via INTRAVENOUS
  Filled 2021-01-03: qty 30

## 2021-01-03 MED ORDER — ONDANSETRON HCL 4 MG/2ML IJ SOLN
8.0000 mg | Freq: Once | INTRAMUSCULAR | Status: AC
  Administered 2021-01-03: 8 mg via INTRAVENOUS

## 2021-01-03 MED ORDER — GEMCITABINE HCL CHEMO INJECTION 1 GM/26.3ML
800.0000 mg/m2 | Freq: Once | INTRAVENOUS | Status: AC
  Administered 2021-01-03: 1520 mg via INTRAVENOUS
  Filled 2021-01-03: qty 39.98

## 2021-01-03 MED ORDER — SODIUM CHLORIDE 0.9% FLUSH
10.0000 mL | INTRAVENOUS | Status: DC | PRN
  Administered 2021-01-03: 10 mL
  Filled 2021-01-03: qty 10

## 2021-01-03 MED ORDER — SODIUM CHLORIDE 0.9 % IV SOLN
Freq: Once | INTRAVENOUS | Status: AC
  Filled 2021-01-03: qty 250

## 2021-01-03 MED ORDER — ONDANSETRON HCL 4 MG/2ML IJ SOLN
INTRAMUSCULAR | Status: AC
  Filled 2021-01-03: qty 4

## 2021-01-03 NOTE — Progress Notes (Signed)
Alaris pump was not communicating properly and RN had to manually program gemcitabine. Manual pump information was verified by Junie Panning, RN.

## 2021-01-03 NOTE — Progress Notes (Addendum)
Gary [name] OFFICE PROGRESS NOTE   Diagnosis: Pancreas cancer  INTERVAL HISTORY:   Gary [name] returns as scheduled.  He completed another cycle of gemcitabine/Abraxane 12/13/2020.  He denies nausea/vomiting.  No mouth sores.  Bowels moving regularly.  He feels better after having a paracentesis yesterday.  He denies neuropathy symptoms.  He intermittently notes a pinching sensation near the Port-A-Cath when he lays in a certain position.  Objective:  Vital signs in last 24 hours:  Blood pressure 102/63, pulse 68, temperature (!) 96.4 F (35.8 C), temperature source Tympanic, resp. rate 18, height 5\' 6"  (1.676 m), weight 159 lb (72.1 kg), SpO2 100 %.    HEENT: No thrush or ulcers. Resp: Lungs clear bilaterally. Cardio: Regular rate and rhythm. GI: Abdomen soft, distended consistent with ascites. Vascular: Pitting edema at the lower legs bilaterally. Port-A-Cath without erythema.  Lab Results:  Lab Results  Component Value Date   WBC 9.1 01/03/2021   HGB 11.4 (L) 01/03/2021   HCT 35.0 (L) 01/03/2021   MCV 87.3 01/03/2021   PLT 293 01/03/2021   NEUTROABS 6.7 01/03/2021    Imaging:  US Paracentesis  Result Date: 01/02/2021 INDICATION: History of pancreatic cancer with recurrent ascites. Request for therapeutic paracentesis up to 4 L max. EXAM: ULTRASOUND GUIDED RIGHT LOWER QUADRANT PARACENTESIS MEDICATIONS: 1% plain lidocaine, 5 mL COMPLICATIONS: None immediate. PROCEDURE: Informed written consent was obtained from the patient after a discussion of the risks, benefits and alternatives to treatment. A timeout was performed prior to the initiation of the procedure. Initial ultrasound scanning demonstrates a large amount of ascites within the right lower abdominal quadrant. The right lower abdomen was prepped and draped in the usual sterile fashion. 1% lidocaine was used for local anesthesia. Following this, a 19 gauge, 7-cm, Yueh catheter was introduced. An  ultrasound image was saved for documentation purposes. The paracentesis was performed. The catheter was removed and a dressing was applied. The patient tolerated the procedure well without immediate post procedural complication. FINDINGS: A total of approximately 3.5 L of clear yellow fluid was removed. IMPRESSION: Successful ultrasound-guided paracentesis yielding 3.5 liters of peritoneal fluid. Read by: Ascencion Dike PA-C Electronically Signed   By: Markus Daft M.D.   On: 01/02/2021 15:18    Medications: I have reviewed the patient's current medications.  Assessment/Plan: 1. Pancreas tail lesion, multiple liver lesions   Chest CT 04/12/2020-ascending thoracic aortic aneurysm; hypovascular lesion tail of the pancreas, new compared to the prior examination. Multiple poorly defined hypovascular lesions scattered throughout the liver   CT abdomen/pelvis 05/02/2020-hypoenhancing lesion of the pancreatic tail measuring approximately 2.4 x 1.9 cm; enlarged portacaval lymph nodes measuring up to 1.9 x 1.4 cm; numerous hypodense lesions throughout the liver, index lesion of the liver dome measuring 1.7 x 1.2 cm.  Ultrasound-guided biopsy of a left liver lesion 05/10/2020-poorly differentiated carcinoma consistent with a pancreatobiliary primary  Markedly elevated CA 19-9  Cycle 1day 1gemcitabine/Abraxane 05/26/2020  Cycle1 day 15gemcitabine/Abraxane 06/12/2020  Cycle2 day 1gemcitabine/Abraxane 07/04/2020(chemotherapy doses reduced, antiemetic regimen adjusted)  Cycle2 day 15Gemcitabine/Abraxane 07/18/2020(continue same dose reduction, reduce post-treatment steroids due to insomnia)  Cycle 3 day 1 gem/abraxane 08/02/20  Cycle 3 day 15 gem/Abraxane 08/15/2020  CTs abdomen/pelvis 08/25/2020-pancreas tail mass and portacaval lymphadenopathy decreased. Liver metastases reported as increased, further review with no significant change in the majority of liver lesions and a decrease in the size of a  few lesions, no new margin lesions  Cycle 4-day 1 gemcitabine/Abraxane 09/05/2020  Paracentesis 09/26/2020-reactive mesothelial cells  Paracentesis 10/09/2020-reactive mesothelial cells  Cycle 5 gemcitabine/Abraxane 10/12/2020  Cycle 6 gemcitabine/Abraxane 11/01/2020  Cycle 7 gemcitabine/Abraxane 11/22/2020  Cycle 8 gemcitabine/Abraxane 12/13/2020  Cycle 9 gemcitabine/Abraxane 01/03/2021  2. Thoracic aortic aneurysm 3. Hypertension 4. Hyperlipidemia 5. History of bradycardia status post permanent pacemaker 6. Mother hadcervicalcancer 7. Pitting lower leg edema 8. GERD, on omeprazole 9. Right upper back inflamed sebaceous cyst-culture from cyst drainage 10/23/2020-2 staph species, antibiotic changed to ciprofloxacin 10/27/2020, status post I&D procedure 11/17/2020   Disposition: Gary [name] appears unchanged.  There is no clinical evidence of disease progression.  Plan to continue gemcitabine/Abraxane, adjust to a 4-week schedule per his request.  Restaging CTs prior to his next visit.  We reviewed the CBC and chemistry panel from today.  Labs adequate to proceed as above.  He will return for lab, follow-up, gemcitabine/Abraxane in 4 weeks, CT scans a few days prior.  Patient seen with Dr. Benay Spice.    Ned Card ANP/GNP-BC   01/03/2021  1:18 PM  This was a shared visit with Ned Card.  Gary [name] continues to tolerate the chemotherapy well.  His overall clinical status is unchanged.  He has refractory ascites clear etiology.  He will complete another treatment with gemcitabine/Abraxane today.  He will undergo a restaging CT prior to an office visit in 4 weeks.  I performed medical decision making with the visit today.  Julieanne Manson, MD

## 2021-01-03 NOTE — Patient Instructions (Signed)
[facility] Discharge Instructions for Patients Receiving Chemotherapy  Today you received the following chemotherapy agents: Abraxane, Gemzar   To help prevent nausea and vomiting after your treatment, we encourage you to take your nausea medication as directed.    If you develop nausea and vomiting that is not controlled by your nausea medication, call the clinic.   BELOW ARE SYMPTOMS THAT SHOULD BE REPORTED IMMEDIATELY:  *FEVER GREATER THAN 100.5 F  *CHILLS WITH OR WITHOUT FEVER  NAUSEA AND VOMITING THAT IS NOT CONTROLLED WITH YOUR NAUSEA MEDICATION  *UNUSUAL SHORTNESS OF BREATH  *UNUSUAL BRUISING OR BLEEDING  TENDERNESS IN MOUTH AND THROAT WITH OR WITHOUT PRESENCE OF ULCERS  *URINARY PROBLEMS  *BOWEL PROBLEMS  UNUSUAL RASH Items with * indicate a potential emergency and should be followed up as soon as possible.  Feel free to call the clinic should you have any questions or concerns. The clinic phone number is [phone].  Please show the CHEMO ALERT CARD at check-in to the Emergency Department and triage nurse.   

## 2021-01-04 ENCOUNTER — Telehealth: Payer: Self-pay | Admitting: Oncology

## 2021-01-04 LAB — CANCER ANTIGEN 19-9: CA 19-9: 2314 U/mL — ABNORMAL HIGH (ref 0–35)

## 2021-01-04 NOTE — Telephone Encounter (Signed)
Scheduled appointments per 1/19 los. Called patient, no answer. Left message with appointments dates and times  °

## 2021-01-08 ENCOUNTER — Other Ambulatory Visit: Payer: Self-pay | Admitting: *Deleted

## 2021-01-08 DIAGNOSIS — C252 Malignant neoplasm of tail of pancreas: Secondary | ICD-10-CM

## 2021-01-09 ENCOUNTER — Ambulatory Visit (HOSPITAL_COMMUNITY): Payer: Medicare Other

## 2021-01-11 ENCOUNTER — Ambulatory Visit (HOSPITAL_COMMUNITY)
Admission: RE | Admit: 2021-01-11 | Discharge: 2021-01-11 | Disposition: A | Discharge status: Home / Self Care | Payer: Medicare Other | Source: Ambulatory Visit | Attending: Oncology | Admitting: Oncology

## 2021-01-11 ENCOUNTER — Other Ambulatory Visit: Payer: Self-pay

## 2021-01-11 DIAGNOSIS — C252 Malignant neoplasm of tail of pancreas: Secondary | ICD-10-CM | POA: Insufficient documentation

## 2021-01-11 DIAGNOSIS — R188 Other ascites: Secondary | ICD-10-CM | POA: Diagnosis not present

## 2021-01-11 MED ORDER — LIDOCAINE HCL 1 % IJ SOLN
INTRAMUSCULAR | Status: AC
  Filled 2021-01-11: qty 20

## 2021-01-11 NOTE — Procedures (Signed)
PROCEDURE SUMMARY:  Successful image-guided paracentesis from the right lateral abdomen.  Yielded 6.2 liters of clear gold fluid.  No immediate complications.  EBL < 2 mL. Patient tolerated well.   Specimen was not sent for labs.  Please see imaging section of Epic for full dictation.   Earley Abide PA-C 01/11/2021 2:48 PM

## 2021-01-19 ENCOUNTER — Telehealth: Payer: Self-pay

## 2021-01-19 DIAGNOSIS — C252 Malignant neoplasm of tail of pancreas: Secondary | ICD-10-CM

## 2021-01-19 NOTE — Telephone Encounter (Signed)
Daughter contacted with Pt's IR Paracentesis date and time/01/22/21 at  1 pm (arrive at 12:30 pm).  Daughter  Verbalized understanding.

## 2021-01-22 ENCOUNTER — Other Ambulatory Visit: Payer: Self-pay

## 2021-01-22 ENCOUNTER — Ambulatory Visit (HOSPITAL_COMMUNITY)
Admission: RE | Admit: 2021-01-22 | Discharge: 2021-01-22 | Disposition: A | Discharge status: Home / Self Care | Payer: Medicare Other | Source: Ambulatory Visit | Attending: Oncology | Admitting: Oncology

## 2021-01-22 ENCOUNTER — Telehealth: Payer: Self-pay

## 2021-01-22 DIAGNOSIS — R188 Other ascites: Secondary | ICD-10-CM | POA: Diagnosis not present

## 2021-01-22 DIAGNOSIS — C252 Malignant neoplasm of tail of pancreas: Secondary | ICD-10-CM | POA: Diagnosis not present

## 2021-01-22 HISTORY — PX: IR PARACENTESIS: IMG2679

## 2021-01-22 MED ORDER — MIRTAZAPINE 15 MG PO TABS: 7.5000 mg | ORAL_TABLET | Freq: Every day | ORAL | 0 refills | Status: DC

## 2021-01-22 MED ORDER — LIDOCAINE HCL 1 % IJ SOLN
INTRAMUSCULAR | Status: AC
  Filled 2021-01-22: qty 20

## 2021-01-22 MED ORDER — LIDOCAINE HCL 1 % IJ SOLN
INTRAMUSCULAR | Status: DC | PRN
  Administered 2021-01-22: 10 mL

## 2021-01-22 NOTE — Procedures (Signed)
Ultrasound-guided therapeutic paracentesis performed yielding 4 liters of straw colored fluid. No immediate complications. EBL is none.

## 2021-01-22 NOTE — Progress Notes (Signed)
Called patient's daughter back regarding appetite stimulant and per Dr. Benay Spice he would not recommend Megace due to side effects of edema and risks for blood clots.  Informed her we are sending in Remeron 7.5 mg to be taken at bedtime.  She verbalized an understanding.

## 2021-01-22 NOTE — Telephone Encounter (Signed)
Patient's daughter Bethena Roys calls stating that he has a poor appetite and is asking Dr. Benay Spice or Ned Card NP to prescribe an appetite stimulant like Megace.

## 2021-01-24 ENCOUNTER — Telehealth: Payer: Self-pay | Admitting: *Deleted

## 2021-01-24 NOTE — Telephone Encounter (Signed)
Daughter called to report patient wants home PT for the home, but not with [facility].  Informed her for Medicare to pay, will need face to face encounter with provider and for note to be documented for necessity. This can be done at his next appointment on 01/31/21.

## 2021-01-28 ENCOUNTER — Other Ambulatory Visit: Payer: Self-pay | Admitting: Oncology

## 2021-01-29 ENCOUNTER — Ambulatory Visit (HOSPITAL_COMMUNITY)
Admission: RE | Admit: 2021-01-29 | Discharge: 2021-01-29 | Disposition: A | Discharge status: Home / Self Care | Payer: Medicare Other | Source: Ambulatory Visit | Attending: Nurse Practitioner | Admitting: Nurse Practitioner

## 2021-01-29 ENCOUNTER — Other Ambulatory Visit: Payer: Self-pay | Admitting: *Deleted

## 2021-01-29 ENCOUNTER — Telehealth: Payer: Self-pay | Admitting: *Deleted

## 2021-01-29 ENCOUNTER — Other Ambulatory Visit: Payer: Self-pay

## 2021-01-29 ENCOUNTER — Encounter (HOSPITAL_COMMUNITY): Payer: Self-pay

## 2021-01-29 DIAGNOSIS — C787 Secondary malignant neoplasm of liver and intrahepatic bile duct: Secondary | ICD-10-CM | POA: Diagnosis not present

## 2021-01-29 DIAGNOSIS — C252 Malignant neoplasm of tail of pancreas: Secondary | ICD-10-CM

## 2021-01-29 DIAGNOSIS — C259 Malignant neoplasm of pancreas, unspecified: Secondary | ICD-10-CM | POA: Diagnosis not present

## 2021-01-29 DIAGNOSIS — K8020 Calculus of gallbladder without cholecystitis without obstruction: Secondary | ICD-10-CM | POA: Diagnosis not present

## 2021-01-29 DIAGNOSIS — K449 Diaphragmatic hernia without obstruction or gangrene: Secondary | ICD-10-CM | POA: Diagnosis not present

## 2021-01-29 MED ORDER — HEPARIN SOD (PORK) LOCK FLUSH 100 UNIT/ML IV SOLN: 500.0000 [IU] | Freq: Once | INTRAVENOUS | Status: AC

## 2021-01-29 MED ORDER — HEPARIN SOD (PORK) LOCK FLUSH 100 UNIT/ML IV SOLN
INTRAVENOUS | Status: AC
  Administered 2021-01-29: 500 [IU] via INTRAVENOUS
  Filled 2021-01-29: qty 5

## 2021-01-29 MED ORDER — IOHEXOL 300 MG/ML  SOLN
100.0000 mL | Freq: Once | INTRAMUSCULAR | Status: AC | PRN
  Administered 2021-01-29: 100 mL via INTRAVENOUS

## 2021-01-29 NOTE — Telephone Encounter (Signed)
Daughter requesting paracentesis on Friday, Feb 18th at [facility] after 12:00. Scheduled for 2/18 at [facility] for 1:45/2:00. Daughter notified.

## 2021-01-31 ENCOUNTER — Other Ambulatory Visit: Payer: Self-pay | Admitting: *Deleted

## 2021-01-31 ENCOUNTER — Inpatient Hospital Stay: Payer: Medicare Other

## 2021-01-31 ENCOUNTER — Inpatient Hospital Stay (HOSPITAL_BASED_OUTPATIENT_CLINIC_OR_DEPARTMENT_OTHER): Payer: Medicare Other | Admitting: Oncology

## 2021-01-31 ENCOUNTER — Inpatient Hospital Stay: Payer: Medicare Other | Attending: Oncology

## 2021-01-31 ENCOUNTER — Other Ambulatory Visit: Payer: Self-pay

## 2021-01-31 VITALS — BP 124/77 | HR 64 | Temp 98.7°F | Resp 18 | Ht 66.0 in | Wt 163.6 lb

## 2021-01-31 DIAGNOSIS — C252 Malignant neoplasm of tail of pancreas: Secondary | ICD-10-CM

## 2021-01-31 DIAGNOSIS — K219 Gastro-esophageal reflux disease without esophagitis: Secondary | ICD-10-CM | POA: Diagnosis not present

## 2021-01-31 DIAGNOSIS — E785 Hyperlipidemia, unspecified: Secondary | ICD-10-CM | POA: Diagnosis not present

## 2021-01-31 DIAGNOSIS — Z5111 Encounter for antineoplastic chemotherapy: Secondary | ICD-10-CM | POA: Diagnosis not present

## 2021-01-31 DIAGNOSIS — R188 Other ascites: Secondary | ICD-10-CM | POA: Insufficient documentation

## 2021-01-31 DIAGNOSIS — Z95828 Presence of other vascular implants and grafts: Secondary | ICD-10-CM

## 2021-01-31 DIAGNOSIS — C787 Secondary malignant neoplasm of liver and intrahepatic bile duct: Secondary | ICD-10-CM | POA: Diagnosis not present

## 2021-01-31 DIAGNOSIS — I10 Essential (primary) hypertension: Secondary | ICD-10-CM | POA: Diagnosis not present

## 2021-01-31 LAB — CBC WITH DIFFERENTIAL (CANCER CENTER ONLY)
Abs Immature Granulocytes: 0.02 10*3/uL (ref 0.00–0.07)
Basophils Absolute: 0 10*3/uL (ref 0.0–0.1)
Basophils Relative: 0 %
Eosinophils Absolute: 0.1 10*3/uL (ref 0.0–0.5)
Eosinophils Relative: 1 %
HCT: 33 % — ABNORMAL LOW (ref 39.0–52.0)
Hemoglobin: 10.8 g/dL — ABNORMAL LOW (ref 13.0–17.0)
Immature Granulocytes: 0 %
Lymphocytes Relative: 10 %
Lymphs Abs: 1 10*3/uL (ref 0.7–4.0)
MCH: 29.3 pg (ref 26.0–34.0)
MCHC: 32.7 g/dL (ref 30.0–36.0)
MCV: 89.4 fL (ref 80.0–100.0)
Monocytes Absolute: 1 10*3/uL (ref 0.1–1.0)
Monocytes Relative: 10 %
Neutro Abs: 7.5 10*3/uL (ref 1.7–7.7)
Neutrophils Relative %: 79 %
Platelet Count: 184 10*3/uL (ref 150–400)
RBC: 3.69 MIL/uL — ABNORMAL LOW (ref 4.22–5.81)
RDW: 18.2 % — ABNORMAL HIGH (ref 11.5–15.5)
WBC Count: 9.6 10*3/uL (ref 4.0–10.5)
nRBC: 0 % (ref 0.0–0.2)

## 2021-01-31 LAB — CMP (CANCER CENTER ONLY)
ALT: 18 U/L (ref 0–44)
AST: 27 U/L (ref 15–41)
Albumin: 2.2 g/dL — ABNORMAL LOW (ref 3.5–5.0)
Alkaline Phosphatase: 157 U/L — ABNORMAL HIGH (ref 38–126)
Anion gap: 6 (ref 5–15)
BUN: 46 mg/dL — ABNORMAL HIGH (ref 8–23)
CO2: 19 mmol/L — ABNORMAL LOW (ref 22–32)
Calcium: 8 mg/dL — ABNORMAL LOW (ref 8.9–10.3)
Chloride: 109 mmol/L (ref 98–111)
Creatinine: 1.21 mg/dL (ref 0.61–1.24)
GFR, Estimated: 58 mL/min — ABNORMAL LOW (ref >60)
Glucose, Bld: 113 mg/dL — ABNORMAL HIGH (ref 70–99)
Potassium: 4.3 mmol/L (ref 3.5–5.1)
Sodium: 134 mmol/L — ABNORMAL LOW (ref 135–145)
Total Bilirubin: 0.5 mg/dL (ref 0.3–1.2)
Total Protein: 6.1 g/dL — ABNORMAL LOW (ref 6.5–8.1)

## 2021-01-31 MED ORDER — SODIUM CHLORIDE 0.9 % IV SOLN
Freq: Once | INTRAVENOUS | Status: AC
  Filled 2021-01-31: qty 250

## 2021-01-31 MED ORDER — ONDANSETRON HCL 4 MG/2ML IJ SOLN
INTRAMUSCULAR | Status: AC
  Filled 2021-01-31: qty 4

## 2021-01-31 MED ORDER — SODIUM CHLORIDE 0.9% FLUSH
10.0000 mL | INTRAVENOUS | Status: DC | PRN
  Filled 2021-01-31: qty 10

## 2021-01-31 MED ORDER — SODIUM CHLORIDE 0.9% FLUSH
10.0000 mL | INTRAVENOUS | Status: DC | PRN
  Administered 2021-01-31: 10 mL
  Filled 2021-01-31: qty 10

## 2021-01-31 MED ORDER — SODIUM CHLORIDE 0.9 % IV SOLN
10.0000 mg | Freq: Once | INTRAVENOUS | Status: AC
  Administered 2021-01-31: 10 mg via INTRAVENOUS
  Filled 2021-01-31: qty 10

## 2021-01-31 MED ORDER — PACLITAXEL PROTEIN-BOUND CHEMO INJECTION 100 MG
75.0000 mg/m2 | Freq: Once | INTRAVENOUS | Status: AC
  Administered 2021-01-31: 150 mg via INTRAVENOUS
  Filled 2021-01-31: qty 30

## 2021-01-31 MED ORDER — SPIRONOLACTONE 50 MG PO TABS: 50.0000 mg | ORAL_TABLET | Freq: Every day | ORAL | 2 refills | Status: DC

## 2021-01-31 MED ORDER — SODIUM CHLORIDE 0.9 % IV SOLN
800.0000 mg/m2 | Freq: Once | INTRAVENOUS | Status: AC
  Administered 2021-01-31: 1520 mg via INTRAVENOUS
  Filled 2021-01-31: qty 39.98

## 2021-01-31 MED ORDER — ONDANSETRON HCL 4 MG/2ML IJ SOLN
8.0000 mg | Freq: Once | INTRAMUSCULAR | Status: AC
  Administered 2021-01-31: 8 mg via INTRAVENOUS

## 2021-01-31 NOTE — Progress Notes (Signed)
Script for spironolactone sent to pharmacy per Dr. Benay Spice instruction. Faxed referral for physical therapy to [facility] # [phone] with demographics, office note, recent scan and med list. Daughter agrees to any agency except [facility].

## 2021-01-31 NOTE — Patient Instructions (Signed)
[facility] Discharge Instructions for Patients Receiving Chemotherapy  Today you received the following chemotherapy agents Abraxane, Gemcitabine(Gemzar)  To help prevent nausea and vomiting after your treatment, we encourage you to take your nausea medication as directed.   If you develop nausea and vomiting that is not controlled by your nausea medication, call the clinic.   BELOW ARE SYMPTOMS THAT SHOULD BE REPORTED IMMEDIATELY:  *FEVER GREATER THAN 100.5 F  *CHILLS WITH OR WITHOUT FEVER  NAUSEA AND VOMITING THAT IS NOT CONTROLLED WITH YOUR NAUSEA MEDICATION  *UNUSUAL SHORTNESS OF BREATH  *UNUSUAL BRUISING OR BLEEDING  TENDERNESS IN MOUTH AND THROAT WITH OR WITHOUT PRESENCE OF ULCERS  *URINARY PROBLEMS  *BOWEL PROBLEMS  UNUSUAL RASH Items with * indicate a potential emergency and should be followed up as soon as possible.  Feel free to call the clinic should you have any questions or concerns. The clinic phone number is [phone].  Please show the [place] at check-in to the Emergency Department and triage nurse.

## 2021-01-31 NOTE — Progress Notes (Signed)
Gary [name]   Diagnosis: Pancreas cancer  INTERVAL HISTORY:   Gary [name] completed another cycle of gemcitabine and Abraxane on January 03, 2021.  He continues to undergo palliative paracentesis procedures, last on January 22, 2021 for 4 L. He feels better after a paracentesis. No fever, rash, or neuropathy symptoms. He is ambulatory in the home. He would like to resume physical therapy. He has developed an ulcer at the left foot.   Objective:  Vital signs in last 24 hours:  Blood pressure 124/77, pulse 64, temperature 98.7 F (37.1 C), temperature source Tympanic, resp. rate 18, height 5\' 6"  (1.676 m), weight 163 lb 9.6 oz (74.2 kg), SpO2 96 %.    HEENT: No thrush or ulcers Resp: With scattered coarse end inspiratory rhonchi, no respiratory distress Cardio: Regular rate and rhythm GI: Distended with ascites Vascular: Trace lower leg edema bilaterally  Skin: Trace presacral edema, 1 cm superficial ulcer at the medial aspect of the left first MTP joint  Portacath/PICC-without erythema  Lab Results:  Lab Results  Component Value Date   WBC 9.6 01/31/2021   HGB 10.8 (L) 01/31/2021   HCT 33.0 (L) 01/31/2021   MCV 89.4 01/31/2021   PLT 184 01/31/2021   NEUTROABS 7.5 01/31/2021    CMP  Lab Results  Component Value Date   NA 134 (L) 01/31/2021   K 4.3 01/31/2021   CL 109 01/31/2021   CO2 19 (L) 01/31/2021   GLUCOSE 113 (H) 01/31/2021   BUN 46 (H) 01/31/2021   CREATININE 1.21 01/31/2021   CALCIUM 8.0 (L) 01/31/2021   PROT 6.1 (L) 01/31/2021   ALBUMIN 2.2 (L) 01/31/2021   AST 27 01/31/2021   ALT 18 01/31/2021   ALKPHOS 157 (H) 01/31/2021   BILITOT 0.5 01/31/2021   GFRNONAA 58 (L) 01/31/2021   GFRAA >60 09/05/2020    No results found for: CEA1  Lab Results  Component Value Date   INR 1.1 05/10/2020    Imaging:  CT Abdomen Pelvis W Contrast  Result Date: 01/29/2021 CLINICAL DATA:  Metastatic pancreatic cancer.   Restaging. EXAM: CT ABDOMEN AND PELVIS WITH CONTRAST TECHNIQUE: Multidetector CT imaging of the abdomen and pelvis was performed using the standard protocol following bolus administration of intravenous contrast. CONTRAST:  149mL OMNIPAQUE IOHEXOL 300 MG/ML  SOLN COMPARISON:  08/25/2020. FINDINGS: Lower chest: Large hiatal hernia. Hepatobiliary: Diffuse metastatic disease again noted in the liver. Index lesion lateral segment left liver measured previously at 2.2 x 2.0 cm is now 1.8 x 1.3 cm. A second more anterior lesion in the lateral segment left liver measured previously at 2.9 x 1.7 cm now measures 1.3 x 1.1 cm. Index lesion in the posterior right liver measured previously at 2.3 x 2.3 cm is now 1.9 x 1.7 cm. Gallstones again noted. No intrahepatic or extrahepatic biliary dilation. Pancreas: Hypoattenuating lesion in the pancreatic tail has decreased in the interval measuring 11 x 9 mm today compared to 14 x 13 mm previously. No dilatation of the main duct. No intraparenchymal cyst. No peripancreatic edema. Spleen: No splenomegaly. No focal mass lesion. Adrenals/Urinary Tract: No adrenal nodule or mass. Kidneys unremarkable. No evidence for hydroureter. The urinary bladder appears normal for the degree of distention. Stomach/Bowel: Moderate to large hiatal hernia. Stomach otherwise unremarkable. Duodenum is normally positioned as is the ligament of Treitz. No small bowel wall thickening. No small bowel dilatation. The terminal ileum is normal. The appendix is normal. No gross colonic mass. No colonic wall  thickening. Diverticular changes are noted in the left colon without evidence of diverticulitis. Vascular/Lymphatic: There is abdominal aortic atherosclerosis without aneurysm. There is no gastrohepatic or hepatoduodenal ligament lymphadenopathy. No retroperitoneal or mesenteric lymphadenopathy. Upper normal lymph nodes in the hepatoduodenal ligament are smaller today with index portacaval node measured  previously at 11 mm now measuring 9 mm. No pelvic sidewall lymphadenopathy. Reproductive: Prostate gland is mildly enlarged. Other: Large volume free fluid is progressive in the interval. Musculoskeletal: No worrisome lytic or sclerotic osseous abnormality. L2 compression fracture is stable. IMPRESSION: 1. Mild decrease in the small pancreatic tail mass. 2. Mild interval decrease in size of the multiple hepatic metastases. 3. Hepatoduodenal ligament lymph nodes are decreased in the interval, now measuring upper normal for size. 4. Large volume free intraperitoneal fluid is progressive in the interval. 5. Large hiatal hernia. 6. Cholelithiasis. 7. Left colonic diverticulosis without diverticulitis. 8. Aortic Atherosclerosis (ICD10-I70.0). Electronically Signed   By: Misty Stanley M.D.   On: 01/29/2021 16:26    Medications: I have reviewed the patient's current medications.   Assessment/Plan: 1. Pancreas tail lesion, multiple liver lesions   Chest CT 04/12/2020-ascending thoracic aortic aneurysm; hypovascular lesion tail of the pancreas, new compared to the prior examination. Multiple poorly defined hypovascular lesions scattered throughout the liver   CT abdomen/pelvis 05/02/2020-hypoenhancing lesion of the pancreatic tail measuring approximately 2.4 x 1.9 cm; enlarged portacaval lymph nodes measuring up to 1.9 x 1.4 cm; numerous hypodense lesions throughout the liver, index lesion of the liver dome measuring 1.7 x 1.2 cm.  Ultrasound-guided biopsy of a left liver lesion 05/10/2020-poorly differentiated carcinoma consistent with a pancreatobiliary primary  Markedly elevated CA 19-9  Cycle 1day 1gemcitabine/Abraxane 05/26/2020  Cycle1 day 15gemcitabine/Abraxane 06/12/2020  Cycle2 day 1gemcitabine/Abraxane 07/04/2020(chemotherapy doses reduced, antiemetic regimen adjusted)  Cycle2 day 15Gemcitabine/Abraxane 07/18/2020(continue same dose reduction, reduce post-treatment steroids due to  insomnia)  Cycle 3 day 1 gem/abraxane 08/02/20  Cycle 3 day 15 gem/Abraxane 08/15/2020  CTs abdomen/pelvis 08/25/2020-pancreas tail mass and portacaval lymphadenopathy decreased. Liver metastases reported as increased, further review with no significant change in the majority of liver lesions and a decrease in the size of a few lesions, no new margin lesions  Cycle 4-day 1 gemcitabine/Abraxane 09/05/2020  Paracentesis 09/26/2020-reactive mesothelial cells  Paracentesis 10/09/2020-reactive mesothelial cells  Cycle 5 gemcitabine/Abraxane 10/12/2020  Cycle 6 gemcitabine/Abraxane 11/01/2020  Cycle 7 gemcitabine/Abraxane 11/22/2020  Cycle 8 gemcitabine/Abraxane 12/13/2020  Cycle 9 gemcitabine/Abraxane 01/03/2021  CT abdomen/pelvis 01/29/2021-decrease in pancreas tail mass, decrease in size of multiple hepatic metastases, decreased size of hepatoduodenal ligament lymph nodes, progressive ascites  Cycle 10 gemcitabine/Abraxane 01/31/2021  2. Thoracic aortic aneurysm 3. Hypertension 4. Hyperlipidemia 5. History of bradycardia status post permanent pacemaker 6. Mother hadcervicalcancer 7. Pitting lower leg edema 8. GERD, on omeprazole 9. Right upper back inflamed sebaceous cyst-culture from cyst drainage 10/23/2020-2 staph species, antibiotic changed to ciprofloxacin 10/27/2020, status post I&D procedure 11/17/2020     Disposition: Gary [name] appears unchanged. He is tolerating gemcitabine/Abraxane well. The restaging CT is consistent with a response to therapy. The CA 19-9 was lower on 01/03/2021. I reviewed the CT images with Gary [name] and his son. I recommend continuing gemcitabine/Abraxane. He would like to continue treatment on a monthly schedule.  He has refractory ascites of unclear etiology. He is scheduled for paracentesis later this week. He will begin a trial of spironolactone.  He will elevate the foot, use Neosporin, and avoid pressure at the left foot ulcer.  Mr.  [name] will benefit from continuing  physical therapy for strength training and help with ambulation. We will make a referral today.  Betsy Coder, MD  01/31/2021  12:33 PM

## 2021-01-31 NOTE — Progress Notes (Signed)
Received VM from [place] [facility]. They will be able to accept him and she will reach out to daughter soon.

## 2021-01-31 NOTE — Patient Instructions (Signed)
Implanted Port Insertion, Care After This sheet gives you information about how to care for yourself after your procedure. Your health care provider may also give you more specific instructions. If you have problems or questions, contact your health care provider. What can I expect after the procedure? After the procedure, it is common to have:  Discomfort at the port insertion site.  Bruising on the skin over the port. This should improve over 3-4 days. Follow these instructions at home: Port care  After your port is placed, you will get a manufacturer's information card. The card has information about your port. Keep this card with you at all times.  Take care of the port as told by your health care provider. Ask your health care provider if you or a family member can get training for taking care of the port at home. A home health care nurse may also take care of the port.  Make sure to remember what type of port you have. Incision care  Follow instructions from your health care provider about how to take care of your port insertion site. Make sure you: ? Wash your hands with soap and water before and after you change your bandage (dressing). If soap and water are not available, use hand sanitizer. ? Change your dressing as told by your health care provider. ? Leave stitches (sutures), skin glue, or adhesive strips in place. These skin closures may need to stay in place for 2 weeks or longer. If adhesive strip edges start to loosen and curl up, you may trim the loose edges. Do not remove adhesive strips completely unless your health care provider tells you to do that.  Check your port insertion site every day for signs of infection. Check for: ? Redness, swelling, or pain. ? Fluid or blood. ? Warmth. ? Pus or a bad smell.      Activity  Return to your normal activities as told by your health care provider. Ask your health care provider what activities are safe for you.  Do not  lift anything that is heavier than 10 lb (4.5 kg), or the limit that you are told, until your health care provider says that it is safe. General instructions  Take over-the-counter and prescription medicines only as told by your health care provider.  Do not take baths, swim, or use a hot tub until your health care provider approves. Ask your health care provider if you may take showers. You may only be allowed to take sponge baths.  Do not drive for 24 hours if you were given a sedative during your procedure.  Wear a medical alert bracelet in case of an emergency. This will tell any health care providers that you have a port.  Keep all follow-up visits as told by your health care provider. This is important. Contact a health care provider if:  You cannot flush your port with saline as directed, or you cannot draw blood from the port.  You have a fever or chills.  You have redness, swelling, or pain around your port insertion site.  You have fluid or blood coming from your port insertion site.  Your port insertion site feels warm to the touch.  You have pus or a bad smell coming from the port insertion site. Get help right away if:  You have chest pain or shortness of breath.  You have bleeding from your port that you cannot control. Summary  Take care of the port as told by your   health care provider. Keep the manufacturer's information card with you at all times.  Change your dressing as told by your health care provider.  Contact a health care provider if you have a fever or chills or if you have redness, swelling, or pain around your port insertion site.  Keep all follow-up visits as told by your health care provider. This information is not intended to replace advice given to you by your health care provider. Make sure you discuss any questions you have with your health care provider. Document Revised: 06/30/2018 Document Reviewed: 06/30/2018 Elsevier Patient Education   2021 Elsevier Inc.  

## 2021-02-01 ENCOUNTER — Telehealth: Payer: Self-pay | Admitting: *Deleted

## 2021-02-01 ENCOUNTER — Telehealth: Payer: Self-pay | Admitting: Oncology

## 2021-02-01 NOTE — Telephone Encounter (Signed)
Scheduled appointments per 2/16 los. Spoke to patient's wife who is aware of appointments date and times.

## 2021-02-01 NOTE — Telephone Encounter (Signed)
Called patient to set up first appointment for PT evaluation this week and patient is requesting to delay until 02/05/21. Asking for verbal order that this is OK. Provided OK to wait till 2/21. She will call afterwards to obtain more orders regarding # of visits needed.

## 2021-02-02 ENCOUNTER — Other Ambulatory Visit: Payer: Self-pay

## 2021-02-02 ENCOUNTER — Telehealth: Payer: Self-pay | Admitting: Physician Assistant

## 2021-02-02 ENCOUNTER — Ambulatory Visit (HOSPITAL_COMMUNITY)
Admission: RE | Admit: 2021-02-02 | Discharge: 2021-02-02 | Disposition: A | Discharge status: Home / Self Care | Payer: Medicare Other | Source: Ambulatory Visit | Attending: Oncology | Admitting: Oncology

## 2021-02-02 DIAGNOSIS — C252 Malignant neoplasm of tail of pancreas: Secondary | ICD-10-CM | POA: Diagnosis not present

## 2021-02-02 DIAGNOSIS — R180 Malignant ascites: Secondary | ICD-10-CM | POA: Insufficient documentation

## 2021-02-02 DIAGNOSIS — R188 Other ascites: Secondary | ICD-10-CM | POA: Diagnosis not present

## 2021-02-02 HISTORY — PX: IR PARACENTESIS: IMG2679

## 2021-02-02 MED ORDER — LIDOCAINE HCL 1 % IJ SOLN
INTRAMUSCULAR | Status: AC
  Filled 2021-02-02: qty 20

## 2021-02-02 MED ORDER — LIDOCAINE HCL (PF) 1 % IJ SOLN
INTRAMUSCULAR | Status: DC | PRN
  Administered 2021-02-02: 10 mL

## 2021-02-02 NOTE — Procedures (Signed)
PROCEDURE SUMMARY:  Successful image-guided paracentesis from the right lateral abdomen.  Yielded 4.0 liters of clear yellow fluid.  No immediate complications.  EBL = 0 mL. Patient tolerated well.   Specimen was not sent for labs.  Please see imaging section of Epic for full dictation.   Earley Abide PA-C 02/02/2021 2:37 PM

## 2021-02-02 NOTE — Telephone Encounter (Signed)
Called pt about Evusheld (tixagevimab co-packaged with cilgavimab) for pre-exposure prophylaxis for prevention of coronavirus disease 2019 (COVID-19) caused by the SARS-CoV-2 virus. The patient is a candidate for this therapy given increased risk for severe disease caused by immunosuppression.    I spoke to wife about the referral Dr. Benay Spice placed for Evusheld and sent her a mychart message with more information. They would like to discuss it and call me back.   Angelena Form PA-C  [facility]

## 2021-02-05 DIAGNOSIS — K8080 Other cholelithiasis without obstruction: Secondary | ICD-10-CM | POA: Diagnosis not present

## 2021-02-05 DIAGNOSIS — K449 Diaphragmatic hernia without obstruction or gangrene: Secondary | ICD-10-CM | POA: Diagnosis not present

## 2021-02-05 DIAGNOSIS — E785 Hyperlipidemia, unspecified: Secondary | ICD-10-CM | POA: Diagnosis not present

## 2021-02-05 DIAGNOSIS — L97529 Non-pressure chronic ulcer of other part of left foot with unspecified severity: Secondary | ICD-10-CM | POA: Diagnosis not present

## 2021-02-05 DIAGNOSIS — C787 Secondary malignant neoplasm of liver and intrahepatic bile duct: Secondary | ICD-10-CM | POA: Diagnosis not present

## 2021-02-05 DIAGNOSIS — K219 Gastro-esophageal reflux disease without esophagitis: Secondary | ICD-10-CM | POA: Diagnosis not present

## 2021-02-05 DIAGNOSIS — R600 Localized edema: Secondary | ICD-10-CM | POA: Diagnosis not present

## 2021-02-05 DIAGNOSIS — I712 Thoracic aortic aneurysm, without rupture: Secondary | ICD-10-CM | POA: Diagnosis not present

## 2021-02-05 DIAGNOSIS — I700 Atherosclerosis of aorta: Secondary | ICD-10-CM | POA: Diagnosis not present

## 2021-02-05 DIAGNOSIS — K5730 Diverticulosis of large intestine without perforation or abscess without bleeding: Secondary | ICD-10-CM | POA: Diagnosis not present

## 2021-02-05 DIAGNOSIS — Z9181 History of falling: Secondary | ICD-10-CM | POA: Diagnosis not present

## 2021-02-05 DIAGNOSIS — I10 Essential (primary) hypertension: Secondary | ICD-10-CM | POA: Diagnosis not present

## 2021-02-05 DIAGNOSIS — C252 Malignant neoplasm of tail of pancreas: Secondary | ICD-10-CM | POA: Diagnosis not present

## 2021-02-05 DIAGNOSIS — Z950 Presence of cardiac pacemaker: Secondary | ICD-10-CM | POA: Diagnosis not present

## 2021-02-05 DIAGNOSIS — R001 Bradycardia, unspecified: Secondary | ICD-10-CM | POA: Diagnosis not present

## 2021-02-06 ENCOUNTER — Telehealth: Payer: Self-pay | Admitting: *Deleted

## 2021-02-06 NOTE — Telephone Encounter (Signed)
Physical therapist left message this am requesting OK to continue PT visits 2/week for 8 weeks. Called back and gave verbal approval per Dr. Benay Spice.

## 2021-02-12 ENCOUNTER — Telehealth: Payer: Self-pay | Admitting: *Deleted

## 2021-02-12 ENCOUNTER — Inpatient Hospital Stay: Payer: Medicare Other

## 2021-02-12 ENCOUNTER — Other Ambulatory Visit: Payer: Self-pay

## 2021-02-12 DIAGNOSIS — Z95828 Presence of other vascular implants and grafts: Secondary | ICD-10-CM

## 2021-02-12 DIAGNOSIS — C252 Malignant neoplasm of tail of pancreas: Secondary | ICD-10-CM

## 2021-02-12 DIAGNOSIS — K219 Gastro-esophageal reflux disease without esophagitis: Secondary | ICD-10-CM | POA: Diagnosis not present

## 2021-02-12 DIAGNOSIS — C787 Secondary malignant neoplasm of liver and intrahepatic bile duct: Secondary | ICD-10-CM | POA: Diagnosis not present

## 2021-02-12 DIAGNOSIS — E785 Hyperlipidemia, unspecified: Secondary | ICD-10-CM | POA: Diagnosis not present

## 2021-02-12 DIAGNOSIS — Z5111 Encounter for antineoplastic chemotherapy: Secondary | ICD-10-CM | POA: Diagnosis not present

## 2021-02-12 DIAGNOSIS — I10 Essential (primary) hypertension: Secondary | ICD-10-CM | POA: Diagnosis not present

## 2021-02-12 LAB — CMP (CANCER CENTER ONLY)
ALT: 18 U/L (ref 0–44)
AST: 21 U/L (ref 15–41)
Albumin: 2.2 g/dL — ABNORMAL LOW (ref 3.5–5.0)
Alkaline Phosphatase: 137 U/L — ABNORMAL HIGH (ref 38–126)
Anion gap: 5 (ref 5–15)
BUN: 42 mg/dL — ABNORMAL HIGH (ref 8–23)
CO2: 20 mmol/L — ABNORMAL LOW (ref 22–32)
Calcium: 8 mg/dL — ABNORMAL LOW (ref 8.9–10.3)
Chloride: 111 mmol/L (ref 98–111)
Creatinine: 1.24 mg/dL (ref 0.61–1.24)
GFR, Estimated: 57 mL/min — ABNORMAL LOW (ref >60)
Glucose, Bld: 99 mg/dL (ref 70–99)
Potassium: 4.4 mmol/L (ref 3.5–5.1)
Sodium: 136 mmol/L (ref 135–145)
Total Bilirubin: 0.5 mg/dL (ref 0.3–1.2)
Total Protein: 5.8 g/dL — ABNORMAL LOW (ref 6.5–8.1)

## 2021-02-12 MED ORDER — HYDROXYZINE HCL 10 MG PO TABS: 10.0000 mg | ORAL_TABLET | Freq: Three times a day (TID) | ORAL | 0 refills | Status: DC | PRN

## 2021-02-12 MED ORDER — HEPARIN SOD (PORK) LOCK FLUSH 100 UNIT/ML IV SOLN
500.0000 [IU] | Freq: Once | INTRAVENOUS | Status: AC | PRN
  Administered 2021-02-12: 500 [IU]
  Filled 2021-02-12: qty 5

## 2021-02-12 MED ORDER — SODIUM CHLORIDE 0.9% FLUSH
10.0000 mL | INTRAVENOUS | Status: DC | PRN
  Administered 2021-02-12: 10 mL
  Filled 2021-02-12: qty 10

## 2021-02-12 NOTE — Telephone Encounter (Signed)
Notified daughter of paracentesis appointment and inquired what Gary [name] has been taking for the itching? Does he have a rash or look yellow? She reports he has no rash,but skin does look yellow to her once she gets him in the light. He has refused to take Benadryl because Dr. Benay Spice did not say he could take it. Per Dr. Benay Spice: Needs CMP today and if bili is elevated most likely will need to see GI (PCP is with [facility]). Confirmed he does not have an internal stent in bile duct. They will bring him in at 3 pm for lab and wants to use his port. Informed daughter OK for Benadryl 25 mg po every 6 hours prn itiching and OK to take 50 mg at bedtime.

## 2021-02-12 NOTE — Telephone Encounter (Signed)
Called daughter back with normal liver functions and bilirubin level. Suggested to try topical moisturizers (could be dry skin); use gentle non=perfumed soap to bathe. Try the OTC Benadryl and if not effective a script for atarax was sent to pharmacy.

## 2021-02-12 NOTE — Telephone Encounter (Addendum)
Daughter left VM requesting paracentesis at [facility] on 01/17/21 after 12:30. OK per Dr. Benay Spice. Also reports he is having significant generalized itching unresponsive to lotions and Benadryl.

## 2021-02-14 ENCOUNTER — Other Ambulatory Visit: Payer: Self-pay

## 2021-02-14 ENCOUNTER — Telehealth: Payer: Self-pay | Admitting: *Deleted

## 2021-02-14 ENCOUNTER — Ambulatory Visit (HOSPITAL_COMMUNITY)
Admission: RE | Admit: 2021-02-14 | Discharge: 2021-02-14 | Disposition: A | Discharge status: Home / Self Care | Payer: Medicare Other | Source: Ambulatory Visit | Attending: Oncology | Admitting: Oncology

## 2021-02-14 DIAGNOSIS — R180 Malignant ascites: Secondary | ICD-10-CM | POA: Diagnosis not present

## 2021-02-14 DIAGNOSIS — C252 Malignant neoplasm of tail of pancreas: Secondary | ICD-10-CM | POA: Diagnosis not present

## 2021-02-14 DIAGNOSIS — R188 Other ascites: Secondary | ICD-10-CM | POA: Diagnosis not present

## 2021-02-14 HISTORY — PX: IR PARACENTESIS: IMG2679

## 2021-02-14 MED ORDER — LIDOCAINE HCL 1 % IJ SOLN
INTRAMUSCULAR | Status: AC
  Filled 2021-02-14: qty 20

## 2021-02-14 MED ORDER — LIDOCAINE HCL (PF) 1 % IJ SOLN
INTRAMUSCULAR | Status: DC | PRN
  Administered 2021-02-14: 10 mL

## 2021-02-14 NOTE — Procedures (Signed)
PROCEDURE SUMMARY:  Successful image-guided paracentesis from the right lower abdomen.  Yielded 4 liters of clear yellow fluid.  No immediate complications.  EBL = trace. Patient tolerated well.   Specimen was not sent for labs.  Please see imaging section of Epic for full dictation.   Armando Gang Saia Derossett PA-C 02/14/2021 1:36 PM

## 2021-02-14 NOTE — Telephone Encounter (Signed)
Wanted office aware that they have had two missed PT visits this week. Daughter states " has not been a good week".

## 2021-02-19 DIAGNOSIS — E785 Hyperlipidemia, unspecified: Secondary | ICD-10-CM | POA: Diagnosis not present

## 2021-02-19 DIAGNOSIS — I10 Essential (primary) hypertension: Secondary | ICD-10-CM | POA: Diagnosis not present

## 2021-02-19 DIAGNOSIS — L97529 Non-pressure chronic ulcer of other part of left foot with unspecified severity: Secondary | ICD-10-CM | POA: Diagnosis not present

## 2021-02-19 DIAGNOSIS — C787 Secondary malignant neoplasm of liver and intrahepatic bile duct: Secondary | ICD-10-CM | POA: Diagnosis not present

## 2021-02-19 DIAGNOSIS — C252 Malignant neoplasm of tail of pancreas: Secondary | ICD-10-CM | POA: Diagnosis not present

## 2021-02-19 DIAGNOSIS — K219 Gastro-esophageal reflux disease without esophagitis: Secondary | ICD-10-CM | POA: Diagnosis not present

## 2021-02-21 ENCOUNTER — Telehealth: Payer: Self-pay | Admitting: *Deleted

## 2021-02-21 DIAGNOSIS — C252 Malignant neoplasm of tail of pancreas: Secondary | ICD-10-CM

## 2021-02-21 NOTE — Telephone Encounter (Signed)
Left VM requesting paracentesis at [facility] on 3/11 after 2:30 pm Scheduled for 1:45/2:00 (last case 2pm at [facility]). Daughter accepted this time.

## 2021-02-22 ENCOUNTER — Telehealth: Payer: Self-pay | Admitting: *Deleted

## 2021-02-22 DIAGNOSIS — K219 Gastro-esophageal reflux disease without esophagitis: Secondary | ICD-10-CM | POA: Diagnosis not present

## 2021-02-22 DIAGNOSIS — L97529 Non-pressure chronic ulcer of other part of left foot with unspecified severity: Secondary | ICD-10-CM | POA: Diagnosis not present

## 2021-02-22 DIAGNOSIS — C787 Secondary malignant neoplasm of liver and intrahepatic bile duct: Secondary | ICD-10-CM | POA: Diagnosis not present

## 2021-02-22 DIAGNOSIS — E785 Hyperlipidemia, unspecified: Secondary | ICD-10-CM | POA: Diagnosis not present

## 2021-02-22 DIAGNOSIS — C252 Malignant neoplasm of tail of pancreas: Secondary | ICD-10-CM | POA: Diagnosis not present

## 2021-02-22 DIAGNOSIS — I10 Essential (primary) hypertension: Secondary | ICD-10-CM | POA: Diagnosis not present

## 2021-02-22 NOTE — Telephone Encounter (Addendum)
Daughter called to report he continues "to itch like mad" all over, but especially his back. Have tried topical Benadryl and OTC Hydrocortisone cream and oral Benadryl. Inquired if they tried the hydroxyzine 10 mg tid that was ordered on 02/12/21. She report they have not. Suggested they try this now.

## 2021-02-23 ENCOUNTER — Ambulatory Visit (HOSPITAL_COMMUNITY)
Admission: RE | Admit: 2021-02-23 | Discharge: 2021-02-23 | Disposition: A | Discharge status: Home / Self Care | Payer: Medicare Other | Source: Ambulatory Visit | Attending: Oncology | Admitting: Oncology

## 2021-02-23 ENCOUNTER — Other Ambulatory Visit: Payer: Self-pay

## 2021-02-23 DIAGNOSIS — Z8507 Personal history of malignant neoplasm of pancreas: Secondary | ICD-10-CM | POA: Diagnosis not present

## 2021-02-23 DIAGNOSIS — R188 Other ascites: Secondary | ICD-10-CM | POA: Insufficient documentation

## 2021-02-23 DIAGNOSIS — C252 Malignant neoplasm of tail of pancreas: Secondary | ICD-10-CM

## 2021-02-23 HISTORY — PX: IR PARACENTESIS: IMG2679

## 2021-02-23 MED ORDER — LIDOCAINE HCL 1 % IJ SOLN
INTRAMUSCULAR | Status: DC | PRN
  Administered 2021-02-23: 10 mL

## 2021-02-23 MED ORDER — LIDOCAINE HCL 1 % IJ SOLN
INTRAMUSCULAR | Status: AC
  Filled 2021-02-23: qty 20

## 2021-02-23 NOTE — Procedures (Signed)
PROCEDURE SUMMARY:  Successful US guided paracentesis from left lateral abdomen.  Yielded 4.0 liters of yellow fluid.  No immediate complications.  Pt tolerated well.   Specimen was not sent for labs.  EBL < 47mL  Docia Barrier PA-C 02/23/2021 3:12 PM

## 2021-02-25 ENCOUNTER — Other Ambulatory Visit: Payer: Self-pay | Admitting: Oncology

## 2021-02-26 ENCOUNTER — Telehealth: Payer: Self-pay | Admitting: *Deleted

## 2021-02-26 DIAGNOSIS — K219 Gastro-esophageal reflux disease without esophagitis: Secondary | ICD-10-CM | POA: Diagnosis not present

## 2021-02-26 DIAGNOSIS — L97529 Non-pressure chronic ulcer of other part of left foot with unspecified severity: Secondary | ICD-10-CM | POA: Diagnosis not present

## 2021-02-26 DIAGNOSIS — E785 Hyperlipidemia, unspecified: Secondary | ICD-10-CM | POA: Diagnosis not present

## 2021-02-26 DIAGNOSIS — C787 Secondary malignant neoplasm of liver and intrahepatic bile duct: Secondary | ICD-10-CM | POA: Diagnosis not present

## 2021-02-26 DIAGNOSIS — I10 Essential (primary) hypertension: Secondary | ICD-10-CM | POA: Diagnosis not present

## 2021-02-26 DIAGNOSIS — C252 Malignant neoplasm of tail of pancreas: Secondary | ICD-10-CM | POA: Diagnosis not present

## 2021-02-26 NOTE — Telephone Encounter (Signed)
Called daughter back and informed we can discuss this at his visit on 02/28/21.

## 2021-02-26 NOTE — Telephone Encounter (Signed)
Daughter, Bethena Roys called to inquire if advisable for Gary [name] to have more fluid removed during paracentesis and receive albumin afterwards? Asking if this should be started soon or wait until next paracentesis?

## 2021-02-27 MED FILL — Dexamethasone Sodium Phosphate Inj 100 MG/10ML: INTRAMUSCULAR | Qty: 1 | Status: AC

## 2021-02-28 ENCOUNTER — Encounter: Payer: Self-pay | Admitting: Nurse Practitioner

## 2021-02-28 ENCOUNTER — Other Ambulatory Visit: Payer: Self-pay

## 2021-02-28 ENCOUNTER — Inpatient Hospital Stay: Payer: Medicare Other | Attending: Oncology

## 2021-02-28 ENCOUNTER — Inpatient Hospital Stay: Payer: Medicare Other

## 2021-02-28 ENCOUNTER — Inpatient Hospital Stay (HOSPITAL_BASED_OUTPATIENT_CLINIC_OR_DEPARTMENT_OTHER): Payer: Medicare Other | Admitting: Nurse Practitioner

## 2021-02-28 VITALS — BP 109/65 | HR 70 | Temp 98.1°F | Resp 17 | Ht 66.0 in | Wt 155.1 lb

## 2021-02-28 DIAGNOSIS — Z9221 Personal history of antineoplastic chemotherapy: Secondary | ICD-10-CM | POA: Diagnosis not present

## 2021-02-28 DIAGNOSIS — Z79899 Other long term (current) drug therapy: Secondary | ICD-10-CM | POA: Insufficient documentation

## 2021-02-28 DIAGNOSIS — C787 Secondary malignant neoplasm of liver and intrahepatic bile duct: Secondary | ICD-10-CM | POA: Diagnosis not present

## 2021-02-28 DIAGNOSIS — I10 Essential (primary) hypertension: Secondary | ICD-10-CM | POA: Insufficient documentation

## 2021-02-28 DIAGNOSIS — Z95828 Presence of other vascular implants and grafts: Secondary | ICD-10-CM | POA: Diagnosis not present

## 2021-02-28 DIAGNOSIS — C252 Malignant neoplasm of tail of pancreas: Secondary | ICD-10-CM

## 2021-02-28 LAB — CBC WITH DIFFERENTIAL (CANCER CENTER ONLY)
Abs Immature Granulocytes: 0.05 10*3/uL (ref 0.00–0.07)
Basophils Absolute: 0.1 10*3/uL (ref 0.0–0.1)
Basophils Relative: 1 %
Eosinophils Absolute: 0.1 10*3/uL (ref 0.0–0.5)
Eosinophils Relative: 2 %
HCT: 32.9 % — ABNORMAL LOW (ref 39.0–52.0)
Hemoglobin: 10.8 g/dL — ABNORMAL LOW (ref 13.0–17.0)
Immature Granulocytes: 1 %
Lymphocytes Relative: 12 %
Lymphs Abs: 1.1 10*3/uL (ref 0.7–4.0)
MCH: 29.3 pg (ref 26.0–34.0)
MCHC: 32.8 g/dL (ref 30.0–36.0)
MCV: 89.4 fL (ref 80.0–100.0)
Monocytes Absolute: 1 10*3/uL (ref 0.1–1.0)
Monocytes Relative: 11 %
Neutro Abs: 6.7 10*3/uL (ref 1.7–7.7)
Neutrophils Relative %: 73 %
Platelet Count: 166 10*3/uL (ref 150–400)
RBC: 3.68 MIL/uL — ABNORMAL LOW (ref 4.22–5.81)
RDW: 17.2 % — ABNORMAL HIGH (ref 11.5–15.5)
WBC Count: 9.1 10*3/uL (ref 4.0–10.5)
nRBC: 0 % (ref 0.0–0.2)

## 2021-02-28 LAB — CMP (CANCER CENTER ONLY)
ALT: 15 U/L (ref 0–44)
AST: 23 U/L (ref 15–41)
Albumin: 2.1 g/dL — ABNORMAL LOW (ref 3.5–5.0)
Alkaline Phosphatase: 142 U/L — ABNORMAL HIGH (ref 38–126)
Anion gap: 4 — ABNORMAL LOW (ref 5–15)
BUN: 41 mg/dL — ABNORMAL HIGH (ref 8–23)
CO2: 23 mmol/L (ref 22–32)
Calcium: 8.1 mg/dL — ABNORMAL LOW (ref 8.9–10.3)
Chloride: 108 mmol/L (ref 98–111)
Creatinine: 1.1 mg/dL (ref 0.61–1.24)
GFR, Estimated: >60 mL/min (ref >60)
Glucose, Bld: 105 mg/dL — ABNORMAL HIGH (ref 70–99)
Potassium: 4.4 mmol/L (ref 3.5–5.1)
Sodium: 135 mmol/L (ref 135–145)
Total Bilirubin: 0.4 mg/dL (ref 0.3–1.2)
Total Protein: 6.1 g/dL — ABNORMAL LOW (ref 6.5–8.1)

## 2021-02-28 MED ORDER — HEPARIN SOD (PORK) LOCK FLUSH 100 UNIT/ML IV SOLN
500.0000 [IU] | Freq: Once | INTRAVENOUS | Status: DC
  Filled 2021-02-28: qty 5

## 2021-02-28 MED ORDER — SODIUM CHLORIDE 0.9% FLUSH
10.0000 mL | INTRAVENOUS | Status: DC | PRN
  Filled 2021-02-28: qty 10

## 2021-02-28 MED ORDER — SODIUM CHLORIDE 0.9% FLUSH
10.0000 mL | INTRAVENOUS | Status: DC | PRN
  Administered 2021-02-28: 10 mL
  Filled 2021-02-28: qty 10

## 2021-02-28 NOTE — Progress Notes (Signed)
[place] OFFICE PROGRESS NOTE   Diagnosis: Pancreas cancer  INTERVAL HISTORY:   Gary [name] returns as scheduled.  He completed another cycle of gemcitabine/Abraxane 01/31/2021.  Most recent paracentesis 02/23/2021, 4 L of fluid removed.  He wonders if more fluid could be removed with the next paracentesis.  We talked about the spironolactone prescribed last month.  He took a single dose and had excessive sedation.  He did not feel he can tolerate spironolactone.  His main complaint is intermittent pruritus involving various locations.  He was unable to tolerate hydroxyzine due to sedation.  He does note improvement in the itching when he applies CeraVe lotion.  He denies nausea.  He is accompanied by his son to today's visit who reports Gary [name] is just now recovering from the chemotherapy 4 weeks ago.  Appetite has improved over the past 2 to 3 days.  They wonder if the chemotherapy could be spaced out further than every 4 weeks.  Neither Gary [name] or his son feel he is up to chemotherapy today.  Objective:  Vital signs in last 24 hours:  Blood pressure 109/65, pulse 70, temperature 98.1 F (36.7 C), temperature source Tympanic, resp. rate 17, height 5\' 6"  (1.676 m), weight 155 lb 1.6 oz (70.4 kg), SpO2 98 %.   Resp: Lungs clear bilaterally. Cardio: Regular rate and rhythm. GI: Abdomen distended consistent with ascites. Vascular: Trace to 1+ pitting edema at the lower leg bilaterally. Skin: No rash. Port-A-Cath without erythema.   Lab Results:  Lab Results  Component Value Date   WBC 9.1 02/28/2021   HGB 10.8 (L) 02/28/2021   HCT 32.9 (L) 02/28/2021   MCV 89.4 02/28/2021   PLT 166 02/28/2021   NEUTROABS 6.7 02/28/2021    Imaging:  No results found.  Medications: I have reviewed the patient's current medications.  Assessment/Plan: 1. Pancreas tail lesion, multiple liver lesions   Chest CT 04/12/2020-ascending thoracic aortic aneurysm; hypovascular  lesion tail of the pancreas, new compared to the prior examination. Multiple poorly defined hypovascular lesions scattered throughout the liver   CT abdomen/pelvis 05/02/2020-hypoenhancing lesion of the pancreatic tail measuring approximately 2.4 x 1.9 cm; enlarged portacaval lymph nodes measuring up to 1.9 x 1.4 cm; numerous hypodense lesions throughout the liver, index lesion of the liver dome measuring 1.7 x 1.2 cm.  Ultrasound-guided biopsy of a left liver lesion 05/10/2020-poorly differentiated carcinoma consistent with a pancreatobiliary primary  Markedly elevated CA 19-9  Cycle 1day 1gemcitabine/Abraxane 05/26/2020  Cycle1 day 15gemcitabine/Abraxane 06/12/2020  Cycle2 day 1gemcitabine/Abraxane 07/04/2020(chemotherapy doses reduced, antiemetic regimen adjusted)  Cycle2 day 15Gemcitabine/Abraxane 07/18/2020(continue same dose reduction, reduce post-treatment steroids due to insomnia)  Cycle 3 day 1 gem/abraxane 08/02/20  Cycle 3 day 15 gem/Abraxane 08/15/2020  CTs abdomen/pelvis 08/25/2020-pancreas tail mass and portacaval lymphadenopathy decreased. Liver metastases reported as increased, further review with no significant change in the majority of liver lesions and a decrease in the size of a few lesions, no new margin lesions  Cycle 4-day 1 gemcitabine/Abraxane 09/05/2020  Paracentesis 09/26/2020-reactive mesothelial cells  Paracentesis 10/09/2020-reactive mesothelial cells  Cycle 5 gemcitabine/Abraxane 10/12/2020  Cycle 6 gemcitabine/Abraxane 11/01/2020  Cycle 7 gemcitabine/Abraxane 11/22/2020  Cycle 8 gemcitabine/Abraxane 12/13/2020  Cycle 9 gemcitabine/Abraxane 01/03/2021  CT abdomen/pelvis 01/29/2021-decrease in pancreas tail mass, decrease in size of multiple hepatic metastases, decreased size of hepatoduodenal ligament lymph nodes, progressive ascites  Cycle 10 gemcitabine/Abraxane 01/31/2021  2. Thoracic aortic  aneurysm 3. Hypertension 4. Hyperlipidemia 5. History of bradycardia status post permanent pacemaker 6. Mother  hadcervicalcancer 7. Pitting lower leg edema 8. GERD, on omeprazole 9. Right upper back inflamed sebaceous cyst-culture from cyst drainage 10/23/2020-2 staph species, antibiotic changed to ciprofloxacin 10/27/2020, status post I&D procedure 11/17/2020   Disposition: Gary [name] appears unchanged.  He is currently on active treatment with gemcitabine/Abraxane on a 4-week schedule, last treatment 01/31/2021.  His performance status is poor, etiology unclear.  Question cumulative toxicity from chemotherapy. Recent restaging CTs showed improvement and most recent CA 19-9 tumor marker was lower.  We decided to hold today's treatment to see if there is any improvement in his overall condition over the next few weeks.  Referrals made to gastroenterology for refractory ascites and dermatology for pruritus.  We reviewed the CBC and chemistry panel results from today.  CA 19-9 is pending.  He will return for follow-up/reevaluation in approximately 3 weeks.  We are available to see him sooner if needed.    Ned Card ANP/GNP-BC   02/28/2021  1:56 PM

## 2021-03-01 ENCOUNTER — Encounter: Payer: Self-pay | Admitting: *Deleted

## 2021-03-01 ENCOUNTER — Telehealth: Payer: Self-pay | Admitting: Internal Medicine

## 2021-03-01 DIAGNOSIS — E785 Hyperlipidemia, unspecified: Secondary | ICD-10-CM | POA: Diagnosis not present

## 2021-03-01 DIAGNOSIS — K219 Gastro-esophageal reflux disease without esophagitis: Secondary | ICD-10-CM | POA: Diagnosis not present

## 2021-03-01 DIAGNOSIS — C252 Malignant neoplasm of tail of pancreas: Secondary | ICD-10-CM | POA: Diagnosis not present

## 2021-03-01 DIAGNOSIS — L97529 Non-pressure chronic ulcer of other part of left foot with unspecified severity: Secondary | ICD-10-CM | POA: Diagnosis not present

## 2021-03-01 DIAGNOSIS — C787 Secondary malignant neoplasm of liver and intrahepatic bile duct: Secondary | ICD-10-CM | POA: Diagnosis not present

## 2021-03-01 DIAGNOSIS — I10 Essential (primary) hypertension: Secondary | ICD-10-CM | POA: Diagnosis not present

## 2021-03-01 LAB — CANCER ANTIGEN 19-9: CA 19-9: 692 U/mL — ABNORMAL HIGH (ref 0–35)

## 2021-03-01 NOTE — Telephone Encounter (Signed)
Father has pancreatic cancer and recurrent ascites.  Dr. Benay Spice requesting appointment.  I called her and we agreed that I could see him next Tuesday at 1150.  No need to call back please make the appointment.  March 22 11:50 AM

## 2021-03-01 NOTE — Telephone Encounter (Signed)
entered

## 2021-03-01 NOTE — Progress Notes (Signed)
Faxed referral order, demographics and chart information to [facility] [phone]. Staff message to Larence Penning w/[facility] to alert to referral order placed to manage ascites.

## 2021-03-02 DIAGNOSIS — L2084 Intrinsic (allergic) eczema: Secondary | ICD-10-CM | POA: Diagnosis not present

## 2021-03-05 ENCOUNTER — Telehealth: Payer: Self-pay | Admitting: *Deleted

## 2021-03-05 DIAGNOSIS — I10 Essential (primary) hypertension: Secondary | ICD-10-CM | POA: Diagnosis not present

## 2021-03-05 DIAGNOSIS — E785 Hyperlipidemia, unspecified: Secondary | ICD-10-CM | POA: Diagnosis not present

## 2021-03-05 DIAGNOSIS — C787 Secondary malignant neoplasm of liver and intrahepatic bile duct: Secondary | ICD-10-CM | POA: Diagnosis not present

## 2021-03-05 DIAGNOSIS — L97529 Non-pressure chronic ulcer of other part of left foot with unspecified severity: Secondary | ICD-10-CM | POA: Diagnosis not present

## 2021-03-05 DIAGNOSIS — K219 Gastro-esophageal reflux disease without esophagitis: Secondary | ICD-10-CM | POA: Diagnosis not present

## 2021-03-05 DIAGNOSIS — C252 Malignant neoplasm of tail of pancreas: Secondary | ICD-10-CM | POA: Diagnosis not present

## 2021-03-05 NOTE — Telephone Encounter (Signed)
Left message that abdomen is getting uncomfortable again.Has appointment with Dr. Carlean Purl on 03/06/21 at 1150> Can he have paracentesis prior to this appointment. Also asking if OK to take Imodium for diarrhea. Called daughter back and informed her that Dr. Benay Spice thinks he needs to hold off on paracentesis now, to allow Dr. Carlean Purl better appreciation of his status. OK to take Imodium up to 8/day for diarrhea.

## 2021-03-06 ENCOUNTER — Ambulatory Visit (INDEPENDENT_AMBULATORY_CARE_PROVIDER_SITE_OTHER): Payer: Medicare Other | Admitting: Internal Medicine

## 2021-03-06 ENCOUNTER — Other Ambulatory Visit: Payer: Self-pay

## 2021-03-06 ENCOUNTER — Ambulatory Visit (HOSPITAL_COMMUNITY)
Admission: RE | Admit: 2021-03-06 | Discharge: 2021-03-06 | Disposition: A | Discharge status: Home / Self Care | Payer: Medicare Other | Source: Ambulatory Visit | Attending: Internal Medicine | Admitting: Internal Medicine

## 2021-03-06 ENCOUNTER — Encounter: Payer: Self-pay | Admitting: Internal Medicine

## 2021-03-06 VITALS — BP 90/50 | HR 72

## 2021-03-06 DIAGNOSIS — L299 Pruritus, unspecified: Secondary | ICD-10-CM | POA: Diagnosis not present

## 2021-03-06 DIAGNOSIS — R188 Other ascites: Secondary | ICD-10-CM | POA: Insufficient documentation

## 2021-03-06 DIAGNOSIS — R180 Malignant ascites: Secondary | ICD-10-CM | POA: Diagnosis not present

## 2021-03-06 DIAGNOSIS — C259 Malignant neoplasm of pancreas, unspecified: Secondary | ICD-10-CM

## 2021-03-06 DIAGNOSIS — C787 Secondary malignant neoplasm of liver and intrahepatic bile duct: Secondary | ICD-10-CM

## 2021-03-06 DIAGNOSIS — R031 Nonspecific low blood-pressure reading: Secondary | ICD-10-CM

## 2021-03-06 HISTORY — PX: IR PARACENTESIS: IMG2679

## 2021-03-06 MED ORDER — LIDOCAINE HCL 1 % IJ SOLN
INTRAMUSCULAR | Status: AC
  Filled 2021-03-06: qty 20

## 2021-03-06 MED ORDER — LIDOCAINE HCL 1 % IJ SOLN
INTRAMUSCULAR | Status: DC | PRN
  Administered 2021-03-06: 10 mL

## 2021-03-06 NOTE — Procedures (Signed)
PROCEDURE SUMMARY:  Successful image-guided paracentesis from the left lateral abdomen.  Yielded 5.0 liters of clear yellow fluid.  No immediate complications.  EBL = 0 mL. Patient tolerated well.   Specimen was not sent for labs.  Please see imaging section of Epic for full dictation.   Claris Pong Batya Citron PA-C 03/06/2021 2:15 PM

## 2021-03-06 NOTE — Patient Instructions (Addendum)
I think you should consider having a permanent catheter to drain the fluid in the abdomen.  Interventional radiology can explain this and you can decide.   I think it may be time to stop taking your enalapril but let me ask Dr. Mickle Plumb.  You have been scheduled for an abdominal paracentesis at [facility] radiology  today at 2:00pm. Please arrive at least 15 minutes prior to your appointment time for registration. Should you need to reschedule this appointment for any reason, please call our office at [phone].   I appreciate the opportunity to care for you. Silvano Rusk, MD, [facility]

## 2021-03-06 NOTE — Progress Notes (Signed)
Gary [name] 87 y.o. 06-Jan-1932 [id number]  Assessment & Plan:   Encounter Diagnoses  Name Primary?  . Malignant ascites Yes  . Pruritus   . Pancreatic cancer metastasized to liver ([place])   . Blood pressure decreased    Malignant ascites is very difficult to control with diuretics.  He has possibly been intolerant to spironolactone.  I think at this point he can either stay with repeated paracenteses versus having a Pleurx abdominal catheter placed by interventional radiology.  I explained this to the patient and his family and did the best I could with risks benefits and indications.  Plan for a consultation with interventional radiology to discuss this and let the patient and family decide if this is something desired.  It would the number of trips required as he is moving towards close to weekly paracenteses it seems.  We did manage to schedule a paracentesis up to 5 L today.  I appreciate the assistance of interventional radiology.  Regarding his reduced blood pressure, I think he can probably come off his enalapril or have that reduced though I do not want to make that decision.  I do have concerns about kidney injury if he does become volume depleted.  I would stop that over metoprolol most likely.  I will message Gary [name] about this.  I appreciate the opportunity to care for this patient. CC: Gary Neer, MD Gary [name] Gary [name]  Subjective:   Chief Complaint: Ascites  HPI The patient is an 87 year old [name] here with his family, suffering with recurrent malignant ascites in the setting of adenocarcinoma the tail the pancreas and hepatic metastases.  He has been having paracenteses about every 10 days the last which was on March 11 and 4 L were drained.  Since October he has had 13 paracenteses.  Gary [name] has tried spironolactone but the patient thought that made him somnolent and he did not take it again.  Kidney function is normal.   Another problem is severe pruritus though he is not jaundiced.  Dermatology is trying to help with that.   He is not taking any narcotics for pain.  The ascites builds and causes gastric compression and impairs eating and also causes respiratory difficulty.  Blood pressures range in the 790 systolic maximum at home I am told.    Allergies  Allergen Reactions  . Gentamycin [Gentamicin Sulfate]     "Inner ears problems after Gent infusion" , also "unsteady mobility"  . Hydrocodone   . Aspirin Other (See Comments)    High doses cause stomach upset   Current Meds  Medication Sig  . Dermatological Products, Misc. [facility]) lotion as needed.  . enalapril (VASOTEC) 20 MG tablet Take 1 tablet (20 mg total) by mouth daily.  . EUCRISA 2 % OINT apply all over body QD for maintenance  . Fluocinolone Acetonide Body 0.01 % OIL   . hydrOXYzine (ATARAX/VISTARIL) 10 MG tablet Take 1 tablet (10 mg total) by mouth 3 (three) times daily as needed.  Marland Kitchen levocetirizine (XYZAL) 5 MG tablet Take 5 mg by mouth at bedtime.  . lidocaine-prilocaine (EMLA) cream Apply 1 application topically as directed. Apply 1 hour prior to IV stick and cover with plastic wrap  . metoprolol succinate (TOPROL-XL) 50 MG 24 hr tablet Take 1 tablet (50 mg total) by mouth 2 (two) times daily. Take with or immediately following a meal.  . mirtazapine (REMERON) 15 MG tablet Take 0.5 tablets (7.5 mg total) by  mouth at bedtime.  . mupirocin ointment (BACTROBAN) 2 % Apply 1 application topically 3 (three) times daily.  Marland Kitchen omeprazole (PRILOSEC) 20 MG capsule Take 1-2 capsules by mouth daily.  Marland Kitchen ZYRTEC ALLERGY 10 MG tablet Take 1 tablet by mouth daily.   Past Medical History:  Diagnosis Date  . Ascending aortic aneurysm ([place]) 01/13/2014   stable at 4.7 cm diameter since 2009  . Asystole x2, s/p pacemaker 01/13/2014  . Bradycardia    permanent pacemaker 11/22/08 St.Jude  . Family history of adverse reaction to anesthesia    brother died  after anesthesia / nose polyp removal at [facility] (40 years ago)  . HTN (hypertension) 01/13/2014  . Hyperlipidemia   . Hypertension   . Malignant ascites   . Pacemaker - leads 1987, last generator 2009 St. Jude dual-chamber 01/13/2014   Atrial lead model 433-01, ventricular lead model 431-01 implanted November 1987 Right ventricular lead dysfunction with low impedance and mediocre sensing but satisfactory pacing threshold  . Pacemaker lead malfunction 01/13/2014   Chronic low impedance and poor sensing of the ventricular lead  . Pancreatic cancer [facility])    Metastatic to liver   Past Surgical History:  Procedure Laterality Date  . IR IMAGING GUIDED PORT INSERTION  05/24/2020  . IR PARACENTESIS  10/26/2020  . IR PARACENTESIS  12/12/2020  . IR PARACENTESIS  01/22/2021  . IR PARACENTESIS  02/02/2021  . IR PARACENTESIS  02/14/2021  . IR PARACENTESIS  02/23/2021  . PERMANENT PACEMAKER GENERATOR CHANGE  11/22/08   ST. Jude  . PERMANENT PACEMAKER INSERTION  03/04/97   ST. Jude  . US ECHOCARDIOGRAPHY  05/16/11   AS mild to mod.,TR mild to mod., EF => 55%.   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   Review of Systems Weakness and fatigue.  Using a wheelchair.  Otherwise as per HPI or negative  Objective:   Physical Exam  BP (!) 90/50 (BP Location: Left Arm, Patient Position: Sitting, Cuff Size: Normal)   Pulse 72  Chronically ill-appearing Gary [name] in wheelchair no acute distress Eyes are anicteric The abdomen is significantly distended with ascites and somewhat tense though he is sitting.  No hernia.  He is not tender. He appears alert and oriented x3 The skin has some excoriations His mood and affect are appropriate  Data reviewed include oncology notes interventional  radiology notes labs in the EMR imaging including personal review of most recent CT scan

## 2021-03-07 ENCOUNTER — Telehealth: Payer: Self-pay | Admitting: Nurse Practitioner

## 2021-03-07 DIAGNOSIS — I10 Essential (primary) hypertension: Secondary | ICD-10-CM | POA: Diagnosis not present

## 2021-03-07 DIAGNOSIS — Z9181 History of falling: Secondary | ICD-10-CM | POA: Diagnosis not present

## 2021-03-07 DIAGNOSIS — R600 Localized edema: Secondary | ICD-10-CM | POA: Diagnosis not present

## 2021-03-07 DIAGNOSIS — E785 Hyperlipidemia, unspecified: Secondary | ICD-10-CM | POA: Diagnosis not present

## 2021-03-07 DIAGNOSIS — K5730 Diverticulosis of large intestine without perforation or abscess without bleeding: Secondary | ICD-10-CM | POA: Diagnosis not present

## 2021-03-07 DIAGNOSIS — C787 Secondary malignant neoplasm of liver and intrahepatic bile duct: Secondary | ICD-10-CM | POA: Diagnosis not present

## 2021-03-07 DIAGNOSIS — R001 Bradycardia, unspecified: Secondary | ICD-10-CM | POA: Diagnosis not present

## 2021-03-07 DIAGNOSIS — K449 Diaphragmatic hernia without obstruction or gangrene: Secondary | ICD-10-CM | POA: Diagnosis not present

## 2021-03-07 DIAGNOSIS — I712 Thoracic aortic aneurysm, without rupture: Secondary | ICD-10-CM | POA: Diagnosis not present

## 2021-03-07 DIAGNOSIS — K219 Gastro-esophageal reflux disease without esophagitis: Secondary | ICD-10-CM | POA: Diagnosis not present

## 2021-03-07 DIAGNOSIS — L97529 Non-pressure chronic ulcer of other part of left foot with unspecified severity: Secondary | ICD-10-CM | POA: Diagnosis not present

## 2021-03-07 DIAGNOSIS — Z950 Presence of cardiac pacemaker: Secondary | ICD-10-CM | POA: Diagnosis not present

## 2021-03-07 DIAGNOSIS — K8080 Other cholelithiasis without obstruction: Secondary | ICD-10-CM | POA: Diagnosis not present

## 2021-03-07 DIAGNOSIS — C252 Malignant neoplasm of tail of pancreas: Secondary | ICD-10-CM | POA: Diagnosis not present

## 2021-03-07 DIAGNOSIS — I700 Atherosclerosis of aorta: Secondary | ICD-10-CM | POA: Diagnosis not present

## 2021-03-07 MED ORDER — METOPROLOL SUCCINATE ER 50 MG PO TB24: 25.0000 mg | ORAL_TABLET | Freq: Every day | ORAL | 2 refills | Status: DC

## 2021-03-07 NOTE — Telephone Encounter (Signed)
   Pts son called this afternoon to report that pts BP has been trending in the 80's to 90's.  Pt has lost a significant amt of wt in the setting of pancreatic cancer and has poor PO intake.  He has general fatigue, which has been present x several mos, but no new Ss.  He was seen by GI yesterday and advised to stop enalapril. He did not take enalapril this AM but did take toprol xl 50 mg (which he usually takes BID).  SBP 89 mmHg currently.  I advised that he not take PM dose of Toprol and further that he should reduce Toprol to 25mg  daily starting tomorrow w/ the caveat that the dose should be held for SBP < 151mmHg.  Caller verbalized understanding and was grateful for the call back.  Murray Hodgkins, NP 03/07/2021, 5:25 PM

## 2021-03-07 NOTE — Telephone Encounter (Signed)
Thanks, Gerald Stabs. I agree.

## 2021-03-08 DIAGNOSIS — K219 Gastro-esophageal reflux disease without esophagitis: Secondary | ICD-10-CM | POA: Diagnosis not present

## 2021-03-08 DIAGNOSIS — E785 Hyperlipidemia, unspecified: Secondary | ICD-10-CM | POA: Diagnosis not present

## 2021-03-08 DIAGNOSIS — L97529 Non-pressure chronic ulcer of other part of left foot with unspecified severity: Secondary | ICD-10-CM | POA: Diagnosis not present

## 2021-03-08 DIAGNOSIS — C787 Secondary malignant neoplasm of liver and intrahepatic bile duct: Secondary | ICD-10-CM | POA: Diagnosis not present

## 2021-03-08 DIAGNOSIS — I10 Essential (primary) hypertension: Secondary | ICD-10-CM | POA: Diagnosis not present

## 2021-03-08 DIAGNOSIS — C252 Malignant neoplasm of tail of pancreas: Secondary | ICD-10-CM | POA: Diagnosis not present

## 2021-03-12 ENCOUNTER — Telehealth: Payer: Self-pay | Admitting: *Deleted

## 2021-03-12 NOTE — Telephone Encounter (Addendum)
Call from [facility] that patient is not able to tolerate any antihistamine and topicals are not helping his itching. Asking if Dr. Benay Spice is OK with trying low dose prednisone or Dupixent injections? Per Dr. Benay Spice: He will review information on Dupixent and make decision.

## 2021-03-13 ENCOUNTER — Other Ambulatory Visit: Payer: Self-pay

## 2021-03-13 ENCOUNTER — Telehealth: Payer: Self-pay

## 2021-03-13 DIAGNOSIS — C252 Malignant neoplasm of tail of pancreas: Secondary | ICD-10-CM

## 2021-03-13 NOTE — Progress Notes (Unsigned)
Called Central Scheduling to schedule paracentesis

## 2021-03-13 NOTE — Progress Notes (Signed)
Received call from patient's daughter Aleck Locklin requesting he have paracentesis this Thursday after 1:30 at [facility].  She would then like him scheduled every Tuesday after that after 1:30.

## 2021-03-13 NOTE — Telephone Encounter (Signed)
Scheduled patient per daughter's request for IR paracentesis at [facility]. Tomorrow 3/30 at 2:00 to arrive at 1:45 this was the only afternoon appointment available.  I have left her a voice message.

## 2021-03-14 ENCOUNTER — Ambulatory Visit (HOSPITAL_COMMUNITY)
Admission: RE | Admit: 2021-03-14 | Discharge: 2021-03-14 | Disposition: A | Discharge status: Home / Self Care | Payer: Medicare Other | Source: Ambulatory Visit | Attending: Oncology | Admitting: Oncology

## 2021-03-14 ENCOUNTER — Other Ambulatory Visit: Payer: Self-pay

## 2021-03-14 DIAGNOSIS — C252 Malignant neoplasm of tail of pancreas: Secondary | ICD-10-CM | POA: Diagnosis not present

## 2021-03-14 DIAGNOSIS — R188 Other ascites: Secondary | ICD-10-CM | POA: Diagnosis not present

## 2021-03-14 DIAGNOSIS — R180 Malignant ascites: Secondary | ICD-10-CM | POA: Diagnosis not present

## 2021-03-14 HISTORY — PX: IR PARACENTESIS: IMG2679

## 2021-03-14 MED ORDER — LIDOCAINE HCL 1 % IJ SOLN
INTRAMUSCULAR | Status: AC
  Filled 2021-03-14: qty 20

## 2021-03-14 NOTE — Procedures (Signed)
PROCEDURE SUMMARY:  Successful image-guided paracentesis from the right lateral abdomen.  Yielded 4.0 liters of clear yellow fluid.  No immediate complications.  EBL < 1 mL. Patient tolerated well.   Specimen was not sent for labs.  Please see imaging section of Epic for full dictation.   Claris Pong Coda Mathey PA-C 03/14/2021 2:30 PM

## 2021-03-15 ENCOUNTER — Other Ambulatory Visit: Payer: Self-pay | Admitting: Nurse Practitioner

## 2021-03-16 DIAGNOSIS — C787 Secondary malignant neoplasm of liver and intrahepatic bile duct: Secondary | ICD-10-CM | POA: Diagnosis not present

## 2021-03-16 DIAGNOSIS — E785 Hyperlipidemia, unspecified: Secondary | ICD-10-CM | POA: Diagnosis not present

## 2021-03-16 DIAGNOSIS — C252 Malignant neoplasm of tail of pancreas: Secondary | ICD-10-CM | POA: Diagnosis not present

## 2021-03-16 DIAGNOSIS — L97529 Non-pressure chronic ulcer of other part of left foot with unspecified severity: Secondary | ICD-10-CM | POA: Diagnosis not present

## 2021-03-16 DIAGNOSIS — K219 Gastro-esophageal reflux disease without esophagitis: Secondary | ICD-10-CM | POA: Diagnosis not present

## 2021-03-16 DIAGNOSIS — I10 Essential (primary) hypertension: Secondary | ICD-10-CM | POA: Diagnosis not present

## 2021-03-19 DIAGNOSIS — I10 Essential (primary) hypertension: Secondary | ICD-10-CM | POA: Diagnosis not present

## 2021-03-19 DIAGNOSIS — L97529 Non-pressure chronic ulcer of other part of left foot with unspecified severity: Secondary | ICD-10-CM | POA: Diagnosis not present

## 2021-03-19 DIAGNOSIS — E785 Hyperlipidemia, unspecified: Secondary | ICD-10-CM | POA: Diagnosis not present

## 2021-03-19 DIAGNOSIS — C787 Secondary malignant neoplasm of liver and intrahepatic bile duct: Secondary | ICD-10-CM | POA: Diagnosis not present

## 2021-03-19 DIAGNOSIS — C252 Malignant neoplasm of tail of pancreas: Secondary | ICD-10-CM | POA: Diagnosis not present

## 2021-03-19 DIAGNOSIS — K219 Gastro-esophageal reflux disease without esophagitis: Secondary | ICD-10-CM | POA: Diagnosis not present

## 2021-03-21 ENCOUNTER — Other Ambulatory Visit: Payer: Self-pay | Admitting: Oncology

## 2021-03-21 ENCOUNTER — Other Ambulatory Visit (HOSPITAL_COMMUNITY): Payer: Self-pay | Admitting: Oncology

## 2021-03-21 ENCOUNTER — Other Ambulatory Visit: Payer: Self-pay | Admitting: Nurse Practitioner

## 2021-03-21 ENCOUNTER — Telehealth: Payer: Self-pay

## 2021-03-21 ENCOUNTER — Other Ambulatory Visit: Payer: Self-pay

## 2021-03-21 DIAGNOSIS — C259 Malignant neoplasm of pancreas, unspecified: Secondary | ICD-10-CM

## 2021-03-21 DIAGNOSIS — C252 Malignant neoplasm of tail of pancreas: Secondary | ICD-10-CM

## 2021-03-21 NOTE — Telephone Encounter (Signed)
TC from above Pt's daughter stating that she would like to make an appointment for her father Gary [name] for paracentesis. Scheduling called paracentesis scheduled at [facility] for 03/22/21 at 945.

## 2021-03-22 ENCOUNTER — Ambulatory Visit (HOSPITAL_COMMUNITY): Payer: Medicare Other

## 2021-03-22 ENCOUNTER — Other Ambulatory Visit (HOSPITAL_COMMUNITY): Payer: Medicare Other

## 2021-03-22 ENCOUNTER — Other Ambulatory Visit: Payer: Self-pay

## 2021-03-22 ENCOUNTER — Ambulatory Visit (HOSPITAL_COMMUNITY)
Admission: RE | Admit: 2021-03-22 | Discharge: 2021-03-22 | Disposition: A | Discharge status: Home / Self Care | Payer: Medicare Other | Source: Ambulatory Visit | Attending: Nurse Practitioner | Admitting: Nurse Practitioner

## 2021-03-22 DIAGNOSIS — R188 Other ascites: Secondary | ICD-10-CM | POA: Insufficient documentation

## 2021-03-22 DIAGNOSIS — C252 Malignant neoplasm of tail of pancreas: Secondary | ICD-10-CM | POA: Diagnosis not present

## 2021-03-22 HISTORY — PX: IR PARACENTESIS: IMG2679

## 2021-03-22 MED ORDER — LIDOCAINE HCL 1 % IJ SOLN
INTRAMUSCULAR | Status: DC | PRN
  Administered 2021-03-22: 10 mL

## 2021-03-22 MED ORDER — LIDOCAINE HCL 1 % IJ SOLN
INTRAMUSCULAR | Status: AC
  Filled 2021-03-22: qty 20

## 2021-03-22 NOTE — Procedures (Signed)
Ultrasound-guided diagnostic and therapeutic paracentesis performed yielding 4 liters (maximum ordered) of yellow fluid. No immediate complications. A portion of the fluid was sent to the lab for cytology. EBL none.

## 2021-03-23 ENCOUNTER — Inpatient Hospital Stay: Payer: Medicare Other

## 2021-03-23 ENCOUNTER — Other Ambulatory Visit: Payer: Self-pay

## 2021-03-23 ENCOUNTER — Encounter: Payer: Self-pay | Admitting: Nurse Practitioner

## 2021-03-23 ENCOUNTER — Inpatient Hospital Stay: Payer: Medicare Other | Attending: Oncology | Admitting: Nurse Practitioner

## 2021-03-23 VITALS — BP 108/73 | HR 86 | Temp 97.8°F | Resp 20 | Ht 66.0 in | Wt 145.4 lb

## 2021-03-23 DIAGNOSIS — Z8049 Family history of malignant neoplasm of other genital organs: Secondary | ICD-10-CM | POA: Diagnosis not present

## 2021-03-23 DIAGNOSIS — E785 Hyperlipidemia, unspecified: Secondary | ICD-10-CM | POA: Insufficient documentation

## 2021-03-23 DIAGNOSIS — Z79899 Other long term (current) drug therapy: Secondary | ICD-10-CM | POA: Diagnosis not present

## 2021-03-23 DIAGNOSIS — I10 Essential (primary) hypertension: Secondary | ICD-10-CM | POA: Insufficient documentation

## 2021-03-23 DIAGNOSIS — I712 Thoracic aortic aneurysm, without rupture: Secondary | ICD-10-CM | POA: Diagnosis not present

## 2021-03-23 DIAGNOSIS — R600 Localized edema: Secondary | ICD-10-CM | POA: Insufficient documentation

## 2021-03-23 DIAGNOSIS — Z95828 Presence of other vascular implants and grafts: Secondary | ICD-10-CM

## 2021-03-23 DIAGNOSIS — K219 Gastro-esophageal reflux disease without esophagitis: Secondary | ICD-10-CM | POA: Diagnosis not present

## 2021-03-23 DIAGNOSIS — L299 Pruritus, unspecified: Secondary | ICD-10-CM | POA: Insufficient documentation

## 2021-03-23 DIAGNOSIS — C252 Malignant neoplasm of tail of pancreas: Secondary | ICD-10-CM

## 2021-03-23 DIAGNOSIS — R109 Unspecified abdominal pain: Secondary | ICD-10-CM | POA: Insufficient documentation

## 2021-03-23 LAB — CBC WITH DIFFERENTIAL (CANCER CENTER ONLY)
Abs Immature Granulocytes: 0.03 10*3/uL (ref 0.00–0.07)
Basophils Absolute: 0 10*3/uL (ref 0.0–0.1)
Basophils Relative: 1 %
Eosinophils Absolute: 0.1 10*3/uL (ref 0.0–0.5)
Eosinophils Relative: 1 %
HCT: 36.1 % — ABNORMAL LOW (ref 39.0–52.0)
Hemoglobin: 11.9 g/dL — ABNORMAL LOW (ref 13.0–17.0)
Immature Granulocytes: 1 %
Lymphocytes Relative: 13 %
Lymphs Abs: 0.9 10*3/uL (ref 0.7–4.0)
MCH: 29.5 pg (ref 26.0–34.0)
MCHC: 33 g/dL (ref 30.0–36.0)
MCV: 89.4 fL (ref 80.0–100.0)
Monocytes Absolute: 0.6 10*3/uL (ref 0.1–1.0)
Monocytes Relative: 9 %
Neutro Abs: 5 10*3/uL (ref 1.7–7.7)
Neutrophils Relative %: 75 %
Platelet Count: 184 10*3/uL (ref 150–400)
RBC: 4.04 MIL/uL — ABNORMAL LOW (ref 4.22–5.81)
RDW: 16.8 % — ABNORMAL HIGH (ref 11.5–15.5)
WBC Count: 6.6 10*3/uL (ref 4.0–10.5)
nRBC: 0 % (ref 0.0–0.2)

## 2021-03-23 LAB — CMP (CANCER CENTER ONLY)
ALT: 12 U/L (ref 0–44)
AST: 20 U/L (ref 15–41)
Albumin: 2.7 g/dL — ABNORMAL LOW (ref 3.5–5.0)
Alkaline Phosphatase: 113 U/L (ref 38–126)
Anion gap: 8 (ref 5–15)
BUN: 32 mg/dL — ABNORMAL HIGH (ref 8–23)
CO2: 22 mmol/L (ref 22–32)
Calcium: 8.4 mg/dL — ABNORMAL LOW (ref 8.9–10.3)
Chloride: 104 mmol/L (ref 98–111)
Creatinine: 1.13 mg/dL (ref 0.61–1.24)
GFR, Estimated: >60 mL/min (ref >60)
Glucose, Bld: 137 mg/dL — ABNORMAL HIGH (ref 70–99)
Potassium: 4 mmol/L (ref 3.5–5.1)
Sodium: 134 mmol/L — ABNORMAL LOW (ref 135–145)
Total Bilirubin: 0.6 mg/dL (ref 0.3–1.2)
Total Protein: 6.1 g/dL — ABNORMAL LOW (ref 6.5–8.1)

## 2021-03-23 LAB — CYTOLOGY - NON PAP

## 2021-03-23 MED ORDER — SODIUM CHLORIDE 0.9% FLUSH
10.0000 mL | Freq: Once | INTRAVENOUS | Status: AC
  Administered 2021-03-23: 10 mL via INTRAVENOUS
  Filled 2021-03-23: qty 10

## 2021-03-23 NOTE — Progress Notes (Addendum)
[place] OFFICE PROGRESS NOTE   Diagnosis: Pancreas cancer  INTERVAL HISTORY:   Gary [name] returns as scheduled.  He completed a cycle of gemcitabine/Abraxane 01/31/2021.  Treatment was held 02/28/2021 due to poor performance status.  He is seen today for reevaluation.  He continues to have intermittent generalized pruritus.  He has seen dermatology.  A trial of Dupixent is planned.  He had a paracentesis yesterday with 4 L of fluid removed.  Cytology again negative for malignant cells.  He tends to have abdominal pain when abdomen becomes distended.  The pain is relieved after a paracentesis.  He remains fatigued and feels weak.  Appetite varies.  Right upper ear is sore when he lays on it.  Objective:  Vital signs in last 24 hours:  Blood pressure 108/73, pulse 86, temperature 97.8 F (36.6 C), temperature source Tympanic, resp. rate 20, height 5\' 6"  (1.676 m), weight 145 lb 6.4 oz (66 kg), SpO2 98 %.    HEENT: No thrush or ulcers. Resp: Lungs clear bilaterally. Cardio: Regular rate and rhythm. GI: Abdomen distended consistent with ascites. Vascular: Trace bilateral lower leg edema. Skin: No rash.  Small slightly raised lesion right ear medial helix with a tiny scab, no surrounding erythema. Port-A-Cath without erythema.   Lab Results:  Lab Results  Component Value Date   WBC 6.6 03/23/2021   HGB 11.9 (L) 03/23/2021   HCT 36.1 (L) 03/23/2021   MCV 89.4 03/23/2021   PLT 184 03/23/2021   NEUTROABS 5.0 03/23/2021    Imaging:  IR Paracentesis  Result Date: 03/22/2021 INDICATION: Patient with history of metastatic pancreatic cancer, recurrent ascites. Request received for diagnostic and therapeutic paracentesis up to 4 liters. EXAM: ULTRASOUND GUIDED DIAGNOSTIC AND THERAPEUTIC PARACENTESIS MEDICATIONS: 1% lidocaine to skin and subcutaneous tissue COMPLICATIONS: None immediate. PROCEDURE: Informed written consent was obtained from the patient after a  discussion of the risks, benefits and alternatives to treatment. A timeout was performed prior to the initiation of the procedure. Initial ultrasound scanning demonstrates a large amount of ascites within the left lower abdominal quadrant. The left lower abdomen was prepped and draped in the usual sterile fashion. 1% lidocaine was used for local anesthesia. Following this, a 19 gauge, 7-cm, Yueh catheter was introduced. An ultrasound image was saved for documentation purposes. The paracentesis was performed. The catheter was removed and a dressing was applied. The patient tolerated the procedure well without immediate post procedural complication. FINDINGS: A total of approximately 4 liters of yellow fluid was removed. Samples were sent to the laboratory as requested by the clinical team. IMPRESSION: Successful ultrasound-guided diagnostic and therapeutic paracentesis yielding 4 liters of peritoneal fluid. Read by: Rowe Robert, PA-C Electronically Signed   By: Sandi Mariscal M.D.   On: 03/22/2021 12:07    Medications: I have reviewed the patient's current medications.  Assessment/Plan: 1. Pancreas tail lesion, multiple liver lesions   Chest CT 04/12/2020-ascending thoracic aortic aneurysm; hypovascular lesion tail of the pancreas, new compared to the prior examination. Multiple poorly defined hypovascular lesions scattered throughout the liver   CT abdomen/pelvis 05/02/2020-hypoenhancing lesion of the pancreatic tail measuring approximately 2.4 x 1.9 cm; enlarged portacaval lymph nodes measuring up to 1.9 x 1.4 cm; numerous hypodense lesions throughout the liver, index lesion of the liver dome measuring 1.7 x 1.2 cm.  Ultrasound-guided biopsy of a left liver lesion 05/10/2020-poorly differentiated carcinoma consistent with a pancreatobiliary primary  Markedly elevated CA 19-9  Cycle 1day 1gemcitabine/Abraxane 05/26/2020  Cycle1 day 15gemcitabine/Abraxane 06/12/2020  Cycle2 day  1gemcitabine/Abraxane 07/04/2020(chemotherapy doses reduced, antiemetic regimen adjusted)  Cycle2 day 15Gemcitabine/Abraxane 07/18/2020(continue same dose reduction, reduce post-treatment steroids due to insomnia)  Cycle 3 day 1 gem/abraxane 08/02/20  Cycle 3 day 15 gem/Abraxane 08/15/2020  CTs abdomen/pelvis 08/25/2020-pancreas tail mass and portacaval lymphadenopathy decreased. Liver metastases reported as increased, further review with no significant change in the majority of liver lesions and a decrease in the size of a few lesions, no new margin lesions  Cycle 4-day 1 gemcitabine/Abraxane 09/05/2020  Paracentesis 09/26/2020-reactive mesothelial cells  Paracentesis 10/09/2020-reactive mesothelial cells  Cycle 5 gemcitabine/Abraxane 10/12/2020  Cycle 6 gemcitabine/Abraxane 11/01/2020  Cycle 7 gemcitabine/Abraxane 11/22/2020  Cycle 8 gemcitabine/Abraxane 12/13/2020  Cycle 9 gemcitabine/Abraxane 01/03/2021  CT abdomen/pelvis 01/29/2021-decrease in pancreas tail mass, decrease in size of multiple hepatic metastases, decreased size of hepatoduodenal ligament lymph nodes, progressive ascites  Cycle 10 gemcitabine/Abraxane 01/31/2021  2. Thoracic aortic aneurysm 3. Hypertension 4. Hyperlipidemia 5. History of bradycardia status post permanent pacemaker 6. Mother hadcervicalcancer 7. Pitting lower leg edema 8. GERD, on omeprazole 9. Right upper back inflamed sebaceous cyst-culture from cyst drainage 10/23/2020-2 staph species, antibiotic changed to ciprofloxacin 10/27/2020, status post I&D procedure 11/17/2020 10. Refractory ascites-trial of spironolactone 03/23/2021   Disposition: Gary [name] appears unchanged. He last had chemotherapy 01/31/2021.  Treatment has been on hold due to patient and family request/poor performance status.  His clinical status/performance status has not significantly changed despite being off of treatment.  The CA 19-9 tumor marker from 02/28/2021 was  further improved.  We discussed with Gary [name] that the tumor marker may no longer be a reliable indicator of disease status.  Gary [name] does not want to continue chemotherapy at this time.  He agrees to a restaging CT scan in approximately 3 weeks.    He continues to require periodic paracentesis procedures.  Etiology of the ascites is unclear.  Multiple cytology results have been negative for malignancy including 03/22/2021.  He agrees to trying spironolactone again.    We will see him in follow-up on 04/19/2021.  We are available to see him sooner if needed.  Patient seen with Dr. Luberta Robertson ANP/GNP-BC   03/23/2021  12:15 PM  This was a shared visit with Ned Card.  Gary [name] was interviewed and examined.  He appears unchanged.  He continues to have refractory ascites requiring repeat paracentesis procedures.  He has a decreased activity level, though he has not changed significantly over many months.  The etiology of the ascites remains unclear.  Repeat cytologies have been negative.  I suspect he would be declining more rapidly if he had malignant ascites.  We decided to follow him off of chemotherapy since he is not clearly benefiting and develops increased malaise following chemotherapy.  We recommended he resume spironolactone.  I was present for greater than 50% of today's visit.  I perform medical decision making.  Julieanne Manson, MD

## 2021-03-24 LAB — CANCER ANTIGEN 19-9: CA 19-9: 397 U/mL — ABNORMAL HIGH (ref 0–35)

## 2021-03-26 ENCOUNTER — Ambulatory Visit (HOSPITAL_COMMUNITY): Payer: Medicare Other

## 2021-03-26 ENCOUNTER — Other Ambulatory Visit: Payer: Self-pay | Admitting: *Deleted

## 2021-03-26 ENCOUNTER — Other Ambulatory Visit (HOSPITAL_COMMUNITY): Payer: Medicare Other

## 2021-03-26 DIAGNOSIS — C252 Malignant neoplasm of tail of pancreas: Secondary | ICD-10-CM

## 2021-03-26 NOTE — Progress Notes (Signed)
Per Dr. Benay Spice: Needs total protein, albumin, cell count w/diff, and amylase tested on fluids from next paracentesis. Order placed for when daughter calls to request next paracentesis.

## 2021-03-29 ENCOUNTER — Other Ambulatory Visit: Payer: Self-pay

## 2021-03-29 ENCOUNTER — Ambulatory Visit (HOSPITAL_COMMUNITY): Payer: Medicare Other

## 2021-03-29 ENCOUNTER — Other Ambulatory Visit: Payer: Self-pay | Admitting: *Deleted

## 2021-03-29 ENCOUNTER — Ambulatory Visit (HOSPITAL_COMMUNITY)
Admission: RE | Admit: 2021-03-29 | Discharge: 2021-03-29 | Disposition: A | Discharge status: Home / Self Care | Payer: Medicare Other | Source: Ambulatory Visit | Attending: Oncology | Admitting: Oncology

## 2021-03-29 ENCOUNTER — Telehealth: Payer: Self-pay | Admitting: *Deleted

## 2021-03-29 DIAGNOSIS — C252 Malignant neoplasm of tail of pancreas: Secondary | ICD-10-CM | POA: Insufficient documentation

## 2021-03-29 DIAGNOSIS — R188 Other ascites: Secondary | ICD-10-CM | POA: Diagnosis not present

## 2021-03-29 HISTORY — PX: IR PARACENTESIS: IMG2679

## 2021-03-29 LAB — ALBUMIN, PLEURAL OR PERITONEAL FLUID: Albumin, Fluid: <1 g/dL

## 2021-03-29 LAB — BODY FLUID CELL COUNT WITH DIFFERENTIAL
Eos, Fluid: 0 %
Lymphs, Fluid: 39 %
Monocyte-Macrophage-Serous Fluid: 57 % (ref 50–90)
Neutrophil Count, Fluid: 4 % (ref 0–25)
Total Nucleated Cell Count, Fluid: 64 cu mm (ref 0–1000)

## 2021-03-29 LAB — AMYLASE, PLEURAL OR PERITONEAL FLUID: Amylase, Fluid: 25 U/L

## 2021-03-29 LAB — PROTEIN, PLEURAL OR PERITONEAL FLUID: Total protein, fluid: <3 g/dL

## 2021-03-29 MED ORDER — LIDOCAINE HCL (PF) 1 % IJ SOLN
INTRAMUSCULAR | Status: DC | PRN
  Administered 2021-03-29: 30 mL

## 2021-03-29 MED ORDER — LIDOCAINE HCL 1 % IJ SOLN
INTRAMUSCULAR | Status: AC
  Filled 2021-03-29: qty 20

## 2021-03-29 NOTE — Procedures (Signed)
Ultrasound-guided diagnostic and therapeutic paracentesis performed yielding 4 liters (maximum ordered) of yellow fluid. No immediate complications.EBL none. A portion of the fluid was sent to the lab for preordered studies.

## 2021-03-29 NOTE — Telephone Encounter (Signed)
Daughter called requesting a paracentesis today due to patient discomfort--does not feel he can wait till Monday. Per central scheduling, there are no openings today or tomorrow. She is also asking for weekly paracentesis on Mondays. MD approved this order and it was placed for weekly x 10. Was informed by Einar Pheasant w/[facility] that family would need to call central scheduling and they can set them up. They were able to add him in today and see order for paracentesis with additional lab studies on the fluid. This will be completed today. Informed daughter that order is in and provided her phone # for central scheduling. She is asking if we are aware of a pill that has been approved in [place] that can cure pancreas cancer. Informed her to obtain the name of the drug and we can look into it for her. She agrees to do so.

## 2021-03-30 LAB — PATHOLOGIST SMEAR REVIEW

## 2021-04-02 ENCOUNTER — Ambulatory Visit (HOSPITAL_COMMUNITY): Payer: Medicare Other

## 2021-04-03 ENCOUNTER — Telehealth: Payer: Self-pay | Admitting: Internal Medicine

## 2021-04-03 MED ORDER — FUROSEMIDE 20 MG PO TABS: 20.0000 mg | ORAL_TABLET | Freq: Every day | ORAL | 1 refills | Status: DC

## 2021-04-03 NOTE — Telephone Encounter (Signed)
I spoke to patient's son Barnabas Lister.  Explained that the fluid analysis on the ascites suggest that is not malignant ascites.  Thus we need to continue to try diuretics.  The patient initially stopped spironolactone after 1 or 2 doses because he said it made him very sleepy.  I explained that this is an unusual occurrence in my experience in fact I have never had a patient complaint of fatigue or sleepiness from spironolactone though that certainly could be the case for the patient.  He actually has been taking it at bedtime around 8:00.  We do not know how much he been urinating with this and he continues to require large-volume paracentesis weekly.   My plan is as follows:  Take the spironolactone 50 mg in the morning.  If there are issues with somnolence or sleepiness let me know.  I could switch to Eplenerone if so.  Also start furosemide 20 mg in the morning.  He has labs at the end of this month.  I am going to be available by MyChart or text message if needed but [place] 4[place]21 to 4 -29  We can titrate up on the diuretics further pending clinical course and follow-up of renal function and electrolytes.  I am puzzled as to the cause of this though I wonder if this is not related in someway to the liver metastases acting in a similar manner to cirrhosis causing portal hypertension somehow.

## 2021-04-05 DIAGNOSIS — L2084 Intrinsic (allergic) eczema: Secondary | ICD-10-CM | POA: Diagnosis not present

## 2021-04-06 ENCOUNTER — Ambulatory Visit (HOSPITAL_COMMUNITY)
Admission: RE | Admit: 2021-04-06 | Discharge: 2021-04-06 | Disposition: A | Discharge status: Home / Self Care | Payer: Medicare Other | Source: Ambulatory Visit | Attending: Oncology | Admitting: Oncology

## 2021-04-06 ENCOUNTER — Other Ambulatory Visit: Payer: Self-pay

## 2021-04-06 DIAGNOSIS — C252 Malignant neoplasm of tail of pancreas: Secondary | ICD-10-CM | POA: Diagnosis not present

## 2021-04-06 DIAGNOSIS — R188 Other ascites: Secondary | ICD-10-CM | POA: Insufficient documentation

## 2021-04-06 HISTORY — PX: IR PARACENTESIS: IMG2679

## 2021-04-06 MED ORDER — LIDOCAINE HCL 1 % IJ SOLN
INTRAMUSCULAR | Status: AC
  Filled 2021-04-06: qty 20

## 2021-04-06 MED ORDER — LIDOCAINE HCL 1 % IJ SOLN
INTRAMUSCULAR | Status: AC | PRN
  Administered 2021-04-06: 10 mL

## 2021-04-06 NOTE — Procedures (Signed)
PROCEDURE SUMMARY:  Successful US guided paracentesis from right abdomen.  Yielded 4 L of clear yellow fluid.  No immediate complications.  Pt tolerated well.   EBL < 2 mL  Theresa Duty, NP 04/06/2021 2:14 PM

## 2021-04-07 ENCOUNTER — Emergency Department (HOSPITAL_COMMUNITY): Payer: Medicare Other

## 2021-04-07 ENCOUNTER — Encounter (HOSPITAL_COMMUNITY): Payer: Self-pay | Admitting: Emergency Medicine

## 2021-04-07 ENCOUNTER — Emergency Department (HOSPITAL_COMMUNITY)
Admission: EM | Admit: 2021-04-07 | Discharge: 2021-04-07 | Disposition: A | Discharge status: Home / Self Care | Payer: Medicare Other | Attending: Emergency Medicine | Admitting: Emergency Medicine

## 2021-04-07 ENCOUNTER — Other Ambulatory Visit: Payer: Self-pay

## 2021-04-07 DIAGNOSIS — R0902 Hypoxemia: Secondary | ICD-10-CM | POA: Diagnosis not present

## 2021-04-07 DIAGNOSIS — S0101XA Laceration without foreign body of scalp, initial encounter: Secondary | ICD-10-CM | POA: Diagnosis not present

## 2021-04-07 DIAGNOSIS — R55 Syncope and collapse: Secondary | ICD-10-CM

## 2021-04-07 DIAGNOSIS — W1811XA Fall from or off toilet without subsequent striking against object, initial encounter: Secondary | ICD-10-CM | POA: Insufficient documentation

## 2021-04-07 DIAGNOSIS — Z8507 Personal history of malignant neoplasm of pancreas: Secondary | ICD-10-CM | POA: Insufficient documentation

## 2021-04-07 DIAGNOSIS — I10 Essential (primary) hypertension: Secondary | ICD-10-CM | POA: Diagnosis not present

## 2021-04-07 DIAGNOSIS — M47812 Spondylosis without myelopathy or radiculopathy, cervical region: Secondary | ICD-10-CM

## 2021-04-07 DIAGNOSIS — Y92002 Bathroom of unspecified non-institutional (private) residence single-family (private) house as the place of occurrence of the external cause: Secondary | ICD-10-CM | POA: Insufficient documentation

## 2021-04-07 DIAGNOSIS — Z0389 Encounter for observation for other suspected diseases and conditions ruled out: Secondary | ICD-10-CM | POA: Diagnosis not present

## 2021-04-07 DIAGNOSIS — S0990XA Unspecified injury of head, initial encounter: Secondary | ICD-10-CM | POA: Diagnosis not present

## 2021-04-07 DIAGNOSIS — Z9221 Personal history of antineoplastic chemotherapy: Secondary | ICD-10-CM | POA: Insufficient documentation

## 2021-04-07 DIAGNOSIS — Z950 Presence of cardiac pacemaker: Secondary | ICD-10-CM | POA: Insufficient documentation

## 2021-04-07 DIAGNOSIS — I959 Hypotension, unspecified: Secondary | ICD-10-CM | POA: Diagnosis not present

## 2021-04-07 DIAGNOSIS — R197 Diarrhea, unspecified: Secondary | ICD-10-CM | POA: Diagnosis not present

## 2021-04-07 NOTE — ED Notes (Signed)
Dr Eulis Foster at the bedside for wound care- dermabond completed

## 2021-04-07 NOTE — Discharge Instructions (Addendum)
There was no serious problem found after the syncopal episode with head injury.  We have placed tissue adhesive on the wound of your scalp which should hold the wound edges in place, while it heals.  The glue will probably fall off in 5 or 6 days.  Your CAT scans did not show any serious injury.  You do have moderate degenerative arthritis of the cervical spine.  Use Tylenol if needed for pain.  Make sure you are getting plenty of rest, eating and drinking well.

## 2021-04-07 NOTE — ED Notes (Signed)
Patient's family provided with nonstick bandage for home use- for head laceration

## 2021-04-07 NOTE — ED Notes (Signed)
Patient to CT.

## 2021-04-07 NOTE — ED Notes (Signed)
Patient's son is with him at the bedside and awaiting for evaluation

## 2021-04-07 NOTE — ED Provider Notes (Signed)
[place] DEPT Provider Note   CSN: [id number] Arrival date & time: 04/07/21  1032     History Chief Complaint  Patient presents with  . Weakness    Ledford SARGENT MANKEY is a 87 y.o. male.  HPI Patient was sitting on commode this morning when he fell off of it, because he "blacked out."  There was no prolonged loss of consciousness.  Family was attended to him and called EMS who transferred him here with a cervical collar in place.  Patient arrived alert and conversant.  Patient's son is with him and states that the patient is debilitated because of ongoing difficulty eating, related to cancer diagnosis, and is weak today because he had paracentesis, 4 L, yesterday.  Patient typically gets weak for couple days after paracentesis.  He has been getting it weekly.  There is been no recent fever, cough, change in mental status.  He is currently on drug holiday from chemotherapy for cancer.  There are no other known modifying factors.    Past Medical History:  Diagnosis Date  . Ascending aortic aneurysm ([place]) 01/13/2014   stable at 4.7 cm diameter since 2009  . Asystole x2, s/p pacemaker 01/13/2014  . Bradycardia    permanent pacemaker 11/22/08 St.Jude  . Family history of adverse reaction to anesthesia    brother died after anesthesia / nose polyp removal at [facility] (40 years ago)  . HTN (hypertension) 01/13/2014  . Hyperlipidemia   . Hypertension   . Malignant ascites   . Pacemaker - leads 1987, last generator 2009 St. Jude dual-chamber 01/13/2014   Atrial lead model 433-01, ventricular lead model 431-01 implanted November 1987 Right ventricular lead dysfunction with low impedance and mediocre sensing but satisfactory pacing threshold  . Pacemaker lead malfunction 01/13/2014   Chronic low impedance and poor sensing of the ventricular lead  . Pancreatic cancer [facility])    Metastatic to liver    Patient Active Problem List   Diagnosis Date Noted  . Pancreatic cancer  metastasized to liver ([place]) 03/06/2021  . Blood pressure decreased 03/06/2021  . Pruritus 03/06/2021  . Malignant ascites 03/06/2021  . Genetic testing 06/23/2020  . Port-A-Cath in place 06/12/2020  . Goals of care, counseling/discussion 05/16/2020  . Cancer of pancreas, tail ([place]) 05/16/2020  . Coronary atherosclerosis 07/13/2014  . Ascending aortic aneurysm ([place]) 01/13/2014  . Asystole x2, s/p pacemaker 01/13/2014  . Pacemaker - leads 1987, last generator 2009 St. Jude dual-chamber 01/13/2014  . Pacemaker lead malfunction 01/13/2014  . HTN (hypertension) 01/13/2014  . Hyperlipidemia 01/13/2014    Past Surgical History:  Procedure Laterality Date  . IR IMAGING GUIDED PORT INSERTION  05/24/2020  . IR PARACENTESIS  10/26/2020  . IR PARACENTESIS  12/12/2020  . IR PARACENTESIS  01/22/2021  . IR PARACENTESIS  02/02/2021  . IR PARACENTESIS  02/14/2021  . IR PARACENTESIS  02/23/2021  . IR PARACENTESIS  03/06/2021  . IR PARACENTESIS  03/14/2021  . IR PARACENTESIS  03/22/2021  . IR PARACENTESIS  03/29/2021  . IR PARACENTESIS  04/06/2021  . PERMANENT PACEMAKER GENERATOR CHANGE  11/22/08   ST. Jude  . PERMANENT PACEMAKER INSERTION  03/04/97   ST. Jude  . US ECHOCARDIOGRAPHY  05/16/11   AS mild to mod.,TR mild to mod., EF => 55%.       Family History  Problem Relation Age of Onset  . Ovarian cancer Mother   . Heart attack Father     Social History   Tobacco  Use  . Smoking status: Never Smoker  . Smokeless tobacco: Never Used  Vaping Use  . Vaping Use: Never used  Substance Use Topics  . Alcohol use: Yes    Comment: occas.  . Drug use: No    Home Medications Prior to Admission medications   Medication Sig Start Date End Date Taking? Authorizing Provider  Dermatological Products, Misc. [facility]) lotion as needed. 03/02/21   [provider]  EUCRISA 2 % OINT apply all over body QD for maintenance 03/02/21   [provider]  Fluocinolone Acetonide Body 0.01 % OIL   03/02/21   [provider]  furosemide (LASIX) 20 MG tablet Take 1 tablet (20 mg total) by mouth daily. 04/03/21   Gatha Mayer, MD  hydrOXYzine (ATARAX/VISTARIL) 10 MG tablet Take 1 tablet (10 mg total) by mouth 3 (three) times daily as needed. 02/12/21   Ladell Pier, MD  levocetirizine (XYZAL) 5 MG tablet Take 5 mg by mouth at bedtime. 03/02/21   [provider]  lidocaine-prilocaine (EMLA) cream Apply 1 application topically as directed. Apply 1 hour prior to IV stick and cover with plastic wrap 07/18/20   Owens Shark, NP  metoprolol succinate (TOPROL-XL) 50 MG 24 hr tablet Take 0.5 tablets (25 mg total) by mouth daily. Take with or immediately following a meal. 03/07/21   Theora Gianotti, NP  mirtazapine (REMERON) 15 MG tablet Take 0.5 tablets (7.5 mg total) by mouth at bedtime. 01/22/21   Ladell Pier, MD  mupirocin ointment (BACTROBAN) 2 % Apply 1 application topically 3 (three) times daily. 10/23/20   Tanner, Lyndon Code., PA-C  omeprazole (PRILOSEC) 20 MG capsule Take 1-2 capsules by mouth daily. 01/22/21   [provider]  spironolactone (ALDACTONE) 50 MG tablet Take 1 tablet (50 mg total) by mouth daily. 04/03/21   Gatha Mayer, MD  ZYRTEC ALLERGY 10 MG tablet Take 1 tablet by mouth daily. 03/02/21   [provider]    Allergies    Gentamycin [gentamicin sulfate], Hydrocodone, and Aspirin  Review of Systems   Review of Systems  All other systems reviewed and are negative.   Physical Exam Updated Vital Signs BP 102/71 (BP Location: Left Arm)   Pulse 74   Temp (!) 97.2 F (36.2 C) (Oral)   Resp 20   Ht 5\' 5"  (1.651 m)   Wt 64 kg   SpO2 100%   BMI 23.46 kg/m   Physical Exam Vitals and nursing note reviewed.  Constitutional:      General: He is not in acute distress.    Appearance: He is well-developed. He is not ill-appearing or toxic-appearing.  HENT:     Head: Normocephalic and atraumatic.     Right Ear: External ear  normal.     Left Ear: External ear normal.     Mouth/Throat:     Mouth: Mucous membranes are dry.  Eyes:     Conjunctiva/sclera: Conjunctivae normal.     Pupils: Pupils are equal, round, and reactive to light.  Neck:     Trachea: Phonation normal.  Cardiovascular:     Rate and Rhythm: Normal rate and regular rhythm.     Heart sounds: Normal heart sounds.  Pulmonary:     Effort: Pulmonary effort is normal.     Breath sounds: Normal breath sounds.  Abdominal:     General: There is no distension.     Palpations: Abdomen is soft.     Tenderness: There is no  abdominal tenderness.  Musculoskeletal:        General: Normal range of motion.     Cervical back: Normal range of motion and neck supple.  Skin:    General: Skin is warm and dry.     Coloration: Skin is not jaundiced or pale.  Neurological:     Mental Status: He is alert.     Cranial Nerves: No cranial nerve deficit.     Sensory: No sensory deficit.     Motor: No abnormal muscle tone.     Coordination: Coordination normal.  Psychiatric:        Mood and Affect: Mood normal.        Behavior: Behavior normal.     ED Results / Procedures / Treatments   Labs (all labs ordered are listed, but only abnormal results are displayed) Labs Reviewed - No data to display  EKG EKG Interpretation  Date/Time:  Saturday April 07 2021 11:32:57 EDT Ventricular Rate:  60 PR Interval:  190 QRS Duration: 96 QT Interval:  440 QTC Calculation: 440 R Axis:   56 Text Interpretation: Sinus rhythm Borderline low voltage, extremity leads since last tracing no significant change Confirmed by Daleen Bo [phone]) on 04/07/2021 12:40:19 PM   Radiology CT Head Wo Contrast  Result Date: 04/07/2021 CLINICAL DATA:  Syncope, head trauma EXAM: CT HEAD WITHOUT CONTRAST CT CERVICAL SPINE WITHOUT CONTRAST TECHNIQUE: Multidetector CT imaging of the head and cervical spine was performed following the standard protocol without intravenous contrast.  Multiplanar CT image reconstructions of the cervical spine were also generated. COMPARISON:  12/11/2008 FINDINGS: CT HEAD FINDINGS Brain: No evidence of acute infarction, hemorrhage, hydrocephalus, extra-axial collection or mass lesion/mass effect. Scattered low-density changes within the periventricular and subcortical white matter compatible with chronic microvascular ischemic change. Mild diffuse cerebral volume loss. Vascular: Atherosclerotic calcifications involving the large vessels of the skull base. No unexpected hyperdense vessel. Skull: Normal. Negative for fracture or focal lesion. Sinuses/Orbits: Probable retention cyst within the inferior left maxillary sinus. Mild anterior ethmoid mucosal thickening. Other: Negative for scalp hematoma. CT CERVICAL SPINE FINDINGS Alignment: Facet joints are aligned without dislocation or traumatic listhesis. Dens and lateral masses are aligned. Skull base and vertebrae: No acute fracture. No primary bone lesion or focal pathologic process. Soft tissues and spinal canal: No prevertebral fluid or swelling. No visible canal hematoma. Disc levels: Mild disc height loss and endplate spurring is most pronounced at C3-4 and C5-6. Multilevel mild facet and uncovertebral arthropathy. Upper chest: Included lung apices are clear. Other: Bilateral carotid atherosclerosis. IMPRESSION: 1. No acute intracranial findings. 2. Chronic microvascular ischemic changes and cerebral volume loss. 3. No evidence of acute fracture or traumatic listhesis of the cervical spine. 4. Mild multilevel degenerative disc disease and facet arthropathy of the cervical spine. Electronically Signed   By: Davina Poke D.O.   On: 04/07/2021 11:49   CT Cervical Spine Wo Contrast  Result Date: 04/07/2021 CLINICAL DATA:  Syncope, head trauma EXAM: CT HEAD WITHOUT CONTRAST CT CERVICAL SPINE WITHOUT CONTRAST TECHNIQUE: Multidetector CT imaging of the head and cervical spine was performed following the  standard protocol without intravenous contrast. Multiplanar CT image reconstructions of the cervical spine were also generated. COMPARISON:  12/11/2008 FINDINGS: CT HEAD FINDINGS Brain: No evidence of acute infarction, hemorrhage, hydrocephalus, extra-axial collection or mass lesion/mass effect. Scattered low-density changes within the periventricular and subcortical white matter compatible with chronic microvascular ischemic change. Mild diffuse cerebral volume loss. Vascular: Atherosclerotic calcifications involving the large vessels of the  skull base. No unexpected hyperdense vessel. Skull: Normal. Negative for fracture or focal lesion. Sinuses/Orbits: Probable retention cyst within the inferior left maxillary sinus. Mild anterior ethmoid mucosal thickening. Other: Negative for scalp hematoma. CT CERVICAL SPINE FINDINGS Alignment: Facet joints are aligned without dislocation or traumatic listhesis. Dens and lateral masses are aligned. Skull base and vertebrae: No acute fracture. No primary bone lesion or focal pathologic process. Soft tissues and spinal canal: No prevertebral fluid or swelling. No visible canal hematoma. Disc levels: Mild disc height loss and endplate spurring is most pronounced at C3-4 and C5-6. Multilevel mild facet and uncovertebral arthropathy. Upper chest: Included lung apices are clear. Other: Bilateral carotid atherosclerosis. IMPRESSION: 1. No acute intracranial findings. 2. Chronic microvascular ischemic changes and cerebral volume loss. 3. No evidence of acute fracture or traumatic listhesis of the cervical spine. 4. Mild multilevel degenerative disc disease and facet arthropathy of the cervical spine. Electronically Signed   By: Davina Poke D.O.   On: 04/07/2021 11:49   IR Paracentesis  Result Date: 04/06/2021 INDICATION: Patient with a history of metastatic pancreatic cancer and recurrent ascites. Interventional radiology asked to perform a therapeutic paracentesis. EXAM:  ULTRASOUND GUIDED PARACENTESIS MEDICATIONS: 1% lidocaine 20 mL COMPLICATIONS: None immediate. PROCEDURE: Informed written consent was obtained from the patient after a discussion of the risks, benefits and alternatives to treatment. A timeout was performed prior to the initiation of the procedure. Initial ultrasound scanning demonstrates a large amount of ascites within the right lower abdominal quadrant. The right lower abdomen was prepped and draped in the usual sterile fashion. 1% lidocaine was used for local anesthesia. Following this, a 19 gauge, 7-cm, Yueh catheter was introduced. An ultrasound image was saved for documentation purposes. The paracentesis was performed. The catheter was removed and a dressing was applied. The patient tolerated the procedure well without immediate post procedural complication. FINDINGS: A total of approximately 4 L of clear yellow fluid was removed. IMPRESSION: Successful ultrasound-guided paracentesis yielding 4 liters of peritoneal fluid. Read by: Soyla Dryer, NP Electronically Signed   By: Ruthann Cancer MD   On: 04/06/2021 14:16    Procedures .Marland KitchenLaceration Repair  Date/Time: 04/07/2021 12:37 PM Performed by: Daleen Bo, MD Authorized by: Daleen Bo, MD   Consent:    Consent obtained:  Verbal   Consent given by:  Healthcare agent   Risks, benefits, and alternatives were discussed: yes     Risks discussed:  Infection, pain, need for additional repair and poor wound healing   Alternatives discussed:  No treatment Universal protocol:    Immediately prior to procedure, a time out was called: yes     Patient identity confirmed:  Verbally with patient Anesthesia:    Anesthesia method:  None Laceration details:    Location:  Scalp   Scalp location:  Frontal   Length (cm):  3.5   Depth (mm):  4 Pre-procedure details:    Preparation:  Patient was prepped and draped in usual sterile fashion Exploration:    Limited defect created (wound extended):  no     Hemostasis achieved with:  Direct pressure   Wound extent: no areolar tissue violation noted, no fascia violation noted, no foreign bodies/material noted, no muscle damage noted, no underlying fracture noted and no vascular damage noted     Contaminated: no   Treatment:    Area cleansed with:  Saline   Amount of cleaning:  Standard   Irrigation solution:  Sterile saline   Debridement:  None Skin repair:  Repair method:  Tissue adhesive Approximation:    Approximation:  Loose     Medications Ordered in ED Medications - No data to display  ED Course  I have reviewed the triage vital signs and the nursing notes.  Pertinent labs & imaging results that were available during my care of the patient were reviewed by me and considered in my medical decision making (see chart for details).    MDM Rules/Calculators/A&P                           Patient Vitals for the past 24 hrs:  BP Temp Temp src Pulse Resp SpO2 Height Weight  04/07/21 1212 102/71 (!) 97.2 F (36.2 C) Oral 74 20 100 % -- --  04/07/21 1049 -- -- -- -- -- -- 5\' 5"  (1.651 m) 64 kg  04/07/21 1048 109/66 (!) 97.4 F (36.3 C) Oral 61 18 98 % -- --  04/07/21 1047 -- -- -- -- -- 98 % -- --  04/07/21 1041 -- -- -- -- -- 96 % -- --    12:43 PM Reevaluation with update and discussion. After initial assessment and treatment, an updated evaluation reveals he remains alert and comfortable.  5 discussed with patient and son, at bedside, all questions answered. Daleen Bo   Medical Decision Making:  This patient is presenting for evaluation of fall with syncope, while seated on commode, which does require a range of treatment options, and is a complaint that involves a moderate risk of morbidity and mortality. The differential diagnoses include cardiac abnormality, volume depletion, nutritional deficit. I decided to review old records, and in summary elderly male, presenting for evaluation of syncope with injury to  head.  He had paracentesis of large volume yesterday..  I am additional historical information from Son at the bedside.   Radiologic Tests Ordered, included CT image.  I independently Visualized: Radiograph images, which show no acute injuries or head or cervical spine.  Moderate degenerative change in cervical spine   Critical Interventions-clinical evaluation, radiologic imaging, EKG, observation, laceration repair with wound adhesive and reassessment  After These Interventions, the Patient was reevaluated and was found stable for discharge.  CRITICAL CARE-no Performed by: Daleen Bo  Nursing Notes Reviewed/ Care Coordinated Applicable Imaging Reviewed Interpretation of Laboratory Data incorporated into ED treatment  The patient appears reasonably screened and/or stabilized for discharge and I doubt any other medical condition or other [facility] requiring further screening, evaluation, or treatment in the ED at this time prior to discharge.  Plan: Home Medications-continue usual and use Tylenol for pain; Home Treatments-rest, fluids, regular diet; return here if the recommended treatment, does not improve the symptoms; Recommended follow up-PCP, as needed     Final Clinical Impression(s) / ED Diagnoses Final diagnoses:  Syncope, unspecified syncope type  Laceration of occipital region of scalp, initial encounter  Spondylosis of cervical region without myelopathy or radiculopathy    Rx / DC Orders ED Discharge Orders    None       Daleen Bo, MD 04/07/21 1244

## 2021-04-07 NOTE — ED Triage Notes (Signed)
Patient lives at home- History of Pancreatic cancer.  EMS reports that he has been having abd fluid taken off weekly and the last was yesterday, removing 4 liters.  Patient had a syncopal episode at home while going to the bathroom.  Decreased appetite x 3 weeks and having multiple bouts of diarrhea this morning. Last chemo treatment was 3 months ago.  Patient answers questions appropriately.  Small laceration noted to the left scalp area.  C collar in place.

## 2021-04-07 NOTE — ED Notes (Signed)
Written discharge instructions given to the patient's son.  He verbalizes understanding those instructions.

## 2021-04-07 NOTE — ED Notes (Signed)
Returns from CT 

## 2021-04-07 NOTE — ED Notes (Signed)
Small head laceration cleansed with normal saline- no active bleeding.  Dermabond is at the bedside

## 2021-04-11 ENCOUNTER — Inpatient Hospital Stay: Payer: Medicare Other

## 2021-04-11 ENCOUNTER — Inpatient Hospital Stay (HOSPITAL_BASED_OUTPATIENT_CLINIC_OR_DEPARTMENT_OTHER): Payer: Medicare Other | Admitting: Nurse Practitioner

## 2021-04-11 ENCOUNTER — Other Ambulatory Visit: Payer: Self-pay

## 2021-04-11 ENCOUNTER — Telehealth: Payer: Self-pay | Admitting: *Deleted

## 2021-04-11 VITALS — BP 123/83 | HR 83 | Temp 97.8°F | Resp 18 | Ht 65.0 in | Wt 143.8 lb

## 2021-04-11 DIAGNOSIS — C252 Malignant neoplasm of tail of pancreas: Secondary | ICD-10-CM

## 2021-04-11 DIAGNOSIS — L299 Pruritus, unspecified: Secondary | ICD-10-CM | POA: Diagnosis not present

## 2021-04-11 DIAGNOSIS — Z95828 Presence of other vascular implants and grafts: Secondary | ICD-10-CM

## 2021-04-11 DIAGNOSIS — E785 Hyperlipidemia, unspecified: Secondary | ICD-10-CM | POA: Diagnosis not present

## 2021-04-11 DIAGNOSIS — R109 Unspecified abdominal pain: Secondary | ICD-10-CM | POA: Diagnosis not present

## 2021-04-11 DIAGNOSIS — R600 Localized edema: Secondary | ICD-10-CM | POA: Diagnosis not present

## 2021-04-11 DIAGNOSIS — I10 Essential (primary) hypertension: Secondary | ICD-10-CM | POA: Diagnosis not present

## 2021-04-11 LAB — CMP (CANCER CENTER ONLY)
ALT: 12 U/L (ref 0–44)
AST: 21 U/L (ref 15–41)
Albumin: 2.8 g/dL — ABNORMAL LOW (ref 3.5–5.0)
Alkaline Phosphatase: 108 U/L (ref 38–126)
Anion gap: 9 (ref 5–15)
BUN: 41 mg/dL — ABNORMAL HIGH (ref 8–23)
CO2: 23 mmol/L (ref 22–32)
Calcium: 8.7 mg/dL — ABNORMAL LOW (ref 8.9–10.3)
Chloride: 104 mmol/L (ref 98–111)
Creatinine: 1.07 mg/dL (ref 0.61–1.24)
GFR, Estimated: >60 mL/min (ref >60)
Glucose, Bld: 88 mg/dL (ref 70–99)
Potassium: 3.7 mmol/L (ref 3.5–5.1)
Sodium: 136 mmol/L (ref 135–145)
Total Bilirubin: 0.7 mg/dL (ref 0.3–1.2)
Total Protein: 6.1 g/dL — ABNORMAL LOW (ref 6.5–8.1)

## 2021-04-11 LAB — CBC WITH DIFFERENTIAL (CANCER CENTER ONLY)
Abs Immature Granulocytes: 0.04 10*3/uL (ref 0.00–0.07)
Basophils Absolute: 0 10*3/uL (ref 0.0–0.1)
Basophils Relative: 0 %
Eosinophils Absolute: 0 10*3/uL (ref 0.0–0.5)
Eosinophils Relative: 0 %
HCT: 39.2 % (ref 39.0–52.0)
Hemoglobin: 12.6 g/dL — ABNORMAL LOW (ref 13.0–17.0)
Immature Granulocytes: 1 %
Lymphocytes Relative: 16 %
Lymphs Abs: 1.2 10*3/uL (ref 0.7–4.0)
MCH: 28.3 pg (ref 26.0–34.0)
MCHC: 32.1 g/dL (ref 30.0–36.0)
MCV: 87.9 fL (ref 80.0–100.0)
Monocytes Absolute: 0.8 10*3/uL (ref 0.1–1.0)
Monocytes Relative: 11 %
Neutro Abs: 5.3 10*3/uL (ref 1.7–7.7)
Neutrophils Relative %: 72 %
Platelet Count: 187 10*3/uL (ref 150–400)
RBC: 4.46 MIL/uL (ref 4.22–5.81)
RDW: 15.6 % — ABNORMAL HIGH (ref 11.5–15.5)
WBC Count: 7.4 10*3/uL (ref 4.0–10.5)
nRBC: 0 % (ref 0.0–0.2)

## 2021-04-11 MED ORDER — SODIUM CHLORIDE 0.9% FLUSH
10.0000 mL | INTRAVENOUS | Status: DC | PRN
  Filled 2021-04-11: qty 10

## 2021-04-11 NOTE — Telephone Encounter (Signed)
Patient had N/V and diarrhea despite Imodium on Saturday. Felt weak and dizzy w/syncope and put gash in his scalp. Taken to ER and glued scalp wound closed and sent home. Now worried that he is still very weak and dizzy and want him seen today. Also has not had BM since Saturday. Informed her that with diarrhea and taking Imodium it is not unusual that he has not had a BM, especially if not eating much.  Per Ned Card, NP: come in today at 0930 for labs and to be seen at 1015

## 2021-04-11 NOTE — Progress Notes (Addendum)
[place] OFFICE PROGRESS NOTE   Diagnosis: Pancreas cancer  INTERVAL HISTORY:   Mr. Cohron returns prior to scheduled follow-up.  He resumed spironolactone and began furosemide 04/04/2021.  He underwent a paracentesis procedure on 04/06/2021 with 4 L of fluid removed.  On the morning of 04/07/2021 he developed diarrhea, subsequently "passed out".  He was seen in the emergency department.  He sustained a scalp laceration.  Brain and cervical spine CTs with no acute findings.  This morning he felt very weak and dizzy.  At present he denies dizziness.  He is concerned he is not had a bowel movement in 3 to 4 days.  He had some prune juice.  He denies nausea/vomiting.  No bleeding except related to the scalp laceration.  His son reports fairly oral good fluid intake.  Objective:  Vital signs in last 24 hours:  Blood pressure 123/83, pulse 83, temperature 97.8 F (36.6 C), temperature source Oral, resp. rate 18, height 5\' 5"  (1.651 m), weight 143 lb 12.8 oz (65.2 kg), SpO2 90 %.    HEENT: No thrush or ulcers.  Mucous membranes mildly dry appearing. Resp: Lungs clear bilaterally. Cardio: Regular rate and rhythm. GI: Abdomen is soft, distended.  Abdominal exam consistent with ascites. Vascular: No leg edema. Neuro: Alert and oriented. Skin: Decreased skin turgor.  Skin scalp laceration intact with glue.  No surrounding erythema. Port-A-Cath without erythema.   Lab Results:  Lab Results  Component Value Date   WBC 7.4 04/11/2021   HGB 12.6 (L) 04/11/2021   HCT 39.2 04/11/2021   MCV 87.9 04/11/2021   PLT 187 04/11/2021   NEUTROABS 5.3 04/11/2021    Imaging:  No results found.  Medications: I have reviewed the patient's current medications.  Assessment/Plan: 1. Pancreas tail lesion, multiple liver lesions   Chest CT 04/12/2020-ascending thoracic aortic aneurysm; hypovascular lesion tail of the pancreas, new compared to the prior examination. Multiple poorly  defined hypovascular lesions scattered throughout the liver   CT abdomen/pelvis 05/02/2020-hypoenhancing lesion of the pancreatic tail measuring approximately 2.4 x 1.9 cm; enlarged portacaval lymph nodes measuring up to 1.9 x 1.4 cm; numerous hypodense lesions throughout the liver, index lesion of the liver dome measuring 1.7 x 1.2 cm.  Ultrasound-guided biopsy of a left liver lesion 05/10/2020-poorly differentiated carcinoma consistent with a pancreatobiliary primary  Markedly elevated CA 19-9  Cycle 1day 1gemcitabine/Abraxane 05/26/2020  Cycle1 day 15gemcitabine/Abraxane 06/12/2020  Cycle2 day 1gemcitabine/Abraxane 07/04/2020(chemotherapy doses reduced, antiemetic regimen adjusted)  Cycle2 day 15Gemcitabine/Abraxane 07/18/2020(continue same dose reduction, reduce post-treatment steroids due to insomnia)  Cycle 3 day 1 gem/abraxane 08/02/20  Cycle 3 day 15 gem/Abraxane 08/15/2020  CTs abdomen/pelvis 08/25/2020-pancreas tail mass and portacaval lymphadenopathy decreased. Liver metastases reported as increased, further review with no significant change in the majority of liver lesions and a decrease in the size of a few lesions, no new margin lesions  Cycle 4-day 1 gemcitabine/Abraxane 09/05/2020  Paracentesis 09/26/2020-reactive mesothelial cells  Paracentesis 10/09/2020-reactive mesothelial cells  Cycle 5 gemcitabine/Abraxane 10/12/2020  Cycle 6 gemcitabine/Abraxane 11/01/2020  Cycle 7 gemcitabine/Abraxane 11/22/2020  Cycle 8 gemcitabine/Abraxane 12/13/2020  Cycle 9 gemcitabine/Abraxane 01/03/2021  CT abdomen/pelvis 01/29/2021-decrease in pancreas tail mass, decrease in size of multiple hepatic metastases, decreased size of hepatoduodenal ligament lymph nodes, progressive ascites  Cycle 10 gemcitabine/Abraxane 01/31/2021  2. Thoracic aortic aneurysm 3. Hypertension 4. Hyperlipidemia 5. History of bradycardia status post permanent pacemaker 6. Mother  hadcervicalcancer 7. Pitting lower leg edema 8. GERD, on omeprazole 9. Right upper back inflamed sebaceous  cyst-culture from cyst drainage 10/23/2020-2 staph species, antibiotic changed to ciprofloxacin 10/27/2020, status post I&D procedure 11/17/2020 10. Refractory ascites-trial of spironolactone 03/23/2021; spironolactone and Lasix 04/04/2021   Disposition: Mr. Caspers has metastatic pancreas cancer.  He is currently maintained off of systemic therapy.    He has refractory ascites and recently began a trial of spironolactone and Lasix.  He had a paracentesis with 4 L of fluid removed on 04/06/2021.  He developed diarrhea on 04/07/2021, subsequent syncopal episode.  He was evaluated in the emergency department.  Likely etiology of syncopal episode is dehydration, multifactorial, related to diuretic use, paracentesis and diarrhea. He will place Lasix on hold, continue spironolactone.  Decrease frequency of paracentesis procedures and limit to 3 L.  He is scheduled for restaging CT scans 04/13/2021.  He will return for follow-up on 04/19/2021.  We are available to see him sooner if needed.  Patient seen with Dr. Benay Spice.    Ned Card ANP/GNP-BC   04/11/2021  10:31 AM  This was a shared visit with Ned Card.  Mr. Pyon was interviewed and examined.  The syncope episode last weekend was likely related to intravascular depletion following a large-volume paracentesis and diuretic therapy.  We discontinued furosemide.  We recommend he decrease the paracentesis frequency and limit the paracentesis volume to 3 L.  He remains off of specific therapy for pancreas cancer.  He is scheduled undergo restaging CTs later this week.  I was present for greater than 50% of today's visit.  I perform medical decision making.  Julieanne Manson, MD

## 2021-04-12 LAB — CANCER ANTIGEN 19-9: CA 19-9: 366 U/mL — ABNORMAL HIGH (ref 0–35)

## 2021-04-12 MED ORDER — PEGFILGRASTIM-CBQV 6 MG/0.6ML ~~LOC~~ SOSY
PREFILLED_SYRINGE | SUBCUTANEOUS | Status: AC
  Filled 2021-04-12: qty 0.6

## 2021-04-13 ENCOUNTER — Other Ambulatory Visit: Payer: Medicare Other

## 2021-04-13 ENCOUNTER — Ambulatory Visit (HOSPITAL_BASED_OUTPATIENT_CLINIC_OR_DEPARTMENT_OTHER)
Admission: RE | Admit: 2021-04-13 | Discharge: 2021-04-13 | Disposition: A | Discharge status: Home / Self Care | Payer: Medicare Other | Source: Ambulatory Visit | Attending: Nurse Practitioner | Admitting: Nurse Practitioner

## 2021-04-13 ENCOUNTER — Other Ambulatory Visit: Payer: Self-pay

## 2021-04-13 ENCOUNTER — Inpatient Hospital Stay: Payer: Medicare Other

## 2021-04-13 DIAGNOSIS — E785 Hyperlipidemia, unspecified: Secondary | ICD-10-CM | POA: Diagnosis not present

## 2021-04-13 DIAGNOSIS — R600 Localized edema: Secondary | ICD-10-CM | POA: Diagnosis not present

## 2021-04-13 DIAGNOSIS — R109 Unspecified abdominal pain: Secondary | ICD-10-CM | POA: Diagnosis not present

## 2021-04-13 DIAGNOSIS — C252 Malignant neoplasm of tail of pancreas: Secondary | ICD-10-CM | POA: Diagnosis not present

## 2021-04-13 DIAGNOSIS — L299 Pruritus, unspecified: Secondary | ICD-10-CM | POA: Diagnosis not present

## 2021-04-13 DIAGNOSIS — I10 Essential (primary) hypertension: Secondary | ICD-10-CM | POA: Diagnosis not present

## 2021-04-13 DIAGNOSIS — C259 Malignant neoplasm of pancreas, unspecified: Secondary | ICD-10-CM | POA: Diagnosis not present

## 2021-04-13 DIAGNOSIS — Z95828 Presence of other vascular implants and grafts: Secondary | ICD-10-CM

## 2021-04-13 MED ORDER — IOHEXOL 300 MG/ML  SOLN
100.0000 mL | Freq: Once | INTRAMUSCULAR | Status: AC | PRN
  Administered 2021-04-13: 100 mL via INTRAVENOUS

## 2021-04-13 MED ORDER — SODIUM CHLORIDE 0.9% FLUSH
10.0000 mL | INTRAVENOUS | Status: DC | PRN
  Administered 2021-04-13: 10 mL
  Filled 2021-04-13: qty 10

## 2021-04-13 MED ORDER — HEPARIN SOD (PORK) LOCK FLUSH 100 UNIT/ML IV SOLN
500.0000 [IU] | Freq: Once | INTRAVENOUS | Status: AC | PRN
  Administered 2021-04-13: 500 [IU]
  Filled 2021-04-13: qty 5

## 2021-04-16 ENCOUNTER — Other Ambulatory Visit: Payer: Self-pay | Admitting: Nurse Practitioner

## 2021-04-16 DIAGNOSIS — C252 Malignant neoplasm of tail of pancreas: Secondary | ICD-10-CM

## 2021-04-17 ENCOUNTER — Ambulatory Visit (HOSPITAL_COMMUNITY)
Admission: RE | Admit: 2021-04-17 | Discharge: 2021-04-17 | Disposition: A | Discharge status: Home / Self Care | Payer: Medicare Other | Source: Ambulatory Visit | Attending: Nurse Practitioner | Admitting: Nurse Practitioner

## 2021-04-17 ENCOUNTER — Other Ambulatory Visit: Payer: Self-pay

## 2021-04-17 DIAGNOSIS — C252 Malignant neoplasm of tail of pancreas: Secondary | ICD-10-CM | POA: Insufficient documentation

## 2021-04-17 DIAGNOSIS — R188 Other ascites: Secondary | ICD-10-CM | POA: Diagnosis not present

## 2021-04-17 HISTORY — PX: IR PARACENTESIS: IMG2679

## 2021-04-17 MED ORDER — LIDOCAINE HCL (PF) 1 % IJ SOLN
INTRAMUSCULAR | Status: DC | PRN
  Administered 2021-04-17: 10 mL

## 2021-04-17 MED ORDER — LIDOCAINE HCL 1 % IJ SOLN
INTRAMUSCULAR | Status: AC
  Filled 2021-04-17: qty 20

## 2021-04-17 NOTE — Procedures (Signed)
PROCEDURE SUMMARY:  Successful image-guided paracentesis from the right lower abdomen.  Yielded 3 liters of clear yellow fluid.  No immediate complications.  EBL = trace. Patient tolerated well.   Specimen was not sent for labs.  Please see imaging section of Epic for full dictation.   Mosi Hannold H Raiford Fetterman PA-C 04/17/2021 1:40 PM

## 2021-04-18 DIAGNOSIS — L209 Atopic dermatitis, unspecified: Secondary | ICD-10-CM | POA: Diagnosis not present

## 2021-04-19 ENCOUNTER — Ambulatory Visit: Payer: Medicare Other | Attending: Internal Medicine

## 2021-04-19 ENCOUNTER — Inpatient Hospital Stay: Payer: Medicare Other | Attending: Oncology | Admitting: Oncology

## 2021-04-19 ENCOUNTER — Other Ambulatory Visit (HOSPITAL_BASED_OUTPATIENT_CLINIC_OR_DEPARTMENT_OTHER): Payer: Self-pay

## 2021-04-19 ENCOUNTER — Other Ambulatory Visit: Payer: Self-pay

## 2021-04-19 VITALS — BP 107/70 | HR 76 | Temp 97.8°F | Resp 18 | Ht 65.0 in | Wt 147.4 lb

## 2021-04-19 DIAGNOSIS — L89159 Pressure ulcer of sacral region, unspecified stage: Secondary | ICD-10-CM | POA: Diagnosis not present

## 2021-04-19 DIAGNOSIS — R188 Other ascites: Secondary | ICD-10-CM | POA: Diagnosis not present

## 2021-04-19 DIAGNOSIS — I10 Essential (primary) hypertension: Secondary | ICD-10-CM | POA: Insufficient documentation

## 2021-04-19 DIAGNOSIS — Z950 Presence of cardiac pacemaker: Secondary | ICD-10-CM | POA: Diagnosis not present

## 2021-04-19 DIAGNOSIS — C787 Secondary malignant neoplasm of liver and intrahepatic bile duct: Secondary | ICD-10-CM | POA: Diagnosis not present

## 2021-04-19 DIAGNOSIS — C252 Malignant neoplasm of tail of pancreas: Secondary | ICD-10-CM | POA: Diagnosis not present

## 2021-04-19 DIAGNOSIS — K8689 Other specified diseases of pancreas: Secondary | ICD-10-CM | POA: Diagnosis not present

## 2021-04-19 DIAGNOSIS — Z23 Encounter for immunization: Secondary | ICD-10-CM

## 2021-04-19 MED ORDER — COVID-19 MRNA VACC (MODERNA) 100 MCG/0.5ML IM SUSP
INTRAMUSCULAR | 0 refills | Status: DC
  Filled 2021-04-19: qty 0.25, 1d supply, fill #0

## 2021-04-19 NOTE — Progress Notes (Signed)
   Covid-19 Vaccination Clinic  Name:  MARINO ROGERSON    MRN: [id number] DOB: May 31, 1932  04/19/2021  Mr. Figueira was observed post Covid-19 immunization for 15 minutes without incident. He was provided with Vaccine Information Sheet and instruction to access the V-Safe system.   Mr. Sedore was instructed to call 911 with any severe reactions post vaccine: Marland Kitchen Difficulty breathing  . Swelling of face and throat  . A fast heartbeat  . A bad rash all over body  . Dizziness and weakness   Immunizations Administered    Name Date Dose VIS Date Route   Moderna Covid-19 Booster Vaccine 04/19/2021  1:07 PM 0.25 mL 10/04/2020 Intramuscular   Manufacturer: Moderna   Lot: [id number]   [place]: 14782-956-21

## 2021-04-19 NOTE — Progress Notes (Signed)
[place] OFFICE PROGRESS NOTE   Diagnosis: Pancreas cancer  INTERVAL HISTORY:   Gary [name] returns as scheduled.  He remains off of specific treatment for pancreas cancer.  He continues to have malaise.  No further falls.  He is no longer taking Lasix.  He takes spironolactone daily.  He underwent a paracentesis for 3 L of fluid on 04/17/2021.    Objective:  Vital signs in last 24 hours:  Blood pressure 107/70, pulse 76, temperature 97.8 F (36.6 C), temperature source Oral, resp. rate 18, height 5\' 5"  (1.651 m), weight 147 lb 6.4 oz (66.9 kg), SpO2 100 %.     Resp: Lungs clear bilaterally Cardio: Regular rate and rhythm GI: Mildly distended with ascites, no mass, nontender Vascular: No leg edema     Portacath/PICC-without erythema  Lab Results:  Lab Results  Component Value Date   WBC 7.4 04/11/2021   HGB 12.6 (L) 04/11/2021   HCT 39.2 04/11/2021   MCV 87.9 04/11/2021   PLT 187 04/11/2021   NEUTROABS 5.3 04/11/2021    CMP  Lab Results  Component Value Date   NA 136 04/11/2021   K 3.7 04/11/2021   CL 104 04/11/2021   CO2 23 04/11/2021   GLUCOSE 88 04/11/2021   BUN 41 (H) 04/11/2021   CREATININE 1.07 04/11/2021   CALCIUM 8.7 (L) 04/11/2021   PROT 6.1 (L) 04/11/2021   ALBUMIN 2.8 (L) 04/11/2021   AST 21 04/11/2021   ALT 12 04/11/2021   ALKPHOS 108 04/11/2021   BILITOT 0.7 04/11/2021   GFRNONAA >60 04/11/2021   GFRAA >60 09/05/2020   CA125 on 04/11/2021: 366   Imaging:  IR Paracentesis  Result Date: 04/17/2021 INDICATION: History of metastatic pancreatic cancer, recurrence ascites. Request for therapeutic paracentesis. EXAM: ULTRASOUND GUIDED  PARACENTESIS MEDICATIONS: 10 mL 1% lidocaine COMPLICATIONS: None immediate. PROCEDURE: Informed written consent was obtained from the patient after a discussion of the risks, benefits and alternatives to treatment. A timeout was performed prior to the initiation of the procedure. Initial ultrasound  scanning demonstrates a large amount of ascites within the right lower abdominal quadrant. The right lower abdomen was prepped and draped in the usual sterile fashion. 1% lidocaine was used for local anesthesia. Following this, a 19 gauge, 7-cm, Yueh catheter was introduced. An ultrasound image was saved for documentation purposes. The paracentesis was performed. The catheter was removed and a dressing was applied. The patient tolerated the procedure well without immediate post procedural complication. FINDINGS: A total of approximately 3 L of clear yellow fluid was removed. IMPRESSION: Successful ultrasound-guided paracentesis yielding 3 liters of peritoneal fluid. Read by: Durenda Guthrie, PA-C Electronically Signed   By: Jacqulynn Cadet M.D.   On: 04/17/2021 15:32    Medications: I have reviewed the patient's current medications.   Assessment/Plan: 1. Pancreas tail lesion, multiple liver lesions   Chest CT 04/12/2020-ascending thoracic aortic aneurysm; hypovascular lesion tail of the pancreas, new compared to the prior examination. Multiple poorly defined hypovascular lesions scattered throughout the liver   CT abdomen/pelvis 05/02/2020-hypoenhancing lesion of the pancreatic tail measuring approximately 2.4 x 1.9 cm; enlarged portacaval lymph nodes measuring up to 1.9 x 1.4 cm; numerous hypodense lesions throughout the liver, index lesion of the liver dome measuring 1.7 x 1.2 cm.  Ultrasound-guided biopsy of a left liver lesion 05/10/2020-poorly differentiated carcinoma consistent with a pancreatobiliary primary  Markedly elevated CA 19-9  Cycle 1day 1gemcitabine/Abraxane 05/26/2020  Cycle1 day 15gemcitabine/Abraxane 06/12/2020  Cycle2 day 1gemcitabine/Abraxane 07/04/2020(chemotherapy doses reduced,  antiemetic regimen adjusted)  Cycle2 day 15Gemcitabine/Abraxane 07/18/2020(continue same dose reduction, reduce post-treatment steroids due to insomnia)  Cycle 3 day 1 gem/abraxane  08/02/20  Cycle 3 day 15 gem/Abraxane 08/15/2020  CTs abdomen/pelvis 08/25/2020-pancreas tail mass and portacaval lymphadenopathy decreased. Liver metastases reported as increased, further review with no significant change in the majority of liver lesions and a decrease in the size of a few lesions, no new margin lesions  Cycle 4-day 1 gemcitabine/Abraxane 09/05/2020  Paracentesis 09/26/2020-reactive mesothelial cells  Paracentesis 10/09/2020-reactive mesothelial cells  Cycle 5 gemcitabine/Abraxane 10/12/2020  Cycle 6 gemcitabine/Abraxane 11/01/2020  Cycle 7 gemcitabine/Abraxane 11/22/2020  Cycle 8 gemcitabine/Abraxane 12/13/2020  Cycle 9 gemcitabine/Abraxane 01/03/2021  CT abdomen/pelvis 01/29/2021-decrease in pancreas tail mass, decrease in size of multiple hepatic metastases, decreased size of hepatoduodenal ligament lymph nodes, progressive ascites  Cycle 10 gemcitabine/Abraxane 01/31/2021  CT abdomen/pelvis 04/13/2021- generally stable liver lesions, some have slightly decreased and others slightly increased compared to the CT 01/29/2021, pancreas mass not appreciated, large volume ascites  2. Thoracic aortic aneurysm 3. Hypertension 4. Hyperlipidemia 5. History of bradycardia status post permanent pacemaker 6. Mother hadcervicalcancer 7. Pitting lower leg edema 8. GERD, on omeprazole 9. Right upper back inflamed sebaceous cyst-culture from cyst drainage 10/23/2020-2 staph species, antibiotic changed to ciprofloxacin 10/27/2020, status post I&D procedure 11/17/2020 10. Refractory ascites-trial of spironolactone 03/23/2021; spironolactone and Lasix 04/04/2021     Disposition: Gary [name] appears stable.  The restaging CT reveals no clear evidence of disease progression.  I reviewed the CT images with Gary [name] and his son.  The plan is to continue observation.  He has refractory ascites of unclear etiology.  He will continue spironolactone and intermittent paracentesis  procedures as needed.  He feels weak in general.  This is likely multifactorial.  His blood pressure has been low for several months.  We will contact cardiology to see if they are okay decreasing or stopping the metoprolol.  Gary [name] will return for an office visit in 3 weeks.  Betsy Coder, MD  04/19/2021  12:18 PM

## 2021-04-23 ENCOUNTER — Telehealth: Payer: Self-pay

## 2021-04-23 NOTE — Telephone Encounter (Signed)
Returned call to daughter(judy) caller requested paracentesis this nurse made provider aware and received instructions to make family aware provider would like pt to wait at least 2 weeks in between procedures and to follow-up with Dr. Nita Sickle daughter states she understands and will call to make appt

## 2021-04-24 ENCOUNTER — Telehealth: Payer: Self-pay | Admitting: Nurse Practitioner

## 2021-04-24 ENCOUNTER — Other Ambulatory Visit: Payer: Self-pay | Admitting: Nurse Practitioner

## 2021-04-24 DIAGNOSIS — C252 Malignant neoplasm of tail of pancreas: Secondary | ICD-10-CM

## 2021-04-24 NOTE — Telephone Encounter (Signed)
I returned a call to Gary [name] daughter, Gary [name].  She had called earlier today to request a paracentesis for her father.  We discussed the rationale for trying to limit the frequency of these procedures (syncopal episode on 04/07/2021 following a paracentesis procedure on 04/06/2021, on diuretics).  She reports his abdomen is larger but he is not having significant discomfort.  She agrees to holding off on the paracentesis until 04/27/2021.  Dr. Benay Spice also recommends follow-up with Dr. Carlean Purl for refractory ascites.  Paracentesis order entered into the computer, referral made for an appointment with Dr. Carlean Purl.

## 2021-04-26 ENCOUNTER — Telehealth: Payer: Self-pay | Admitting: Cardiovascular Disease

## 2021-04-26 NOTE — Telephone Encounter (Signed)
This RN called patient at [phone], call went to voicemail. This RN left non-specific message for patient to call office back.

## 2021-04-26 NOTE — Telephone Encounter (Signed)
Pt c/o BP issue: STAT if pt c/o blurred vision, one-sided weakness or slurred speech  1. What are your last 5 BP readings? Am 135/103 pulse 86 another day 125/96 Pulse 89, 123/95 pulse 88 118/83 pulse 81   2. Are you having any other symptoms (ex. Dizziness, headache, blurred vision, passed out)? No   3. What is your BP issue? Patient just tired.

## 2021-04-27 ENCOUNTER — Ambulatory Visit (HOSPITAL_COMMUNITY)
Admission: RE | Admit: 2021-04-27 | Discharge: 2021-04-27 | Disposition: A | Discharge status: Home / Self Care | Payer: Medicare Other | Source: Ambulatory Visit | Attending: Nurse Practitioner | Admitting: Nurse Practitioner

## 2021-04-27 ENCOUNTER — Other Ambulatory Visit: Payer: Self-pay

## 2021-04-27 DIAGNOSIS — C252 Malignant neoplasm of tail of pancreas: Secondary | ICD-10-CM | POA: Insufficient documentation

## 2021-04-27 DIAGNOSIS — R188 Other ascites: Secondary | ICD-10-CM | POA: Diagnosis not present

## 2021-04-27 DIAGNOSIS — R180 Malignant ascites: Secondary | ICD-10-CM | POA: Diagnosis not present

## 2021-04-27 HISTORY — PX: IR PARACENTESIS: IMG2679

## 2021-04-27 MED ORDER — LIDOCAINE HCL 1 % IJ SOLN
INTRAMUSCULAR | Status: AC
  Filled 2021-04-27: qty 20

## 2021-04-27 MED ORDER — LIDOCAINE HCL (PF) 1 % IJ SOLN
INTRAMUSCULAR | Status: DC | PRN
  Administered 2021-04-27: 10 mL

## 2021-04-27 NOTE — Telephone Encounter (Signed)
Spoke to patient's wife.She stated husband's B/P has been elevated this week.Readings listed below.Stated cancer Dr.stopped Lasix  2 weeks ago.Stated for the past 2 days he has been taking Metoprolol 50 mg daily.B/P at present 126/98 pulse 84.Message sent to Dr.Croitoru for advice.

## 2021-04-27 NOTE — Telephone Encounter (Signed)
Spoke with his wife. He is going for a paracentesis. I suspect his BP will be lower after that. He should not take the metoprolol if BP<120/80.

## 2021-04-27 NOTE — Procedures (Signed)
PROCEDURE SUMMARY:  Successful image-guided paracentesis from the right lateral abdomen.  Yielded 3.0 liters of clear yellow fluid.  No immediate complications.  EBL = 0 mL. Patient tolerated well.   Specimen was not sent for labs.  Please see imaging section of Epic for full dictation.   Claris Pong Arvis Zwahlen PA-C 04/27/2021 1:41 PM

## 2021-04-27 NOTE — Telephone Encounter (Signed)
Follow up:    Patient wife returning call from yesterday. Please advise.  Patient has to leave in a few hours for a apt.

## 2021-05-01 ENCOUNTER — Encounter: Payer: Self-pay | Admitting: Internal Medicine

## 2021-05-01 ENCOUNTER — Ambulatory Visit (INDEPENDENT_AMBULATORY_CARE_PROVIDER_SITE_OTHER): Payer: Medicare Other | Admitting: Internal Medicine

## 2021-05-01 ENCOUNTER — Other Ambulatory Visit (INDEPENDENT_AMBULATORY_CARE_PROVIDER_SITE_OTHER): Payer: Medicare Other

## 2021-05-01 VITALS — BP 110/70 | HR 88 | Ht 64.0 in | Wt 149.1 lb

## 2021-05-01 DIAGNOSIS — C787 Secondary malignant neoplasm of liver and intrahepatic bile duct: Secondary | ICD-10-CM

## 2021-05-01 DIAGNOSIS — R188 Other ascites: Secondary | ICD-10-CM | POA: Diagnosis not present

## 2021-05-01 DIAGNOSIS — C259 Malignant neoplasm of pancreas, unspecified: Secondary | ICD-10-CM | POA: Diagnosis not present

## 2021-05-01 DIAGNOSIS — R031 Nonspecific low blood-pressure reading: Secondary | ICD-10-CM | POA: Diagnosis not present

## 2021-05-01 DIAGNOSIS — L299 Pruritus, unspecified: Secondary | ICD-10-CM | POA: Diagnosis not present

## 2021-05-01 DIAGNOSIS — R55 Syncope and collapse: Secondary | ICD-10-CM | POA: Diagnosis not present

## 2021-05-01 LAB — CBC WITH DIFFERENTIAL/PLATELET
Basophils Absolute: 0 10*3/uL (ref 0.0–0.1)
Basophils Relative: 0.6 % (ref 0.0–3.0)
Eosinophils Absolute: 0.1 10*3/uL (ref 0.0–0.7)
Eosinophils Relative: 0.8 % (ref 0.0–5.0)
HCT: 40.4 % (ref 39.0–52.0)
Hemoglobin: 13.2 g/dL (ref 13.0–17.0)
Lymphocytes Relative: 12.3 % (ref 12.0–46.0)
Lymphs Abs: 0.8 10*3/uL (ref 0.7–4.0)
MCHC: 32.6 g/dL (ref 30.0–36.0)
MCV: 86.7 fl (ref 78.0–100.0)
Monocytes Absolute: 0.6 10*3/uL (ref 0.1–1.0)
Monocytes Relative: 8.5 % (ref 3.0–12.0)
Neutro Abs: 5.3 10*3/uL (ref 1.4–7.7)
Neutrophils Relative %: 77.8 % — ABNORMAL HIGH (ref 43.0–77.0)
Platelets: 191 10*3/uL (ref 150.0–400.0)
RBC: 4.66 Mil/uL (ref 4.22–5.81)
RDW: 15.6 % — ABNORMAL HIGH (ref 11.5–15.5)
WBC: 6.8 10*3/uL (ref 4.0–10.5)

## 2021-05-01 LAB — BASIC METABOLIC PANEL
BUN: 30 mg/dL — ABNORMAL HIGH (ref 6–23)
CO2: 24 mEq/L (ref 19–32)
Calcium: 8.7 mg/dL (ref 8.4–10.5)
Chloride: 103 mEq/L (ref 96–112)
Creatinine, Ser: 1.08 mg/dL (ref 0.40–1.50)
GFR: 62.06 mL/min (ref >60.00)
Glucose, Bld: 120 mg/dL — ABNORMAL HIGH (ref 70–99)
Potassium: 4.3 mEq/L (ref 3.5–5.1)
Sodium: 134 mEq/L — ABNORMAL LOW (ref 135–145)

## 2021-05-01 LAB — PROTIME-INR
INR: 1.2 ratio — ABNORMAL HIGH (ref 0.8–1.0)
Prothrombin Time: 12.9 s (ref 9.6–13.1)

## 2021-05-01 NOTE — Progress Notes (Signed)
Gary [name] 87 y.o. 20-Apr-1932 [id number]  Assessment & Plan:   Encounter Diagnoses  Name Primary?  . Pancreatic cancer metastasized to liver ([place]) Yes  . Other ascites   . Pruritus   . Blood pressure decreased   . Vasovagal syncope?     Orders Placed This Encounter  Procedures  . CBC with Differential/Platelet  . Basic metabolic panel  . Protime-INR    He is relatively stable though he continues to deteriorate some.  The early satiety and anorexia are in part from the ascites and cancer and perhaps medications.  Especially the early satiety can be from ascites.  I think he may have had a vasovagal syncope episode he was on the toilet he had had constipation and then diarrhea that day and felt nauseous and sweaty and then passed out.  He is not really orthostatic here at least not by blood pressure perhaps barely by pulse.  Systolic blood pressure of 100-1 10 low normal.  We want him to run higher.  My additional plan is as follows:   Review labs then consider change in medications which would include adding back furosemide to try to improve ascites  Also consider the addition of albumin infusion after his paracenteses hopefully using his port question if this could be done in the cancer center infusion center   Return to see me in about 6 weeks  Consider reducing blood pressure medications as well coordinating with primary care and/or cardiology    Subjective:   Chief Complaint: Ascites  HPI 87 year old [name] man with difficult to manage ascites in the setting of adenocarcinoma the pancreatic tail and liver metastases.  Last seen in March 2022 with ascites that I thought was malignant but then after the visit I realized he had never had analysis of his fluid, we had that done and it was consistent with portal hypertension and not malignant ascites.  Treated with spironolactone and furosemide after that.  Furosemide was stopped after he fell.  He was  defecating on the toilet and was nauseous and sweaty and passed out.  He was not standing.  He denies symptoms of orthostasis.  He is taking his time when he stands.  He is complaining of early satiety some mild left-sided abdominal discomfort and pruritus still just on the left side but overall the pruritus is substantially improved since he started Dupixent.    Wt Readings from Last 3 Encounters:  05/01/21 149 lb 2 oz (67.6 kg)  04/19/21 147 lb 6.4 oz (66.9 kg)  04/11/21 143 lb 12.8 oz (65.2 kg)   Lab Results  Component Value Date   CREATININE 1.07 04/11/2021   BUN 41 (H) 04/11/2021   NA 136 04/11/2021   K 3.7 04/11/2021   CL 104 04/11/2021   CO2 23 04/11/2021     He fell after a 4 L paracentesis, and was seen in the emergency room on 423.  I reviewed those notes. IMPRESSION: 1. No acute intracranial findings. 2. Chronic microvascular ischemic changes and cerebral volume loss. 3. No evidence of acute fracture or traumatic listhesis of the cervical spine. 4. Mild multilevel degenerative disc disease and facet arthropathy of the cervical spine. IMPRESSION: 1. Numerous low-attenuation lesions throughout the liver. Some lesions are slightly decreased in size, other lesions may be slightly increased in size. Although degree of change is generally subtle, findings suggest mixed response to treatment of hepatic metastatic disease. 2. Previously noted pancreatic tail mass is not discretely appreciated on this examination.  3. Large volume ascites throughout the abdomen and pelvis, not significantly changed compared to prior examination. 4. Cholelithiasis. 5. Colonic diverticulosis. 6. Hiatal hernia.  Aortic Atherosclerosis (ICD10-I70.0).   Electronically Signed   By: Eddie Candle M.D.   On: 04/13/2021 18:32  CA 19-9 was 2314 3 months ago 692 2 months ago 397 1 month ago and 366 2 weeks ago.   Allergies  Allergen Reactions  . Gentamycin [Gentamicin Sulfate]      "Inner ears problems after Gent infusion" , also "unsteady mobility"  . Hydrocodone   . Aspirin Other (See Comments)    High doses cause stomach upset   Current Meds  Medication Sig  . DUPIXENT 300 MG/2ML SOPN Inject 300 mg into the skin every 21 ( twenty-one) days.  . metoprolol succinate (TOPROL-XL) 50 MG 24 hr tablet Take 0.5 tablets (25 mg total) by mouth daily. Take with or immediately following a meal. (Patient taking differently: Take 25 mg by mouth daily. Take with or immediately following a meal. Do not take for a blood pressure less than 120/80)  . omeprazole (PRILOSEC) 20 MG capsule Take 1-2 capsules by mouth daily.  Marland Kitchen spironolactone (ALDACTONE) 50 MG tablet Take 1 tablet (50 mg total) by mouth daily.   Past Medical History:  Diagnosis Date  . Ascending aortic aneurysm ([place]) 01/13/2014   stable at 4.7 cm diameter since 2009  . Asystole x2, s/p pacemaker 01/13/2014  . Bradycardia    permanent pacemaker 11/22/08 [employer]Jude  . Family history of adverse reaction to anesthesia    brother died after anesthesia / nose polyp removal at [facility] (40 years ago)  . HTN (hypertension) 01/13/2014  . Hyperlipidemia   . Hypertension   . Malignant ascites   . Pacemaker - leads 1987, last generator 2009 [employer] Jude dual-chamber 01/13/2014   Atrial lead model 433-01, ventricular lead model 431-01 implanted November 1987 Right ventricular lead dysfunction with low impedance and mediocre sensing but satisfactory pacing threshold  . Pacemaker lead malfunction 01/13/2014   Chronic low impedance and poor sensing of the ventricular lead  . Pancreatic cancer [facility])    Metastatic to liver   Past Surgical History:  Procedure Laterality Date  . IR IMAGING GUIDED PORT INSERTION  05/24/2020  . IR PARACENTESIS  10/26/2020  . IR PARACENTESIS  12/12/2020  . IR PARACENTESIS  01/22/2021  . IR PARACENTESIS  02/02/2021  . IR PARACENTESIS  02/14/2021  . IR PARACENTESIS  02/23/2021  . IR PARACENTESIS  03/06/2021  . IR  PARACENTESIS  03/14/2021  . IR PARACENTESIS  03/22/2021  . IR PARACENTESIS  03/29/2021  . IR PARACENTESIS  04/06/2021  . IR PARACENTESIS  04/17/2021  . IR PARACENTESIS  04/27/2021  . PERMANENT PACEMAKER GENERATOR CHANGE  11/22/08   [employer] Jude  . PERMANENT PACEMAKER INSERTION  03/04/97   [employer] Jude  . US ECHOCARDIOGRAPHY  05/16/11   AS mild to mod.,TR mild to mod., EF => 55%.   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   Review of Systems As per HPI  Objective:   Physical Exam BP 110/70 (BP Location: Left Arm, Patient Position: Sitting, Cuff Size: Normal)   Pulse 88   Ht 5\' 4"  (1.626 m) Comment: height measured without shoes  Wt  149 lb 2 oz (67.6 kg)   BMI 25.60 kg/m  Orthostatic vital signs take supine pulse is 88 blood pressure 100/80.  Sitting blood pressure same pulse 92 standing blood pressure 110/80 pulse 96 Eyes anicteric he is a frail chronically ill elderly man Lungs are clear Heart sounds are normal S1-S2 no rubs or gallops The abdomen is protuberant and distended with ascites there are some prominent veins on the abdominal wall is nontender Skin is scaly and some areas mild particularly where he itches in the left flank   42 minutes total time

## 2021-05-01 NOTE — Patient Instructions (Signed)

## 2021-05-02 ENCOUNTER — Other Ambulatory Visit: Payer: Self-pay | Admitting: Internal Medicine

## 2021-05-02 DIAGNOSIS — L209 Atopic dermatitis, unspecified: Secondary | ICD-10-CM | POA: Diagnosis not present

## 2021-05-07 ENCOUNTER — Other Ambulatory Visit: Payer: Self-pay

## 2021-05-07 ENCOUNTER — Telehealth: Payer: Self-pay | Admitting: *Deleted

## 2021-05-07 ENCOUNTER — Telehealth: Payer: Self-pay | Admitting: Internal Medicine

## 2021-05-07 DIAGNOSIS — R188 Other ascites: Secondary | ICD-10-CM

## 2021-05-07 NOTE — Telephone Encounter (Signed)
Daughter left message requesting paracentesis for her father on 5/25 after 1 pm at [facility].

## 2021-05-07 NOTE — Telephone Encounter (Signed)
Pt's daughter Gary [name] called and stated that her father's stomach is filled with fluid and would like to sch an appt as soon as possible at [facility]. Please give her a call. Thank you

## 2021-05-07 NOTE — Telephone Encounter (Signed)
Patient daughter returned call. I confirmed the appointment with her, she stated it was great.

## 2021-05-07 NOTE — Telephone Encounter (Signed)
Per Dr. Benay Spice: Dr. Carlean Purl has taken over management of his ascites. Staff message sent to Dr. Celesta Aver nurse w/daughter's request.

## 2021-05-07 NOTE — Telephone Encounter (Signed)
Patient has been scheduled for paracentesis for 05/09/21 1:00 at [facility].  Patient's daughter Bethena Roys

## 2021-05-09 ENCOUNTER — Ambulatory Visit (HOSPITAL_COMMUNITY)
Admission: RE | Admit: 2021-05-09 | Discharge: 2021-05-09 | Disposition: A | Discharge status: Home / Self Care | Payer: Medicare Other | Source: Ambulatory Visit | Attending: Internal Medicine | Admitting: Internal Medicine

## 2021-05-09 ENCOUNTER — Other Ambulatory Visit: Payer: Self-pay

## 2021-05-09 DIAGNOSIS — R188 Other ascites: Secondary | ICD-10-CM | POA: Diagnosis not present

## 2021-05-09 DIAGNOSIS — C259 Malignant neoplasm of pancreas, unspecified: Secondary | ICD-10-CM | POA: Diagnosis not present

## 2021-05-09 HISTORY — PX: IR PARACENTESIS: IMG2679

## 2021-05-09 MED ORDER — LIDOCAINE HCL (PF) 1 % IJ SOLN
INTRAMUSCULAR | Status: AC
  Filled 2021-05-09: qty 30

## 2021-05-09 NOTE — Procedures (Signed)
PROCEDURE SUMMARY:  Successful image-guided paracentesis from the right lower abdomen.  Yielded 3 liters of clear yellow  fluid.  No immediate complications.  EBL = trace. Patient tolerated well.   Specimen was not sent for labs.  Please see imaging section of Epic for full dictation.   Armando Gang Dahlia Nifong PA-C 05/09/2021 1:56 PM

## 2021-05-11 ENCOUNTER — Inpatient Hospital Stay (HOSPITAL_BASED_OUTPATIENT_CLINIC_OR_DEPARTMENT_OTHER): Payer: Medicare Other | Admitting: Oncology

## 2021-05-11 ENCOUNTER — Inpatient Hospital Stay: Payer: Medicare Other

## 2021-05-11 ENCOUNTER — Other Ambulatory Visit: Payer: Self-pay

## 2021-05-11 VITALS — BP 126/84 | HR 99 | Temp 97.7°F | Resp 18 | Ht 64.0 in | Wt 143.6 lb

## 2021-05-11 DIAGNOSIS — R188 Other ascites: Secondary | ICD-10-CM | POA: Diagnosis not present

## 2021-05-11 DIAGNOSIS — C787 Secondary malignant neoplasm of liver and intrahepatic bile duct: Secondary | ICD-10-CM | POA: Diagnosis not present

## 2021-05-11 DIAGNOSIS — C252 Malignant neoplasm of tail of pancreas: Secondary | ICD-10-CM | POA: Diagnosis not present

## 2021-05-11 DIAGNOSIS — Z950 Presence of cardiac pacemaker: Secondary | ICD-10-CM | POA: Diagnosis not present

## 2021-05-11 DIAGNOSIS — Z95828 Presence of other vascular implants and grafts: Secondary | ICD-10-CM

## 2021-05-11 DIAGNOSIS — L89159 Pressure ulcer of sacral region, unspecified stage: Secondary | ICD-10-CM | POA: Diagnosis not present

## 2021-05-11 DIAGNOSIS — I10 Essential (primary) hypertension: Secondary | ICD-10-CM | POA: Diagnosis not present

## 2021-05-11 MED ORDER — SODIUM CHLORIDE 0.9% FLUSH
10.0000 mL | INTRAVENOUS | Status: DC | PRN
  Administered 2021-05-11: 10 mL
  Filled 2021-05-11: qty 10

## 2021-05-11 MED ORDER — HEPARIN SOD (PORK) LOCK FLUSH 100 UNIT/ML IV SOLN
500.0000 [IU] | Freq: Once | INTRAVENOUS | Status: AC | PRN
  Administered 2021-05-11: 500 [IU]
  Filled 2021-05-11: qty 5

## 2021-05-11 NOTE — Progress Notes (Signed)
[place] OFFICE PROGRESS NOTE   Diagnosis: Pancreas cancer  INTERVAL HISTORY:   Gary [name] returns as scheduled.  He is here with his son and wife.  He last underwent a paracentesis for 3 L on 03/09/2021.  He is taking Lasix and Aldactone.  No recurrent falls.  He reports "hemorrhoids ".  He is using Preparation H.  Objective:  Vital signs in last 24 hours:  Blood pressure 126/84, pulse 99, temperature 97.7 F (36.5 C), temperature source Oral, resp. rate 18, height 5\' 4"  (1.626 m), weight 143 lb 9.6 oz (65.1 kg), SpO2 98 %.    Resp: Lungs clear bilaterally Cardio: Regular rate and rhythm GI: Mildly distended, no mass, no hepatosplenomegaly Vascular: No leg edema Rectal: Soft with small external hemorrhoids, superficial pressure ulcer at the sacrum  Portacath/PICC-without erythema  Lab Results:  Lab Results  Component Value Date   WBC 6.8 05/01/2021   HGB 13.2 05/01/2021   HCT 40.4 05/01/2021   MCV 86.7 05/01/2021   PLT 191.0 05/01/2021   NEUTROABS 5.3 05/01/2021    CMP  Lab Results  Component Value Date   NA 134 (L) 05/01/2021   K 4.3 05/01/2021   CL 103 05/01/2021   CO2 24 05/01/2021   GLUCOSE 120 (H) 05/01/2021   BUN 30 (H) 05/01/2021   CREATININE 1.08 05/01/2021   CALCIUM 8.7 05/01/2021   PROT 6.1 (L) 04/11/2021   ALBUMIN 2.8 (L) 04/11/2021   AST 21 04/11/2021   ALT 12 04/11/2021   ALKPHOS 108 04/11/2021   BILITOT 0.7 04/11/2021   GFRNONAA >60 04/11/2021   GFRAA >60 09/05/2020      Imaging:  IR Paracentesis  Result Date: 05/09/2021 INDICATION: History of metastatic pancreatic cancer with recurrent ascites. Request for therapeutic paracentesis. EXAM: ULTRASOUND GUIDED  PARACENTESIS MEDICATIONS: 10 mL 1% lidocaine COMPLICATIONS: None immediate. PROCEDURE: Informed written consent was obtained from the patient after a discussion of the risks, benefits and alternatives to treatment. A timeout was performed prior to the initiation of  the procedure. Initial ultrasound scanning demonstrates a large amount of ascites within the right lower abdominal quadrant. The right lower abdomen was prepped and draped in the usual sterile fashion. 1% lidocaine was used for local anesthesia. Following this, a 19 gauge, 10-cm, Yueh catheter was introduced. An ultrasound image was saved for documentation purposes. The paracentesis was performed. The catheter was removed and a dressing was applied. The patient tolerated the procedure well without immediate post procedural complication. FINDINGS: A total of approximately 3 L of clear yellow fluid was removed. IMPRESSION: Successful ultrasound-guided paracentesis yielding 3 liters of peritoneal fluid. Read by: Durenda Guthrie, PA-C Electronically Signed   By: Corrie Mckusick D.O.   On: 05/09/2021 13:56    Medications: I have reviewed the patient's current medications.   Assessment/Plan: 1. Pancreas tail lesion, multiple liver lesions   Chest CT 04/12/2020-ascending thoracic aortic aneurysm; hypovascular lesion tail of the pancreas, new compared to the prior examination. Multiple poorly defined hypovascular lesions scattered throughout the liver   CT abdomen/pelvis 05/02/2020-hypoenhancing lesion of the pancreatic tail measuring approximately 2.4 x 1.9 cm; enlarged portacaval lymph nodes measuring up to 1.9 x 1.4 cm; numerous hypodense lesions throughout the liver, index lesion of the liver dome measuring 1.7 x 1.2 cm.  Ultrasound-guided biopsy of a left liver lesion 05/10/2020-poorly differentiated carcinoma consistent with a pancreatobiliary primary  Markedly elevated CA 19-9  Cycle 1day 1gemcitabine/Abraxane 05/26/2020  Cycle1 day 15gemcitabine/Abraxane 06/12/2020  Cycle2 day 1gemcitabine/Abraxane 07/04/2020(chemotherapy doses  reduced, antiemetic regimen adjusted)  Cycle2 day 15Gemcitabine/Abraxane 07/18/2020(continue same dose reduction, reduce post-treatment steroids due to insomnia)  Cycle 3  day 1 gem/abraxane 08/02/20  Cycle 3 day 15 gem/Abraxane 08/15/2020  CTs abdomen/pelvis 08/25/2020-pancreas tail mass and portacaval lymphadenopathy decreased. Liver metastases reported as increased, further review with no significant change in the majority of liver lesions and a decrease in the size of a few lesions, no new margin lesions  Cycle 4-day 1 gemcitabine/Abraxane 09/05/2020  Paracentesis 09/26/2020-reactive mesothelial cells  Paracentesis 10/09/2020-reactive mesothelial cells  Cycle 5 gemcitabine/Abraxane 10/12/2020  Cycle 6 gemcitabine/Abraxane 11/01/2020  Cycle 7 gemcitabine/Abraxane 11/22/2020  Cycle 8 gemcitabine/Abraxane 12/13/2020  Cycle 9 gemcitabine/Abraxane 01/03/2021  CT abdomen/pelvis 01/29/2021-decrease in pancreas tail mass, decrease in size of multiple hepatic metastases, decreased size of hepatoduodenal ligament lymph nodes, progressive ascites  Cycle 10 gemcitabine/Abraxane 01/31/2021  CT abdomen/pelvis 04/13/2021- generally stable liver lesions, some have slightly decreased and others slightly increased compared to the CT 01/29/2021, pancreas mass not appreciated, large volume ascites  2. Thoracic aortic aneurysm 3. Hypertension 4. Hyperlipidemia 5. History of bradycardia status post permanent pacemaker 6. Mother hadcervicalcancer 7. Pitting lower leg edema 8. GERD, on omeprazole 9. Right upper back inflamed sebaceous cyst-culture from cyst drainage 10/23/2020-2 staph species, antibiotic changed to ciprofloxacin 10/27/2020, status post I&D procedure 11/17/2020 10. Refractory ascites-trial of spironolactone 03/23/2021; spironolactone and Lasix 04/04/2021      Disposition: Gary [name] appears unchanged.  He will continue follow-up with Dr. Carlean Purl for management of ascites.  He remains off of specific therapy for pancreas cancer.  He has small external hemorrhoids.  He will continue Preparation H and try sitz bath's.  He has an early decubitus ulcer.  I  recommended he decrease weightbearing on the sacrum.  He will return for an office visit in 5-6 weeks.  Betsy Coder, MD  05/11/2021  12:58 PM

## 2021-05-16 DIAGNOSIS — L209 Atopic dermatitis, unspecified: Secondary | ICD-10-CM | POA: Diagnosis not present

## 2021-05-17 NOTE — Telephone Encounter (Signed)
How often can he have a paracentesis?  Can I place standing orders?

## 2021-05-17 NOTE — Telephone Encounter (Signed)
Inbound call from patient daughter, Bethena Roys. States patient stomach is full of fluid and would like a paracentesis on 05/22/21 at 1:30pm or later if possible at [facility]. Best contact number [phone]

## 2021-05-18 ENCOUNTER — Ambulatory Visit: Payer: Medicare Other | Admitting: Nurse Practitioner

## 2021-05-18 ENCOUNTER — Other Ambulatory Visit: Payer: Self-pay

## 2021-05-18 DIAGNOSIS — R188 Other ascites: Secondary | ICD-10-CM

## 2021-05-18 NOTE — Telephone Encounter (Signed)
I spoke with the patient's daughter and she is aware of the appointment for 05/22/21 2:00.  She is advised of the standing orders and that they can schedule his weekly paracentesis when he is there on 05/22/21.

## 2021-05-18 NOTE — Telephone Encounter (Signed)
Weekly is fine   Standing orders ok  Max 5 L

## 2021-05-22 ENCOUNTER — Ambulatory Visit (HOSPITAL_COMMUNITY)
Admission: RE | Admit: 2021-05-22 | Discharge: 2021-05-22 | Disposition: A | Discharge status: Home / Self Care | Payer: Medicare Other | Source: Ambulatory Visit | Attending: Internal Medicine | Admitting: Internal Medicine

## 2021-05-22 ENCOUNTER — Other Ambulatory Visit: Payer: Self-pay

## 2021-05-22 DIAGNOSIS — R188 Other ascites: Secondary | ICD-10-CM | POA: Insufficient documentation

## 2021-05-22 HISTORY — PX: IR PARACENTESIS: IMG2679

## 2021-05-22 MED ORDER — ALBUMIN HUMAN 25 % IV SOLN
25.0000 g | INTRAVENOUS | Status: DC
  Filled 2021-05-22: qty 100

## 2021-05-22 MED ORDER — LIDOCAINE HCL (PF) 1 % IJ SOLN
INTRAMUSCULAR | Status: AC
  Filled 2021-05-22: qty 30

## 2021-05-22 MED ORDER — ALBUMIN HUMAN 25 % IV SOLN
INTRAVENOUS | Status: AC
  Administered 2021-05-22: 25 g via INTRAVENOUS
  Filled 2021-05-22: qty 100

## 2021-05-22 MED ORDER — LIDOCAINE HCL 1 % IJ SOLN
INTRAMUSCULAR | Status: DC | PRN
  Administered 2021-05-22: 10 mL

## 2021-05-22 NOTE — Procedures (Signed)
Ultrasound-guided therapeutic paracentesis performed yielding 4.1 liters of straw colored fluid. No immediate complications. EBL is none.

## 2021-05-23 ENCOUNTER — Telehealth: Payer: Self-pay | Admitting: Internal Medicine

## 2021-05-23 NOTE — Telephone Encounter (Signed)
Inbound call from patient's daughter stating he went to his paracentesis yesterday where they took out 4L which the daughter feels was too much.  States the patient was not able to sleep last night and is very weak today, also groggy.  Please advise.

## 2021-05-23 NOTE — Telephone Encounter (Signed)
Dr. Carlean Purl, see note from daughter.  She wants the max volume removed on paracentesis to be 3 liters.  Currently you have at 5.  Please advise. Daughter is aware that you are out of the office until next week.

## 2021-05-24 DIAGNOSIS — H838X3 Other specified diseases of inner ear, bilateral: Secondary | ICD-10-CM | POA: Diagnosis not present

## 2021-05-24 DIAGNOSIS — H6123 Impacted cerumen, bilateral: Secondary | ICD-10-CM | POA: Diagnosis not present

## 2021-05-24 DIAGNOSIS — H903 Sensorineural hearing loss, bilateral: Secondary | ICD-10-CM | POA: Diagnosis not present

## 2021-05-24 NOTE — Telephone Encounter (Signed)
See phone note from 05/24/21.  New Max liter removed is 3 liters.  Orders updated.

## 2021-05-24 NOTE — Addendum Note (Signed)
Addended by: Marlon Pel on: 05/24/2021 12:24 PM   Modules accepted: Orders

## 2021-05-24 NOTE — Telephone Encounter (Signed)
All orders updated.  Family members updated. Patient reports that he is having pain in his back, this is not the same side that he had the paracentesis performed.  They are asked to discuss with his PCP

## 2021-05-24 NOTE — Telephone Encounter (Signed)
We can make new max 3L

## 2021-05-31 DIAGNOSIS — L209 Atopic dermatitis, unspecified: Secondary | ICD-10-CM | POA: Diagnosis not present

## 2021-06-03 ENCOUNTER — Other Ambulatory Visit: Payer: Self-pay | Admitting: Oncology

## 2021-06-08 ENCOUNTER — Telehealth: Payer: Self-pay | Admitting: Internal Medicine

## 2021-06-08 NOTE — Telephone Encounter (Signed)
Communication regarding weights and blood pressure and pulse.  Information texted to me.  Plan is to increase furosemide to 30 mg daily and spironolactone to 75 mg daily.

## 2021-06-11 IMAGING — US US PARACENTESIS
1 series · 6 of 6 positions shown · non-contrast
Comparison: none

INDICATION: Patient with history of pancreatic cancer, ascites. Request made for
diagnostic and therapeutic paracentesis.

[Series 1: us paracentesis · 6 of 6 slices shown]
[im 1/6]
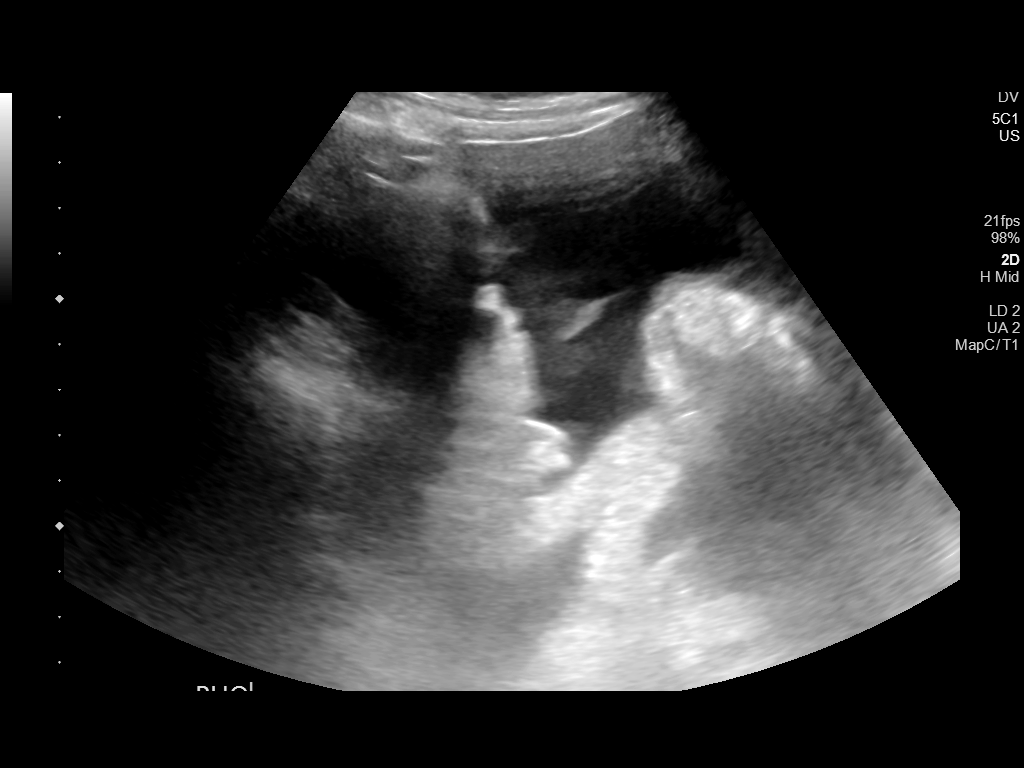
[im 2/6]
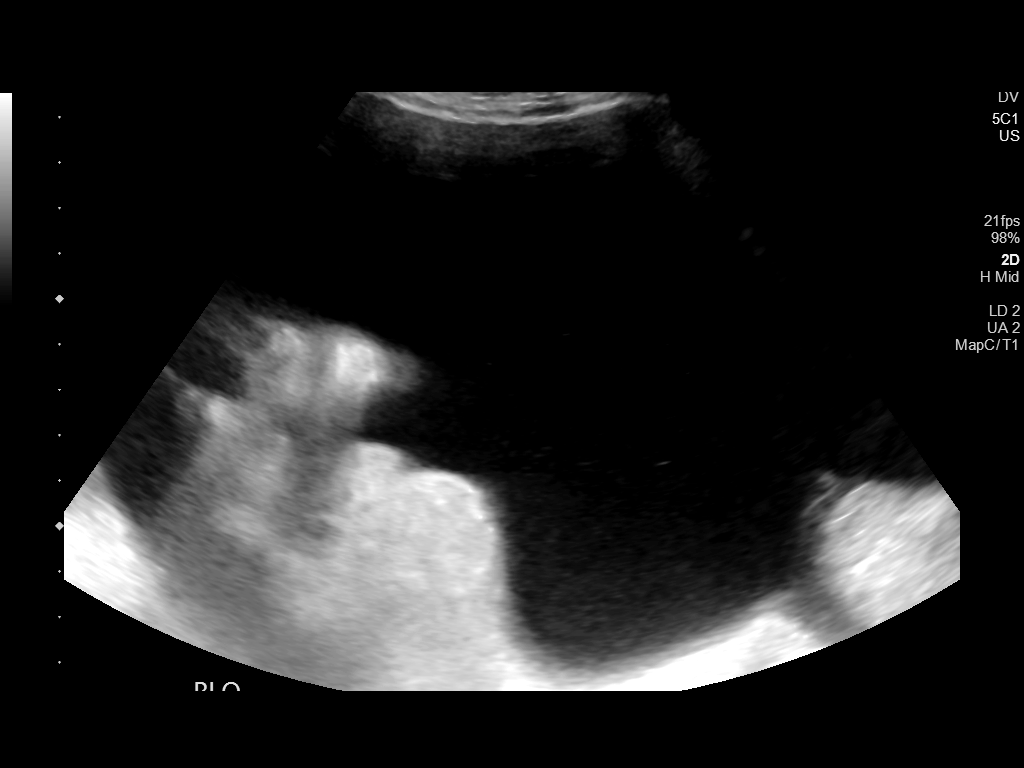
[im 3/6]
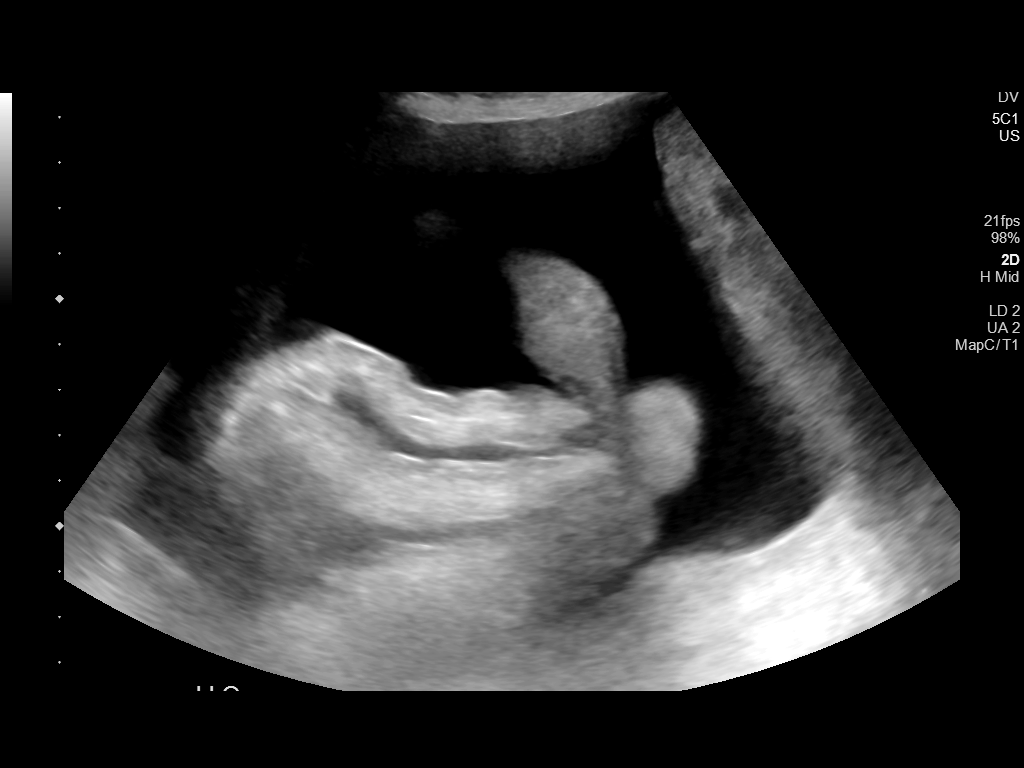
[im 4/6]
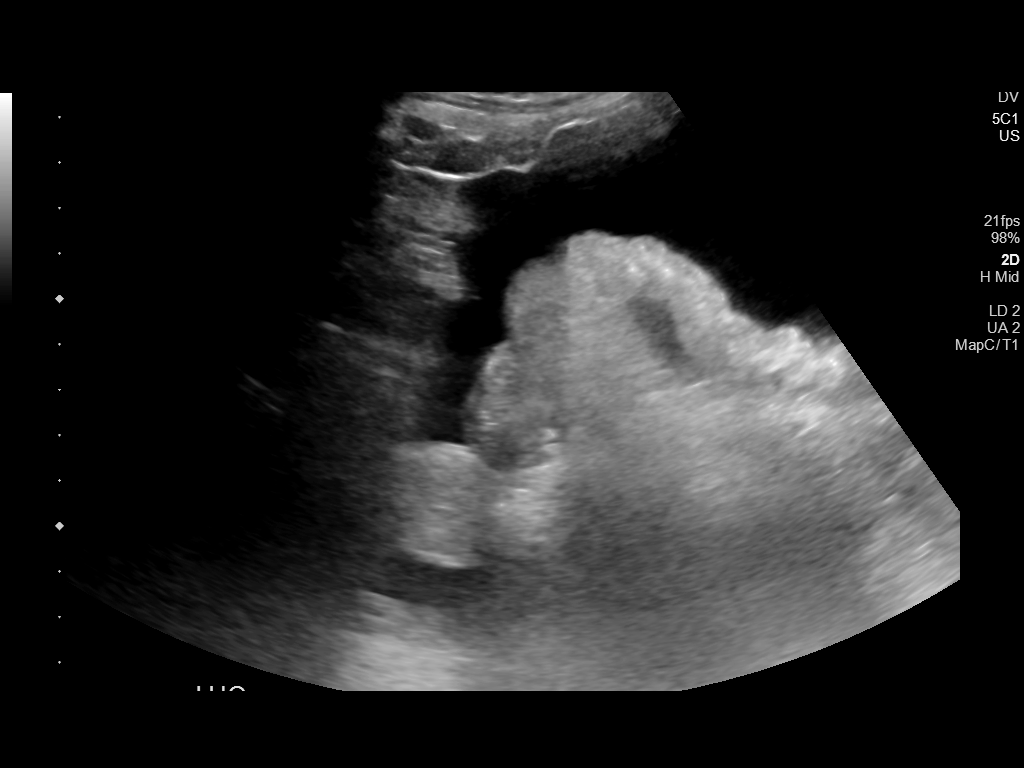
[im 5/6]
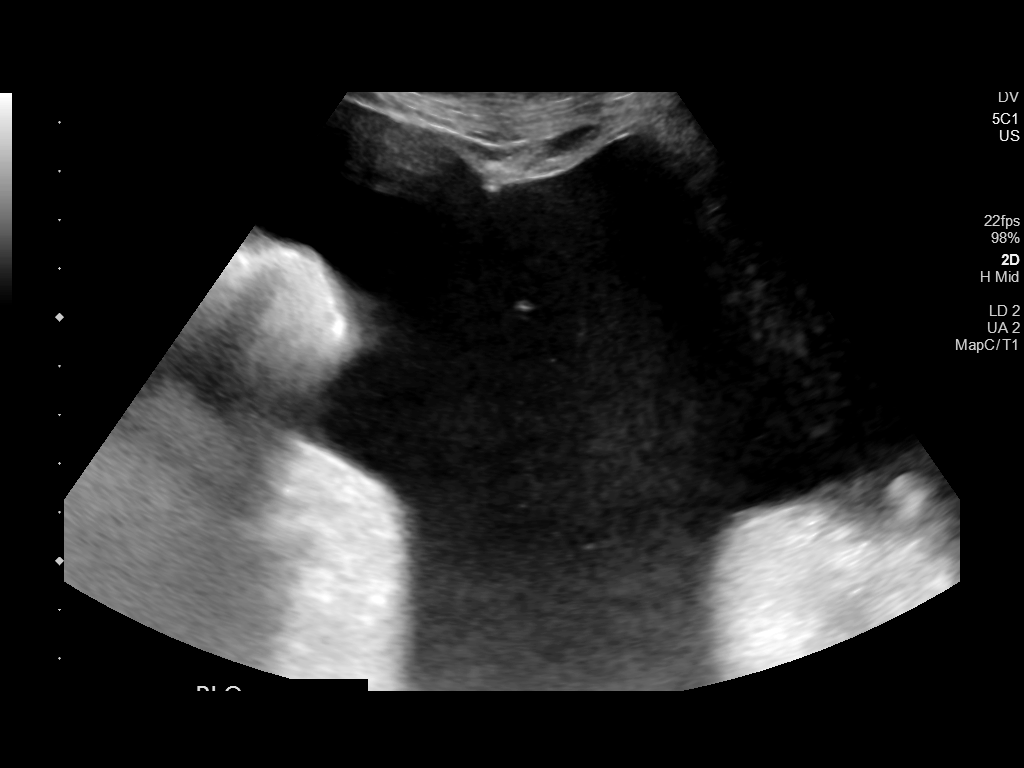
[im 6/6]
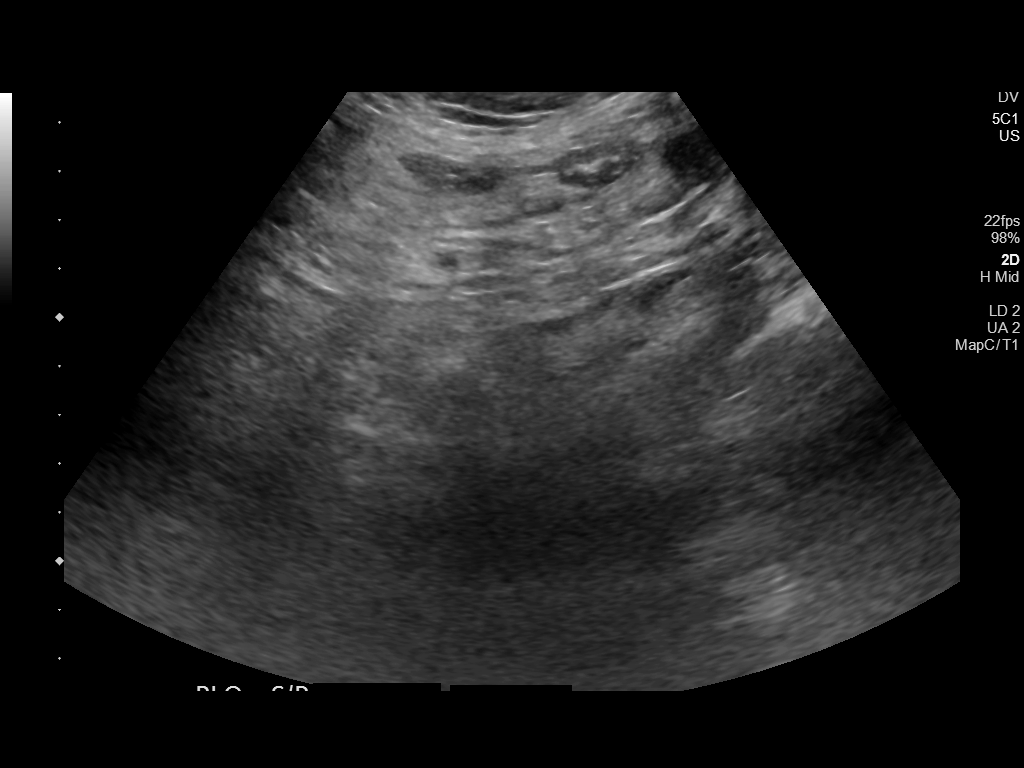

[6 of 6 positions shown; findings below may reference images not displayed]

EXAM:
ULTRASOUND GUIDED DIAGNOSTIC AND THERAPEUTIC PARACENTESIS

MEDICATIONS:
1% lidocaine to skin and subcutaneous tissue

COMPLICATIONS:
None immediate.

PROCEDURE:
Informed written consent was obtained from the patient after a
discussion of the risks, benefits and alternatives to treatment. A
timeout was performed prior to the initiation of the procedure.

Initial ultrasound scanning demonstrates a large amount of ascites
within the right lower abdominal quadrant. The right lower abdomen
was prepped and draped in the usual sterile fashion. 1% lidocaine
was used for local anesthesia.

Following this, a 19 gauge, 10-cm, Yueh catheter was introduced. An
ultrasound image was saved for documentation purposes. The
paracentesis was performed. The catheter was removed and a dressing
was applied. The patient tolerated the procedure well without
immediate post procedural complication.
FINDINGS: A total of approximately 4.5 liters of yellow fluid was removed.
Samples were sent to the laboratory as requested by the clinical
team.
IMPRESSION: Successful ultrasound-guided diagnostic and therapeutic paracentesis
yielding 4.5 liters of peritoneal fluid.

## 2021-06-12 ENCOUNTER — Ambulatory Visit (INDEPENDENT_AMBULATORY_CARE_PROVIDER_SITE_OTHER): Payer: Medicare Other | Admitting: Internal Medicine

## 2021-06-12 ENCOUNTER — Encounter: Payer: Self-pay | Admitting: Internal Medicine

## 2021-06-12 VITALS — BP 100/58 | HR 88 | Wt 136.0 lb

## 2021-06-12 DIAGNOSIS — C259 Malignant neoplasm of pancreas, unspecified: Secondary | ICD-10-CM | POA: Diagnosis not present

## 2021-06-12 DIAGNOSIS — C787 Secondary malignant neoplasm of liver and intrahepatic bile duct: Secondary | ICD-10-CM

## 2021-06-12 DIAGNOSIS — R188 Other ascites: Secondary | ICD-10-CM

## 2021-06-12 DIAGNOSIS — R5381 Other malaise: Secondary | ICD-10-CM | POA: Diagnosis not present

## 2021-06-12 DIAGNOSIS — L609 Nail disorder, unspecified: Secondary | ICD-10-CM | POA: Diagnosis not present

## 2021-06-12 MED ORDER — SPIRONOLACTONE 25 MG PO TABS: ORAL_TABLET | ORAL | 3 refills | Status: DC

## 2021-06-12 MED ORDER — FUROSEMIDE 20 MG PO TABS: ORAL_TABLET | ORAL | 3 refills | Status: DC

## 2021-06-12 NOTE — Progress Notes (Addendum)
Gary [name] 87 y.o. 01-May-1932 [id number]  Assessment & Plan:   Encounter Diagnoses  Name Primary?   Other ascites Yes   Pancreatic cancer metastasized to liver [facility])    Physical deconditioning    Fingernail abnormalities      He is improving on the new diuretic regimen and we will continue that with the following modification and that we will alternate the higher doses.  That way he may not lose too much weight and fluid and hopefully we can avoid paracenteses.  I am not sure what the issues are with his fingernails.  I have asked him to show to Ned Card or Dr. Benay Spice.  07/31/2021 is the patient's next visit with me   I appreciate the opportunity to care for this patient. CC: [name], MD Dr. Olivia Canter, NP  Subjective:   Chief Complaint: Follow-up of ascites  HPI The patient is here with his wife and son.  He reports that he is weak and is wondering about having physical therapy again.  In the past he had somebody who he thought was not that effective.  He has not needed a paracentesis since June 7.  I have increased his diuretics after reviewing blood pressures and weights.  Wt Readings from Last 3 Encounters:  06/12/21 136 lb (61.7 kg)  05/11/21 143 lb 9.6 oz (65.1 kg)  05/01/21 149 lb 2 oz (67.6 kg)   Weight on June 23 at home was 141.8 and then on June 27 138.7.  This was after I increased his diuretics from 20 mg furosemide to 30 mg and from 50 mg spironolactone to 75 mg.  Blood pressures have been normotensive with the lowest in the 1 teens over 80s  He is concerned about some fingernail abnormalities and their significance.  He thinks some of his fingernails are going to fall off.  There is no pain. Allergies  Allergen Reactions   Amlodipine Other (See Comments)   Gentamycin [Gentamicin Sulfate]     "Inner ears problems after Gent infusion" , also "unsteady mobility"   Hydrocodone    Aspirin Other (See Comments)    High doses  cause stomach upset   Current Meds  Medication Sig   DUPIXENT 300 MG/2ML SOPN Inject 300 mg into the skin every 21 ( twenty-one) days.   furosemide (LASIX) 20 MG tablet 20 mg (1) alternating with 30 mg (1.5) daily every other day   omeprazole (PRILOSEC) 20 MG capsule Take 1-2 capsules by mouth daily.   spironolactone (ALDACTONE) 25 MG tablet 50 mg (2 pills) alternating with 75 mg (3 pills) every other day   [DISCONTINUED] furosemide (LASIX) 20 MG tablet Take 20 mg by mouth daily. Take 1.5 tablets daily   [DISCONTINUED] spironolactone (ALDACTONE) 50 MG tablet Take 50 mg by mouth daily. Take 1.5 tablets daily   Past Medical History:  Diagnosis Date   Ascending aortic aneurysm ([place]) 01/13/2014   stable at 4.7 cm diameter since 2009   Asystole x2, s/p pacemaker 01/13/2014   Bradycardia    permanent pacemaker 11/22/08 [employer]   Family history of adverse reaction to anesthesia    brother died after anesthesia / nose polyp removal at [facility] (40 years ago)   HTN (hypertension) 01/13/2014   Hyperlipidemia    Hypertension    Malignant ascites    Pacemaker - leads 1987, last generator 2009 [employer] dual-chamber 01/13/2014   Atrial lead model 433-01, ventricular lead model 431-01 implanted November 1987 Right ventricular lead  dysfunction with low impedance and mediocre sensing but satisfactory pacing threshold   Pacemaker lead malfunction 01/13/2014   Chronic low impedance and poor sensing of the ventricular lead   Pancreatic cancer ([place])    Metastatic to liver   Past Surgical History:  Procedure Laterality Date   IR IMAGING GUIDED PORT INSERTION  05/24/2020   IR PARACENTESIS  10/26/2020   IR PARACENTESIS  12/12/2020   IR PARACENTESIS  01/22/2021   IR PARACENTESIS  02/02/2021   IR PARACENTESIS  02/14/2021   IR PARACENTESIS  02/23/2021   IR PARACENTESIS  03/06/2021   IR PARACENTESIS  03/14/2021   IR PARACENTESIS  03/22/2021   IR PARACENTESIS  03/29/2021   IR PARACENTESIS  04/06/2021   IR PARACENTESIS   04/17/2021   IR PARACENTESIS  04/27/2021   IR PARACENTESIS  05/09/2021   IR PARACENTESIS  05/22/2021   PERMANENT PACEMAKER GENERATOR CHANGE  11/22/08   [employer]   PERMANENT PACEMAKER INSERTION  03/04/97   [employer]   US ECHOCARDIOGRAPHY  05/16/11   AS mild to mod.,TR mild to mod., EF => 55%.   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   Review of Systems As above  Objective:   Physical Exam BP (!) 100/58   Pulse 88   Wt 136 lb (61.7 kg)   SpO2 98%   BMI 23.34 kg/m  Elderly man Lungs cta'cor ok Ascites 1+ edema  Some of his fingernails are dystrophic though not all.  I struggle to describe but it seems like there was some stoppage of growth and then regrowth.  None of the levels looked dead but they are a bit unusual.  They are not tender.

## 2021-06-12 NOTE — Patient Instructions (Signed)
We have sent medications to your pharmacy for you to pick up at your convenience.  Ask Dr Benay Spice about physical therapy.  I appreciate the opportunity to care for you. Silvano Rusk, MD, [facility]

## 2021-06-21 ENCOUNTER — Inpatient Hospital Stay: Payer: Medicare Other

## 2021-06-21 ENCOUNTER — Encounter: Payer: Self-pay | Admitting: Nurse Practitioner

## 2021-06-21 ENCOUNTER — Inpatient Hospital Stay (HOSPITAL_BASED_OUTPATIENT_CLINIC_OR_DEPARTMENT_OTHER): Payer: Medicare Other | Admitting: Nurse Practitioner

## 2021-06-21 ENCOUNTER — Inpatient Hospital Stay: Payer: Medicare Other | Attending: Oncology

## 2021-06-21 ENCOUNTER — Other Ambulatory Visit: Payer: Self-pay

## 2021-06-21 VITALS — BP 133/87 | HR 88 | Temp 97.7°F | Resp 20 | Ht 64.0 in | Wt 139.0 lb

## 2021-06-21 DIAGNOSIS — I712 Thoracic aortic aneurysm, without rupture: Secondary | ICD-10-CM | POA: Insufficient documentation

## 2021-06-21 DIAGNOSIS — R188 Other ascites: Secondary | ICD-10-CM | POA: Insufficient documentation

## 2021-06-21 DIAGNOSIS — C252 Malignant neoplasm of tail of pancreas: Secondary | ICD-10-CM | POA: Diagnosis not present

## 2021-06-21 DIAGNOSIS — C787 Secondary malignant neoplasm of liver and intrahepatic bile duct: Secondary | ICD-10-CM | POA: Insufficient documentation

## 2021-06-21 DIAGNOSIS — I10 Essential (primary) hypertension: Secondary | ICD-10-CM | POA: Insufficient documentation

## 2021-06-21 DIAGNOSIS — E785 Hyperlipidemia, unspecified: Secondary | ICD-10-CM | POA: Diagnosis not present

## 2021-06-21 LAB — CMP (CANCER CENTER ONLY)
ALT: 18 U/L (ref 0–44)
AST: 22 U/L (ref 15–41)
Albumin: 3.1 g/dL — ABNORMAL LOW (ref 3.5–5.0)
Alkaline Phosphatase: 154 U/L — ABNORMAL HIGH (ref 38–126)
Anion gap: 9 (ref 5–15)
BUN: 54 mg/dL — ABNORMAL HIGH (ref 8–23)
CO2: 24 mmol/L (ref 22–32)
Calcium: 9.1 mg/dL (ref 8.9–10.3)
Chloride: 103 mmol/L (ref 98–111)
Creatinine: 1.16 mg/dL (ref 0.61–1.24)
GFR, Estimated: >60 mL/min (ref >60)
Glucose, Bld: 115 mg/dL — ABNORMAL HIGH (ref 70–99)
Potassium: 3.9 mmol/L (ref 3.5–5.1)
Sodium: 136 mmol/L (ref 135–145)
Total Bilirubin: 0.8 mg/dL (ref 0.3–1.2)
Total Protein: 7.5 g/dL (ref 6.5–8.1)

## 2021-06-21 NOTE — Progress Notes (Signed)
[place] OFFICE PROGRESS NOTE   Diagnosis: Pancreas cancer  INTERVAL HISTORY:   Gary [name] returns for follow-up.  He last underwent a paracentesis on 05/22/2021.  He continues Lasix and Aldactone under the direction of Gary [name].  No significant abdominal pain.  Appetite is stable.  No nausea or vomiting.  Periodic constipation.  He tries to remain active.  Pruritus continues to be improved.  Objective:  Vital signs in last 24 hours:  Blood pressure 133/87, pulse 88, temperature 97.7 F (36.5 C), temperature source Oral, resp. rate 20, height 5\' 4"  (1.626 m), weight 139 lb (63 kg), SpO2 100 %.   Resp: Lungs clear bilaterally. Cardio: Regular rate and rhythm. GI: Abdomen is softly distended, appears to have ascites. Vascular: No leg edema. Port-A-Cath without erythema.  Lab Results:  Lab Results  Component Value Date   WBC 6.8 05/01/2021   HGB 13.2 05/01/2021   HCT 40.4 05/01/2021   MCV 86.7 05/01/2021   PLT 191.0 05/01/2021   NEUTROABS 5.3 05/01/2021    Imaging:  No results found.  Medications: I have reviewed the patient's current medications.  Assessment/Plan: Pancreas tail lesion, multiple liver lesions Chest CT 04/12/2020-ascending thoracic aortic aneurysm; hypovascular lesion tail of the pancreas, new compared to the prior examination.  Multiple poorly defined hypovascular lesions scattered throughout the liver CT abdomen/pelvis 05/02/2020-hypoenhancing lesion of the pancreatic tail measuring approximately 2.4 x 1.9 cm; enlarged portacaval lymph nodes measuring up to 1.9 x 1.4 cm; numerous hypodense lesions throughout the liver, index lesion of the liver dome measuring 1.7 x 1.2 cm. Ultrasound-guided biopsy of a left liver lesion 05/10/2020-poorly differentiated carcinoma consistent with a pancreatobiliary primary Markedly elevated CA 19-9  Cycle 1 day 1 gemcitabine/Abraxane 05/26/2020 Cycle 1 day 15 gemcitabine/Abraxane 06/12/2020 Cycle 2 day 1  gemcitabine/Abraxane 07/04/2020 (chemotherapy doses reduced, antiemetic regimen adjusted) Cycle 2 day 15 Gemcitabine/Abraxane 07/18/2020 (continue same dose reduction, reduce post-treatment steroids due to insomnia) Cycle 3 day 1 gem/abraxane 08/02/20 Cycle 3 day 15 gem/Abraxane 08/15/2020 CTs abdomen/pelvis 08/25/2020-pancreas tail mass and portacaval lymphadenopathy decreased.  Liver metastases reported as increased, further review with no significant change in the majority of liver lesions and a decrease in the size of a few lesions, no new margin lesions Cycle 4-day 1 gemcitabine/Abraxane 09/05/2020 Paracentesis 09/26/2020-reactive mesothelial cells Paracentesis 10/09/2020-reactive mesothelial cells Cycle 5 gemcitabine/Abraxane 10/12/2020 Cycle 6 gemcitabine/Abraxane 11/01/2020 Cycle 7 gemcitabine/Abraxane 11/22/2020 Cycle 8 gemcitabine/Abraxane 12/13/2020 Cycle 9 gemcitabine/Abraxane 01/03/2021 CT abdomen/pelvis 01/29/2021-decrease in pancreas tail mass, decrease in size of multiple hepatic metastases, decreased size of hepatoduodenal ligament lymph nodes, progressive ascites Cycle 10 gemcitabine/Abraxane 01/31/2021 CT abdomen/pelvis 04/13/2021- generally stable liver lesions, some have slightly decreased and others slightly increased compared to the CT 01/29/2021, pancreas mass not appreciated, large volume ascites   Thoracic aortic aneurysm Hypertension Hyperlipidemia History of bradycardia status post permanent pacemaker Mother had cervical cancer Pitting lower leg edema GERD, on omeprazole  Right upper back inflamed sebaceous cyst-culture from cyst drainage 10/23/2020-2 staph species, antibiotic changed to ciprofloxacin 10/27/2020, status post I&D procedure 11/17/2020 Refractory ascites-trial of spironolactone 03/23/2021; spironolactone and Lasix 04/04/2021--improved 06/21/2021        Disposition: Gary [name] appears unchanged.  He is accompanied to today's appointment by his son.  There is no  clinical evidence of disease progression.  We will follow-up on the CA 19-9 from today.  Plan for CT scans prior to next office visit in 6 weeks.  He will return for follow-up in 6 weeks, CT scans a few days  prior.  We are available to see him sooner if needed.  Ned Card ANP/GNP-BC   06/21/2021  2:25 PM

## 2021-06-22 ENCOUNTER — Other Ambulatory Visit: Payer: Medicare Other

## 2021-06-22 ENCOUNTER — Ambulatory Visit: Payer: Medicare Other | Admitting: Nurse Practitioner

## 2021-06-22 LAB — CANCER ANTIGEN 19-9: CA 19-9: 568 U/mL — ABNORMAL HIGH (ref 0–35)

## 2021-07-03 DIAGNOSIS — L2084 Intrinsic (allergic) eczema: Secondary | ICD-10-CM | POA: Diagnosis not present

## 2021-07-11 IMAGING — US IR PARACENTESIS
1 series · 1 of 1 positions shown · non-contrast
Comparison: none

INDICATION: Patient with history of metastatic pancreatic cancer, abdominal
distension, and recurrent ascites. Request is made for therapeutic
paracentesis up to 4 L.

[Series 1: ir (id) (id)/(id)/(id) ir · 1 of 1 slices shown]
[im 1/1]
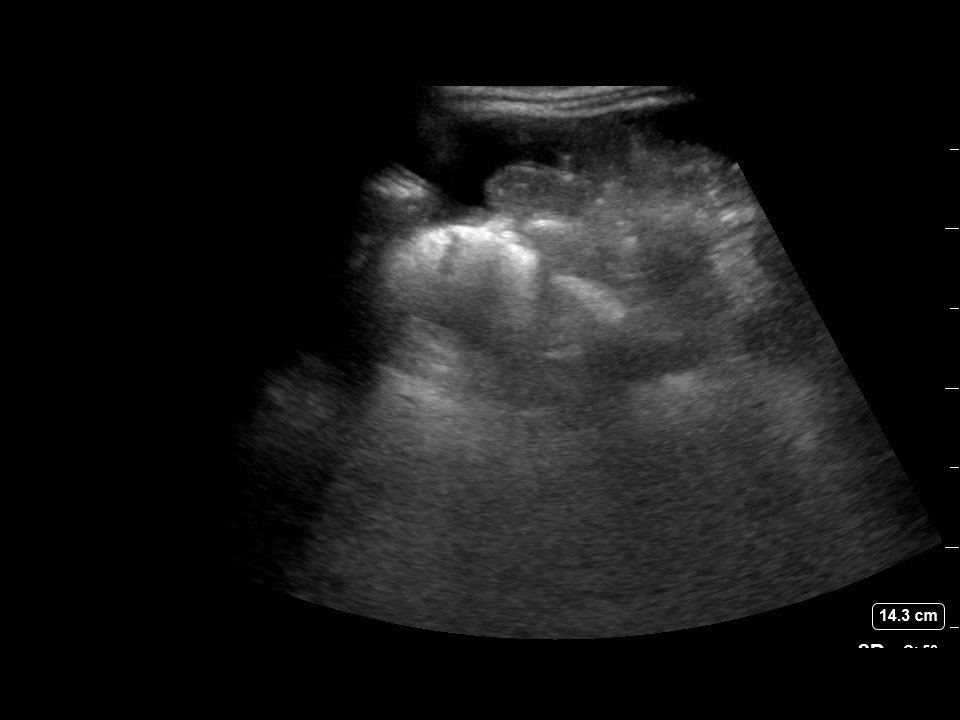

[1 of 1 positions shown; findings below may reference images not displayed]

EXAM:
ULTRASOUND GUIDED THERAPEUTIC PARACENTESIS

MEDICATIONS:
10 mL 1% lidocaine

COMPLICATIONS:
None immediate.

PROCEDURE:
Informed written consent was obtained from the patient after a
discussion of the risks, benefits and alternatives to treatment. A
timeout was performed prior to the initiation of the procedure.

Initial ultrasound scanning demonstrates a moderate amount of
ascites within the right lower abdominal quadrant. The right lower
abdomen was prepped and draped in the usual sterile fashion. 1%
lidocaine was used for local anesthesia.

Following this, a 19 gauge, 7-cm, Yueh catheter was introduced. An
ultrasound image was saved for documentation purposes. The
paracentesis was performed. The catheter was removed and a dressing
was applied. The patient tolerated the procedure well without
immediate post procedural complication.
FINDINGS: A total of approximately 3.5 L of clear gold fluid was removed.
IMPRESSION: Successful ultrasound-guided paracentesis yielding 3.5 L of
peritoneal fluid.

## 2021-07-16 DIAGNOSIS — Z961 Presence of intraocular lens: Secondary | ICD-10-CM | POA: Diagnosis not present

## 2021-07-16 DIAGNOSIS — H52203 Unspecified astigmatism, bilateral: Secondary | ICD-10-CM | POA: Diagnosis not present

## 2021-07-20 DIAGNOSIS — L853 Xerosis cutis: Secondary | ICD-10-CM | POA: Diagnosis not present

## 2021-07-20 DIAGNOSIS — L218 Other seborrheic dermatitis: Secondary | ICD-10-CM | POA: Diagnosis not present

## 2021-07-20 DIAGNOSIS — L2084 Intrinsic (allergic) eczema: Secondary | ICD-10-CM | POA: Diagnosis not present

## 2021-07-31 ENCOUNTER — Encounter: Payer: Self-pay | Admitting: Internal Medicine

## 2021-07-31 ENCOUNTER — Ambulatory Visit (INDEPENDENT_AMBULATORY_CARE_PROVIDER_SITE_OTHER): Payer: Medicare Other | Admitting: Internal Medicine

## 2021-07-31 VITALS — BP 128/70 | HR 76 | Ht 64.0 in | Wt 139.5 lb

## 2021-07-31 DIAGNOSIS — C787 Secondary malignant neoplasm of liver and intrahepatic bile duct: Secondary | ICD-10-CM

## 2021-07-31 DIAGNOSIS — R188 Other ascites: Secondary | ICD-10-CM | POA: Diagnosis not present

## 2021-07-31 DIAGNOSIS — C259 Malignant neoplasm of pancreas, unspecified: Secondary | ICD-10-CM | POA: Diagnosis not present

## 2021-07-31 NOTE — Progress Notes (Signed)
Gary [name] 87 y.o. 11/11/1932 [id number]  Assessment & Plan:   Encounter Diagnoses  Name Primary?   Other ascites Yes   Pancreatic cancer metastasized to liver ([place])     Overall he is doing well with his ascites management and not having needed paracentesis for about 10 weeks now.  We may want to increase his diuretics if possible.  I will wait to see the results of his CT scan follow-up.  His CA 19-9 is taking up some which could mean a return to cancer treatment but we will see what Dr. Benay Spice thinks.  He should continue to force protein and eat the type of diet he is doing.  It sounds like his level of ascites is not so bad that it is significantly restricting oral intake.  We do want to continue to promote and allow adequate caloric intake.  I will see him back in October or sooner if needed.  He will continue to send me weights and blood pressure measurement through his daughter.  I appreciate the opportunity to care for this patient. CC: [name], MD Dr. Julieanne Manson Subjective:   Chief Complaint: Ascites in the setting of cancer  HPI The patient is here with his son and wife for follow-up of ascites that has occurred in the setting of pancreatic cancer metastasized to the liver, but has not been malignant as I initially thought.  He is on a regimen of spironolactone and furosemide as written below and has gone 10 weeks without needing a paracentesis.  He reports that he eats a breakfast of 2 eggs to sausages and 2 small high-protein pancakes every morning and they may have some Jell-O in the afternoon and then for dinner he eats a steak.  He will have vegetables with that.  Late at night before bed he has a bowl of shredded wheat with banana and perhaps other fruit.  He very much enjoys this.  His weight has crept up a little bit but it has not gone down.  He cannot tell whether his ascites is worse or not necessarily but he overall he seems to feel stable.  He  brought with him a list of weights and blood pressures today and I have been getting these communicated through his daughter in the interim as well.       Wt Readings from Last 3 Encounters:  07/31/21 139 lb 8 oz (63.3 kg)  06/21/21 139 lb (63 kg)  06/12/21 136 lb (61.7 kg)    Allergies  Allergen Reactions   Amlodipine Other (See Comments)   Gentamycin [Gentamicin Sulfate]     "Inner ears problems after Gent infusion" , also "unsteady mobility"   Hydrocodone    Aspirin Other (See Comments)    High doses cause stomach upset   Current Meds  Medication Sig   DUPIXENT 300 MG/2ML SOPN Inject 300 mg into the skin every 21 ( twenty-one) days.   furosemide (LASIX) 20 MG tablet 20 mg (1) alternating with 30 mg (1.5) daily every other day   omeprazole (PRILOSEC) 20 MG capsule Take 1-2 capsules by mouth daily.   spironolactone (ALDACTONE) 25 MG tablet 50 mg (2 pills) alternating with 75 mg (3 pills) every other day   Past Medical History:  Diagnosis Date   Ascending aortic aneurysm ([place]) 01/13/2014   stable at 4.7 cm diameter since 2009   Asystole x2, s/p pacemaker 01/13/2014   Bradycardia    permanent pacemaker 11/22/08 [employer]Jude   Family history  of adverse reaction to anesthesia    brother died after anesthesia / nose polyp removal at [facility] (40 years ago)   HTN (hypertension) 01/13/2014   Hyperlipidemia    Hypertension    Malignant ascites    Pacemaker - leads 1987, last generator 2009 [employer] Jude dual-chamber 01/13/2014   Atrial lead model 433-01, ventricular lead model 431-01 implanted November 1987 Right ventricular lead dysfunction with low impedance and mediocre sensing but satisfactory pacing threshold   Pacemaker lead malfunction 01/13/2014   Chronic low impedance and poor sensing of the ventricular lead   Pancreatic cancer ([place])    Metastatic to liver   Past Surgical History:  Procedure Laterality Date   IR IMAGING GUIDED PORT INSERTION  05/24/2020   IR PARACENTESIS  10/26/2020    IR PARACENTESIS  12/12/2020   IR PARACENTESIS  01/22/2021   IR PARACENTESIS  02/02/2021   IR PARACENTESIS  02/14/2021   IR PARACENTESIS  02/23/2021   IR PARACENTESIS  03/06/2021   IR PARACENTESIS  03/14/2021   IR PARACENTESIS  03/22/2021   IR PARACENTESIS  03/29/2021   IR PARACENTESIS  04/06/2021   IR PARACENTESIS  04/17/2021   IR PARACENTESIS  04/27/2021   IR PARACENTESIS  05/09/2021   IR PARACENTESIS  05/22/2021   PERMANENT PACEMAKER GENERATOR CHANGE  11/22/08   [employer] Jude   PERMANENT PACEMAKER INSERTION  03/04/97   [employer] Jude   US ECHOCARDIOGRAPHY  05/16/11   AS mild to mod.,TR mild to mod., EF => 55%.   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   Review of Systems As per HPI  Objective:   Physical Exam BP 128/70   Pulse 76   Ht '5\' 4"'$  (1.626 m)   Wt 139 lb 8 oz (63.3 kg)   SpO2 97%   BMI 23.95 kg/m  Chronically ill man NAD Lungs cta Cor S1s2 no rmg Abd mod ascites soft and NT Ext tr edema Alert and oriented x 3   A total of 31 minutes was spent on this visit both before being with the patient during the visit and afterwards.

## 2021-07-31 NOTE — Patient Instructions (Signed)
Good to see you.  I am pleased with your status regarding the fluid.  I will check the CT scans and let you know if anything needs to change.  Enjoy the steak!  I appreciate the opportunity to care for you. Gatha Mayer, MD, [facility]

## 2021-08-02 ENCOUNTER — Inpatient Hospital Stay: Payer: Medicare Other | Attending: Oncology

## 2021-08-02 ENCOUNTER — Inpatient Hospital Stay: Payer: Medicare Other

## 2021-08-02 ENCOUNTER — Other Ambulatory Visit: Payer: Self-pay

## 2021-08-02 ENCOUNTER — Ambulatory Visit (HOSPITAL_BASED_OUTPATIENT_CLINIC_OR_DEPARTMENT_OTHER)
Admission: RE | Admit: 2021-08-02 | Discharge: 2021-08-02 | Disposition: A | Discharge status: Home / Self Care | Payer: Medicare Other | Source: Ambulatory Visit | Attending: Nurse Practitioner | Admitting: Nurse Practitioner

## 2021-08-02 VITALS — BP 138/77 | HR 70 | Temp 97.9°F | Resp 18

## 2021-08-02 DIAGNOSIS — C252 Malignant neoplasm of tail of pancreas: Secondary | ICD-10-CM | POA: Insufficient documentation

## 2021-08-02 DIAGNOSIS — K8020 Calculus of gallbladder without cholecystitis without obstruction: Secondary | ICD-10-CM | POA: Diagnosis not present

## 2021-08-02 DIAGNOSIS — C787 Secondary malignant neoplasm of liver and intrahepatic bile duct: Secondary | ICD-10-CM | POA: Diagnosis not present

## 2021-08-02 DIAGNOSIS — Z452 Encounter for adjustment and management of vascular access device: Secondary | ICD-10-CM | POA: Diagnosis not present

## 2021-08-02 DIAGNOSIS — Z95828 Presence of other vascular implants and grafts: Secondary | ICD-10-CM

## 2021-08-02 DIAGNOSIS — C259 Malignant neoplasm of pancreas, unspecified: Secondary | ICD-10-CM | POA: Diagnosis not present

## 2021-08-02 LAB — CMP (CANCER CENTER ONLY)
ALT: 20 U/L (ref 0–44)
AST: 23 U/L (ref 15–41)
Albumin: 3.3 g/dL — ABNORMAL LOW (ref 3.5–5.0)
Alkaline Phosphatase: 149 U/L — ABNORMAL HIGH (ref 38–126)
Anion gap: 8 (ref 5–15)
BUN: 56 mg/dL — ABNORMAL HIGH (ref 8–23)
CO2: 26 mmol/L (ref 22–32)
Calcium: 9.1 mg/dL (ref 8.9–10.3)
Chloride: 100 mmol/L (ref 98–111)
Creatinine: 1.23 mg/dL (ref 0.61–1.24)
GFR, Estimated: 57 mL/min — ABNORMAL LOW (ref >60)
Glucose, Bld: 81 mg/dL (ref 70–99)
Potassium: 4.5 mmol/L (ref 3.5–5.1)
Sodium: 134 mmol/L — ABNORMAL LOW (ref 135–145)
Total Bilirubin: 0.8 mg/dL (ref 0.3–1.2)
Total Protein: 7.4 g/dL (ref 6.5–8.1)

## 2021-08-02 LAB — CBC WITH DIFFERENTIAL (CANCER CENTER ONLY)
Abs Immature Granulocytes: 0.04 10*3/uL (ref 0.00–0.07)
Basophils Absolute: 0 10*3/uL (ref 0.0–0.1)
Basophils Relative: 0 %
Eosinophils Absolute: 0 10*3/uL (ref 0.0–0.5)
Eosinophils Relative: 0 %
HCT: 37.6 % — ABNORMAL LOW (ref 39.0–52.0)
Hemoglobin: 11.9 g/dL — ABNORMAL LOW (ref 13.0–17.0)
Immature Granulocytes: 1 %
Lymphocytes Relative: 13 %
Lymphs Abs: 1.1 10*3/uL (ref 0.7–4.0)
MCH: 26.5 pg (ref 26.0–34.0)
MCHC: 31.6 g/dL (ref 30.0–36.0)
MCV: 83.7 fL (ref 80.0–100.0)
Monocytes Absolute: 0.8 10*3/uL (ref 0.1–1.0)
Monocytes Relative: 10 %
Neutro Abs: 6.2 10*3/uL (ref 1.7–7.7)
Neutrophils Relative %: 76 %
Platelet Count: 208 10*3/uL (ref 150–400)
RBC: 4.49 MIL/uL (ref 4.22–5.81)
RDW: 17.2 % — ABNORMAL HIGH (ref 11.5–15.5)
WBC Count: 8.2 10*3/uL (ref 4.0–10.5)
nRBC: 0 % (ref 0.0–0.2)

## 2021-08-02 MED ORDER — HEPARIN SOD (PORK) LOCK FLUSH 100 UNIT/ML IV SOLN
500.0000 [IU] | Freq: Once | INTRAVENOUS | Status: AC
  Administered 2021-08-02: 500 [IU] via INTRAVENOUS

## 2021-08-02 MED ORDER — SODIUM CHLORIDE 0.9% FLUSH: 10.0000 mL | INTRAVENOUS | Status: DC | PRN

## 2021-08-02 MED ORDER — HEPARIN SOD (PORK) LOCK FLUSH 100 UNIT/ML IV SOLN: 500.0000 [IU] | Freq: Once | INTRAVENOUS | Status: DC | PRN

## 2021-08-02 MED ORDER — IOHEXOL 350 MG/ML SOLN
60.0000 mL | Freq: Once | INTRAVENOUS | Status: AC | PRN
  Administered 2021-08-02: 60 mL via INTRAVENOUS

## 2021-08-06 ENCOUNTER — Other Ambulatory Visit: Payer: Self-pay

## 2021-08-06 ENCOUNTER — Inpatient Hospital Stay (HOSPITAL_BASED_OUTPATIENT_CLINIC_OR_DEPARTMENT_OTHER): Payer: Medicare Other | Admitting: Oncology

## 2021-08-06 VITALS — BP 132/81 | HR 76 | Temp 97.8°F | Resp 18 | Ht 64.0 in | Wt 141.6 lb

## 2021-08-06 DIAGNOSIS — C252 Malignant neoplasm of tail of pancreas: Secondary | ICD-10-CM | POA: Diagnosis not present

## 2021-08-06 NOTE — Progress Notes (Signed)
[place] OFFICE PROGRESS NOTE   Diagnosis: Pancreas cancer  INTERVAL HISTORY:   [name] returns as scheduled.  He continues follow-up with Dr. Bernita [name] for management of ascites.  He is taking furosemide and spironolactone.  He last underwent a paracentesis for 4.1 L of fluid on 05/22/2021.  He has a good appetite.  He stays in the house most of the time.  He has developed discomfort and fullness in the right groin for the past week.  This area is uncomfortable when he lies on the right side.  Objective:  Vital signs in last 24 hours:  Blood pressure 132/81, pulse 76, temperature 97.8 F (36.6 C), temperature source Oral, resp. rate 18, height '5\' 4"'$  (1.626 m), weight 141 lb 9.6 oz (64.2 kg), SpO2 100 %.    Resp: Lungs clear bilaterally Cardio: Regular rate and rhythm GI: No hepatosplenomegaly, mildly distended, no mass.  Soft fullness in the right inguinal region-reducible, no discrete mass Vascular: No leg edema  Portacath/PICC-without erythema  Lab Results:  Lab Results  Component Value Date   WBC 8.2 08/02/2021   HGB 11.9 (L) 08/02/2021   HCT 37.6 (L) 08/02/2021   MCV 83.7 08/02/2021   PLT 208 08/02/2021   NEUTROABS 6.2 08/02/2021    CMP  Lab Results  Component Value Date   NA 134 (L) 08/02/2021   K 4.5 08/02/2021   CL 100 08/02/2021   CO2 26 08/02/2021   GLUCOSE 81 08/02/2021   BUN 56 (H) 08/02/2021   CREATININE 1.23 08/02/2021   CALCIUM 9.1 08/02/2021   PROT 7.4 08/02/2021   ALBUMIN 3.3 (L) 08/02/2021   AST 23 08/02/2021   ALT 20 08/02/2021   ALKPHOS 149 (H) 08/02/2021   BILITOT 0.8 08/02/2021   GFRNONAA 57 (L) 08/02/2021   GFRAA >60 09/05/2020    Lab Results  Component Value Date   [id number] 568 (H) 06/21/2021     Medications: I have reviewed the patient's current medications.   Assessment/Plan:  Pancreas tail lesion, multiple liver lesions Chest CT 04/12/2020-ascending thoracic aortic aneurysm; hypovascular lesion tail of  the pancreas, new compared to the prior examination.  Multiple poorly defined hypovascular lesions scattered throughout the liver CT abdomen/pelvis 05/02/2020-hypoenhancing lesion of the pancreatic tail measuring approximately 2.4 x 1.9 cm; enlarged portacaval lymph nodes measuring up to 1.9 x 1.4 cm; numerous hypodense lesions throughout the liver, index lesion of the liver dome measuring 1.7 x 1.2 cm. Ultrasound-guided biopsy of a left liver lesion 05/10/2020-poorly differentiated carcinoma consistent with a pancreatobiliary primary Markedly elevated CA 19-9  Cycle 1 day 1 gemcitabine/Abraxane 05/26/2020 Cycle 1 day 15 gemcitabine/Abraxane 06/12/2020 Cycle 2 day 1 gemcitabine/Abraxane 07/04/2020 (chemotherapy doses reduced, antiemetic regimen adjusted) Cycle 2 day 15 Gemcitabine/Abraxane 07/18/2020 (continue same dose reduction, reduce post-treatment steroids due to insomnia) Cycle 3 day 1 gem/abraxane 08/02/20 Cycle 3 day 15 gem/Abraxane 08/15/2020 CTs abdomen/pelvis 08/25/2020-pancreas tail mass and portacaval lymphadenopathy decreased.  Liver metastases reported as increased, further review with no significant change in the majority of liver lesions and a decrease in the size of a few lesions, no new margin lesions Cycle 4-day 1 gemcitabine/Abraxane 09/05/2020 Paracentesis 09/26/2020-reactive mesothelial cells Paracentesis 10/09/2020-reactive mesothelial cells Cycle 5 gemcitabine/Abraxane 10/12/2020 Cycle 6 gemcitabine/Abraxane 11/01/2020 Cycle 7 gemcitabine/Abraxane 11/22/2020 Cycle 8 gemcitabine/Abraxane 12/13/2020 Cycle 9 gemcitabine/Abraxane 01/03/2021 CT abdomen/pelvis 01/29/2021-decrease in pancreas tail mass, decrease in size of multiple hepatic metastases, decreased size of hepatoduodenal ligament lymph nodes, progressive ascites Cycle 10 gemcitabine/Abraxane 01/31/2021 CT abdomen/pelvis 04/13/2021- generally stable liver lesions,  some have slightly decreased and others slightly increased compared  to the CT 01/29/2021, pancreas mass not appreciated, large volume ascites CT abdomen/pelvis 08/02/2021-multiple liver lesions-some are unchanged, fluid density lesions are present-some larger-treated metastases?,  Increased small right pleural effusion, ascites, tail of pancreas lesion Or measurable   Thoracic aortic aneurysm Hypertension Hyperlipidemia History of bradycardia status post permanent pacemaker Mother had cervical cancer Pitting lower leg edema GERD, on omeprazole  Right upper back inflamed sebaceous cyst-culture from cyst drainage 10/23/2020-2 staph species, antibiotic changed to ciprofloxacin 10/27/2020, status post I&D procedure 11/17/2020 Refractory ascites-trial of spironolactone 03/23/2021; spironolactone and Lasix 04/04/2021--improved 06/21/2021        Disposition: [name] has a history of metastatic pancreas cancer.  He has been maintained off of systemic therapy since February of this year.  There is no clinical evidence of disease progression.  The restaging CT reveals overall stable disease.  I reviewed the CT images of [name].  He has discomfort in the right groin and appears to have a small right inguinal hernia.  He will call for increased pain in this area.  He will continue follow-up with Dr. Katha [name] for management of ascites.  [name] will return for an office visit in 3 weeks.  We will consider resuming systemic therapy when he has clear evidence of disease progression.  Betsy Coder, MD  08/06/2021  2:58 PM

## 2021-08-07 DIAGNOSIS — L2084 Intrinsic (allergic) eczema: Secondary | ICD-10-CM | POA: Diagnosis not present

## 2021-08-08 LAB — CANCER ANTIGEN 19-9: CA 19-9: 656 U/mL — ABNORMAL HIGH (ref 0–35)

## 2021-08-21 DIAGNOSIS — L2089 Other atopic dermatitis: Secondary | ICD-10-CM | POA: Diagnosis not present

## 2021-08-23 ENCOUNTER — Telehealth: Payer: Self-pay

## 2021-08-23 NOTE — Telephone Encounter (Signed)
Returned call to family spoke with wife advised to follow up with PCP or surgeon concerning RT groin

## 2021-08-24 ENCOUNTER — Other Ambulatory Visit (INDEPENDENT_AMBULATORY_CARE_PROVIDER_SITE_OTHER): Payer: Medicare Other

## 2021-08-24 ENCOUNTER — Telehealth: Payer: Self-pay | Admitting: Internal Medicine

## 2021-08-24 DIAGNOSIS — R188 Other ascites: Secondary | ICD-10-CM

## 2021-08-24 DIAGNOSIS — C787 Secondary malignant neoplasm of liver and intrahepatic bile duct: Secondary | ICD-10-CM | POA: Diagnosis not present

## 2021-08-24 DIAGNOSIS — R180 Malignant ascites: Secondary | ICD-10-CM

## 2021-08-24 DIAGNOSIS — C259 Malignant neoplasm of pancreas, unspecified: Secondary | ICD-10-CM | POA: Diagnosis not present

## 2021-08-24 LAB — BASIC METABOLIC PANEL
BUN: 102 mg/dL (ref 6–23)
CO2: 19 mEq/L (ref 19–32)
Calcium: 9.4 mg/dL (ref 8.4–10.5)
Chloride: 107 mEq/L (ref 96–112)
Creatinine, Ser: 2.42 mg/dL — ABNORMAL HIGH (ref 0.40–1.50)
GFR: 23.52 mL/min — ABNORMAL LOW (ref >60.00)
Glucose, Bld: 115 mg/dL — ABNORMAL HIGH (ref 70–99)
Potassium: 4.5 mEq/L (ref 3.5–5.1)
Sodium: 137 mEq/L (ref 135–145)

## 2021-08-24 NOTE — Telephone Encounter (Signed)
Inbound call from patient's son, Barnabas Lister requesting to be called instead of patient.  Can be reached at [phone].

## 2021-08-24 NOTE — Telephone Encounter (Signed)
I spoke with patient's son Barnabas Lister.  He verbalized understanding of paracentesis for 9/12 at [facility].  He needs to arrive at 1:45 for a 2:00 para.  He will have him hold his diuretics on 9/12.  They will try and bring patient today for labs. He understands to go to ED for severe pain that is not relieved by the ibuprofen or ice packs

## 2021-08-24 NOTE — Telephone Encounter (Signed)
ALSO  Hold spironolactone and furosemide day of paracentesis

## 2021-08-24 NOTE — Telephone Encounter (Signed)
Received message via text that he had a painful inguinal hernia  Saw in person - small inguinal bulge on right - soft but tender - seems reducible  Ascites is better but still has it - think ascites creating pressure and causing this (no hernia described on 04/2021 CT report)  Plan:  1) Arrange paracentesis - 2 L Max - after noon - hopefully next week  - studies - cytology, cell count and diff, amylase  2) BMET also please - today or early next week  3) advised continue ibuprofen as per Dr. Benay Spice and ice and call back go to ED if severe unremittig pain

## 2021-08-27 ENCOUNTER — Other Ambulatory Visit (INDEPENDENT_AMBULATORY_CARE_PROVIDER_SITE_OTHER): Payer: Medicare Other

## 2021-08-27 ENCOUNTER — Ambulatory Visit (HOSPITAL_COMMUNITY): Payer: Medicare Other

## 2021-08-27 ENCOUNTER — Other Ambulatory Visit: Payer: Self-pay

## 2021-08-27 DIAGNOSIS — N179 Acute kidney failure, unspecified: Secondary | ICD-10-CM

## 2021-08-27 DIAGNOSIS — R1031 Right lower quadrant pain: Secondary | ICD-10-CM

## 2021-08-27 LAB — CBC
HCT: 35.5 % — ABNORMAL LOW (ref 39.0–52.0)
Hemoglobin: 11.6 g/dL — ABNORMAL LOW (ref 13.0–17.0)
MCHC: 32.6 g/dL (ref 30.0–36.0)
MCV: 82.6 fl (ref 78.0–100.0)
Platelets: 168 10*3/uL (ref 150.0–400.0)
RBC: 4.29 Mil/uL (ref 4.22–5.81)
RDW: 17 % — ABNORMAL HIGH (ref 11.5–15.5)
WBC: 8.8 10*3/uL (ref 4.0–10.5)

## 2021-08-27 LAB — COMPREHENSIVE METABOLIC PANEL
ALT: 16 U/L (ref 0–53)
AST: 18 U/L (ref 0–37)
Albumin: 3.3 g/dL — ABNORMAL LOW (ref 3.5–5.2)
Alkaline Phosphatase: 107 U/L (ref 39–117)
BUN: 78 mg/dL — ABNORMAL HIGH (ref 6–23)
CO2: 21 mEq/L (ref 19–32)
Calcium: 9.1 mg/dL (ref 8.4–10.5)
Chloride: 108 mEq/L (ref 96–112)
Creatinine, Ser: 1.61 mg/dL — ABNORMAL HIGH (ref 0.40–1.50)
GFR: 38.35 mL/min — ABNORMAL LOW (ref >60.00)
Glucose, Bld: 68 mg/dL — ABNORMAL LOW (ref 70–99)
Potassium: 4.4 mEq/L (ref 3.5–5.1)
Sodium: 137 mEq/L (ref 135–145)
Total Bilirubin: 0.5 mg/dL (ref 0.2–1.2)
Total Protein: 7.6 g/dL (ref 6.0–8.3)

## 2021-08-27 NOTE — Progress Notes (Signed)
Sent to Stephanie,LPN to f/u

## 2021-08-30 ENCOUNTER — Ambulatory Visit: Payer: Medicare Other | Admitting: Oncology

## 2021-08-30 ENCOUNTER — Other Ambulatory Visit: Payer: Medicare Other

## 2021-08-31 ENCOUNTER — Other Ambulatory Visit: Payer: Self-pay

## 2021-08-31 ENCOUNTER — Ambulatory Visit (HOSPITAL_BASED_OUTPATIENT_CLINIC_OR_DEPARTMENT_OTHER)
Admission: RE | Admit: 2021-08-31 | Discharge: 2021-08-31 | Disposition: A | Discharge status: Home / Self Care | Payer: Medicare Other | Source: Ambulatory Visit | Attending: Internal Medicine | Admitting: Internal Medicine

## 2021-08-31 DIAGNOSIS — K5730 Diverticulosis of large intestine without perforation or abscess without bleeding: Secondary | ICD-10-CM | POA: Diagnosis not present

## 2021-08-31 DIAGNOSIS — R1031 Right lower quadrant pain: Secondary | ICD-10-CM | POA: Insufficient documentation

## 2021-08-31 DIAGNOSIS — K8020 Calculus of gallbladder without cholecystitis without obstruction: Secondary | ICD-10-CM | POA: Diagnosis not present

## 2021-09-04 ENCOUNTER — Other Ambulatory Visit: Payer: Self-pay

## 2021-09-04 DIAGNOSIS — L2089 Other atopic dermatitis: Secondary | ICD-10-CM | POA: Diagnosis not present

## 2021-09-04 DIAGNOSIS — R188 Other ascites: Secondary | ICD-10-CM

## 2021-09-07 ENCOUNTER — Other Ambulatory Visit: Payer: Self-pay

## 2021-09-07 ENCOUNTER — Ambulatory Visit (HOSPITAL_COMMUNITY)
Admission: RE | Admit: 2021-09-07 | Discharge: 2021-09-07 | Disposition: A | Discharge status: Home / Self Care | Payer: Medicare Other | Source: Ambulatory Visit | Attending: Internal Medicine | Admitting: Internal Medicine

## 2021-09-07 DIAGNOSIS — R188 Other ascites: Secondary | ICD-10-CM

## 2021-09-07 DIAGNOSIS — C787 Secondary malignant neoplasm of liver and intrahepatic bile duct: Secondary | ICD-10-CM | POA: Insufficient documentation

## 2021-09-07 DIAGNOSIS — R180 Malignant ascites: Secondary | ICD-10-CM | POA: Insufficient documentation

## 2021-09-07 DIAGNOSIS — C259 Malignant neoplasm of pancreas, unspecified: Secondary | ICD-10-CM | POA: Insufficient documentation

## 2021-09-07 HISTORY — PX: IR PARACENTESIS: IMG2679

## 2021-09-07 LAB — BODY FLUID CELL COUNT WITH DIFFERENTIAL
Lymphs, Fluid: 36 %
Monocyte-Macrophage-Serous Fluid: 54 % (ref 50–90)
Neutrophil Count, Fluid: 6 % (ref 0–25)
Total Nucleated Cell Count, Fluid: 102 cu mm (ref 0–1000)

## 2021-09-07 LAB — AMYLASE, PLEURAL OR PERITONEAL FLUID: Amylase, Fluid: 33 U/L

## 2021-09-07 MED ORDER — LIDOCAINE HCL (PF) 1 % IJ SOLN
INTRAMUSCULAR | Status: DC | PRN
  Administered 2021-09-07: 10 mL

## 2021-09-07 MED ORDER — LIDOCAINE HCL 1 % IJ SOLN
INTRAMUSCULAR | Status: AC
  Filled 2021-09-07: qty 20

## 2021-09-07 NOTE — Procedures (Signed)
PROCEDURE SUMMARY:  Successful US guided paracentesis from left lateral abdomen.  Yielded 2.0 liters of amber fluid.  No immediate complications.  Pt tolerated well.   Specimen was sent for labs.  EBL < 72mL  Docia Barrier PA-C 09/07/2021 2:54 PM

## 2021-09-10 ENCOUNTER — Other Ambulatory Visit (INDEPENDENT_AMBULATORY_CARE_PROVIDER_SITE_OTHER): Payer: Medicare Other

## 2021-09-10 DIAGNOSIS — R188 Other ascites: Secondary | ICD-10-CM | POA: Diagnosis not present

## 2021-09-10 LAB — BASIC METABOLIC PANEL
BUN: 34 mg/dL — ABNORMAL HIGH (ref 6–23)
CO2: 24 mEq/L (ref 19–32)
Calcium: 8.8 mg/dL (ref 8.4–10.5)
Chloride: 105 mEq/L (ref 96–112)
Creatinine, Ser: 1.3 mg/dL (ref 0.40–1.50)
GFR: 49.56 mL/min — ABNORMAL LOW (ref >60.00)
Glucose, Bld: 100 mg/dL — ABNORMAL HIGH (ref 70–99)
Potassium: 4.2 mEq/L (ref 3.5–5.1)
Sodium: 136 mEq/L (ref 135–145)

## 2021-09-10 LAB — CYTOLOGY - NON PAP

## 2021-09-14 DIAGNOSIS — Z1152 Encounter for screening for COVID-19: Secondary | ICD-10-CM | POA: Diagnosis not present

## 2021-09-17 ENCOUNTER — Other Ambulatory Visit: Payer: Self-pay

## 2021-09-17 ENCOUNTER — Inpatient Hospital Stay (HOSPITAL_BASED_OUTPATIENT_CLINIC_OR_DEPARTMENT_OTHER): Payer: Medicare Other | Admitting: Oncology

## 2021-09-17 ENCOUNTER — Inpatient Hospital Stay: Payer: Medicare Other | Attending: Oncology

## 2021-09-17 VITALS — BP 149/75 | HR 71 | Temp 98.1°F | Resp 19 | Ht 64.0 in | Wt 140.8 lb

## 2021-09-17 DIAGNOSIS — I7121 Aneurysm of the ascending aorta, without rupture: Secondary | ICD-10-CM | POA: Diagnosis not present

## 2021-09-17 DIAGNOSIS — C252 Malignant neoplasm of tail of pancreas: Secondary | ICD-10-CM

## 2021-09-17 DIAGNOSIS — Z452 Encounter for adjustment and management of vascular access device: Secondary | ICD-10-CM | POA: Insufficient documentation

## 2021-09-17 DIAGNOSIS — R188 Other ascites: Secondary | ICD-10-CM | POA: Diagnosis not present

## 2021-09-17 DIAGNOSIS — K219 Gastro-esophageal reflux disease without esophagitis: Secondary | ICD-10-CM | POA: Insufficient documentation

## 2021-09-17 DIAGNOSIS — J90 Pleural effusion, not elsewhere classified: Secondary | ICD-10-CM | POA: Diagnosis not present

## 2021-09-17 DIAGNOSIS — E785 Hyperlipidemia, unspecified: Secondary | ICD-10-CM | POA: Insufficient documentation

## 2021-09-17 DIAGNOSIS — C787 Secondary malignant neoplasm of liver and intrahepatic bile duct: Secondary | ICD-10-CM | POA: Diagnosis not present

## 2021-09-17 DIAGNOSIS — I10 Essential (primary) hypertension: Secondary | ICD-10-CM | POA: Diagnosis not present

## 2021-09-17 DIAGNOSIS — Z95828 Presence of other vascular implants and grafts: Secondary | ICD-10-CM

## 2021-09-17 MED ORDER — HEPARIN SOD (PORK) LOCK FLUSH 100 UNIT/ML IV SOLN
500.0000 [IU] | Freq: Once | INTRAVENOUS | Status: AC | PRN
  Administered 2021-09-17: 500 [IU]

## 2021-09-17 MED ORDER — SODIUM CHLORIDE 0.9% FLUSH
10.0000 mL | INTRAVENOUS | Status: DC | PRN
  Administered 2021-09-17: 10 mL

## 2021-09-17 NOTE — Progress Notes (Signed)
[place] OFFICE PROGRESS NOTE   Diagnosis: Pancreas cancer  INTERVAL HISTORY:   Gary [name] returns as scheduled.  He is here today with his wife and son.  He reports discoloration at the lower legs.  He is bothered by the right inguinal hernia.  The hernia is not painful.  He is now wearing a truss.  He reports a good appetite.  He last underwent a paracentesis for 2 L on 09/07/2021.  Objective:  Vital signs in last 24 hours:  Blood pressure (!) 149/75, pulse 71, temperature 98.1 F (36.7 C), temperature source Oral, resp. rate 19, height 5\' 4"  (1.626 m), weight 140 lb 12.8 oz (63.9 kg), SpO2 100 %.    Resp: Lungs clear bilaterally Cardio: Regular rate and rhythm GI: Mildly distended, no mass, no hepatomegaly, reducible right inguinal hernia Vascular: Trace lower leg edema bilaterally with chronic stasis change   Portacath/PICC-without erythema  Lab Results:  Lab Results  Component Value Date   WBC 8.8 08/27/2021   HGB 11.6 (L) 08/27/2021   HCT 35.5 (L) 08/27/2021   MCV 82.6 08/27/2021   PLT 168.0 08/27/2021   NEUTROABS 6.2 08/02/2021    CMP  Lab Results  Component Value Date   NA 136 09/10/2021   K 4.2 09/10/2021   CL 105 09/10/2021   CO2 24 09/10/2021   GLUCOSE 100 (H) 09/10/2021   BUN 34 (H) 09/10/2021   CREATININE 1.30 09/10/2021   CALCIUM 8.8 09/10/2021   PROT 7.6 08/27/2021   ALBUMIN 3.3 (L) 08/27/2021   AST 18 08/27/2021   ALT 16 08/27/2021   ALKPHOS 107 08/27/2021   BILITOT 0.5 08/27/2021   GFRNONAA 57 (L) 08/02/2021   GFRAA >60 09/05/2020    Lab Results  Component Value Date   [id number] 102 (H) 08/02/2021     Medications: I have reviewed the patient's current medications.   Assessment/Plan: Pancreas tail lesion, multiple liver lesions Chest CT 04/12/2020-ascending thoracic aortic aneurysm; hypovascular lesion tail of the pancreas, new compared to the prior examination.  Multiple poorly defined hypovascular lesions scattered  throughout the liver CT abdomen/pelvis 05/02/2020-hypoenhancing lesion of the pancreatic tail measuring approximately 2.4 x 1.9 cm; enlarged portacaval lymph nodes measuring up to 1.9 x 1.4 cm; numerous hypodense lesions throughout the liver, index lesion of the liver dome measuring 1.7 x 1.2 cm. Ultrasound-guided biopsy of a left liver lesion 05/10/2020-poorly differentiated carcinoma consistent with a pancreatobiliary primary Markedly elevated CA 19-9  Cycle 1 day 1 gemcitabine/Abraxane 05/26/2020 Cycle 1 day 15 gemcitabine/Abraxane 06/12/2020 Cycle 2 day 1 gemcitabine/Abraxane 07/04/2020 (chemotherapy doses reduced, antiemetic regimen adjusted) Cycle 2 day 15 Gemcitabine/Abraxane 07/18/2020 (continue same dose reduction, reduce post-treatment steroids due to insomnia) Cycle 3 day 1 gem/abraxane 08/02/20 Cycle 3 day 15 gem/Abraxane 08/15/2020 CTs abdomen/pelvis 08/25/2020-pancreas tail mass and portacaval lymphadenopathy decreased.  Liver metastases reported as increased, further review with no significant change in the majority of liver lesions and a decrease in the size of a few lesions, no new margin lesions Cycle 4-day 1 gemcitabine/Abraxane 09/05/2020 Paracentesis 09/26/2020-reactive mesothelial cells Paracentesis 10/09/2020-reactive mesothelial cells Cycle 5 gemcitabine/Abraxane 10/12/2020 Cycle 6 gemcitabine/Abraxane 11/01/2020 Cycle 7 gemcitabine/Abraxane 11/22/2020 Cycle 8 gemcitabine/Abraxane 12/13/2020 Cycle 9 gemcitabine/Abraxane 01/03/2021 CT abdomen/pelvis 01/29/2021-decrease in pancreas tail mass, decrease in size of multiple hepatic metastases, decreased size of hepatoduodenal ligament lymph nodes, progressive ascites Cycle 10 gemcitabine/Abraxane 01/31/2021 CT abdomen/pelvis 04/13/2021- generally stable liver lesions, some have slightly decreased and others slightly increased compared to the CT 01/29/2021, pancreas mass not appreciated, large  volume ascites CT abdomen/pelvis  08/02/2021-multiple liver lesions-some are unchanged, fluid density lesions are present-some larger-treated metastases?,  Increased small right pleural effusion, ascites, tail of pancreas lesion Not measurable CT abdomen/pelvis 08/31/2021-increased size of right inguinal hernia containing ascites, unchanged ascites and small right pleural effusion, stable liver lesions, no pancreas mass   Thoracic aortic aneurysm Hypertension Hyperlipidemia History of bradycardia status post permanent pacemaker Mother had cervical cancer Pitting lower leg edema GERD, on omeprazole  Right upper back inflamed sebaceous cyst-culture from cyst drainage 10/23/2020-2 staph species, antibiotic changed to ciprofloxacin 10/27/2020, status post I&D procedure 11/17/2020 Refractory ascites-trial of spironolactone 03/23/2021; spironolactone and Lasix 04/04/2021--improved 06/21/2021          Disposition: Mr. Rafanan appears stable.  The CA 19-9 has been slightly higher over the past few months.  Restaging CTs revealed no evidence of disease progression.  I reviewed the CT images with Mr. Hollar and his son.  I recommend continued observation for the pancreas cancer.  He has a reducible right inguinal hernia.  I did not recommend surgery unless the hernia becomes painful.  He will return for an office and lab visit in 6 weeks.  He continues follow-up with Dr. Katha Cabal for management of ascites.  Betsy Coder, MD  09/17/2021  3:31 PM

## 2021-09-19 DIAGNOSIS — L2089 Other atopic dermatitis: Secondary | ICD-10-CM | POA: Diagnosis not present

## 2021-09-19 DIAGNOSIS — B353 Tinea pedis: Secondary | ICD-10-CM | POA: Diagnosis not present

## 2021-09-24 DIAGNOSIS — C259 Malignant neoplasm of pancreas, unspecified: Secondary | ICD-10-CM | POA: Diagnosis not present

## 2021-09-24 DIAGNOSIS — C787 Secondary malignant neoplasm of liver and intrahepatic bile duct: Secondary | ICD-10-CM | POA: Diagnosis not present

## 2021-09-24 DIAGNOSIS — K4090 Unilateral inguinal hernia, without obstruction or gangrene, not specified as recurrent: Secondary | ICD-10-CM | POA: Diagnosis not present

## 2021-09-24 DIAGNOSIS — I495 Sick sinus syndrome: Secondary | ICD-10-CM | POA: Diagnosis not present

## 2021-09-24 DIAGNOSIS — Z0000 Encounter for general adult medical examination without abnormal findings: Secondary | ICD-10-CM | POA: Diagnosis not present

## 2021-09-24 DIAGNOSIS — Z23 Encounter for immunization: Secondary | ICD-10-CM | POA: Diagnosis not present

## 2021-09-24 DIAGNOSIS — E7800 Pure hypercholesterolemia, unspecified: Secondary | ICD-10-CM | POA: Diagnosis not present

## 2021-09-24 DIAGNOSIS — Z950 Presence of cardiac pacemaker: Secondary | ICD-10-CM | POA: Diagnosis not present

## 2021-09-24 DIAGNOSIS — I712 Thoracic aortic aneurysm, without rupture, unspecified: Secondary | ICD-10-CM | POA: Diagnosis not present

## 2021-09-24 DIAGNOSIS — R188 Other ascites: Secondary | ICD-10-CM | POA: Diagnosis not present

## 2021-09-24 DIAGNOSIS — E46 Unspecified protein-calorie malnutrition: Secondary | ICD-10-CM | POA: Diagnosis not present

## 2021-09-24 DIAGNOSIS — I10 Essential (primary) hypertension: Secondary | ICD-10-CM | POA: Diagnosis not present

## 2021-09-25 ENCOUNTER — Encounter: Payer: Self-pay | Admitting: Internal Medicine

## 2021-09-25 ENCOUNTER — Ambulatory Visit (INDEPENDENT_AMBULATORY_CARE_PROVIDER_SITE_OTHER): Payer: Medicare Other | Admitting: Internal Medicine

## 2021-09-25 VITALS — BP 118/62 | HR 67 | Ht 64.0 in | Wt 140.0 lb

## 2021-09-25 DIAGNOSIS — K4090 Unilateral inguinal hernia, without obstruction or gangrene, not specified as recurrent: Secondary | ICD-10-CM

## 2021-09-25 DIAGNOSIS — R188 Other ascites: Secondary | ICD-10-CM | POA: Diagnosis not present

## 2021-09-25 NOTE — Progress Notes (Signed)
Gary [name] 87 y.o. 08-22-1932 [id number]  Assessment & Plan:   Encounter Diagnoses  Name Primary?   Other ascites Yes   Right inguinal hernia     He is improved.  I appreciate Dr. Truitt [name] idea to try the tape to reduce the hernia and alleviate his pain.  I hope that continues to work and not cause problems I do not think it should.  Given his situation he is not a surgical candidate except in a last resort for incarceration etc.  I concur with her recommendations to try to eat high protein and even higher fat foods to build muscle mass and to gain that sort of weight.  Caution it may be tricky to sort out weight gain if he is accumulating ascites and we will have to keep that in mind.  I will review his labs that she will send and then make a determination about any adjustment in diuretics.  His family has sent me weights over time and we will continue to see those.  All things considered given his metastatic pancreatic cancer he has done very well.  I appreciate the opportunity to care for this patient. CC: Gary Neer, MD  The labs were sent to me and his BUN is 51 creatinine 1.47.  GFR about 46.  Electrolytes are normal albumin is 3.9.  Alk phos 143 TSH normal bilirubin normal transaminases normal.  We may need to reduce his diuretics but at this point I am going to recheck things.  I am concerned about management of his ascites and may have to tolerate some low-level renal insufficiency changes.  CC: Gary Neer, MD  Chief Complaint: Ascites  HPI Patient is here for follow-up of his ascites in the setting of pancreatic cancer metastasized to the liver, right inguinal hernia, and recent acute kidney injury thought related to diuretics.  Data listed below.  In the interim he is doing fairly well.  His weight has fluctuated a little bit he had a flu shot at Dr. Raul [name] office just the other day and that knocked him down for about 24 hours.  He feels much better.   He is back on his diuretics.  Wife asking if he could take a lower dose because he urinates a lot at night.  He tends to take the diuretics at about noon.  He sleeps late.  Dr. Brigitte [name] has suggested he eat a lot of protein and fatty foods as well to try to gain weight.  She also had a suggestion to use athletic tape or Kinesiotape to try to reduce the hernia that has been bothering him and he tried that and it is helping.  A truss was not helping.  Lab Results  Component Value Date   CREATININE 1.30 09/10/2021   BUN 34 (H) 09/10/2021   NA 136 09/10/2021   K 4.2 09/10/2021   CL 105 09/10/2021   CO2 24 09/10/2021      Wt Readings from Last 3 Encounters:  09/25/21 140 lb (63.5 kg)  09/17/21 140 lb 12.8 oz (63.9 kg)  08/06/21 141 lb 9.6 oz (64.2 kg)   CT% Abd/Pelvis w/o contrast 08/31/2021 15:50 IMPRESSION: Increased size of small to moderate right inguinal hernia, which contains ascites. No evidence of herniated bowel loops.   Moderate ascites and small right pleural effusion, without significant change.   Multiple small low-attenuation liver lesions remain stable.   Cholelithiasis. No radiographic evidence of cholecystitis.   Colonic diverticulosis. No radiographic evidence of  diverticulitis.   Large stool burden noted; recommend clinical correlation for possible constipation.   4.7 cm ascending thoracic aortic aneurysm.           Allergies  Allergen Reactions   Amlodipine Other (See Comments)   Gentamycin [Gentamicin Sulfate]     "Inner ears problems after Gent infusion" , also "unsteady mobility"   Hydrocodone    Aspirin Other (See Comments)    High doses cause stomach upset   Current Meds  Medication Sig   DUPIXENT 300 MG/2ML SOPN Inject 300 mg into the skin every 21 ( twenty-one) days.   furosemide (LASIX) 20 MG tablet 20 mg (1) alternating with 30 mg (1.5) daily every other day (Patient taking differently: 2 tabs daily)   omeprazole (PRILOSEC) 20 MG capsule  Take 1-2 capsules by mouth daily.   spironolactone (ALDACTONE) 25 MG tablet 50 mg (2 pills) alternating with 75 mg (3 pills) every other day (Patient taking differently: 25 mg. 1 tab daily)   Past Medical History:  Diagnosis Date   Ascending aortic aneurysm 01/13/2014   stable at 4.7 cm diameter since 2009   Asystole x2, s/p pacemaker 01/13/2014   Bradycardia    permanent pacemaker 11/22/08 St.Jude   Family history of adverse reaction to anesthesia    brother died after anesthesia / nose polyp removal at [facility] (40 years ago)   HTN (hypertension) 01/13/2014   Hyperlipidemia    Hypertension    Malignant ascites    Pacemaker - leads 1987, last generator 2009 St. Jude dual-chamber 01/13/2014   Atrial lead model 433-01, ventricular lead model 431-01 implanted November 1987 Right ventricular lead dysfunction with low impedance and mediocre sensing but satisfactory pacing threshold   Pacemaker lead malfunction 01/13/2014   Chronic low impedance and poor sensing of the ventricular lead   Pancreatic cancer ([place])    Metastatic to liver   Past Surgical History:  Procedure Laterality Date   IR IMAGING GUIDED PORT INSERTION  05/24/2020   IR PARACENTESIS  10/26/2020   IR PARACENTESIS  12/12/2020   IR PARACENTESIS  01/22/2021   IR PARACENTESIS  02/02/2021   IR PARACENTESIS  02/14/2021   IR PARACENTESIS  02/23/2021   IR PARACENTESIS  03/06/2021   IR PARACENTESIS  03/14/2021   IR PARACENTESIS  03/22/2021   IR PARACENTESIS  03/29/2021   IR PARACENTESIS  04/06/2021   IR PARACENTESIS  04/17/2021   IR PARACENTESIS  04/27/2021   IR PARACENTESIS  05/09/2021   IR PARACENTESIS  05/22/2021   IR PARACENTESIS  09/07/2021   PERMANENT PACEMAKER GENERATOR CHANGE  11/22/08   ST. Jude   PERMANENT PACEMAKER INSERTION  03/04/97   ST. Jude   US ECHOCARDIOGRAPHY  05/16/11   AS mild to mod.,TR mild to mod., EF => 55%.   Social History   Social History Narrative   Married, 2 sons 2 daughters   Originally from [place], owned Insurance claims handler for many years   No alcohol tobacco or drug use   family history includes Heart attack in his father; Ovarian cancer in his mother.   Review of Systems As above  Objective:   Physical Exam BP 118/62   [name] 67   Ht '5\' 4"'  (1.626 m)   Wt 140 lb (63.5 kg)   SpO2 98%   BMI 24.03 kg/m  Abdomen with some ascites is examined in the wheelchair and there is tape on the right inguinal hernia reducing the hernia  The patient is alert and oriented x3  and has an appropriate mood and affect

## 2021-09-25 NOTE — Patient Instructions (Signed)
Glad to see you today.  Try and take your diuretics earlier in the day.  Dr Carlean Purl is going to review the lab results once Dr [name] sends them over.  I appreciate the opportunity to care for you. Gary Rusk, MD, [facility]

## 2021-09-27 ENCOUNTER — Telehealth: Payer: Self-pay | Admitting: Internal Medicine

## 2021-09-27 DIAGNOSIS — R188 Other ascites: Secondary | ICD-10-CM

## 2021-09-27 NOTE — Telephone Encounter (Signed)
Inbound call from patient daughter, confirming if Dr. Carlean Purl received labs results from Dr. Brigitte Pulse so she will know any adjustments for diuretics

## 2021-09-27 NOTE — Telephone Encounter (Signed)
Pt daughter states that the Pt ( her father) had labs done at PCP office Dr. [name]. Daughter states that she is wanting to know if you have received the labs and if so is there any adjustments to his diuretics. Please Advise. Thanks

## 2021-09-28 NOTE — Telephone Encounter (Signed)
I spoke to his daughter.  I looked at the kidney function it is mildly abnormal with a GFR at 46.  Creatinine 1.47 BUN 51.  I am going to sit tight on his medical regimen and recheck labs next week.  I have spoken to his daughter Gary [name] who is aware of the plan and will let her father know to come for a be met Tuesday or Wednesday next week I have ordered that.

## 2021-09-28 NOTE — Telephone Encounter (Signed)
Please contact Gary Neer, MD office and ask for labs to be faxed over  They should do that w/o ROI since mutual patients  Need this AM please

## 2021-09-28 NOTE — Telephone Encounter (Signed)
[name], MD office was contacted. Spoke with Vaughan Basta  and asked for labs to be faxed over. Vaughan Basta stated that she would fax them right over.

## 2021-10-03 ENCOUNTER — Other Ambulatory Visit (INDEPENDENT_AMBULATORY_CARE_PROVIDER_SITE_OTHER): Payer: Medicare Other

## 2021-10-03 ENCOUNTER — Other Ambulatory Visit: Payer: Self-pay | Admitting: Internal Medicine

## 2021-10-03 DIAGNOSIS — R188 Other ascites: Secondary | ICD-10-CM

## 2021-10-03 LAB — BASIC METABOLIC PANEL
BUN: 72 mg/dL — ABNORMAL HIGH (ref 6–23)
CO2: 22 mEq/L (ref 19–32)
Calcium: 9.1 mg/dL (ref 8.4–10.5)
Chloride: 102 mEq/L (ref 96–112)
Creatinine, Ser: 1.58 mg/dL — ABNORMAL HIGH (ref 0.40–1.50)
GFR: 39.2 mL/min — ABNORMAL LOW (ref >60.00)
Glucose, Bld: 83 mg/dL (ref 70–99)
Potassium: 4.5 mEq/L (ref 3.5–5.1)
Sodium: 134 mEq/L — ABNORMAL LOW (ref 135–145)

## 2021-10-04 ENCOUNTER — Other Ambulatory Visit: Payer: Self-pay | Admitting: Internal Medicine

## 2021-10-04 DIAGNOSIS — L2089 Other atopic dermatitis: Secondary | ICD-10-CM | POA: Diagnosis not present

## 2021-10-11 ENCOUNTER — Other Ambulatory Visit (INDEPENDENT_AMBULATORY_CARE_PROVIDER_SITE_OTHER): Payer: Medicare Other

## 2021-10-11 DIAGNOSIS — R188 Other ascites: Secondary | ICD-10-CM | POA: Diagnosis not present

## 2021-10-11 LAB — BASIC METABOLIC PANEL
BUN: 57 mg/dL — ABNORMAL HIGH (ref 6–23)
CO2: 23 mEq/L (ref 19–32)
Calcium: 9.1 mg/dL (ref 8.4–10.5)
Chloride: 108 mEq/L (ref 96–112)
Creatinine, Ser: 1.54 mg/dL — ABNORMAL HIGH (ref 0.40–1.50)
GFR: 40.42 mL/min — ABNORMAL LOW (ref >60.00)
Glucose, Bld: 104 mg/dL — ABNORMAL HIGH (ref 70–99)
Potassium: 4 mEq/L (ref 3.5–5.1)
Sodium: 138 mEq/L (ref 135–145)

## 2021-10-12 ENCOUNTER — Other Ambulatory Visit: Payer: Self-pay | Admitting: Internal Medicine

## 2021-10-12 DIAGNOSIS — R188 Other ascites: Secondary | ICD-10-CM

## 2021-10-18 DIAGNOSIS — L2089 Other atopic dermatitis: Secondary | ICD-10-CM | POA: Diagnosis not present

## 2021-10-19 ENCOUNTER — Telehealth: Payer: Self-pay

## 2021-10-19 ENCOUNTER — Emergency Department (HOSPITAL_BASED_OUTPATIENT_CLINIC_OR_DEPARTMENT_OTHER)
Admission: EM | Admit: 2021-10-19 | Discharge: 2021-10-19 | Disposition: A | Discharge status: Home / Self Care | Payer: Medicare Other | Attending: Emergency Medicine | Admitting: Emergency Medicine

## 2021-10-19 ENCOUNTER — Emergency Department (HOSPITAL_BASED_OUTPATIENT_CLINIC_OR_DEPARTMENT_OTHER): Payer: Medicare Other

## 2021-10-19 ENCOUNTER — Other Ambulatory Visit: Payer: Self-pay

## 2021-10-19 ENCOUNTER — Encounter (HOSPITAL_BASED_OUTPATIENT_CLINIC_OR_DEPARTMENT_OTHER): Payer: Self-pay | Admitting: Emergency Medicine

## 2021-10-19 ENCOUNTER — Emergency Department (HOSPITAL_BASED_OUTPATIENT_CLINIC_OR_DEPARTMENT_OTHER): Payer: Medicare Other | Admitting: Radiology

## 2021-10-19 DIAGNOSIS — K440 Diaphragmatic hernia with obstruction, without gangrene: Secondary | ICD-10-CM | POA: Insufficient documentation

## 2021-10-19 DIAGNOSIS — I10 Essential (primary) hypertension: Secondary | ICD-10-CM | POA: Diagnosis not present

## 2021-10-19 DIAGNOSIS — Z950 Presence of cardiac pacemaker: Secondary | ICD-10-CM | POA: Diagnosis not present

## 2021-10-19 DIAGNOSIS — Z20822 Contact with and (suspected) exposure to covid-19: Secondary | ICD-10-CM | POA: Insufficient documentation

## 2021-10-19 DIAGNOSIS — Z8507 Personal history of malignant neoplasm of pancreas: Secondary | ICD-10-CM | POA: Insufficient documentation

## 2021-10-19 DIAGNOSIS — I712 Thoracic aortic aneurysm, without rupture, unspecified: Secondary | ICD-10-CM | POA: Diagnosis not present

## 2021-10-19 DIAGNOSIS — R079 Chest pain, unspecified: Secondary | ICD-10-CM

## 2021-10-19 DIAGNOSIS — Z79899 Other long term (current) drug therapy: Secondary | ICD-10-CM | POA: Insufficient documentation

## 2021-10-19 DIAGNOSIS — K8020 Calculus of gallbladder without cholecystitis without obstruction: Secondary | ICD-10-CM | POA: Diagnosis not present

## 2021-10-19 DIAGNOSIS — I7121 Aneurysm of the ascending aorta, without rupture: Secondary | ICD-10-CM | POA: Diagnosis not present

## 2021-10-19 DIAGNOSIS — R0789 Other chest pain: Secondary | ICD-10-CM | POA: Insufficient documentation

## 2021-10-19 DIAGNOSIS — K449 Diaphragmatic hernia without obstruction or gangrene: Secondary | ICD-10-CM | POA: Diagnosis not present

## 2021-10-19 LAB — CBC
HCT: 38 % — ABNORMAL LOW (ref 39.0–52.0)
Hemoglobin: 12 g/dL — ABNORMAL LOW (ref 13.0–17.0)
MCH: 26.5 pg (ref 26.0–34.0)
MCHC: 31.6 g/dL (ref 30.0–36.0)
MCV: 84.1 fL (ref 80.0–100.0)
Platelets: 177 10*3/uL (ref 150–400)
RBC: 4.52 MIL/uL (ref 4.22–5.81)
RDW: 17.7 % — ABNORMAL HIGH (ref 11.5–15.5)
WBC: 9 10*3/uL (ref 4.0–10.5)
nRBC: 0 % (ref 0.0–0.2)

## 2021-10-19 LAB — HEPATIC FUNCTION PANEL
ALT: 25 U/L (ref 0–44)
AST: 24 U/L (ref 15–41)
Albumin: 3.2 g/dL — ABNORMAL LOW (ref 3.5–5.0)
Alkaline Phosphatase: 105 U/L (ref 38–126)
Bilirubin, Direct: 0.1 mg/dL (ref 0.0–0.2)
Indirect Bilirubin: 0.7 mg/dL (ref 0.3–0.9)
Total Bilirubin: 0.8 mg/dL (ref 0.3–1.2)
Total Protein: 7.1 g/dL (ref 6.5–8.1)

## 2021-10-19 LAB — BASIC METABOLIC PANEL
Anion gap: 8 (ref 5–15)
BUN: 43 mg/dL — ABNORMAL HIGH (ref 8–23)
CO2: 23 mmol/L (ref 22–32)
Calcium: 8.7 mg/dL — ABNORMAL LOW (ref 8.9–10.3)
Chloride: 105 mmol/L (ref 98–111)
Creatinine, Ser: 1.45 mg/dL — ABNORMAL HIGH (ref 0.61–1.24)
GFR, Estimated: 47 mL/min — ABNORMAL LOW (ref >60)
Glucose, Bld: 133 mg/dL — ABNORMAL HIGH (ref 70–99)
Potassium: 3.9 mmol/L (ref 3.5–5.1)
Sodium: 136 mmol/L (ref 135–145)

## 2021-10-19 LAB — TROPONIN I (HIGH SENSITIVITY)
Troponin I (High Sensitivity): 6 ng/L (ref <18)
Troponin I (High Sensitivity): 6 ng/L (ref <18)

## 2021-10-19 LAB — RESP PANEL BY RT-PCR (FLU A&B, COVID) ARPGX2
Influenza A by PCR: NEGATIVE
Influenza B by PCR: NEGATIVE
SARS Coronavirus 2 by RT PCR: NEGATIVE

## 2021-10-19 MED ORDER — IOHEXOL 350 MG/ML SOLN
80.0000 mL | Freq: Once | INTRAVENOUS | Status: AC | PRN
  Administered 2021-10-19: 80 mL via INTRAVENOUS

## 2021-10-19 NOTE — Telephone Encounter (Signed)
Message received on after hours voicemail pt c/o chest pains this nurse returns call and advises pt go to ER for eval and treat family understands and agrees will call with follow-up

## 2021-10-19 NOTE — ED Triage Notes (Signed)
Pt POV reports chest pain starting a few days ago, worse with inspiration. Worse at night.  Pt also c/o fatigue for few days.   Hx of pancreatic and liver cancer. Last tx "months ago"

## 2021-10-19 NOTE — Discharge Instructions (Signed)
Today your blood work, and CAT scan was reassuring.  Your flu and COVID test were negative. If you develop any new or concerning symptoms please seek additional medical care and evaluation.  Please follow-up with your primary care team.

## 2021-10-19 NOTE — ED Provider Notes (Signed)
[facility] EMERGENCY DEPT Provider Note   CSN: [id number] Arrival date & time: 10/19/21  1450     History Chief Complaint  Patient presents with   Chest Pain    Gary [name] is a 87 y.o. male with a past medical medical history of pancreatic cancer metastatic to the liver, malignant ascites, hypertension, hyperlipidemia, pacemaker in place, who presents today for evaluation of intermittent chest pain.  His chest pain started about 3 to 4 days ago.  He has chest pain only when he takes a full deep breath.  He denies any fevers or cough.  He has felt slightly more tired over the past few days however notes nothing abnormal.  His pain does not radiate or move, stays in the middle of his chest.  No abnormal leg swelling, and family at bedside notes that his legs are much smaller than usual.  No nausea or vomiting.  HPI     Past Medical History:  Diagnosis Date   Ascending aortic aneurysm 01/13/2014   stable at 4.7 cm diameter since 2009   Asystole x2, s/p pacemaker 01/13/2014   Bradycardia    permanent pacemaker 11/22/08 St.Jude   Family history of adverse reaction to anesthesia    brother died after anesthesia / nose polyp removal at [facility] (40 years ago)   HTN (hypertension) 01/13/2014   Hyperlipidemia    Hypertension    Malignant ascites    Pacemaker - leads 1987, last generator 2009 St. Jude dual-chamber 01/13/2014   Atrial lead model 433-01, ventricular lead model 431-01 implanted November 1987 Right ventricular lead dysfunction with low impedance and mediocre sensing but satisfactory pacing threshold   Pacemaker lead malfunction 01/13/2014   Chronic low impedance and poor sensing of the ventricular lead   Pancreatic cancer [facility])    Metastatic to liver    Patient Active Problem List   Diagnosis Date Noted   Pancreatic cancer metastasized to liver ([place]) 03/06/2021   Blood pressure decreased 03/06/2021   Pruritus 03/06/2021   Malignant ascites 03/06/2021    Genetic testing 06/23/2020   Port-A-Cath in place 06/12/2020   Goals of care, counseling/discussion 05/16/2020   Cancer of pancreas, tail ([place]) 05/16/2020   Coronary atherosclerosis 07/13/2014   Ascending aortic aneurysm 01/13/2014   Asystole x2, s/p pacemaker 01/13/2014   Pacemaker - leads 1987, last generator 2009 St. Jude dual-chamber 01/13/2014   Pacemaker lead malfunction 01/13/2014   HTN (hypertension) 01/13/2014   Hyperlipidemia 01/13/2014    Past Surgical History:  Procedure Laterality Date   IR IMAGING GUIDED PORT INSERTION  05/24/2020   IR PARACENTESIS  10/26/2020   IR PARACENTESIS  12/12/2020   IR PARACENTESIS  01/22/2021   IR PARACENTESIS  02/02/2021   IR PARACENTESIS  02/14/2021   IR PARACENTESIS  02/23/2021   IR PARACENTESIS  03/06/2021   IR PARACENTESIS  03/14/2021   IR PARACENTESIS  03/22/2021   IR PARACENTESIS  03/29/2021   IR PARACENTESIS  04/06/2021   IR PARACENTESIS  04/17/2021   IR PARACENTESIS  04/27/2021   IR PARACENTESIS  05/09/2021   IR PARACENTESIS  05/22/2021   IR PARACENTESIS  09/07/2021   PERMANENT PACEMAKER GENERATOR CHANGE  11/22/08   ST. Jude   PERMANENT PACEMAKER INSERTION  03/04/97   ST. Jude   US ECHOCARDIOGRAPHY  05/16/11   AS mild to mod.,TR mild to mod., EF => 55%.       Family History  Problem Relation Age of Onset   Ovarian cancer Mother  Heart attack Father     Social History   Tobacco Use   Smoking status: Never   Smokeless tobacco: Never  Vaping Use   Vaping Use: Never used  Substance Use Topics   Alcohol use: Yes    Comment: occas.   Drug use: No    Home Medications Prior to Admission medications   Medication Sig Start Date End Date Taking? Authorizing Provider  DUPIXENT 300 MG/2ML SOPN Inject 300 mg into the skin every 21 ( twenty-one) days. 04/05/21   [provider]  furosemide (LASIX) 20 MG tablet Take 10 mg by mouth every other day. 10 mg daily 10/04/21   Gatha Mayer, MD  omeprazole (PRILOSEC) 20 MG capsule Take  1-2 capsules by mouth daily. 01/22/21   [provider]  spironolactone (ALDACTONE) 25 MG tablet Take 25 mg by mouth every other day. 1 tab daily 09/28/21   Gatha Mayer, MD    Allergies    Amlodipine, Gentamycin [gentamicin sulfate], Hydrocodone, and Aspirin  Review of Systems   Review of Systems  Constitutional:  Negative for chills and fever.  HENT:  Negative for congestion.   Eyes:  Negative for visual disturbance.  Respiratory:  Negative for cough, shortness of breath and wheezing.   Cardiovascular:  Positive for chest pain. Negative for palpitations and leg swelling.  Gastrointestinal:  Negative for abdominal pain, nausea and vomiting.  Genitourinary:  Negative for dysuria.  Musculoskeletal:  Negative for back pain and neck pain.  Skin:  Negative for color change.  Neurological:  Negative for weakness and headaches.  All other systems reviewed and are negative.  Physical Exam Updated Vital Signs BP 131/77   Pulse 82   Temp 98.3 F (36.8 C) (Oral)   Resp (!) 21   Ht 5\' 4"  (1.626 m)   Wt 65.8 kg   SpO2 99%   BMI 24.89 kg/m   Physical Exam Vitals and nursing note reviewed.  Constitutional:      General: He is not in acute distress. HENT:     Head: Atraumatic.  Eyes:     Conjunctiva/sclera: Conjunctivae normal.  Cardiovascular:     Rate and Rhythm: Normal rate and regular rhythm.     Pulses:          Radial pulses are 2+ on the right side and 2+ on the left side.       Posterior tibial pulses are 2+ on the right side and 2+ on the left side.     Heart sounds: Normal heart sounds. No murmur heard. Pulmonary:     Effort: Pulmonary effort is normal. No respiratory distress.     Breath sounds: Normal breath sounds. No decreased breath sounds, wheezing, rhonchi or rales.  Chest:     Chest wall: No deformity or tenderness.  Abdominal:     General: There is no distension.     Palpations: Abdomen is soft.     Tenderness: There is no abdominal tenderness.  There is no guarding.  Musculoskeletal:     Cervical back: Normal range of motion and neck supple.     Right lower leg: No tenderness. No edema.     Left lower leg: No tenderness. No edema.     Comments: No obvious acute injury  Skin:    General: Skin is warm.  Neurological:     Mental Status: He is alert.     Comments: Awake and alert, answers all questions appropriately.  Speech is not slurred.  Psychiatric:  Mood and Affect: Mood normal.        Behavior: Behavior normal.    ED Results / Procedures / Treatments   Labs (all labs ordered are listed, but only abnormal results are displayed) Labs Reviewed  BASIC METABOLIC PANEL - Abnormal; Notable for the following components:      Result Value   Glucose, Bld 133 (*)    BUN 43 (*)    Creatinine, Ser 1.45 (*)    Calcium 8.7 (*)    GFR, Estimated 47 (*)    All other components within normal limits  CBC - Abnormal; Notable for the following components:   Hemoglobin 12.0 (*)    HCT 38.0 (*)    RDW 17.7 (*)    All other components within normal limits  HEPATIC FUNCTION PANEL - Abnormal; Notable for the following components:   Albumin 3.2 (*)    All other components within normal limits  RESP PANEL BY RT-PCR (FLU A&B, COVID) ARPGX2  TROPONIN I (HIGH SENSITIVITY)  TROPONIN I (HIGH SENSITIVITY)    EKG EKG Interpretation  Date/Time:  Friday October 19 2021 15:22:13 EDT Ventricular Rate:  92 PR Interval:  194 QRS Duration: 76 QT Interval:  346 QTC Calculation: 427 R Axis:   25 Text Interpretation: Normal sinus rhythm Nonspecific ST abnormality Abnormal ECG Confirmed by Thamas Jaegers ([id number]) on 10/19/2021 8:46:39 PM  Radiology DG Chest 2 View  Result Date: 10/19/2021 CLINICAL DATA:  Chest pain. EXAM: CHEST - 2 VIEW COMPARISON:  June 15, 2020 FINDINGS: There is stable left-sided venous Port-A-Cath positioning. A dual lead AICD is again seen. There is no evidence of acute infiltrate, pleural effusion or pneumothorax. The  heart size and mediastinal contours are within normal limits. A stable moderate sized hiatal hernia is seen. The visualized skeletal structures are unremarkable. IMPRESSION: No acute cardiopulmonary disease. Electronically Signed   By: Virgina Norfolk M.D.   On: 10/19/2021 16:04   CT Angio Chest PE W and/or Wo Contrast  Result Date: 10/19/2021 CLINICAL DATA:  Chest pain for 2 days, initial encounter EXAM: CT ANGIOGRAPHY CHEST WITH CONTRAST TECHNIQUE: Multidetector CT imaging of the chest was performed using the standard protocol during bolus administration of intravenous contrast. Multiplanar CT image reconstructions and MIPs were obtained to evaluate the vascular anatomy. CONTRAST:  28mL OMNIPAQUE IOHEXOL 350 MG/ML SOLN COMPARISON:  Chest x-ray from earlier in the same day, CT from 04/12/2020. FINDINGS: Cardiovascular: Atherosclerotic calcifications of the thoracic aorta are noted. Dilatation of the ascending aorta to 4.8 cm is noted relatively stable in appearance from the prior exam. The degree of opacification of the aorta is limited limiting evaluation for possible dissection. The pulmonary artery shows a normal branching pattern without focal filling defect to suggest pulmonary embolism. Coronary calcifications are noted. No significant cardiac enlargement is seen. Left chest wall port is noted. Mediastinum/Nodes: Thoracic inlet is within normal limits. No sizable hilar or mediastinal adenopathy is noted. The esophagus as visualized is within normal limits. A large hiatal hernia is noted with approximately 50% of the stomach within the chest cavity. This is stable in appearance from the prior exam. Lungs/Pleura: Lungs are well aerated bilaterally. No focal infiltrate or sizable effusion is seen. No pneumothorax is noted. No sizable parenchymal nodule is seen. Upper Abdomen: Liver is shrunken consistent with underlying cirrhotic change. Multiple hypodensities are identified throughout the liver similar to  that seen on prior exams a portion of which represents cysts although old metastatic lesions deserve consideration as well. The overall  appearance is similar to that seen on 08/31/2021. Moderate ascites is noted. The known pancreatic lesion in the tail is not well appreciated on today's exam due to the timing of the contrast bolus. Calcified gallstones are again seen without complicating factors. Musculoskeletal: Degenerative change of the thoracic spine is noted. No metastatic lesions are noted. Review of the MIP images confirms the above findings. IMPRESSION: No evidence of pulmonary emboli. Relatively stable dilatation of the thoracic aorta to 4.8 cm. Ascending thoracic aortic aneurysm. Recommend semi-annual imaging followup by CTA or MRA and referral to cardiothoracic surgery if not already obtained. This recommendation follows 2010 ACCF/AHA/AATS/ACR/ASA/SCA/SCAI/SIR/STS/SVM Guidelines for the Diagnosis and Management of Patients With Thoracic Aortic Disease. Circulation. 2010; 121: W295-A213. Aortic aneurysm NOS (ICD10-I71.9) Large hiatal hernia stable from the prior exam. Cirrhotic changes of the liver with associated ascites. Scattered hypodensities are again seen throughout the liver stable from the recent CT from 08/31/2021. the majority of these represent simple cysts although the possibility of old metastatic lesions cannot be totally excluded. Again stability from September of this year is noted. Previously seen pancreatic tail lesion is not well appreciated on this exam. Aortic Atherosclerosis (ICD10-I70.0). Electronically Signed   By: Inez Catalina M.D.   On: 10/19/2021 20:17    Procedures Procedures   Medications Ordered in ED Medications  iohexol (OMNIPAQUE) 350 MG/ML injection 80 mL (80 mLs Intravenous Contrast Given 10/19/21 1940)    ED Course  I have reviewed the triage vital signs and the nursing notes.  Pertinent labs & imaging results that were available during my care of the patient  were reviewed by me and considered in my medical decision making (see chart for details).    MDM Rules/Calculators/A&P                          Patient is a 87 year old gentleman who presents today for evaluation of 3 to 4 days of pleuritic chest pain.  EKG is obtained without ischemia, troponin x2 is not elevated, doubt ACS.  Chest x-ray shows AICD in place, no acute cardiopulmonary disease noted.  Labs are obtained and reviewed, minimal anemia with a hemoglobin at 12, BMP is consistent with patient's baseline.  Hepatic function panel is normal aside from his hypoalbuminemia which appears consistent with his baseline. Flu A, flu B, and COVID testing is negative.  He is 99 to 100% on room air.  He does have a known thoracic aorta with aneurysm, and given that he has known metastatic cancer and in the setting of pleuritic chest pain high suspicion for PE. CTA PE study was obtained without evidence of PE found.  He does have cirrhotic liver changes and ascites which are both known along with stable scattered hypodensities which is being followed already.  His aneurysm is not significantly changed in size since prior scans.    His scans do show a large hiatal hernia which may be contributing to his pain.  I discussed conservative care options for this with patient and his son who is at bedside.  Patient does have cardiac risk factors, however given that the pain is only pleuritic with negative troponin x2, reassuring vital signs this does not appear to be ACS.  Recommended GERD treatment and conservative care along with outpatient follow-up.  Return precautions were discussed with patient who states their understanding.  At the time of discharge patient denied any unaddressed complaints or concerns.  Patient is agreeable for discharge home.  Note: Portions  of this report may have been transcribed using voice recognition software. Every effort was made to ensure accuracy; however, inadvertent  computerized transcription errors may be present  Final Clinical Impression(s) / ED Diagnoses Final diagnoses:  Chest pain, unspecified type  Hiatal hernia    Rx / DC Orders ED Discharge Orders     None        Ollen Gross 10/20/21 0019    Luna Fuse, MD 10/27/21 [phone]

## 2021-10-23 ENCOUNTER — Telehealth: Payer: Self-pay | Admitting: General Practice

## 2021-10-23 NOTE — Telephone Encounter (Signed)
[place] CSW Progress Notes  Call to daughter, Bethena Roys, to discuss options for support for her in light of her father's serious health condition.  Offered one session to discuss and help her find plan for additional outside support.  She would like to be seen at the [facility] location; there is no in person coverage at this location at this time.  After discussion, seems she would like to establish a more long term relationship with provider who is either free or covered under her [place].  Advised her of caregiver support group offered through [employer].  Provided several options obtained through search of [facility] provider database, also suggested [facility] as they also provide free counseling for caregivers/family members of seriously ill individuals.  She will call back if she has any further needs.    Edwyna Shell, LCSW Clinical Social Worker Phone:  [phone]

## 2021-10-24 ENCOUNTER — Telehealth: Payer: Self-pay | Admitting: Internal Medicine

## 2021-10-24 NOTE — Telephone Encounter (Signed)
Patient was to have BMET tomorrow - was in ED w/ chest pain thought to be from large hiatal hernia  BMET done  kidney fx stable  I have recommended that this BMEt be done in about 1 week instead

## 2021-10-30 ENCOUNTER — Inpatient Hospital Stay (HOSPITAL_BASED_OUTPATIENT_CLINIC_OR_DEPARTMENT_OTHER): Payer: Medicare Other | Admitting: Nurse Practitioner

## 2021-10-30 ENCOUNTER — Encounter: Payer: Self-pay | Admitting: Nurse Practitioner

## 2021-10-30 ENCOUNTER — Other Ambulatory Visit: Payer: Self-pay

## 2021-10-30 ENCOUNTER — Inpatient Hospital Stay: Payer: Medicare Other

## 2021-10-30 ENCOUNTER — Other Ambulatory Visit: Payer: Self-pay | Admitting: Internal Medicine

## 2021-10-30 ENCOUNTER — Inpatient Hospital Stay: Payer: Medicare Other | Attending: Oncology

## 2021-10-30 VITALS — BP 144/72 | HR 72 | Temp 97.9°F | Resp 18 | Ht 64.0 in | Wt 148.2 lb

## 2021-10-30 DIAGNOSIS — Z452 Encounter for adjustment and management of vascular access device: Secondary | ICD-10-CM | POA: Insufficient documentation

## 2021-10-30 DIAGNOSIS — C787 Secondary malignant neoplasm of liver and intrahepatic bile duct: Secondary | ICD-10-CM | POA: Diagnosis not present

## 2021-10-30 DIAGNOSIS — R188 Other ascites: Secondary | ICD-10-CM | POA: Insufficient documentation

## 2021-10-30 DIAGNOSIS — C252 Malignant neoplasm of tail of pancreas: Secondary | ICD-10-CM | POA: Diagnosis not present

## 2021-10-30 DIAGNOSIS — I7121 Aneurysm of the ascending aorta, without rupture: Secondary | ICD-10-CM | POA: Insufficient documentation

## 2021-10-30 DIAGNOSIS — E785 Hyperlipidemia, unspecified: Secondary | ICD-10-CM | POA: Diagnosis not present

## 2021-10-30 DIAGNOSIS — I10 Essential (primary) hypertension: Secondary | ICD-10-CM | POA: Diagnosis not present

## 2021-10-30 DIAGNOSIS — Z95828 Presence of other vascular implants and grafts: Secondary | ICD-10-CM

## 2021-10-30 LAB — CMP (CANCER CENTER ONLY)
ALT: 14 U/L (ref 0–44)
AST: 17 U/L (ref 15–41)
Albumin: 3.5 g/dL (ref 3.5–5.0)
Alkaline Phosphatase: 98 U/L (ref 38–126)
Anion gap: 8 (ref 5–15)
BUN: 42 mg/dL — ABNORMAL HIGH (ref 8–23)
CO2: 22 mmol/L (ref 22–32)
Calcium: 8.9 mg/dL (ref 8.9–10.3)
Chloride: 107 mmol/L (ref 98–111)
Creatinine: 1.33 mg/dL — ABNORMAL HIGH (ref 0.61–1.24)
GFR, Estimated: 52 mL/min — ABNORMAL LOW (ref >60)
Glucose, Bld: 93 mg/dL (ref 70–99)
Potassium: 4 mmol/L (ref 3.5–5.1)
Sodium: 137 mmol/L (ref 135–145)
Total Bilirubin: 0.6 mg/dL (ref 0.3–1.2)
Total Protein: 7.4 g/dL (ref 6.5–8.1)

## 2021-10-30 IMAGING — US IR PARACENTESIS
1 series · 3 of 3 positions shown · non-contrast
Comparison: none

INDICATION: Recurrent ascites.  Request for therapeutic paracenteses.

[Series 1: ir (id) (id)/(id)/(id) ir · 3 of 3 slices shown]
[im 1/3]
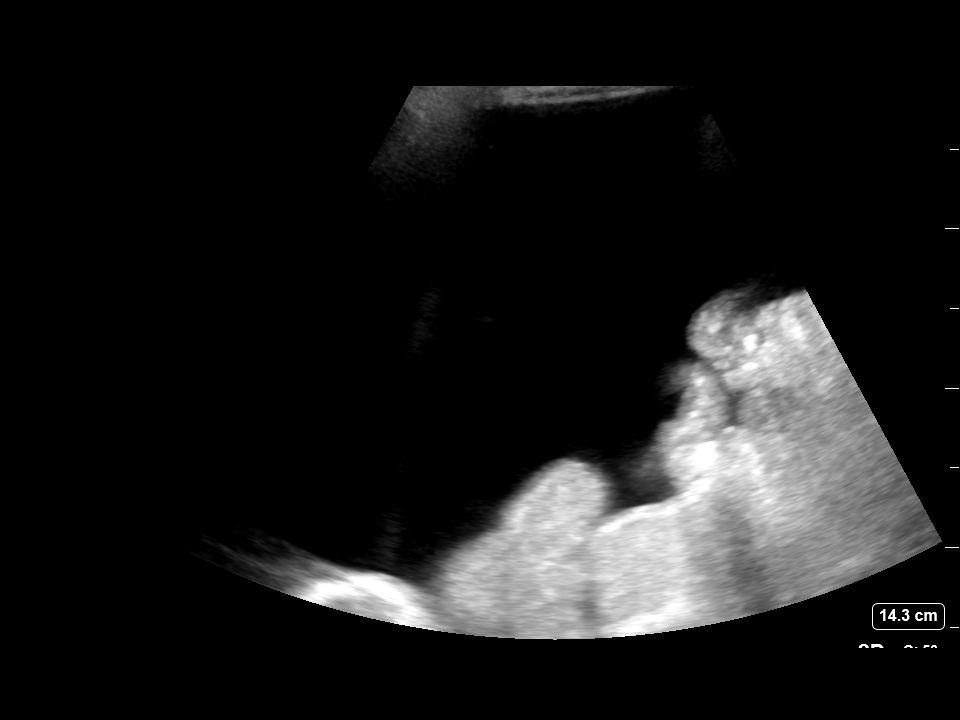
[im 2/3]
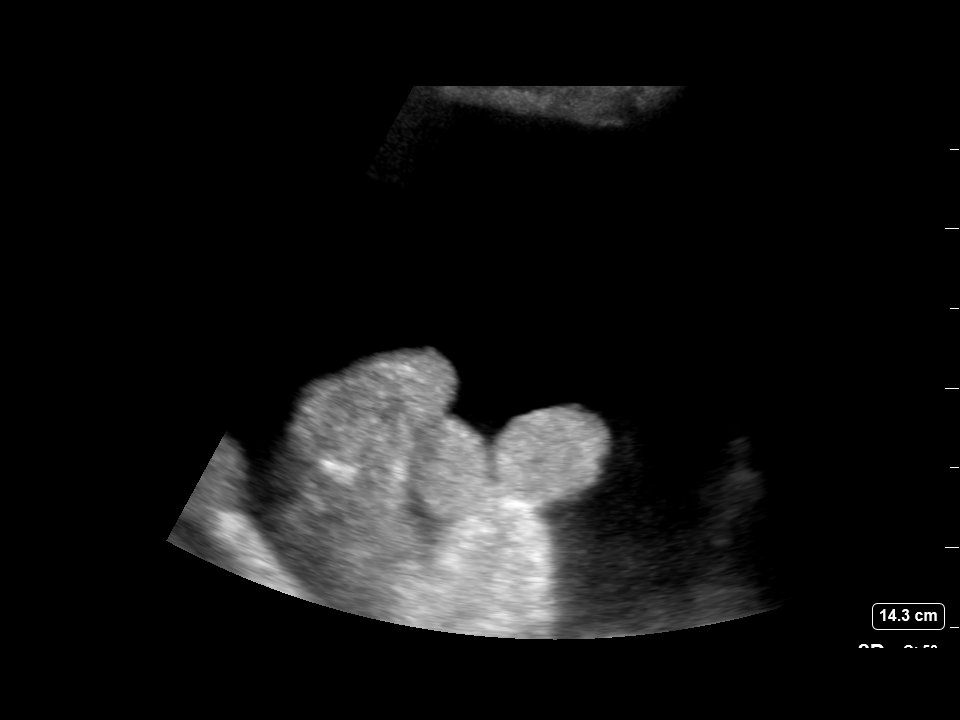
[im 3/3]
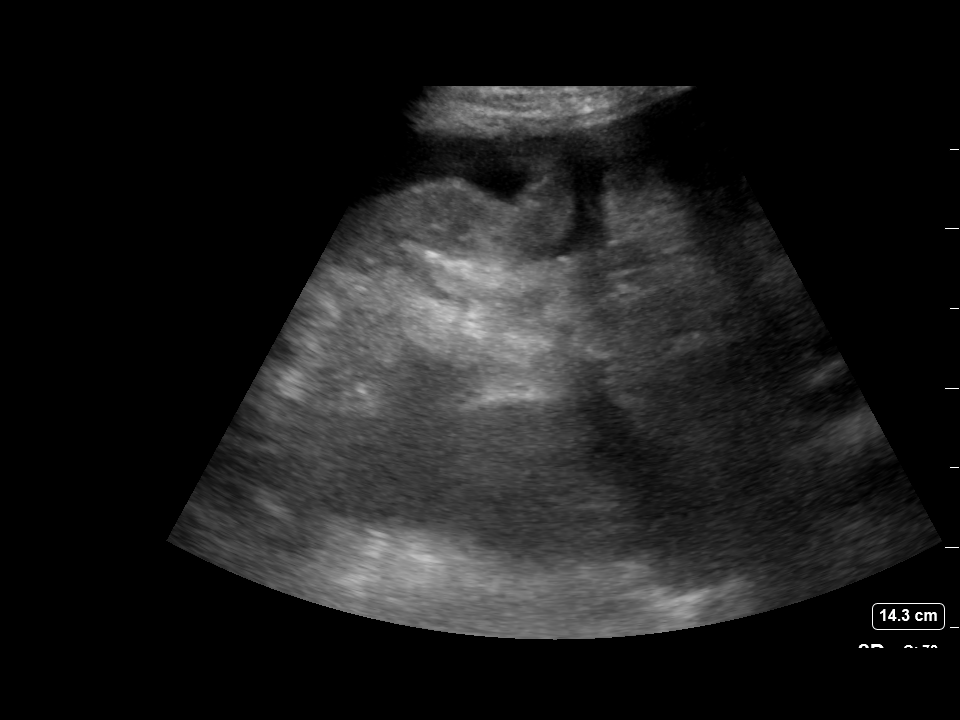

[3 of 3 positions shown; findings below may reference images not displayed]

EXAM:
ULTRASOUND GUIDED THERAPEUTIC PARACENTESIS

MEDICATIONS:
10 mL 1% lidocaine

COMPLICATIONS:
None immediate.

PROCEDURE:
Informed written consent was obtained from the patient after a
discussion of the risks, benefits and alternatives to treatment. A
timeout was performed prior to the initiation of the procedure.

Initial ultrasound scanning demonstrates a large amount of ascites
within the right lower abdominal quadrant. The right lower abdomen
was prepped and draped in the usual sterile fashion. 1% lidocaine
was used for local anesthesia.

Following this, a 19 gauge, 7-cm, Yueh catheter was introduced. An
ultrasound image was saved for documentation purposes. The
paracentesis was performed. The catheter was removed and a dressing
was applied. The patient tolerated the procedure well without
immediate post procedural complication.
FINDINGS: A total of approximately 4 L of clear yellow fluid was removed.
IMPRESSION: Successful ultrasound-guided paracentesis yielding 4 liters of
peritoneal fluid.

## 2021-10-30 MED ORDER — SODIUM CHLORIDE 0.9% FLUSH
10.0000 mL | INTRAVENOUS | Status: DC | PRN
  Administered 2021-10-30: 10 mL

## 2021-10-30 MED ORDER — HEPARIN SOD (PORK) LOCK FLUSH 100 UNIT/ML IV SOLN
500.0000 [IU] | Freq: Once | INTRAVENOUS | Status: AC | PRN
  Administered 2021-10-30: 500 [IU]

## 2021-10-30 NOTE — Telephone Encounter (Signed)
25 mg qod dose sent

## 2021-10-30 NOTE — Progress Notes (Signed)
[place] OFFICE PROGRESS NOTE   Diagnosis: Pancreas cancer  INTERVAL HISTORY:   [name] returns as scheduled.  He overall feels well.  Describes appetite as "60%".  He denies pain.  Objective:  Vital signs in last 24 hours:  Blood pressure (!) 144/72, pulse 72, temperature 97.9 F (36.6 C), temperature source Oral, resp. rate 18, height 5\' 4"  (1.626 m), weight 148 lb 3.2 oz (67.2 kg), SpO2 100 %.    HEENT: No thrush or ulcers. Resp: Lungs clear bilaterally. Cardio: Regular rate and rhythm. GI: Abdomen is soft, mildly distended.  No hepatomegaly.  No mass. Vascular: Very minimal lower leg edema bilaterally. Port-A-Cath without erythema.  Lab Results:  Lab Results  Component Value Date   WBC 9.0 10/19/2021   HGB 12.0 (L) 10/19/2021   HCT 38.0 (L) 10/19/2021   MCV 84.1 10/19/2021   PLT 177 10/19/2021   NEUTROABS 6.2 08/02/2021    Imaging:  No results found.  Medications: I have reviewed the patient's current medications.  Assessment/Plan: Pancreas tail lesion, multiple liver lesions Chest CT 04/12/2020-ascending thoracic aortic aneurysm; hypovascular lesion tail of the pancreas, new compared to the prior examination.  Multiple poorly defined hypovascular lesions scattered throughout the liver CT abdomen/pelvis 05/02/2020-hypoenhancing lesion of the pancreatic tail measuring approximately 2.4 x 1.9 cm; enlarged portacaval lymph nodes measuring up to 1.9 x 1.4 cm; numerous hypodense lesions throughout the liver, index lesion of the liver dome measuring 1.7 x 1.2 cm. Ultrasound-guided biopsy of a left liver lesion 05/10/2020-poorly differentiated carcinoma consistent with a pancreatobiliary primary Markedly elevated CA 19-9  Cycle 1 day 1 gemcitabine/Abraxane 05/26/2020 Cycle 1 day 15 gemcitabine/Abraxane 06/12/2020 Cycle 2 day 1 gemcitabine/Abraxane 07/04/2020 (chemotherapy doses reduced, antiemetic regimen adjusted) Cycle 2 day 15 Gemcitabine/Abraxane  07/18/2020 (continue same dose reduction, reduce post-treatment steroids due to insomnia) Cycle 3 day 1 gem/abraxane 08/02/20 Cycle 3 day 15 gem/Abraxane 08/15/2020 CTs abdomen/pelvis 08/25/2020-pancreas tail mass and portacaval lymphadenopathy decreased.  Liver metastases reported as increased, further review with no significant change in the majority of liver lesions and a decrease in the size of a few lesions, no new margin lesions Cycle 4-day 1 gemcitabine/Abraxane 09/05/2020 Paracentesis 09/26/2020-reactive mesothelial cells Paracentesis 10/09/2020-reactive mesothelial cells Cycle 5 gemcitabine/Abraxane 10/12/2020 Cycle 6 gemcitabine/Abraxane 11/01/2020 Cycle 7 gemcitabine/Abraxane 11/22/2020 Cycle 8 gemcitabine/Abraxane 12/13/2020 Cycle 9 gemcitabine/Abraxane 01/03/2021 CT abdomen/pelvis 01/29/2021-decrease in pancreas tail mass, decrease in size of multiple hepatic metastases, decreased size of hepatoduodenal ligament lymph nodes, progressive ascites Cycle 10 gemcitabine/Abraxane 01/31/2021 CT abdomen/pelvis 04/13/2021- generally stable liver lesions, some have slightly decreased and others slightly increased compared to the CT 01/29/2021, pancreas mass not appreciated, large volume ascites CT abdomen/pelvis 08/02/2021-multiple liver lesions-some are unchanged, fluid density lesions are present-some larger-treated metastases?,  Increased small right pleural effusion, ascites, tail of pancreas lesion Not measurable CT abdomen/pelvis 08/31/2021-increased size of right inguinal hernia containing ascites, unchanged ascites and small right pleural effusion, stable liver lesions, no pancreas mass   Thoracic aortic aneurysm Hypertension Hyperlipidemia History of bradycardia status post permanent pacemaker Mother had cervical cancer Pitting lower leg edema GERD, on omeprazole  Right upper back inflamed sebaceous cyst-culture from cyst drainage 10/23/2020-2 staph species, antibiotic changed to ciprofloxacin  10/27/2020, status post I&D procedure 11/17/2020 Refractory ascites-trial of spironolactone 03/23/2021; spironolactone and Lasix 04/04/2021--improved 06/21/2021    Disposition: Gary [name] appears stable.  There is no clinical evidence of disease progression.  We will follow-up on the CA 19-9 from today.  Plan to continue to follow with observation.  He continues follow-up with Dr. Carlean Purl for management of ascites.  He will return for a Port-A-Cath flush and follow-up visit in 6 weeks.  Patient seen with Dr. Benay [name].    Gary [name] ANP/GNP-BC   10/30/2021  2:17 PM  This was a shared visit with Gary [name].  [name] has experienced significant improvement in ascites since starting the diuretic regimen with Dr. Carlean Purl.  No clinical evidence for progression of the pancreas cancer.  The plan is to continue observation.  I was present for greater than 50% of today's visit.  I performed medical decision making.  Gary Manson, MD

## 2021-10-30 NOTE — Telephone Encounter (Signed)
I want to confirm that I didn't miss anything. Looks like his current dose of the spirolactone 25mg  tablets is supposed to be one every other day? To take with his lasix.

## 2021-10-30 NOTE — Patient Instructions (Signed)

## 2021-10-31 ENCOUNTER — Telehealth: Payer: Self-pay | Admitting: Internal Medicine

## 2021-10-31 ENCOUNTER — Encounter: Payer: Self-pay | Admitting: Oncology

## 2021-10-31 LAB — CANCER ANTIGEN 19-9: CA 19-9: 744 U/mL — ABNORMAL HIGH (ref 0–35)

## 2021-10-31 NOTE — Telephone Encounter (Signed)
Daughters message me in reference to plan for upcoming BM ET.  He had kidney function tested yesterday at oncology and I have looked at that.  His kidney function is mildly abnormal and better than it was.  He is to continue on current diuretic regimen of every other day treatment and keep follow-up as planned.  Both daughters updated.

## 2021-11-05 DIAGNOSIS — L2089 Other atopic dermatitis: Secondary | ICD-10-CM | POA: Diagnosis not present

## 2021-11-12 ENCOUNTER — Encounter: Payer: Self-pay | Admitting: Oncology

## 2021-11-12 ENCOUNTER — Other Ambulatory Visit (HOSPITAL_BASED_OUTPATIENT_CLINIC_OR_DEPARTMENT_OTHER): Payer: Self-pay

## 2021-11-12 ENCOUNTER — Other Ambulatory Visit: Payer: Self-pay

## 2021-11-12 ENCOUNTER — Ambulatory Visit: Payer: Medicare Other | Attending: Internal Medicine

## 2021-11-12 DIAGNOSIS — Z23 Encounter for immunization: Secondary | ICD-10-CM | POA: Diagnosis not present

## 2021-11-12 MED ORDER — PFIZER COVID-19 VAC BIVALENT 30 MCG/0.3ML IM SUSP
INTRAMUSCULAR | 0 refills | Status: DC
  Filled 2021-11-12: qty 0.3, 1d supply, fill #0

## 2021-11-12 NOTE — Progress Notes (Signed)
   Covid-19 Vaccination Clinic  Name:  Gary [name]    MRN: [id number] DOB: 30-Jun-1932  11/12/2021  Gary [name] was observed post Covid-19 immunization for 15 minutes without incident. He was provided with Vaccine Information Sheet and instruction to access the V-Safe system.   Gary [name] was instructed to call 911 with any severe reactions post vaccine: Difficulty breathing  Swelling of face and throat  A fast heartbeat  A bad rash all over body  Dizziness and weakness   Immunizations Administered     Name Date Dose VIS Date Route   Pfizer Covid-19 Vaccine Bivalent Booster 11/12/2021  2:21 PM 0.3 mL 08/15/2021 Intramuscular   Manufacturer: [employer]   Lot: [id number]   [place]: [phone]

## 2021-11-13 ENCOUNTER — Encounter: Payer: Self-pay | Admitting: Internal Medicine

## 2021-11-13 ENCOUNTER — Ambulatory Visit (INDEPENDENT_AMBULATORY_CARE_PROVIDER_SITE_OTHER): Payer: Medicare Other | Admitting: Internal Medicine

## 2021-11-13 VITALS — BP 110/72 | HR 70 | Ht 65.0 in | Wt 149.6 lb

## 2021-11-13 DIAGNOSIS — C787 Secondary malignant neoplasm of liver and intrahepatic bile duct: Secondary | ICD-10-CM

## 2021-11-13 DIAGNOSIS — R188 Other ascites: Secondary | ICD-10-CM

## 2021-11-13 DIAGNOSIS — K4090 Unilateral inguinal hernia, without obstruction or gangrene, not specified as recurrent: Secondary | ICD-10-CM

## 2021-11-13 DIAGNOSIS — N179 Acute kidney failure, unspecified: Secondary | ICD-10-CM | POA: Diagnosis not present

## 2021-11-13 DIAGNOSIS — C259 Malignant neoplasm of pancreas, unspecified: Secondary | ICD-10-CM

## 2021-11-13 NOTE — Patient Instructions (Signed)
Great to see you today.    I appreciate the opportunity to care for you. Carl Gessner, MD, FACG 

## 2021-11-13 NOTE — Progress Notes (Signed)
Gary [name] 87 y.o. 1932-09-06 [id number]  Assessment & Plan:   Encounter Diagnoses  Name Primary?   Other ascites Yes   Right inguinal hernia    Acute kidney injury [facility])    Pancreatic cancer metastasized to liver [facility])    Continue current treatment plan with every other day low-dose diuretics.  See me again in January.  He will have follow-up labs through oncology in late December.  The patient knows to contact me if he is having increasing symptoms from his ascites and we can consider scheduling another paracentesis.    CC: [name], MD    Subjective:   Chief Complaint: Nonmalignant ascites in the setting of pancreatic cancer  HPI 87 year old white man with a history of pancreatic cancer the tail metastasized to the liver with ascites likely related to cirrhotic liver physiology.  He has been treated with diuretics and paracenteses. He has had problems with fluctuating renal function and acute kidney injury issues attributable to diuretics and we have adjusted his diuretic doses downward and follow-up with primary care recently he was challenged to eat more to try to gain some muscle mass and he has been doing this.  Lab Results  Component Value Date   CREATININE 1.33 (H) 10/30/2021   BUN 42 (H) 10/30/2021   NA 137 10/30/2021   K 4.0 10/30/2021   CL 107 10/30/2021   CO2 22 10/30/2021    CA 19-9 went from 6 56-7 44 08/02/2021 to 10/30/2021.  He remains in observation  Emergency department visit for chest pain 10/19/2021 thought hiatal hernia and reflux related, large hiatal hernia seen on chest CT that was negative for pulmonary emboli, liver looked cirrhotic Last paracentesis September 07, 2021, 2 L removed   Wt Readings from Last 3 Encounters:  11/13/21 149 lb 9.6 oz (67.9 kg)  10/30/21 148 lb 3.2 oz (67.2 kg)  10/19/21 145 lb (65.8 kg)   He brings a list of weights from November 14 through November 29.  November 14 he was 148 pounds November 29  149.6 on his home scale.  Weights have ranged from a low of 145.1-150.5.  The hernia is not bothering him.  Some pain in the hips.  He is working out with an Air traffic controller and that is helping.  He is moving around better though he still uses his walker.  He is describing some itchiness on the head but no rash.  He has dermatology follow-up pending soon. Allergies  Allergen Reactions   Amlodipine Other (See Comments)   Gentamycin [Gentamicin Sulfate]     "Inner ears problems after Gent infusion" , also "unsteady mobility"   Hydrocodone    Aspirin Other (See Comments)    High doses cause stomach upset   Current Meds  Medication Sig   COVID-19 mRNA bivalent vaccine, Pfizer, (PFIZER COVID-19 VAC BIVALENT) injection Inject into the muscle.   DUPIXENT 300 MG/2ML SOPN Inject 300 mg into the skin every 21 ( twenty-one) days.   furosemide (LASIX) 20 MG tablet Take 10 mg by mouth every other day. 10 mg daily   omeprazole (PRILOSEC) 20 MG capsule Take 1-2 capsules by mouth daily.   spironolactone (ALDACTONE) 25 MG tablet Take 1 tablet (25 mg total) by mouth every other day.   Past Medical History:  Diagnosis Date   Ascending aortic aneurysm 01/13/2014   stable at 4.7 cm diameter since 2009   Asystole x2, s/p pacemaker 01/13/2014   Bradycardia    permanent pacemaker 11/22/08  St.Jude   Family history of adverse reaction to anesthesia    brother died after anesthesia / nose polyp removal at [facility] (40 years ago)   HTN (hypertension) 01/13/2014   Hyperlipidemia    Hypertension    Malignant ascites    Pacemaker - leads 1987, last generator 2009 St. Jude dual-chamber 01/13/2014   Atrial lead model 433-01, ventricular lead model 431-01 implanted November 1987 Right ventricular lead dysfunction with low impedance and mediocre sensing but satisfactory pacing threshold   Pacemaker lead malfunction 01/13/2014   Chronic low impedance and poor sensing of the ventricular lead   Pancreatic cancer ([place])     Metastatic to liver   Past Surgical History:  Procedure Laterality Date   IR IMAGING GUIDED PORT INSERTION  05/24/2020   IR PARACENTESIS  10/26/2020   IR PARACENTESIS  12/12/2020   IR PARACENTESIS  01/22/2021   IR PARACENTESIS  02/02/2021   IR PARACENTESIS  02/14/2021   IR PARACENTESIS  02/23/2021   IR PARACENTESIS  03/06/2021   IR PARACENTESIS  03/14/2021   IR PARACENTESIS  03/22/2021   IR PARACENTESIS  03/29/2021   IR PARACENTESIS  04/06/2021   IR PARACENTESIS  04/17/2021   IR PARACENTESIS  04/27/2021   IR PARACENTESIS  05/09/2021   IR PARACENTESIS  05/22/2021   IR PARACENTESIS  09/07/2021   PERMANENT PACEMAKER GENERATOR CHANGE  11/22/08   ST. Jude   PERMANENT PACEMAKER INSERTION  03/04/97   ST. Jude   US ECHOCARDIOGRAPHY  05/16/11   AS mild to mod.,TR mild to mod., EF => 55%.   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   Review of Systems As above  Objective:   Physical Exam BP 110/72   Pulse 70   Ht 5\' 5"  (2.876 m)   Wt 149 lb 9.6 oz (67.9 kg)   BMI 24.89 kg/m  Elderly man NAD - looking more robust Lungs cta Cor NL S1S2 no rmg Abd mild-mod ascites, soft reducible right inguinal hernia also Alert ad oriented x 3 Ambulates to exam table w/ assist when climbing

## 2021-11-19 DIAGNOSIS — H838X3 Other specified diseases of inner ear, bilateral: Secondary | ICD-10-CM | POA: Diagnosis not present

## 2021-11-19 DIAGNOSIS — H6123 Impacted cerumen, bilateral: Secondary | ICD-10-CM | POA: Diagnosis not present

## 2021-11-19 DIAGNOSIS — H903 Sensorineural hearing loss, bilateral: Secondary | ICD-10-CM | POA: Diagnosis not present

## 2021-11-21 DIAGNOSIS — L2089 Other atopic dermatitis: Secondary | ICD-10-CM | POA: Diagnosis not present

## 2021-12-05 DIAGNOSIS — L2089 Other atopic dermatitis: Secondary | ICD-10-CM | POA: Diagnosis not present

## 2021-12-05 DIAGNOSIS — B353 Tinea pedis: Secondary | ICD-10-CM | POA: Diagnosis not present

## 2021-12-11 ENCOUNTER — Inpatient Hospital Stay: Payer: Medicare Other

## 2021-12-11 ENCOUNTER — Inpatient Hospital Stay: Payer: Medicare Other | Attending: Oncology | Admitting: Oncology

## 2021-12-11 ENCOUNTER — Other Ambulatory Visit: Payer: Self-pay

## 2021-12-11 VITALS — BP 144/78 | HR 68 | Temp 98.1°F | Resp 18 | Ht 65.0 in | Wt 149.8 lb

## 2021-12-11 DIAGNOSIS — J90 Pleural effusion, not elsewhere classified: Secondary | ICD-10-CM | POA: Diagnosis not present

## 2021-12-11 DIAGNOSIS — Z95828 Presence of other vascular implants and grafts: Secondary | ICD-10-CM

## 2021-12-11 DIAGNOSIS — C252 Malignant neoplasm of tail of pancreas: Secondary | ICD-10-CM | POA: Insufficient documentation

## 2021-12-11 DIAGNOSIS — I10 Essential (primary) hypertension: Secondary | ICD-10-CM | POA: Insufficient documentation

## 2021-12-11 DIAGNOSIS — C787 Secondary malignant neoplasm of liver and intrahepatic bile duct: Secondary | ICD-10-CM | POA: Diagnosis not present

## 2021-12-11 DIAGNOSIS — Z452 Encounter for adjustment and management of vascular access device: Secondary | ICD-10-CM | POA: Diagnosis not present

## 2021-12-11 DIAGNOSIS — E785 Hyperlipidemia, unspecified: Secondary | ICD-10-CM | POA: Insufficient documentation

## 2021-12-11 LAB — CMP (CANCER CENTER ONLY)
ALT: 18 U/L (ref 0–44)
AST: 22 U/L (ref 15–41)
Albumin: 3.7 g/dL (ref 3.5–5.0)
Alkaline Phosphatase: 95 U/L (ref 38–126)
Anion gap: 8 (ref 5–15)
BUN: 35 mg/dL — ABNORMAL HIGH (ref 8–23)
CO2: 25 mmol/L (ref 22–32)
Calcium: 9 mg/dL (ref 8.9–10.3)
Chloride: 105 mmol/L (ref 98–111)
Creatinine: 1.48 mg/dL — ABNORMAL HIGH (ref 0.61–1.24)
GFR, Estimated: 46 mL/min — ABNORMAL LOW (ref >60)
Glucose, Bld: 99 mg/dL (ref 70–99)
Potassium: 4.1 mmol/L (ref 3.5–5.1)
Sodium: 138 mmol/L (ref 135–145)
Total Bilirubin: 0.6 mg/dL (ref 0.3–1.2)
Total Protein: 7.5 g/dL (ref 6.5–8.1)

## 2021-12-11 MED ORDER — SODIUM CHLORIDE 0.9% FLUSH
10.0000 mL | INTRAVENOUS | Status: DC | PRN
  Administered 2021-12-11: 14:00:00 10 mL

## 2021-12-11 MED ORDER — HEPARIN SOD (PORK) LOCK FLUSH 100 UNIT/ML IV SOLN
500.0000 [IU] | Freq: Once | INTRAVENOUS | Status: AC | PRN
  Administered 2021-12-11: 14:00:00 500 [IU]

## 2021-12-11 NOTE — Progress Notes (Signed)
Gary [name]   Diagnosis: Pancreas cancer  INTERVAL HISTORY:   Gary [name] returns as scheduled.  No new complaint.  He is participating in physical therapy via Zoom.  Good appetite.  He continues diuretic therapy as directed by Gary [name].  He last underwent a paracentesis in September.  Objective:  Vital signs in last 24 hours:  Blood pressure (!) 144/78, pulse 68, temperature 98.1 F (36.7 C), temperature source Oral, resp. rate 18, height 5\' 5"  (1.651 m), weight 149 lb 12.8 oz (67.9 kg), SpO2 100 %.    Resp: Scattered inspiratory bronchial sounds, no respiratory distress Cardio: Regular rate and rhythm GI: No hepatomegaly, no mass, nontender, no apparent ascites Vascular: No leg edema  Portacath/PICC-without erythema  Lab Results:  Lab Results  Component Value Date   WBC 9.0 10/19/2021   HGB 12.0 (L) 10/19/2021   HCT 38.0 (L) 10/19/2021   MCV 84.1 10/19/2021   PLT 177 10/19/2021   NEUTROABS 6.2 08/02/2021    CMP  Lab Results  Component Value Date   NA 138 12/11/2021   K 4.1 12/11/2021   CL 105 12/11/2021   CO2 25 12/11/2021   GLUCOSE 99 12/11/2021   BUN 35 (H) 12/11/2021   CREATININE 1.48 (H) 12/11/2021   CALCIUM 9.0 12/11/2021   PROT 7.5 12/11/2021   ALBUMIN 3.7 12/11/2021   AST 22 12/11/2021   ALT 18 12/11/2021   ALKPHOS 95 12/11/2021   BILITOT 0.6 12/11/2021   GFRNONAA 46 (L) 12/11/2021   GFRAA >60 09/05/2020    Lab Results  Component Value Date   [id number] 744 (H) 10/30/2021     Medications: I have reviewed the patient's current medications.   Assessment/Plan: Pancreas tail lesion, multiple liver lesions Chest CT 04/12/2020-ascending thoracic aortic aneurysm; hypovascular lesion tail of the pancreas, new compared to the prior examination.  Multiple poorly defined hypovascular lesions scattered throughout the liver CT abdomen/pelvis 05/02/2020-hypoenhancing lesion of the pancreatic tail measuring  approximately 2.4 x 1.9 cm; enlarged portacaval lymph nodes measuring up to 1.9 x 1.4 cm; numerous hypodense lesions throughout the liver, index lesion of the liver dome measuring 1.7 x 1.2 cm. Ultrasound-guided biopsy of a left liver lesion 05/10/2020-poorly differentiated carcinoma consistent with a pancreatobiliary primary Markedly elevated CA 19-9  Cycle 1 day 1 gemcitabine/Abraxane 05/26/2020 Cycle 1 day 15 gemcitabine/Abraxane 06/12/2020 Cycle 2 day 1 gemcitabine/Abraxane 07/04/2020 (chemotherapy doses reduced, antiemetic regimen adjusted) Cycle 2 day 15 Gemcitabine/Abraxane 07/18/2020 (continue same dose reduction, reduce post-treatment steroids due to insomnia) Cycle 3 day 1 gem/abraxane 08/02/20 Cycle 3 day 15 gem/Abraxane 08/15/2020 CTs abdomen/pelvis 08/25/2020-pancreas tail mass and portacaval lymphadenopathy decreased.  Liver metastases reported as increased, further review with no significant change in the majority of liver lesions and a decrease in the size of a few lesions, no new margin lesions Cycle 4-day 1 gemcitabine/Abraxane 09/05/2020 Paracentesis 09/26/2020-reactive mesothelial cells Paracentesis 10/09/2020-reactive mesothelial cells Cycle 5 gemcitabine/Abraxane 10/12/2020 Cycle 6 gemcitabine/Abraxane 11/01/2020 Cycle 7 gemcitabine/Abraxane 11/22/2020 Cycle 8 gemcitabine/Abraxane 12/13/2020 Cycle 9 gemcitabine/Abraxane 01/03/2021 CT abdomen/pelvis 01/29/2021-decrease in pancreas tail mass, decrease in size of multiple hepatic metastases, decreased size of hepatoduodenal ligament lymph nodes, progressive ascites Cycle 10 gemcitabine/Abraxane 01/31/2021 CT abdomen/pelvis 04/13/2021- generally stable liver lesions, some have slightly decreased and others slightly increased compared to the CT 01/29/2021, pancreas mass not appreciated, large volume ascites CT abdomen/pelvis 08/02/2021-multiple liver lesions-some are unchanged, fluid density lesions are present-some larger-treated metastases?,   Increased small right pleural effusion, ascites, tail of pancreas lesion Not  measurable CT abdomen/pelvis 08/31/2021-increased size of right inguinal hernia containing ascites, unchanged ascites and small right pleural effusion, stable liver lesions, no pancreas mass   Thoracic aortic aneurysm Hypertension Hyperlipidemia History of bradycardia status post permanent pacemaker Mother had cervical cancer Pitting lower leg edema GERD, on omeprazole  Right upper back inflamed sebaceous cyst-culture from cyst drainage 10/23/2020-2 staph species, antibiotic changed to ciprofloxacin 10/27/2020, status post I&D procedure 11/17/2020 Refractory ascites-trial of spironolactone 03/23/2021; spironolactone and Lasix 04/04/2021--improved 06/21/2021      Disposition: Gary [name] appears stable.  There is no clinical evidence for progression of the pancreas cancer.  The CA 19-9 was slightly higher last month.  We will follow-up on the CA 19-9 from today.  He will return for an office and lab visit in 6 weeks. He will continue home physical therapy. Betsy Coder, MD  12/11/2021  3:08 PM

## 2021-12-11 NOTE — Progress Notes (Deleted)
[place] OFFICE PROGRESS NOTE   Diagnosis:   INTERVAL HISTORY:   ***  Objective:  Vital signs in last 24 hours:  Blood pressure (!) 144/78, pulse 68, temperature 98.1 F (36.7 C), temperature source Oral, resp. rate 18, height 5\' 5"  (1.651 m), weight 149 lb 12.8 oz (67.9 kg), SpO2 100 %.    HEENT: *** Lymphatics: *** Resp: *** Cardio: *** GI: *** Vascular: *** Neuro:***  Skin:***   Portacath/PICC-without erythema  Lab Results:  Lab Results  Component Value Date   WBC 9.0 10/19/2021   HGB 12.0 (L) 10/19/2021   HCT 38.0 (L) 10/19/2021   MCV 84.1 10/19/2021   PLT 177 10/19/2021   NEUTROABS 6.2 08/02/2021    CMP  Lab Results  Component Value Date   NA 137 10/30/2021   K 4.0 10/30/2021   CL 107 10/30/2021   CO2 22 10/30/2021   GLUCOSE 93 10/30/2021   BUN 42 (H) 10/30/2021   CREATININE 1.33 (H) 10/30/2021   CALCIUM 8.9 10/30/2021   PROT 7.4 10/30/2021   ALBUMIN 3.5 10/30/2021   AST 17 10/30/2021   ALT 14 10/30/2021   ALKPHOS 98 10/30/2021   BILITOT 0.6 10/30/2021   GFRNONAA 52 (L) 10/30/2021   GFRAA >60 09/05/2020    Lab Results  Component Value Date   [id number] 744 (H) 10/30/2021     Medications: I have reviewed the patient's current medications.   Assessment/Plan: Pancreas tail lesion, multiple liver lesions Chest CT 04/12/2020-ascending thoracic aortic aneurysm; hypovascular lesion tail of the pancreas, new compared to the prior examination.  Multiple poorly defined hypovascular lesions scattered throughout the liver CT abdomen/pelvis 05/02/2020-hypoenhancing lesion of the pancreatic tail measuring approximately 2.4 x 1.9 cm; enlarged portacaval lymph nodes measuring up to 1.9 x 1.4 cm; numerous hypodense lesions throughout the liver, index lesion of the liver dome measuring 1.7 x 1.2 cm. Ultrasound-guided biopsy of a left liver lesion 05/10/2020-poorly differentiated carcinoma consistent with a pancreatobiliary primary Markedly  elevated CA 19-9  Cycle 1 day 1 gemcitabine/Abraxane 05/26/2020 Cycle 1 day 15 gemcitabine/Abraxane 06/12/2020 Cycle 2 day 1 gemcitabine/Abraxane 07/04/2020 (chemotherapy doses reduced, antiemetic regimen adjusted) Cycle 2 day 15 Gemcitabine/Abraxane 07/18/2020 (continue same dose reduction, reduce post-treatment steroids due to insomnia) Cycle 3 day 1 gem/abraxane 08/02/20 Cycle 3 day 15 gem/Abraxane 08/15/2020 CTs abdomen/pelvis 08/25/2020-pancreas tail mass and portacaval lymphadenopathy decreased.  Liver metastases reported as increased, further review with no significant change in the majority of liver lesions and a decrease in the size of a few lesions, no new margin lesions Cycle 4-day 1 gemcitabine/Abraxane 09/05/2020 Paracentesis 09/26/2020-reactive mesothelial cells Paracentesis 10/09/2020-reactive mesothelial cells Cycle 5 gemcitabine/Abraxane 10/12/2020 Cycle 6 gemcitabine/Abraxane 11/01/2020 Cycle 7 gemcitabine/Abraxane 11/22/2020 Cycle 8 gemcitabine/Abraxane 12/13/2020 Cycle 9 gemcitabine/Abraxane 01/03/2021 CT abdomen/pelvis 01/29/2021-decrease in pancreas tail mass, decrease in size of multiple hepatic metastases, decreased size of hepatoduodenal ligament lymph nodes, progressive ascites Cycle 10 gemcitabine/Abraxane 01/31/2021 CT abdomen/pelvis 04/13/2021- generally stable liver lesions, some have slightly decreased and others slightly increased compared to the CT 01/29/2021, pancreas mass not appreciated, large volume ascites CT abdomen/pelvis 08/02/2021-multiple liver lesions-some are unchanged, fluid density lesions are present-some larger-treated metastases?,  Increased small right pleural effusion, ascites, tail of pancreas lesion Not measurable CT abdomen/pelvis 08/31/2021-increased size of right inguinal hernia containing ascites, unchanged ascites and small right pleural effusion, stable liver lesions, no pancreas mass   Thoracic aortic aneurysm Hypertension Hyperlipidemia History  of bradycardia status post permanent pacemaker Mother had cervical cancer Pitting lower leg edema GERD, on omeprazole  Right upper back inflamed sebaceous cyst-culture from cyst drainage 10/23/2020-2 staph species, antibiotic changed to ciprofloxacin 10/27/2020, status post I&D procedure 11/17/2020 Refractory ascites-trial of spironolactone 03/23/2021; spironolactone and Lasix 04/04/2021--improved 06/21/2021      Disposition: ***  Betsy Coder, MD  12/11/2021  2:55 PM

## 2021-12-12 LAB — CANCER ANTIGEN 19-9: CA 19-9: 676 U/mL — ABNORMAL HIGH (ref 0–35)

## 2021-12-25 DIAGNOSIS — C787 Secondary malignant neoplasm of liver and intrahepatic bile duct: Secondary | ICD-10-CM | POA: Diagnosis not present

## 2021-12-25 DIAGNOSIS — K4090 Unilateral inguinal hernia, without obstruction or gangrene, not specified as recurrent: Secondary | ICD-10-CM | POA: Diagnosis not present

## 2021-12-25 DIAGNOSIS — E7800 Pure hypercholesterolemia, unspecified: Secondary | ICD-10-CM | POA: Diagnosis not present

## 2021-12-25 DIAGNOSIS — R188 Other ascites: Secondary | ICD-10-CM | POA: Diagnosis not present

## 2021-12-25 DIAGNOSIS — C259 Malignant neoplasm of pancreas, unspecified: Secondary | ICD-10-CM | POA: Diagnosis not present

## 2021-12-28 ENCOUNTER — Telehealth: Payer: Self-pay | Admitting: Internal Medicine

## 2021-12-28 NOTE — Telephone Encounter (Signed)
Inbound call from patient daughter states patient is in extreme abd pain, changes in bowel and vomiting. Have an appt 1/20 with Dr. Carlean Purl. Would like to speak to a nurse about what they should do. States can call patient wife back

## 2021-12-28 NOTE — Telephone Encounter (Signed)
Message from daughter that dad has abdominal pain and constipation. I spoke to wife - having little defecation over past week and some abd pain last 1-2 days. Not severe and hernia ok. A little stool w/ prune juice.  I have advised 2 dulcolax po followed by 4 doses MiraLax and then regular prune juice or MiraLax  He has f/u me next week

## 2021-12-28 NOTE — Telephone Encounter (Signed)
Pt states that he has been having abdominal pain along with Constipation for 10 days. Pt states that he feels that the constipation  is causing the abdominal pain.  Pt states that he took a laxative from [facility] once 4 day ago and had some relief.  Pt states he does not know the name of it. Pt  states that he had a very small BM yesterday and today. Pt encouraged to try Miralax once or twice a day until a good BM and he feels relieved.   Pt has an appointment that was  previous Scheduled for 01/04/2022 at 1:30 with with [facility] Pt reminded. Pt notified to call the office Monday if he dose not feel Better Pt verbalized understanding with all questions answered.

## 2021-12-31 ENCOUNTER — Telehealth: Payer: Self-pay | Admitting: Internal Medicine

## 2021-12-31 IMAGING — US IR PARACENTESIS
1 series · 4 of 4 positions shown · non-contrast
Comparison: none

INDICATION: History of metastatic pancreatic cancer, recurrence ascites. Request
for therapeutic paracentesis.

[Series 1: ir (id) (id)/(id)/(id) ir · 4 of 4 slices shown]
[im 1/4]
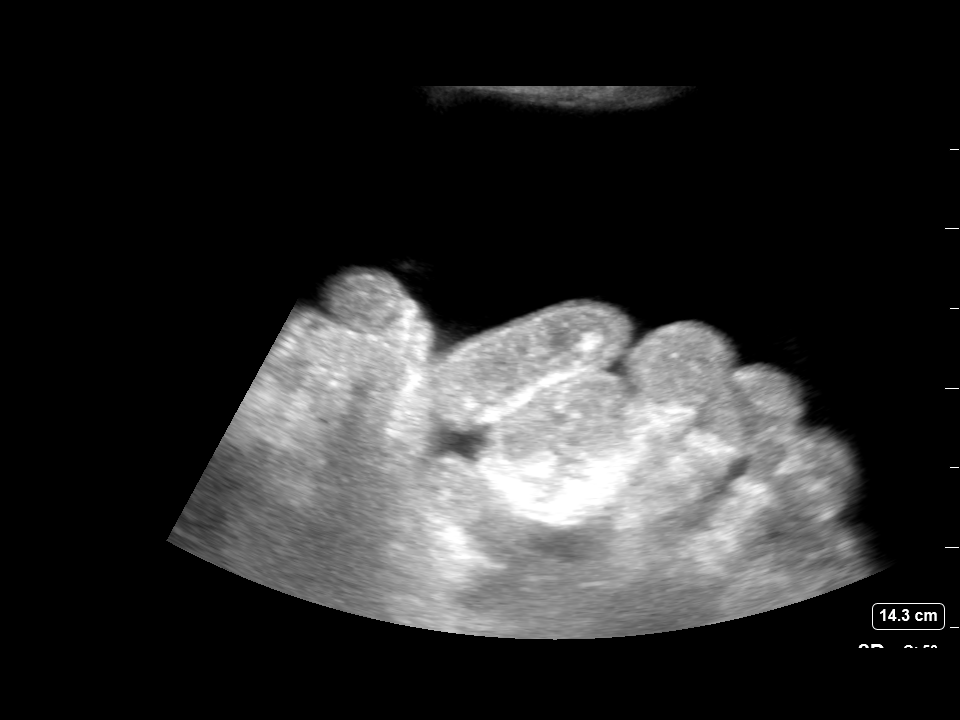
[im 2/4]
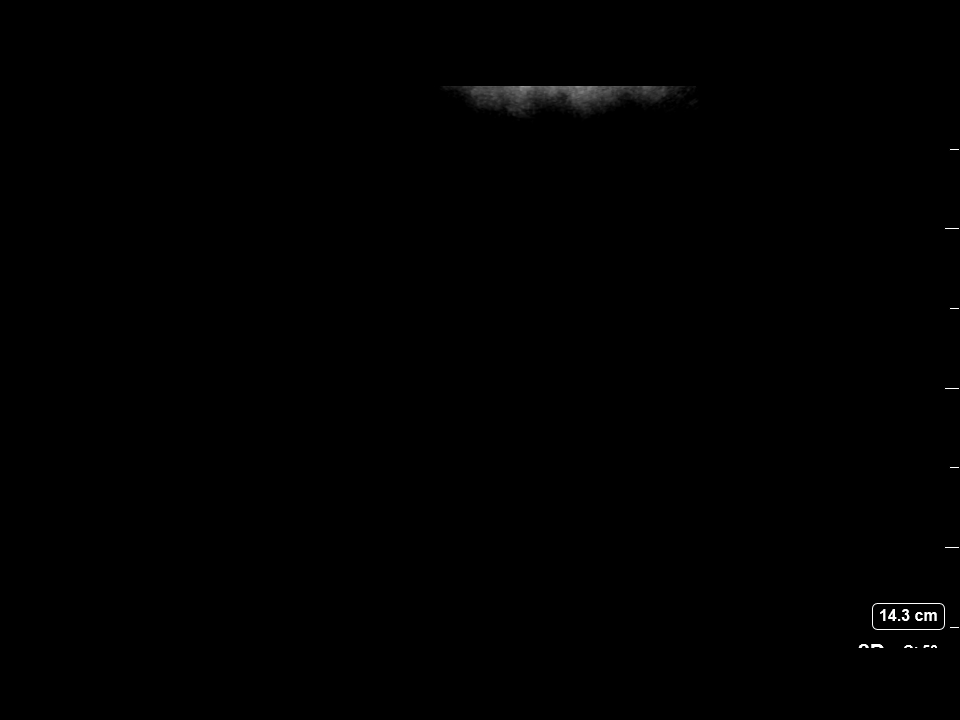
[im 3/4]
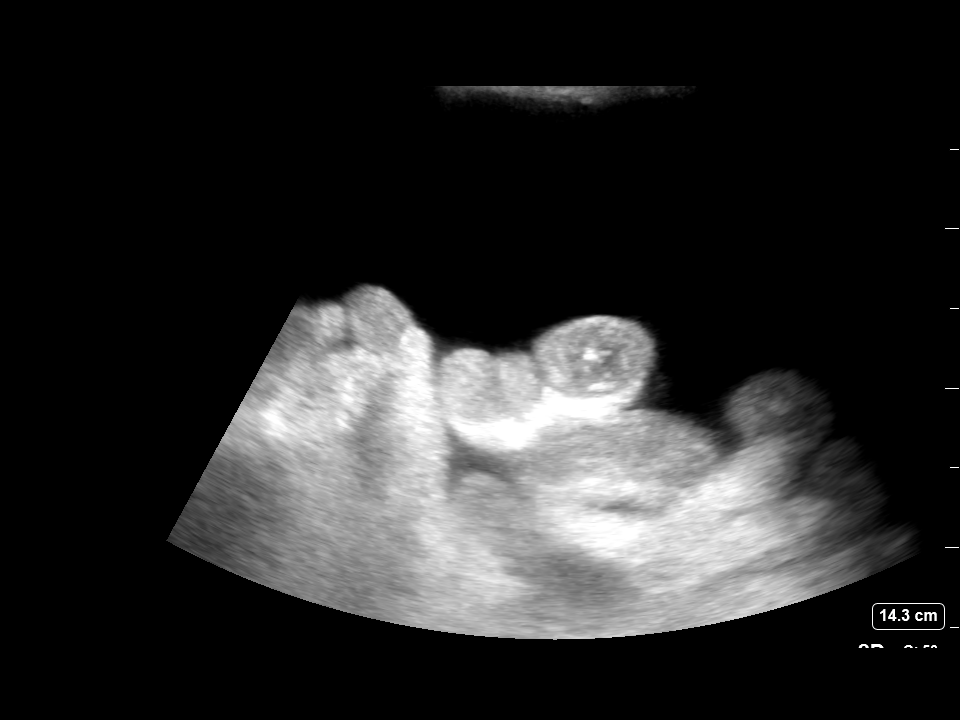
[im 4/4]
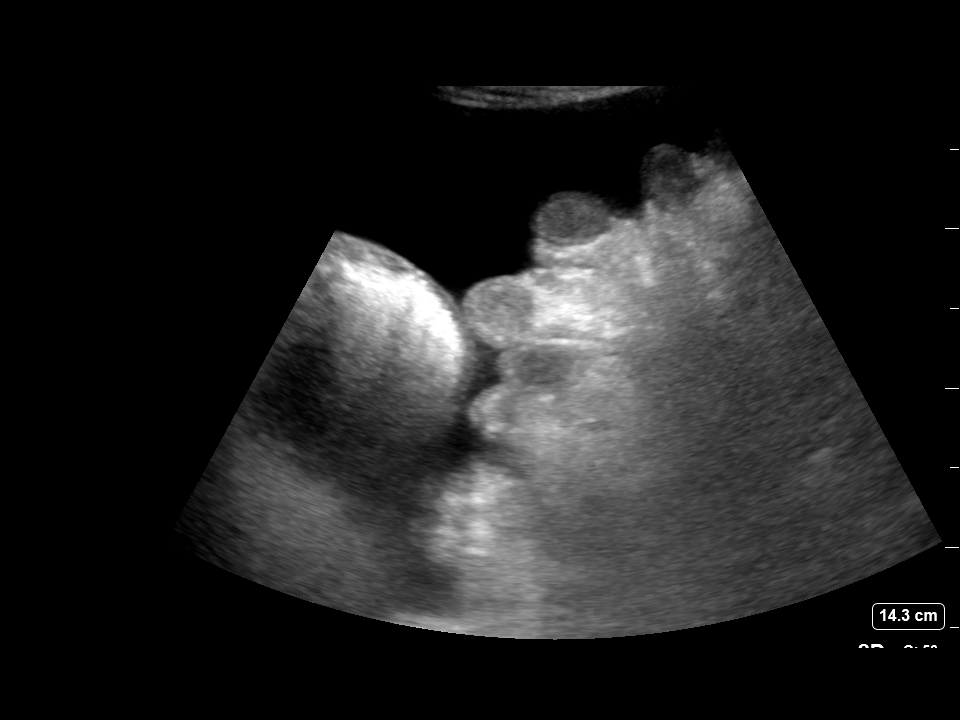

[4 of 4 positions shown; findings below may reference images not displayed]

EXAM:
ULTRASOUND GUIDED  PARACENTESIS

MEDICATIONS:
10 mL 1% lidocaine

COMPLICATIONS:
None immediate.

PROCEDURE:
Informed written consent was obtained from the patient after a
discussion of the risks, benefits and alternatives to treatment. A
timeout was performed prior to the initiation of the procedure.

Initial ultrasound scanning demonstrates a large amount of ascites
within the right lower abdominal quadrant. The right lower abdomen
was prepped and draped in the usual sterile fashion. 1% lidocaine
was used for local anesthesia.

Following this, a 19 gauge, 7-cm, Yueh catheter was introduced. An
ultrasound image was saved for documentation purposes. The
paracentesis was performed. The catheter was removed and a dressing
was applied. The patient tolerated the procedure well without
immediate post procedural complication.
FINDINGS: A total of approximately 3 L of clear yellow fluid was removed.
IMPRESSION: Successful ultrasound-guided paracentesis yielding 3 liters of
peritoneal fluid.

## 2021-12-31 NOTE — Telephone Encounter (Signed)
Patients wife called requesting that patient be seen today or tomorrow. I advised wife that there were no availably. Wife stated that she wanted to speak with PJ. Please advise.

## 2021-12-31 NOTE — Telephone Encounter (Signed)
I spoke with Mrs Pham and she said her husband had a small bowel movement yesterday and today. He is complaining of abdominal pain on and off, no fever, slight nausea. She just gave him 2 XS Tylenol. They have an appointment  01/04/2022. I see in the phone note from 12/28/21 Dr Carlean Purl had given them constipation advise. They did that. I told her to give him miralax 1-2 doses tonight and see if that relieves his pain. I will forward this note to Dr Carlean Purl as they want to be seen sooner than 01/04/2022.

## 2022-01-01 NOTE — Telephone Encounter (Signed)
I can see him tomorrow 1130 if they can make that

## 2022-01-01 NOTE — Telephone Encounter (Signed)
I spoke with Mrs Gary [name] and they will come tomorrow. He had a couple of small bowel movements last night after the miralax. And no more pain after he took the Tylenol.

## 2022-01-02 ENCOUNTER — Ambulatory Visit (INDEPENDENT_AMBULATORY_CARE_PROVIDER_SITE_OTHER): Payer: Medicare Other | Admitting: Internal Medicine

## 2022-01-02 ENCOUNTER — Encounter: Payer: Self-pay | Admitting: Internal Medicine

## 2022-01-02 ENCOUNTER — Other Ambulatory Visit (INDEPENDENT_AMBULATORY_CARE_PROVIDER_SITE_OTHER): Payer: Medicare Other

## 2022-01-02 VITALS — BP 130/64 | HR 64 | Ht 65.0 in | Wt 147.0 lb

## 2022-01-02 DIAGNOSIS — R10811 Right upper quadrant abdominal tenderness: Secondary | ICD-10-CM

## 2022-01-02 DIAGNOSIS — R1084 Generalized abdominal pain: Secondary | ICD-10-CM

## 2022-01-02 DIAGNOSIS — C259 Malignant neoplasm of pancreas, unspecified: Secondary | ICD-10-CM

## 2022-01-02 DIAGNOSIS — C787 Secondary malignant neoplasm of liver and intrahepatic bile duct: Secondary | ICD-10-CM

## 2022-01-02 DIAGNOSIS — R188 Other ascites: Secondary | ICD-10-CM

## 2022-01-02 LAB — COMPREHENSIVE METABOLIC PANEL
ALT: 9 U/L (ref 0–53)
AST: 15 U/L (ref 0–37)
Albumin: 3.6 g/dL (ref 3.5–5.2)
Alkaline Phosphatase: 93 U/L (ref 39–117)
BUN: 31 mg/dL — ABNORMAL HIGH (ref 6–23)
CO2: 26 mEq/L (ref 19–32)
Calcium: 8.9 mg/dL (ref 8.4–10.5)
Chloride: 102 mEq/L (ref 96–112)
Creatinine, Ser: 1.42 mg/dL (ref 0.40–1.50)
GFR: 44.48 mL/min — ABNORMAL LOW (ref >60.00)
Glucose, Bld: 89 mg/dL (ref 70–99)
Potassium: 3.8 mEq/L (ref 3.5–5.1)
Sodium: 135 mEq/L (ref 135–145)
Total Bilirubin: 0.6 mg/dL (ref 0.2–1.2)
Total Protein: 7.4 g/dL (ref 6.0–8.3)

## 2022-01-02 NOTE — Progress Notes (Signed)
Gary [name] 88 y.o. September 26, 1932 [id number]  Assessment & Plan:   Encounter Diagnoses  Name Primary?   Generalized abdominal pain Yes   Right upper quadrant abdominal tenderness without rebound tenderness    Pancreatic cancer metastasized to liver [facility])    Other ascites     Cause of these problems is not clear.  Obviously in the setting of known metastatic pancreatic cancer we have to think of progression of disease.  His CA 19-9 was actually stable to a little decreased recently.  Last CT scan was in September so I am going to repeat a CT abdomen and pelvis hopefully labs will permit IV contrast.  CBC c-Met CA 19-9 and a lipase will be checked. Also TSH per request  Other possibilities could be spontaneous bacterial peritonitis, abdominal wall pain does not seem likely though he does seem to have pain with movement here I did not get a sense that there was abdominal wall tenderness on exam.  The inguinal hernia is not active and that would be localized to the right lower quadrant.  Will continue acetaminophen as needed.  I appreciate the opportunity to care for this patient. CC: Gary Neer, MD    Subjective:   Chief Complaint:  HPI The patient is an 88 year old man here with his wife and son with complaints of abdominal pain.  We have had some communication back-and-forth, recall that he has metastatic pancreatic cancer currently in observation mode, and a history of a right inguinal hernia as well as ascites related to cirrhotic physiology.  This has been coming on over the last week or so there was associated constipation which we treated but that has not resolved the pain.  The stools are smaller and they break up.  He is lost a few pounds at home he was 148 pounds on January 12 he is 146 today.  Overall weight relatively stable however and ascites has been controlled though still present.  It has been months since he has needed a paracentesis.  He is eating less right  now fearful that eating will cause problems though it has not triggered pain.  The pains are random and intermittent and might last up to hours but generally last for minutes.  There is mild back pain at best but no associated referral of the pain.  The pains are in the lower quadrants more so than the upper quadrants it sounds like from listening to him but it can occur in multiple sites of the abdomen.  They are more of a crampy pain but not as sharp or burning pain.  Acetaminophen does provide some relief.  No urinary symptoms.  Has only been eating about once a day now.  He did stop doing the physical therapy he was doing because of the pains coming and going.  He is on a low-dose every other day diuretic regimen with 25 mg spironolactone and 10 mg furosemide every other day.  He saw oncology at the end of December where BUN was 35 creatinine 1.48 liver tests were normal.  His albumin was up to 3.7 as he has been increasing protein intake.  CA 19-9 8 months ago 366, 568 6 months ago 656 5 months ago 744 2 months ago and 676 3 weeks ago.  Wt Readings from Last 3 Encounters:  01/02/22 147 lb (66.7 kg)  12/11/21 149 lb 12.8 oz (67.9 kg)  11/13/21 149 lb 9.6 oz (67.9 kg)      Allergies  Allergen Reactions  Amlodipine Other (See Comments)   Gentamycin [Gentamicin Sulfate]     "Inner ears problems after Gent infusion" , also "unsteady mobility"   Hydrocodone    Aspirin Other (See Comments)    High doses cause stomach upset   Current Meds  Medication Sig   DUPIXENT 300 MG/2ML SOPN Inject 300 mg into the skin every 21 ( twenty-one) days.   furosemide (LASIX) 20 MG tablet Take 10 mg by mouth every other day. 10 mg daily   omeprazole (PRILOSEC) 20 MG capsule Take 1-2 capsules by mouth daily.   spironolactone (ALDACTONE) 25 MG tablet Take 1 tablet (25 mg total) by mouth every other day.   [DISCONTINUED] COVID-19 mRNA bivalent vaccine, Pfizer, (PFIZER COVID-19 VAC BIVALENT) injection Inject  into the muscle.   Past Medical History:  Diagnosis Date   Ascending aortic aneurysm 01/13/2014   stable at 4.7 cm diameter since 2009   Asystole x2, s/p pacemaker 01/13/2014   Bradycardia    permanent pacemaker 11/22/08 [employer].Jude   Family history of adverse reaction to anesthesia    brother died after anesthesia / nose polyp removal at [facility] (40 years ago)   HTN (hypertension) 01/13/2014   Hyperlipidemia    Hypertension    Malignant ascites    Pacemaker - leads 1987, last generator 2009 [employer] Jude dual-chamber 01/13/2014   Atrial lead model 433-01, ventricular lead model 431-01 implanted November 1987 Right ventricular lead dysfunction with low impedance and mediocre sensing but satisfactory pacing threshold   Pacemaker lead malfunction 01/13/2014   Chronic low impedance and poor sensing of the ventricular lead   Pancreatic cancer ([place])    Metastatic to liver   Past Surgical History:  Procedure Laterality Date   IR IMAGING GUIDED PORT INSERTION  05/24/2020   IR PARACENTESIS  10/26/2020   IR PARACENTESIS  12/12/2020   IR PARACENTESIS  01/22/2021   IR PARACENTESIS  02/02/2021   IR PARACENTESIS  02/14/2021   IR PARACENTESIS  02/23/2021   IR PARACENTESIS  03/06/2021   IR PARACENTESIS  03/14/2021   IR PARACENTESIS  03/22/2021   IR PARACENTESIS  03/29/2021   IR PARACENTESIS  04/06/2021   IR PARACENTESIS  04/17/2021   IR PARACENTESIS  04/27/2021   IR PARACENTESIS  05/09/2021   IR PARACENTESIS  05/22/2021   IR PARACENTESIS  09/07/2021   PERMANENT PACEMAKER GENERATOR CHANGE  11/22/08   [employer]. Jude   PERMANENT PACEMAKER INSERTION  03/04/97   [employer]. Jude   US ECHOCARDIOGRAPHY  05/16/11   AS mild to mod.,TR mild to mod., EF => 55%.   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   Review of  Systems As per HPI  Objective:   Physical Exam BP 130/64    Pulse 64    Ht _0  (1.651 m)    Wt 147 lb (66.7 kg)    BMI 24.46 kg/m  Elderly [name] man in no acute distress but he is in pain at times when he moves in the room.  Abdominal pain. The eyes appear anicteric. Lungs are clear Heart sounds are distant S1-S2 I do not hear any rubs murmurs or gallops The abdomen is mildly protuberant the inguinal hernia is not protruding with the patient supine.  He is tender in  the upper abdomen right upper quadrant more so than the epigastrium or left.  Minimally so in the lower quadrants.  There are no masses.  Ascites small amount is thought to be present.

## 2022-01-02 NOTE — Patient Instructions (Addendum)
Your provider has requested that you go to the basement level for lab work before leaving today. Press "B" on the elevator. The lab is located at the first door on the left as you exit the elevator.  Due to recent changes in healthcare laws, you may see the results of your imaging and laboratory studies on MyChart before your provider has had a chance to review them.  We understand that in some cases there may be results that are confusing or concerning to you. Not all laboratory results come back in the same time frame and the provider may be waiting for multiple results in order to interpret others.  Please give [place] 48 hours in order for your provider to thoroughly review all the results before contacting the office for clarification of your results.   Ok to use Tylenol.   You have been scheduled for a CT scan of the abdomen and pelvis at [facility], 1st floor Radiology. You are scheduled on 01/04/22  at 9:30am. You should arrive 15 minutes prior to your appointment time for registration.  Please pick up 2 bottles of contrast from [facility] at least 3 days prior to your scan. The solution may taste better if refrigerated, but do NOT add ice or any other liquid to this solution. Shake well before drinking.   Please follow the written instructions below on the day of your exam:   1) Do not eat anything after 5:30am (4 hours prior to your test)   2) Drink 1 bottle of contrast @ 7:30am (2 hours prior to your exam)  Remember to shake well before drinking and do NOT pour over ice.     Drink 1 bottle of contrast @ 8:30am (1 hour prior to your exam)   You may take any medications as prescribed with a small amount of water, if necessary. If you take any of the following medications: METFORMIN, GLUCOPHAGE, GLUCOVANCE, AVANDAMET, RIOMET, FORTAMET, [place] MET, JANUMET, GLUMETZA or METAGLIP, you MAY be asked to HOLD this medication 48 hours AFTER the exam.   The purpose of you drinking the oral  contrast is to aid in the visualization of your intestinal tract. The contrast solution may cause some diarrhea. Depending on your individual set of symptoms, you may also receive an intravenous injection of x-ray contrast/dye. Plan on being at [facility] for 45 minutes or longer, depending on the type of exam you are having performed.   If you have any questions regarding your exam or if you need to reschedule, you may call [facility] at [phone] between the hours of 8:00 am and 5:00 pm, Monday-Friday.   I appreciate the opportunity to care for you. Silvano Rusk, MD, [facility]

## 2022-01-03 DIAGNOSIS — C259 Malignant neoplasm of pancreas, unspecified: Secondary | ICD-10-CM | POA: Diagnosis not present

## 2022-01-03 DIAGNOSIS — R1084 Generalized abdominal pain: Secondary | ICD-10-CM | POA: Diagnosis not present

## 2022-01-03 DIAGNOSIS — C787 Secondary malignant neoplasm of liver and intrahepatic bile duct: Secondary | ICD-10-CM | POA: Diagnosis not present

## 2022-01-03 DIAGNOSIS — R10811 Right upper quadrant abdominal tenderness: Secondary | ICD-10-CM | POA: Diagnosis not present

## 2022-01-03 DIAGNOSIS — R188 Other ascites: Secondary | ICD-10-CM | POA: Diagnosis not present

## 2022-01-03 LAB — LIPASE: Lipase: 21 U/L (ref 11.0–59.0)

## 2022-01-03 LAB — TSH: TSH: 0.81 u[IU]/mL (ref 0.35–5.50)

## 2022-01-04 ENCOUNTER — Ambulatory Visit (HOSPITAL_COMMUNITY)
Admission: RE | Admit: 2022-01-04 | Discharge: 2022-01-04 | Disposition: A | Discharge status: Home / Self Care | Payer: Medicare Other | Source: Ambulatory Visit | Attending: Internal Medicine | Admitting: Internal Medicine

## 2022-01-04 ENCOUNTER — Ambulatory Visit: Payer: Medicare Other | Admitting: Internal Medicine

## 2022-01-04 ENCOUNTER — Other Ambulatory Visit: Payer: Self-pay

## 2022-01-04 DIAGNOSIS — K769 Liver disease, unspecified: Secondary | ICD-10-CM | POA: Diagnosis not present

## 2022-01-04 DIAGNOSIS — R10811 Right upper quadrant abdominal tenderness: Secondary | ICD-10-CM | POA: Insufficient documentation

## 2022-01-04 DIAGNOSIS — K8020 Calculus of gallbladder without cholecystitis without obstruction: Secondary | ICD-10-CM | POA: Diagnosis not present

## 2022-01-04 DIAGNOSIS — C259 Malignant neoplasm of pancreas, unspecified: Secondary | ICD-10-CM | POA: Insufficient documentation

## 2022-01-04 DIAGNOSIS — C787 Secondary malignant neoplasm of liver and intrahepatic bile duct: Secondary | ICD-10-CM

## 2022-01-04 DIAGNOSIS — R1084 Generalized abdominal pain: Secondary | ICD-10-CM | POA: Diagnosis not present

## 2022-01-04 DIAGNOSIS — R188 Other ascites: Secondary | ICD-10-CM | POA: Insufficient documentation

## 2022-01-04 DIAGNOSIS — I700 Atherosclerosis of aorta: Secondary | ICD-10-CM | POA: Diagnosis not present

## 2022-01-04 DIAGNOSIS — R109 Unspecified abdominal pain: Secondary | ICD-10-CM | POA: Diagnosis not present

## 2022-01-04 LAB — CANCER ANTIGEN 19-9: CA 19-9: 326 U/mL — ABNORMAL HIGH (ref <34)

## 2022-01-04 MED ORDER — IOHEXOL 300 MG/ML  SOLN
70.0000 mL | Freq: Once | INTRAMUSCULAR | Status: AC | PRN
  Administered 2022-01-04: 70 mL via INTRAVENOUS

## 2022-01-07 ENCOUNTER — Telehealth: Payer: Self-pay | Admitting: Internal Medicine

## 2022-01-07 ENCOUNTER — Other Ambulatory Visit: Payer: Self-pay

## 2022-01-07 DIAGNOSIS — R188 Other ascites: Secondary | ICD-10-CM

## 2022-01-07 NOTE — Telephone Encounter (Signed)
Orders for  ultrasound-guided paracentesis in interventional radiology.  Maximum removal of 1 L. Orders  fluid for: Cell count plus differential Albumin Cytology  placed in Epic and scheduled for 01/09/2022 at 1:00 at [facility]: Pt to arrive at 12:30  at [facility]: Pt wife Waqas Bruhl made aware:  Najwah verbalized understanding with all questions answered

## 2022-01-07 NOTE — Telephone Encounter (Signed)
I told Gary [name] that we are working on this. She request it be after 1:00pm if possible as he sleeps late.

## 2022-01-07 NOTE — Telephone Encounter (Signed)
Inbound call from patient wife. States when patient was seen 1/18 there was to be an appt made to extract fluid from his stomach. Would like to speak with PJ

## 2022-01-09 ENCOUNTER — Ambulatory Visit (HOSPITAL_COMMUNITY)
Admission: RE | Admit: 2022-01-09 | Discharge: 2022-01-09 | Disposition: A | Discharge status: Home / Self Care | Payer: Medicare Other | Source: Ambulatory Visit | Attending: Internal Medicine | Admitting: Internal Medicine

## 2022-01-09 ENCOUNTER — Other Ambulatory Visit: Payer: Self-pay

## 2022-01-09 ENCOUNTER — Other Ambulatory Visit: Payer: Self-pay | Admitting: Internal Medicine

## 2022-01-09 DIAGNOSIS — R188 Other ascites: Secondary | ICD-10-CM | POA: Diagnosis not present

## 2022-01-09 MED ORDER — LIDOCAINE HCL 1 % IJ SOLN
INTRAMUSCULAR | Status: AC
  Filled 2022-01-09: qty 20

## 2022-01-09 NOTE — Progress Notes (Signed)
° °  Pt was brought to [place] for paracentesis. Upon US examination, there was no fluid on R and a very small fluid pocket on L. Pt son states that he did not want to drain since it was a small amount and no labs were ordered. Exam was ended and explained to patient.  Patient and son were in agreement with findings.      Narda Rutherford, AGNP-BC 01/09/2022, 1:30 PM

## 2022-01-15 DIAGNOSIS — L603 Nail dystrophy: Secondary | ICD-10-CM | POA: Diagnosis not present

## 2022-01-15 DIAGNOSIS — I70203 Unspecified atherosclerosis of native arteries of extremities, bilateral legs: Secondary | ICD-10-CM | POA: Diagnosis not present

## 2022-01-15 DIAGNOSIS — L602 Onychogryphosis: Secondary | ICD-10-CM | POA: Diagnosis not present

## 2022-01-15 DIAGNOSIS — R262 Difficulty in walking, not elsewhere classified: Secondary | ICD-10-CM | POA: Diagnosis not present

## 2022-01-15 DIAGNOSIS — M6281 Muscle weakness (generalized): Secondary | ICD-10-CM | POA: Diagnosis not present

## 2022-01-22 ENCOUNTER — Encounter: Payer: Self-pay | Admitting: Nurse Practitioner

## 2022-01-22 ENCOUNTER — Inpatient Hospital Stay: Payer: Medicare Other

## 2022-01-22 ENCOUNTER — Encounter (HOSPITAL_COMMUNITY): Payer: Self-pay | Admitting: Emergency Medicine

## 2022-01-22 ENCOUNTER — Inpatient Hospital Stay: Payer: Medicare Other | Attending: Oncology | Admitting: Nurse Practitioner

## 2022-01-22 ENCOUNTER — Other Ambulatory Visit: Payer: Self-pay

## 2022-01-22 VITALS — BP 159/74 | HR 67 | Temp 97.8°F | Resp 20 | Ht 65.0 in | Wt 148.2 lb

## 2022-01-22 DIAGNOSIS — I7121 Aneurysm of the ascending aorta, without rupture: Secondary | ICD-10-CM | POA: Diagnosis not present

## 2022-01-22 DIAGNOSIS — Z452 Encounter for adjustment and management of vascular access device: Secondary | ICD-10-CM | POA: Diagnosis not present

## 2022-01-22 DIAGNOSIS — J90 Pleural effusion, not elsewhere classified: Secondary | ICD-10-CM | POA: Diagnosis not present

## 2022-01-22 DIAGNOSIS — I10 Essential (primary) hypertension: Secondary | ICD-10-CM | POA: Insufficient documentation

## 2022-01-22 DIAGNOSIS — C252 Malignant neoplasm of tail of pancreas: Secondary | ICD-10-CM

## 2022-01-22 DIAGNOSIS — C787 Secondary malignant neoplasm of liver and intrahepatic bile duct: Secondary | ICD-10-CM | POA: Insufficient documentation

## 2022-01-22 DIAGNOSIS — E785 Hyperlipidemia, unspecified: Secondary | ICD-10-CM | POA: Insufficient documentation

## 2022-01-22 DIAGNOSIS — Z95828 Presence of other vascular implants and grafts: Secondary | ICD-10-CM

## 2022-01-22 DIAGNOSIS — R188 Other ascites: Secondary | ICD-10-CM | POA: Insufficient documentation

## 2022-01-22 LAB — CMP (CANCER CENTER ONLY)
ALT: 16 U/L (ref 0–44)
AST: 18 U/L (ref 15–41)
Albumin: 3.8 g/dL (ref 3.5–5.0)
Alkaline Phosphatase: 89 U/L (ref 38–126)
Anion gap: 9 (ref 5–15)
BUN: 32 mg/dL — ABNORMAL HIGH (ref 8–23)
CO2: 23 mmol/L (ref 22–32)
Calcium: 9.3 mg/dL (ref 8.9–10.3)
Chloride: 105 mmol/L (ref 98–111)
Creatinine: 1.34 mg/dL — ABNORMAL HIGH (ref 0.61–1.24)
GFR, Estimated: 51 mL/min — ABNORMAL LOW (ref >60)
Glucose, Bld: 86 mg/dL (ref 70–99)
Potassium: 4.1 mmol/L (ref 3.5–5.1)
Sodium: 137 mmol/L (ref 135–145)
Total Bilirubin: 0.7 mg/dL (ref 0.3–1.2)
Total Protein: 7.7 g/dL (ref 6.5–8.1)

## 2022-01-22 IMAGING — US IR PARACENTESIS
1 series · 1 of 1 positions shown · non-contrast
Comparison: none

INDICATION: History of metastatic pancreatic cancer with recurrent ascites.
Request for therapeutic paracentesis.

[Series 1: ir (id) (id)/(id)/(id) ir · 1 of 1 slices shown]
[im 1/1]
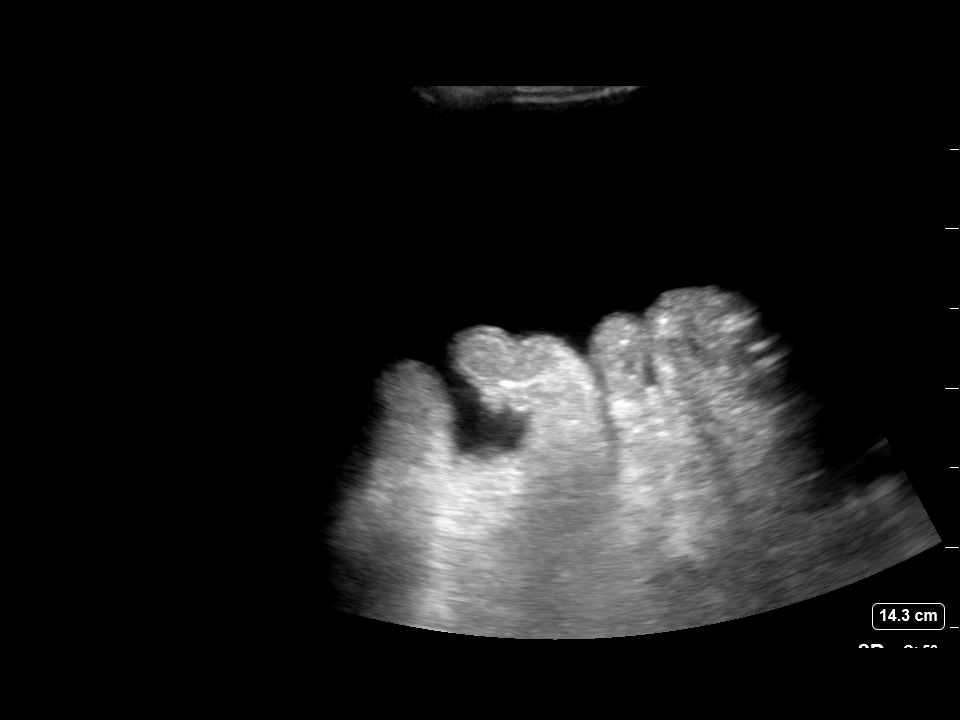

[1 of 1 positions shown; findings below may reference images not displayed]

EXAM:
ULTRASOUND GUIDED  PARACENTESIS

MEDICATIONS:
10 mL 1% lidocaine

COMPLICATIONS:
None immediate.

PROCEDURE:
Informed written consent was obtained from the patient after a
discussion of the risks, benefits and alternatives to treatment. A
timeout was performed prior to the initiation of the procedure.

Initial ultrasound scanning demonstrates a large amount of ascites
within the right lower abdominal quadrant. The right lower abdomen
was prepped and draped in the usual sterile fashion. 1% lidocaine
was used for local anesthesia.

Following this, a 19 gauge, 10-cm, Yueh catheter was introduced. An
ultrasound image was saved for documentation purposes. The
paracentesis was performed. The catheter was removed and a dressing
was applied. The patient tolerated the procedure well without
immediate post procedural complication.
FINDINGS: A total of approximately 3 L of clear yellow fluid was removed.
IMPRESSION: Successful ultrasound-guided paracentesis yielding 3 liters of
peritoneal fluid.

## 2022-01-22 MED ORDER — HEPARIN SOD (PORK) LOCK FLUSH 100 UNIT/ML IV SOLN
500.0000 [IU] | Freq: Once | INTRAVENOUS | Status: AC | PRN
  Administered 2022-01-22: 500 [IU]

## 2022-01-22 MED ORDER — SODIUM CHLORIDE 0.9% FLUSH
10.0000 mL | INTRAVENOUS | Status: DC | PRN
  Administered 2022-01-22: 10 mL

## 2022-01-22 MED ORDER — LIDOCAINE-PRILOCAINE 2.5-2.5 % EX CREA: 1.0000 "application " | TOPICAL_CREAM | CUTANEOUS | 2 refills | Status: DC

## 2022-01-22 NOTE — Addendum Note (Signed)
Addended by: Owens Shark on: 01/22/2022 02:32 PM   Modules accepted: Orders

## 2022-01-22 NOTE — Patient Instructions (Signed)

## 2022-01-22 NOTE — Progress Notes (Signed)
[facility] OFFICE PROGRESS NOTE   Diagnosis:  Pancreas cancer  INTERVAL HISTORY:   Mr. Zietz returns as scheduled.  He has been having intermittent abdominal/back pain for about 3 weeks.  He notes the pain coincides with onset of constipation.  Also about 3 weeks ago he took a dose of MiraLAX and the pain resolved.  He discontinued MiraLAX after the first dose and has been drinking prune juice.  He feels constipated.  He has had 2 episodes of vomiting over the past 2 weeks.  He has a good appetite.  He is eating 2 fairly large meals a day with milk, prune juice and water.  Objective:  Vital signs in last 24 hours:  Blood pressure (!) 159/74, pulse 67, temperature 97.8 F (36.6 C), temperature source Oral, resp. rate 20, height 5\' 5"  (1.651 m), weight 148 lb 3.2 oz (67.2 kg), SpO2 100 %.    Resp: Lungs clear bilaterally. Cardio: Regular rate and rhythm. GI: Abdomen is soft.  Bowel sounds active.  Mild generalized tenderness.  No hepatomegaly.  No mass. Vascular: No leg edema. Port-A-Cath without erythema.  Lab Results:  Lab Results  Component Value Date   WBC 9.0 10/19/2021   HGB 12.0 (L) 10/19/2021   HCT 38.0 (L) 10/19/2021   MCV 84.1 10/19/2021   PLT 177 10/19/2021   NEUTROABS 6.2 08/02/2021    Imaging:  No results found.  Medications: I have reviewed the patient's current medications.  Assessment/Plan:  Pancreas tail lesion, multiple liver lesions Chest CT 04/12/2020-ascending thoracic aortic aneurysm; hypovascular lesion tail of the pancreas, new compared to the prior examination.  Multiple poorly defined hypovascular lesions scattered throughout the liver CT abdomen/pelvis 05/02/2020-hypoenhancing lesion of the pancreatic tail measuring approximately 2.4 x 1.9 cm; enlarged portacaval lymph nodes measuring up to 1.9 x 1.4 cm; numerous hypodense lesions throughout the liver, index lesion of the liver dome measuring 1.7 x 1.2 cm. Ultrasound-guided  biopsy of a left liver lesion 05/10/2020-poorly differentiated carcinoma consistent with a pancreatobiliary primary Markedly elevated CA 19-9  Cycle 1 day 1 gemcitabine/Abraxane 05/26/2020 Cycle 1 day 15 gemcitabine/Abraxane 06/12/2020 Cycle 2 day 1 gemcitabine/Abraxane 07/04/2020 (chemotherapy doses reduced, antiemetic regimen adjusted) Cycle 2 day 15 Gemcitabine/Abraxane 07/18/2020 (continue same dose reduction, reduce post-treatment steroids due to insomnia) Cycle 3 day 1 gem/abraxane 08/02/20 Cycle 3 day 15 gem/Abraxane 08/15/2020 CTs abdomen/pelvis 08/25/2020-pancreas tail mass and portacaval lymphadenopathy decreased.  Liver metastases reported as increased, further review with no significant change in the majority of liver lesions and a decrease in the size of a few lesions, no new margin lesions Cycle 4-day 1 gemcitabine/Abraxane 09/05/2020 Paracentesis 09/26/2020-reactive mesothelial cells Paracentesis 10/09/2020-reactive mesothelial cells Cycle 5 gemcitabine/Abraxane 10/12/2020 Cycle 6 gemcitabine/Abraxane 11/01/2020 Cycle 7 gemcitabine/Abraxane 11/22/2020 Cycle 8 gemcitabine/Abraxane 12/13/2020 Cycle 9 gemcitabine/Abraxane 01/03/2021 CT abdomen/pelvis 01/29/2021-decrease in pancreas tail mass, decrease in size of multiple hepatic metastases, decreased size of hepatoduodenal ligament lymph nodes, progressive ascites Cycle 10 gemcitabine/Abraxane 01/31/2021 CT abdomen/pelvis 04/13/2021- generally stable liver lesions, some have slightly decreased and others slightly increased compared to the CT 01/29/2021, pancreas mass not appreciated, large volume ascites CT abdomen/pelvis 08/02/2021-multiple liver lesions-some are unchanged, fluid density lesions are present-some larger-treated metastases?,  Increased small right pleural effusion, ascites, tail of pancreas lesion Not measurable CT abdomen/pelvis 08/31/2021-increased size of right inguinal hernia containing ascites, unchanged ascites and small right  pleural effusion, stable liver lesions, no pancreas mass CT abdomen/pelvis 01/04/2022-unchanged hypoenhancing liver lesions; moderate volume ascites; primary pancreatic malignancy not clearly visualized; coarse nodular  contour of liver; large hiatal hernia    Thoracic aortic aneurysm Hypertension Hyperlipidemia History of bradycardia status post permanent pacemaker Mother had cervical cancer Pitting lower leg edema GERD, on omeprazole  Right upper back inflamed sebaceous cyst-culture from cyst drainage 10/23/2020-2 staph species, antibiotic changed to ciprofloxacin 10/27/2020, status post I&D procedure 11/17/2020 Refractory ascites-trial of spironolactone 03/23/2021; spironolactone and Lasix 04/04/2021--improved 06/21/2021     Disposition: Mr. Heacock appears unchanged.  He is currently being followed with an observation approach.  Recent CT scans show no evidence of disease progression.  Most recent CA 19-9 tumor marker was improved.  He has been having abdominal/back pain intermittently for the past 3 weeks.  Seems to coincide with constipation.  He felt better after taking a laxative.  He will resume MiraLAX daily, titrate to bowel habits.  We discussed 4 small meals a day versus 2 large meals a day.  We also discussed that the pain could be related to cancer but at present we do not have evidence of this.  He agrees to the laxative and adjusting his intake schedule.  His son and wife are in agreement.  We will see him back in 4 weeks rather than 6 weeks to reevaluate.  They understand to contact the office in the interim if the pain becomes more frequent, increases, or he develops other concerning symptoms.  Plan reviewed with Dr. Benay Spice.      Ned Card ANP/GNP-BC   01/22/2022  2:18 PM

## 2022-01-22 NOTE — Progress Notes (Signed)
PA approved for EMLA cream through [employer] medicare part D, for 01/22/22-01/22/23.

## 2022-01-23 LAB — CANCER ANTIGEN 19-9: CA 19-9: 745 U/mL — ABNORMAL HIGH (ref 0–35)

## 2022-01-30 ENCOUNTER — Ambulatory Visit (INDEPENDENT_AMBULATORY_CARE_PROVIDER_SITE_OTHER): Payer: Medicare Other | Admitting: Internal Medicine

## 2022-01-30 ENCOUNTER — Telehealth: Payer: Self-pay | Admitting: Internal Medicine

## 2022-01-30 ENCOUNTER — Encounter: Payer: Self-pay | Admitting: Internal Medicine

## 2022-01-30 VITALS — BP 144/62 | HR 66 | Ht 65.0 in | Wt 149.0 lb

## 2022-01-30 DIAGNOSIS — R1084 Generalized abdominal pain: Secondary | ICD-10-CM

## 2022-01-30 DIAGNOSIS — R140 Abdominal distension (gaseous): Secondary | ICD-10-CM | POA: Diagnosis not present

## 2022-01-30 DIAGNOSIS — C787 Secondary malignant neoplasm of liver and intrahepatic bile duct: Secondary | ICD-10-CM | POA: Diagnosis not present

## 2022-01-30 DIAGNOSIS — C259 Malignant neoplasm of pancreas, unspecified: Secondary | ICD-10-CM | POA: Diagnosis not present

## 2022-01-30 NOTE — Progress Notes (Signed)
Gary [name] 88 y.o. [birth year] [id number]  Assessment & Plan:   Encounter Diagnoses  Name Primary?   Generalized abdominal pain Yes   Bloating    Pancreatic cancer metastasized to liver ([place])    I cannot find any discrete cause of his problems.  It does not seem to be a cancer pain.  Perhaps there is some neuropathic issues postchemotherapy.  Fortunately it is not long-lasting and seems to respond to Tylenol.  At this point I have asked him to go back to drinking his high-protein milk in part because I think that we will help with nutrition and also appease the family somewhat, and he should take 2 Tylenol at bedtime and try to space out his evening meal and lying down better.  I have suggested a short walk around the house or so after his meal.  I will see him again routinely in 2 months.  We did talk about the possibility of empiric treatment for SBP but I really do not think he has SBP.  I tried to reassure him and his family is much as possible there is an understandable emotional component to this on both sides.  He does have a large hiatal hernia known from previous imaging and I explained that that pain is entirely different than what he is experiencing i.e. would be chest pain.  Subjective:   Chief Complaint: Abdominal pain radiating to the back  HPI 88 year old [name] man here with his son and wife because of abdominal pain.  I had seen him for abdominal pain in January ordered a ultrasound there was limited ascites and we had neglected to order the lab evaluation to evaluate for SBP so no fluid was drained but it sounds like there was minimal fluid from the records review I am doing.  At any rate he got better after that he had also had a CT scan and CA 19-9 levels and labs and everything had been stable and there were no new findings and no signs of cancer progression or changes.  He had some constipation which she has been treating with prune juice a small amount with  every meal.  He decided that stop drinking his high-protein milk because he thought it was causing constipation.  Over the last few weeks he has been again having 3-4 episodes a week of nighttime abdominal distention and pain.  There is some radiation into the back.  He continues to move his bowels without difficulty.  His wife says he will take 2 Tylenol and the pain will dissipate.  He does seem to lie down soon after eating sometimes and that may be associated.  His wife and son are concerned that he is not eating as much as he used to.  His appetite is off.  He is understandably concerned about the cancer and the pain and what this represents.  He feels like he can only eat so much and he says the family is pushing him to eat more.  They dispute how much he says he is eating i.e. 4 small meals a day and they say only 2.  He does continue to walk some and try to do some exercise.  He saw oncology on February 7 and remains in observation mode.  At the oncology visit he reported he had vomited a couple of times but that is not reported today.   Wt Readings from Last 3 Encounters:  01/30/22 149 lb (67.6 kg)  01/22/22 148 lb 3.2 oz (  67.2 kg)  01/02/22 147 lb (66.7 kg)    Allergies  Allergen Reactions   Amlodipine Other (See Comments)   Gentamycin [Gentamicin Sulfate]     "Inner ears problems after Gent infusion" , also "unsteady mobility"   Hydrocodone    Aspirin Other (See Comments)    High doses cause stomach upset   Current Meds  Medication Sig   acetaminophen (TYLENOL) 325 MG tablet 1 tablet as needed   DUPIXENT 300 MG/2ML SOPN Inject 300 mg into the skin every 21 ( twenty-one) days.   furosemide (LASIX) 20 MG tablet Take 10 mg by mouth every other day. 10 mg daily   lidocaine-prilocaine (EMLA) cream Apply 1 application topically as directed. Apply 1 hour prior to IV stick and cover with plastic wrap   omeprazole (PRILOSEC) 20 MG capsule Take 1-2 capsules by mouth daily.   spironolactone  (ALDACTONE) 25 MG tablet Take 1 tablet (25 mg total) by mouth every other day.   Past Medical History:  Diagnosis Date   Ascending aortic aneurysm 01/13/2014   stable at 4.7 cm diameter since 2009   Asystole x2, s/p pacemaker 01/13/2014   Bradycardia    permanent pacemaker 11/22/08 [employer].Jude   Family history of adverse reaction to anesthesia    brother died after anesthesia / nose polyp removal [facility] (40 years ago)   HTN (hypertension) 01/13/2014   Hyperlipidemia    Hypertension    Malignant ascites    Pacemaker - leads 1987, last generator 2009 [employer] Jude dual-chamber 01/13/2014   Atrial lead model 433-01, ventricular lead model 431-01 implanted November 1987 Right ventricular lead dysfunction with low impedance and mediocre sensing but satisfactory pacing threshold   Pacemaker lead malfunction 01/13/2014   Chronic low impedance and poor sensing of the ventricular lead   Pancreatic cancer ([place])    Metastatic to liver   Past Surgical History:  Procedure Laterality Date   IR IMAGING GUIDED PORT INSERTION  05/24/2020   IR PARACENTESIS  10/26/2020   IR PARACENTESIS  12/12/2020   IR PARACENTESIS  01/22/2021   IR PARACENTESIS  02/02/2021   IR PARACENTESIS  02/14/2021   IR PARACENTESIS  02/23/2021   IR PARACENTESIS  03/06/2021   IR PARACENTESIS  03/14/2021   IR PARACENTESIS  03/22/2021   IR PARACENTESIS  03/29/2021   IR PARACENTESIS  04/06/2021   IR PARACENTESIS  04/17/2021   IR PARACENTESIS  04/27/2021   IR PARACENTESIS  05/09/2021   IR PARACENTESIS  05/22/2021   IR PARACENTESIS  09/07/2021   PERMANENT PACEMAKER GENERATOR CHANGE  11/22/08   ST. Jude   PERMANENT PACEMAKER INSERTION  03/04/97   ST. Jude   US ECHOCARDIOGRAPHY  05/16/11   AS mild to mod.,TR mild to mod., EF => 55%.   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   Review of Systems See HPI  Objective:   Physical Exam BP (!) 144/62    Pulse 66    Ht 5\' 5"  (1.651 m)    Wt 149 lb (67.6 kg)    BMI 24.79 kg/m  Elderly chronically ill [place] man in no acute distress though he is somewhat asthenic and moves slowly to the exam table and requires some assistance. Back is nontender Abdomen  soft there is a soft/slightly firm known right inguinal hernia that is nontender.  There is no obvious ascites on exam bowel sounds are present and there are no bruits and there is no tenderness or mass.

## 2022-01-30 NOTE — Telephone Encounter (Signed)
Patient has been having increasing abdominal pain and some back pain over the last few days and is having difficulty sleeping.  He is moving his bowels well.  I called his daughter to review this.  He was seen in oncology on 2 7 and things are stable overall without signs of cancer or disease progression.  CT scanning in late January showed a little bit of ascites but no obvious cause of pain that he was having then.  He was sent for an ultrasound-guided paracentesis there was minimal fluid and we had not ordered testing for SBP so no fluid was withdrawn.  We will have him see me at 350 today for evaluation and management.

## 2022-01-30 NOTE — Patient Instructions (Signed)
Take two tylenol every night and give more time after eating before going to bed. Try to stay up a couple of hours after eating.  Drink your milk.   I appreciate the opportunity to care for you. Silvano Rusk, MD, [facility]

## 2022-02-18 ENCOUNTER — Inpatient Hospital Stay: Payer: Medicare Other | Admitting: Oncology

## 2022-02-20 DIAGNOSIS — R310 Gross hematuria: Secondary | ICD-10-CM | POA: Diagnosis not present

## 2022-02-21 DIAGNOSIS — R310 Gross hematuria: Secondary | ICD-10-CM | POA: Diagnosis not present

## 2022-02-28 ENCOUNTER — Telehealth: Payer: Self-pay | Admitting: Internal Medicine

## 2022-02-28 NOTE — Telephone Encounter (Signed)
Inbound call from patients son stating that patient had blood in his urine a few weeks ago and it only happened one day and 3 times during that day. Patients son told him to stop taking the medication that Dr. Carlean Purl proscribed him to see if that would help. Patients son stated he stopped taking the medication for a week and never had any other issues. Patients son and patient are seeking advice if it is okay for patient to take medications for a few days and then stop for a few days to help his kidneys. Please advise.   ?

## 2022-02-28 NOTE — Telephone Encounter (Signed)
Pt son Gary [name] stating that patient had blood in his urine a few weeks ago and it only happened one day which occurred  3 times during that day. Patients son told him to stop taking the medication that Dr. Carlean Purl proscribed him to see if that would help. Patients son stated he stopped taking the medication for a week and never had any other issues. Patients son and patient are seeking advice if it is okay for patient to take medications for a few days and then stop for a few days to help his kidneys ?Gary [name] Notified that this is not typically a side effect from the medication: Gary [name] stated that the pt has followed up with urologist since then and all workup has been negative: ?Gary [name] notified for pt to not  NOT start and stop these medications and to take as prescribed as these medications were keeping the fluid off of him and to minimize to paracentesis that he has:  ?Gary [name] verbalized understanding with all questions answered.  ? ?

## 2022-03-06 DIAGNOSIS — R350 Frequency of micturition: Secondary | ICD-10-CM | POA: Diagnosis not present

## 2022-03-06 DIAGNOSIS — R310 Gross hematuria: Secondary | ICD-10-CM | POA: Diagnosis not present

## 2022-03-06 DIAGNOSIS — N401 Enlarged prostate with lower urinary tract symptoms: Secondary | ICD-10-CM | POA: Diagnosis not present

## 2022-03-12 ENCOUNTER — Inpatient Hospital Stay: Payer: Medicare Other

## 2022-03-12 ENCOUNTER — Inpatient Hospital Stay: Payer: Medicare Other | Attending: Oncology | Admitting: Oncology

## 2022-03-12 ENCOUNTER — Other Ambulatory Visit: Payer: Self-pay

## 2022-03-12 VITALS — BP 154/75 | HR 70 | Temp 97.8°F | Resp 18 | Ht 65.0 in | Wt 154.8 lb

## 2022-03-12 DIAGNOSIS — R188 Other ascites: Secondary | ICD-10-CM | POA: Insufficient documentation

## 2022-03-12 DIAGNOSIS — Z95828 Presence of other vascular implants and grafts: Secondary | ICD-10-CM

## 2022-03-12 DIAGNOSIS — C252 Malignant neoplasm of tail of pancreas: Secondary | ICD-10-CM

## 2022-03-12 DIAGNOSIS — Z452 Encounter for adjustment and management of vascular access device: Secondary | ICD-10-CM | POA: Diagnosis not present

## 2022-03-12 DIAGNOSIS — C787 Secondary malignant neoplasm of liver and intrahepatic bile duct: Secondary | ICD-10-CM | POA: Diagnosis not present

## 2022-03-12 DIAGNOSIS — E785 Hyperlipidemia, unspecified: Secondary | ICD-10-CM | POA: Diagnosis not present

## 2022-03-12 DIAGNOSIS — I10 Essential (primary) hypertension: Secondary | ICD-10-CM | POA: Diagnosis not present

## 2022-03-12 DIAGNOSIS — I7121 Aneurysm of the ascending aorta, without rupture: Secondary | ICD-10-CM | POA: Insufficient documentation

## 2022-03-12 LAB — CMP (CANCER CENTER ONLY)
ALT: 27 U/L (ref 0–44)
AST: 25 U/L (ref 15–41)
Albumin: 3.8 g/dL (ref 3.5–5.0)
Alkaline Phosphatase: 105 U/L (ref 38–126)
Anion gap: 9 (ref 5–15)
BUN: 33 mg/dL — ABNORMAL HIGH (ref 8–23)
CO2: 22 mmol/L (ref 22–32)
Calcium: 9.2 mg/dL (ref 8.9–10.3)
Chloride: 106 mmol/L (ref 98–111)
Creatinine: 1.32 mg/dL — ABNORMAL HIGH (ref 0.61–1.24)
GFR, Estimated: 52 mL/min — ABNORMAL LOW (ref >60)
Glucose, Bld: 91 mg/dL (ref 70–99)
Potassium: 4.2 mmol/L (ref 3.5–5.1)
Sodium: 137 mmol/L (ref 135–145)
Total Bilirubin: 0.6 mg/dL (ref 0.3–1.2)
Total Protein: 7.6 g/dL (ref 6.5–8.1)

## 2022-03-12 MED ORDER — SODIUM CHLORIDE 0.9% FLUSH
10.0000 mL | INTRAVENOUS | Status: DC | PRN
  Administered 2022-03-12: 10 mL

## 2022-03-12 MED ORDER — HEPARIN SOD (PORK) LOCK FLUSH 100 UNIT/ML IV SOLN
500.0000 [IU] | Freq: Once | INTRAVENOUS | Status: AC | PRN
  Administered 2022-03-12: 500 [IU]

## 2022-03-12 NOTE — Patient Instructions (Signed)
Heparin injection ?O que ? este medicamento? ?A HEPARINA ? um anticoagulante. ? usada para tratar ou prevenir co?gulos nas veias, art?rias, pulm?es ou cora??o. Impede que os co?gulos se formem ou aumentem de tamanho. Este [employer] evita que o sangue coagule durante uma cirurgia card?aca aberta, durante uma di?lise ou em pessoas acamadas. ?Este medicamento pode ser usado para outros prop?sitos; em caso de d?vidas, pergunte ao seu profissional de sa?de ou farmac?utico. ?NOMES DE MARCAS COMUNS: Hep-Lock, Hep-Lock U/P, Hepflush-10, Monoject Prefill Advanced Heparin Lock Flush, SASH Normal Saline and Heparin ?O que devo dizer a meu profissional de sa?de antes de tomar este medicamento? ?Precisam saber se voc? tem algum dos seguintes problemas ou estados de sa?de: ?problemas de sangramento, como hemofilia ou contagem baixa de plaquetas ?doen?a intestinal ou diverticulite ?endocardite ?press?o alta ?doen?as hep?ticas ?cirurgia ou parto recente ??lcera estomacal ?rea??o estranha ou alergia ? heparina, ao ?lcool benz?lico ou aos sulfitos ?rea??o estranha ou alergia a outros medicamentos, alimentos, corantes ou conservantes ?est? gr?vida ou tentando engravidar ?est? amamentando ?Como devo usar este medicamento? ?[employer] deve ser injetado ou administrado por infus?o intravenosa. Tamb?m pode ser administrado pela inje??o subcut?nea de pequenas quantidades. Este medicamento costuma ser administrado por um profissional da sa?de no hospital ou em consult?rio. ?Se este medicamento for [employer], voc? ser? ensinado a preparar e aplicar o medicamento. Use exatamente como indicado. Tome este medicamento em intervalos regulares. N?o tome este medicamento com frequ?ncia maior do que a indicada. N?o pare de [employer] a menos que seu m?dico [place]. Interromper o uso deste medicamento pode aumentar o risco de co?gulos sangu?neos. Certifique-se de renovar a sua receita antes de ficar sem o medicamento. ??  muito importante que as Social worker e seringas usadas sejam descartadas em um coletor especial para materiais perfurocortantes. N?o as coloque na lata de lixo. Se n?o tiver um coletor para materiais perfurocortantes, pe?a um a seu farmac?utico ou profissional de sa?de. ?Fale com seu pediatra a respeito do uso deste medicamento em crian?as. Embora este medicamento j? tenha sido usado em crian?as pequenas para certas condi??es, algumas precau??es s?o necess?rias. ?Superdosagem: Se achar que tomou uma superdosagem deste medicamento, entre em contato imediatamente com o [facility] de Intoxica??es ou v? a um pronto-socorro. ?OBSERVA??O: Este medicamento ? s? para voc?Marland Kitchen N?o compartilhe este medicamento com outras pessoas. ?E se eu deixar de tomar uma dose? ?Se perder uma dose, tome-a assim que poss?vel. Se j? estiver quase na hora da sua pr?xima dose, tome somente [place] dose. N?o tome o rem?dio em dobro, nem tome uma dose adicional. ?O que pode interagir com este medicamento? ?N?o tome este medicamento com nenhum dos seguintes: ?aspirina e medicamentos similares ?mifepristona ?alguns medicamentos que tratam ou previnem a trombose, como varfarina, enoxaparina e dalteparina ?palifermina ?protamina Este medicamento tamb?m pode interagir com os seguintes rem?dios: ?dextrana ?digoxina ?hidroxicloroquina ?medicamentos para resfriado ou alergia ?nicotina ?AINEs, medicamentos analg?sicos e anti-inflamat?rios, como ibuprofeno, naproxeno ?fenilbutazona ?tetraciclinas ?Esta lista pode n?o descrever todas as intera??es poss?veis. D? ao seu profissional de sa?de [employer] de todos os medicamentos, ervas medicinais, rem?dios de venda livre, ou suplementos alimentares que voc? [place]. Diga tamb?m se voc? fuma, bebe, ou [place] drogas il?citas. Alguns destes podem interagir com o seu medicamento. ?Ao que devo ficar atento quando estiver [employer] medicamento? ?Consulte seu m?dico ou profissional de sa?de para realizar um [employer]  regular da sua evolu??o. Voc? precisar? fazer exames de sangue peri?dicos enquanto estiver [employer]. Voc? ser? monitorado(a) atentamente enquanto estiver [employer]  medicamento. ? importante que voc? n?o perca nenhuma consulta. ?Use uma pulseira ou corrente de identifica??o m?dica. Leve sempre consigo uma carteirinha de identifica??o com os detalhes do seu problema de sa?de e [employer] voc? toma e com o nome do seu m?dico. ?Se as suas m?os ou p?s ficarem frios e azulados, avise seu m?dico ou profissional de sa?de imediatamente. ?Se vai precisar passar por uma cirurgia ou outro procedimento, informe a seu m?dico ou profissional de sa?de que est? [employer]. ?Enquanto estiver [employer], evite esportes e atividades nas quais possa se Retail banker. Quedas fortes ou ferimentos podem provocar hemorragias invis?veis. Tome cuidado ao usar ferramentas pontiagudas ou facas. Pense em usar um barbeador el?trico. Tome muito cuidado ao escovar os dentes e usar fio dental. Caso ocorra qualquer ferimento, hematoma ou manchas vermelhas na pele, avise seu m?dico ou profissional de sa?de. ?Usar este medicamento por muito tempo pode causar enfraquecimento ?sseo e aumentar o risco de fraturas. ?Voc? deve consumir c?lcio e vitamina D em quantidade suficiente enquanto estiver [employer]. Converse com seu m?dico ou profissional de sa?de a [employer] que consome e das vitaminas que toma. ?Use uma pulseira ou corrente de identifica??o m?dica. Mantenha consigo um cart?o que descreve a sua doen?a, os detalhes deste medicamento e os hor?rios de dosagem. ?Que efeitos colaterais posso sentir ap?s usar este medicamento? ?Efeitos colaterais que devem ser informados ao seu m?dico ou profissional de sa?de o mais r?pido poss?vel: ?rea??es al?rgicas, como erup??o na pele, coceira, urtic?ria, ou incha?o do rosto, dos l?bios ou da l?ngua ?dor nos ossos ?febre ou  calafrios ?enjoo ou v?mitos ?sinais ou sintomas de hemorragia, como fezes sanguinolentas ou negras com aspecto de piche; urina vermelha ou marrom escuro; tosse ou v?mitos com sangue ou com grumos marrons com aspecto de borra de caf?; manchas vermelhas na pele; e hematomas ou sangramentos fora do comum nos olhos, gengivas ou nariz ?sinais e sintomas de um co?gulo sangu?neo, tais como dor no peito; falta de ar; dor, incha?o ou calor na perna ?sinais e sintomas de derrame, como altera??es na vis?o; confus?o; dificuldade para falar ou entender os outros; cefaleia grave; dorm?ncia s?bita ou fraqueza na face, no bra?o ou na perna; dificuldade para andar; tontura; perda de coordena??o Efeitos colaterais que normalmente n?o precisam de cuidados m?dicos (avise ao seu m?dico ou profissional de sa?de se persistirem ou forem inc?modos): ?queda de cabelo ?dor, vermelhid?o, coceira ou irrita??o no local da inje??o ?Esta lista pode n?o descrever todos os efeitos colaterais poss?veis. Para mais orienta??es sobre efeitos colaterais, consulte o seu m?dico. Voc? pode relatar a ocorr?ncia de efeitos colaterais ? FDA pelo telefone [phone]. ?Onde devo guardar meu medicamento? ?[facility] fora do [employer]. ?Conservar frascos-ampola fechados em [employer], entre 15 e 30?C (59 e 86?F). N?o congelar. N?o use este medicamento se a solu??o Chartered certified accountant ou part?culas estiverem presentes. Descartar qualquer medicamento n?o utilizado ap?s a data de validade impressa no r?tulo ou embalagem. ?OBSERVA??O: Este folheto ? um resumo. Pode n?o cobrir todas as informa??es poss?veis. Se tiver d?vidas a respeito deste medicamento, fale com seu m?dico, farmac?utico ou profissional de sa?de. ?? 2022 Elsevier/Gold Standard (2021-08-08 00:00:00) ? ?

## 2022-03-12 NOTE — Progress Notes (Signed)
?Gary [name] ?OFFICE PROGRESS NOTE ? ? ?Diagnosis: Pancreas cancer ? ?INTERVAL HISTORY:  ? ?Gary [name] returns as scheduled.  Good appetite.  He is exercising.  Athletic tape helps reduce the inguinal hernia.  No new complaint. ? ?Objective: ? ?Vital signs in last 24 hours: ? ?Blood pressure (!) 154/75, pulse 70, temperature 97.8 ?F (36.6 ?C), temperature source Oral, resp. rate 18, height '5\' 5"'$  (1.651 m), weight 154 lb 12.8 oz (70.2 kg), SpO2 100 %. ?  ? ?Resp: Lungs clear bilaterally ?Cardio: Regular rate and rhythm ?GI: No hepatosplenomegaly, soft, no mass, nontender, no apparent ascites ?Vascular: No leg edema ? ? ?Portacath/PICC-without erythema ? ?Lab Results: ? ?Lab Results  ?Component Value Date  ? WBC 9.0 10/19/2021  ? HGB 12.0 (L) 10/19/2021  ? HCT 38.0 (L) 10/19/2021  ? MCV 84.1 10/19/2021  ? PLT 177 10/19/2021  ? NEUTROABS 6.2 08/02/2021  ? ? ?CMP  ?Lab Results  ?Component Value Date  ? NA 137 01/22/2022  ? K 4.1 01/22/2022  ? CL 105 01/22/2022  ? CO2 23 01/22/2022  ? GLUCOSE 86 01/22/2022  ? BUN 32 (H) 01/22/2022  ? CREATININE 1.34 (H) 01/22/2022  ? CALCIUM 9.3 01/22/2022  ? PROT 7.7 01/22/2022  ? ALBUMIN 3.8 01/22/2022  ? AST 18 01/22/2022  ? ALT 16 01/22/2022  ? ALKPHOS 89 01/22/2022  ? BILITOT 0.7 01/22/2022  ? GFRNONAA 51 (L) 01/22/2022  ? GFRAA >60 09/05/2020  ? ? ?Lab Results  ?Component Value Date  ? URK270 745 (H) 01/22/2022  ? ? ?Lab Results  ?Component Value Date  ? INR 1.2 (H) 05/01/2021  ? LABPROT 12.9 05/01/2021  ? ? ?Imaging: ? ?No results found. ? ?Medications: I have reviewed the patient's current medications. ? ? ?Assessment/Plan: ? ?Pancreas tail lesion, multiple liver lesions ?Chest CT 04/12/2020-ascending thoracic aortic aneurysm; hypovascular lesion tail of the pancreas, new compared to the prior examination.  Multiple poorly defined hypovascular lesions scattered throughout the liver ?CT abdomen/pelvis 05/02/2020-hypoenhancing lesion of the pancreatic tail measuring  approximately 2.4 x 1.9 cm; enlarged portacaval lymph nodes measuring up to 1.9 x 1.4 cm; numerous hypodense lesions throughout the liver, index lesion of the liver dome measuring 1.7 x 1.2 cm. ?Ultrasound-guided biopsy of a left liver lesion 05/10/2020-poorly differentiated carcinoma consistent with a pancreatobiliary primary ?Markedly elevated CA 19-9  ?Cycle 1 day 1 gemcitabine/Abraxane 05/26/2020 ?Cycle 1 day 15 gemcitabine/Abraxane 06/12/2020 ?Cycle 2 day 1 gemcitabine/Abraxane 07/04/2020 (chemotherapy doses reduced, antiemetic regimen adjusted) ?Cycle 2 day 15 Gemcitabine/Abraxane 07/18/2020 (continue same dose reduction, reduce post-treatment steroids due to insomnia) ?Cycle 3 day 1 gem/abraxane 08/02/20 ?Cycle 3 day 15 gem/Abraxane 08/15/2020 ?CTs abdomen/pelvis 08/25/2020-pancreas tail mass and portacaval lymphadenopathy decreased.  Liver metastases reported as increased, further review with no significant change in the majority of liver lesions and a decrease in the size of a few lesions, no new margin lesions ?Cycle 4-day 1 gemcitabine/Abraxane 09/05/2020 ?Paracentesis 09/26/2020-reactive mesothelial cells ?Paracentesis 10/09/2020-reactive mesothelial cells ?Cycle 5 gemcitabine/Abraxane 10/12/2020 ?Cycle 6 gemcitabine/Abraxane 11/01/2020 ?Cycle 7 gemcitabine/Abraxane 11/22/2020 ?Cycle 8 gemcitabine/Abraxane 12/13/2020 ?Cycle 9 gemcitabine/Abraxane 01/03/2021 ?CT abdomen/pelvis 01/29/2021-decrease in pancreas tail mass, decrease in size of multiple hepatic metastases, decreased size of hepatoduodenal ligament lymph nodes, progressive ascites ?Cycle 10 gemcitabine/Abraxane 01/31/2021 ?CT abdomen/pelvis 04/13/2021- generally stable liver lesions, some have slightly decreased and others slightly increased compared to the CT 01/29/2021, pancreas mass not appreciated, large volume ascites ?CT abdomen/pelvis 08/02/2021-multiple liver lesions-some are unchanged, fluid density lesions are present-some larger-treated metastases?,   Increased small  right pleural effusion, ascites, tail of pancreas lesion ?Not measurable ?CT abdomen/pelvis 08/31/2021-increased size of right inguinal hernia containing ascites, unchanged ascites and small right pleural effusion, stable liver lesions, no pancreas mass ?CT abdomen/pelvis 01/04/2022-unchanged hypoenhancing liver lesions; moderate volume ascites; primary pancreatic malignancy not clearly visualized; coarse nodular contour of liver; large hiatal hernia ?  ? ?Thoracic aortic aneurysm ?Hypertension ?Hyperlipidemia ?History of bradycardia status post permanent pacemaker ?Mother had cervical cancer ?Pitting lower leg edema ?GERD, on omeprazole  ?Right upper back inflamed sebaceous cyst-culture from cyst drainage 10/23/2020-2 staph species, antibiotic changed to ciprofloxacin 10/27/2020, status post I&D procedure 11/17/2020 ?Refractory ascites-trial of spironolactone 03/23/2021; spironolactone and Lasix 04/04/2021--improved 06/21/2021 ?  ?  ? ?Disposition: ? Gary [name] appears stable.  There is no clinical evidence for progression of the metastatic pancreas cancer.  The CA 19-9 remains elevated.  We will check a chemistry panel and CA 19-9 today.  He will return for an office visit in 6-8 weeks. ?The ascites appears to be well controlled with current diuretic regimen.  He is scheduled to see Dr. Carlean Purl next month. ? ?Betsy Coder, MD ? ?03/12/2022  ?12:55 PM ? ? ?

## 2022-03-13 LAB — CANCER ANTIGEN 19-9: CA 19-9: 743 U/mL — ABNORMAL HIGH (ref 0–35)

## 2022-03-29 DIAGNOSIS — R262 Difficulty in walking, not elsewhere classified: Secondary | ICD-10-CM | POA: Diagnosis not present

## 2022-03-29 DIAGNOSIS — M6281 Muscle weakness (generalized): Secondary | ICD-10-CM | POA: Diagnosis not present

## 2022-03-29 DIAGNOSIS — I70203 Unspecified atherosclerosis of native arteries of extremities, bilateral legs: Secondary | ICD-10-CM | POA: Diagnosis not present

## 2022-03-29 DIAGNOSIS — L602 Onychogryphosis: Secondary | ICD-10-CM | POA: Diagnosis not present

## 2022-04-11 ENCOUNTER — Encounter: Payer: Self-pay | Admitting: Internal Medicine

## 2022-04-11 ENCOUNTER — Ambulatory Visit (INDEPENDENT_AMBULATORY_CARE_PROVIDER_SITE_OTHER): Payer: Medicare Other | Admitting: Internal Medicine

## 2022-04-11 VITALS — BP 138/70 | HR 72 | Ht 65.0 in | Wt 155.0 lb

## 2022-04-11 DIAGNOSIS — K4090 Unilateral inguinal hernia, without obstruction or gangrene, not specified as recurrent: Secondary | ICD-10-CM | POA: Diagnosis not present

## 2022-04-11 DIAGNOSIS — R188 Other ascites: Secondary | ICD-10-CM

## 2022-04-11 DIAGNOSIS — C259 Malignant neoplasm of pancreas, unspecified: Secondary | ICD-10-CM | POA: Diagnosis not present

## 2022-04-11 DIAGNOSIS — C787 Secondary malignant neoplasm of liver and intrahepatic bile duct: Secondary | ICD-10-CM | POA: Diagnosis not present

## 2022-04-11 NOTE — Patient Instructions (Signed)
Great to see you today.    I appreciate the opportunity to care for you. Carl Gessner, MD, FACG 

## 2022-04-11 NOTE — Progress Notes (Signed)
? ?Gary [name] y.o. Aug 14, 1932 [id number] ? ?Assessment & Plan:  ? ?Encounter Diagnoses  ?Name Primary?  ? Pancreatic cancer metastasized to liver [name]) Yes  ? Other ascites   ? Right inguinal hernia   ? ? ?The patient is doing well.  We will continue current diuretic regimen and he will see me at the end of June sooner if needed. ? ?CC: Gary Neer, MD ? ? ?Subjective:  ? ?Chief Complaint: Follow-up of ascites and abdominal pain ? ?HPI ?88 year old white man here with his wife and daughter, for follow-up of ascites in the setting of pancreatic cancer metastasized to the liver and suspected cirrhosis.  He continues to do well is vigorous doing PT online and tolerating his right inguinal hernia using tape to keep it reduced.  He has not been having much in the way of abdominal pain and knows that he has had are minor.  Weight as below.  Transient ankle swelling recently on the left only.  He really feels like he is doing well he is eating 3 meals a day and getting outside with his walker.  CA 19-9 is stable in the 700s.  Still in observation mode after chemotherapy.  Followed by Dr. Benay [name]. ? ?Wt Readings from Last 3 Encounters:  ?04/11/22 155 lb (70.3 kg)  ?03/12/22 154 lb 12.8 oz (70.2 kg)  ?01/30/22 149 lb (67.6 kg)  ? ?Lab Results  ?Component Value Date  ? CREATININE 1.32 (H) 03/12/2022  ? BUN 33 (H) 03/12/2022  ? NA 137 03/12/2022  ? K 4.2 03/12/2022  ? CL 106 03/12/2022  ? CO2 22 03/12/2022  ? ?Lab Results  ?Component Value Date  ? ALT 27 03/12/2022  ? AST 25 03/12/2022  ? ALKPHOS 105 03/12/2022  ? BILITOT 0.6 03/12/2022  ? ?Lab Results  ?Component Value Date  ? WBC 9.0 10/19/2021  ? HGB 12.0 (L) 10/19/2021  ? HCT 38.0 (L) 10/19/2021  ? MCV 84.1 10/19/2021  ? PLT 177 10/19/2021  ? ? ?Allergies  ?Allergen Reactions  ? Amlodipine Other (See Comments)  ? Gentamycin [Gentamicin Sulfate]   ?  "Inner ears problems after Gent infusion" , also "unsteady mobility"  ? Hydrocodone   ? Aspirin Other (See  Comments)  ?  High doses cause stomach upset  ? ?Current Meds  ?Medication Sig  ? acetaminophen (TYLENOL) 325 MG tablet 1 tablet as needed  ? furosemide (LASIX) 20 MG tablet Take 10 mg by mouth every other day. 10 mg daily  ? lidocaine-prilocaine (EMLA) cream Apply 1 application topically as directed. Apply 1 hour prior to IV stick and cover with plastic wrap  ? omeprazole (PRILOSEC) 20 MG capsule Take 1 capsule by mouth daily.  ? spironolactone (ALDACTONE) 25 MG tablet Take 1 tablet (25 mg total) by mouth every other day.  ? ?Past Medical History:  ?Diagnosis Date  ? Ascending aortic aneurysm ([place]) 01/13/2014  ? stable at 4.7 cm diameter since 2009  ? Asystole x2, s/p pacemaker 01/13/2014  ? Bradycardia   ? permanent pacemaker 11/22/08 St.Jude  ? Family history of adverse reaction to anesthesia   ? brother died after anesthesia / nose polyp removal at [facility] (40 years ago)  ? HTN (hypertension) 01/13/2014  ? Hyperlipidemia   ? Hypertension   ? Malignant ascites   ? Pacemaker - leads 1987, last generator 2009 St. Jude dual-chamber 01/13/2014  ? Atrial lead model 433-01, ventricular lead model 431-01 implanted November 1987 Right ventricular lead dysfunction with low impedance  and mediocre sensing but satisfactory pacing threshold  ? Pacemaker lead malfunction 01/13/2014  ? Chronic low impedance and poor sensing of the ventricular lead  ? Pancreatic cancer ([place])   ? Metastatic to liver  ? ?Past Surgical History:  ?Procedure Laterality Date  ? IR IMAGING GUIDED PORT INSERTION  05/24/2020  ? IR PARACENTESIS  10/26/2020  ? IR PARACENTESIS  12/12/2020  ? IR PARACENTESIS  01/22/2021  ? IR PARACENTESIS  02/02/2021  ? IR PARACENTESIS  02/14/2021  ? IR PARACENTESIS  02/23/2021  ? IR PARACENTESIS  03/06/2021  ? IR PARACENTESIS  03/14/2021  ? IR PARACENTESIS  03/22/2021  ? IR PARACENTESIS  03/29/2021  ? IR PARACENTESIS  04/06/2021  ? IR PARACENTESIS  04/17/2021  ? IR PARACENTESIS  04/27/2021  ? IR PARACENTESIS  05/09/2021  ? IR PARACENTESIS  05/22/2021  ?  IR PARACENTESIS  09/07/2021  ? PERMANENT PACEMAKER GENERATOR CHANGE  11/22/08  ? ST. Jude  ? PERMANENT PACEMAKER INSERTION  03/04/97  ? ST. Jude  ? US ECHOCARDIOGRAPHY  05/16/11  ? AS mild to mod.,TR mild to mod., EF => 55%.  ? ?Social History  ? ?Social History Narrative  ? Married, 2 sons 2 daughters  ? 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 No alcohol tobacco or drug use  ? ?family history includes Heart attack in his father; Ovarian cancer in his mother. ? ? ?Review of Systems ?As above ? ?Objective:  ? Physical Exam ?BP 138/70   Pulse 72   Ht '5\' 5"'$  (1.651 m)   Wt 155 lb (70.3 kg)   SpO2 98%   BMI 25.79 kg/m?  ?Elderly man in no acute distress.  He is able to stand under his own power but does require assistance to sit up onto the exam table. ?The eyes are anicteric ?Lungs were clear ?Heart sounds were normal ?Abdomen soft possible small ascites difficult to tell, right inguinal hernia is taped and palpable but nontender soft. ? ?

## 2022-04-30 ENCOUNTER — Inpatient Hospital Stay: Payer: Medicare Other | Attending: Oncology

## 2022-04-30 ENCOUNTER — Inpatient Hospital Stay (HOSPITAL_BASED_OUTPATIENT_CLINIC_OR_DEPARTMENT_OTHER): Payer: Medicare Other | Admitting: Nurse Practitioner

## 2022-04-30 ENCOUNTER — Encounter: Payer: Self-pay | Admitting: Nurse Practitioner

## 2022-04-30 VITALS — BP 148/73 | HR 65 | Temp 98.2°F | Resp 18 | Ht 65.0 in | Wt 155.0 lb

## 2022-04-30 DIAGNOSIS — C787 Secondary malignant neoplasm of liver and intrahepatic bile duct: Secondary | ICD-10-CM | POA: Diagnosis not present

## 2022-04-30 DIAGNOSIS — I10 Essential (primary) hypertension: Secondary | ICD-10-CM | POA: Insufficient documentation

## 2022-04-30 DIAGNOSIS — J90 Pleural effusion, not elsewhere classified: Secondary | ICD-10-CM | POA: Diagnosis not present

## 2022-04-30 DIAGNOSIS — C252 Malignant neoplasm of tail of pancreas: Secondary | ICD-10-CM

## 2022-04-30 DIAGNOSIS — I7121 Aneurysm of the ascending aorta, without rupture: Secondary | ICD-10-CM | POA: Insufficient documentation

## 2022-04-30 DIAGNOSIS — E785 Hyperlipidemia, unspecified: Secondary | ICD-10-CM | POA: Insufficient documentation

## 2022-04-30 DIAGNOSIS — R188 Other ascites: Secondary | ICD-10-CM | POA: Insufficient documentation

## 2022-04-30 DIAGNOSIS — K219 Gastro-esophageal reflux disease without esophagitis: Secondary | ICD-10-CM | POA: Insufficient documentation

## 2022-04-30 DIAGNOSIS — Z95828 Presence of other vascular implants and grafts: Secondary | ICD-10-CM

## 2022-04-30 MED ORDER — SODIUM CHLORIDE 0.9% FLUSH: 10.0000 mL | INTRAVENOUS | Status: DC | PRN

## 2022-04-30 MED ORDER — HEPARIN SOD (PORK) LOCK FLUSH 100 UNIT/ML IV SOLN: 500.0000 [IU] | Freq: Once | INTRAVENOUS | Status: DC | PRN

## 2022-04-30 NOTE — Progress Notes (Signed)
?[place] ?OFFICE PROGRESS NOTE ? ? ?Diagnosis: Pancreas cancer ? ?INTERVAL HISTORY:  ? ?Gary [name] returns as scheduled.  In general he has been feeling well.  While straining to have a bowel movement yesterday he developed right groin pain and became diaphoretic.  A neighbor came over and "pushed the hernia back in".  Symptoms subsequently resolved.  He reports a good appetite.  No recent paracentesis.  He notes he no longer has to adjust his belt size. ? ?Objective: ? ?Vital signs in last 24 hours: ? ?Blood pressure (!) 148/73, pulse 65, temperature 98.2 ?F (36.8 ?C), temperature source Oral, resp. rate 18, height '5\' 5"'$  (1.651 m), weight 155 lb (70.3 kg), SpO2 100 %. ?  ? ?Resp: Lungs clear bilaterally. ?Cardio: Regular rate and rhythm. ?GI: Abdomen soft and nontender.  No hepatosplenomegaly.  No apparent ascites. ?Vascular: No leg edema. ? ? ?Lab Results: ? ?Lab Results  ?Component Value Date  ? WBC 9.0 10/19/2021  ? HGB 12.0 (L) 10/19/2021  ? HCT 38.0 (L) 10/19/2021  ? MCV 84.1 10/19/2021  ? PLT 177 10/19/2021  ? NEUTROABS 6.2 08/02/2021  ? ? ?Imaging: ? ?No results found. ? ?Medications: I have reviewed the patient's current medications. ? ?Assessment/Plan: ?Pancreas tail lesion, multiple liver lesions ?Chest CT 04/12/2020-ascending thoracic aortic aneurysm; hypovascular lesion tail of the pancreas, new compared to the prior examination.  Multiple poorly defined hypovascular lesions scattered throughout the liver ?CT abdomen/pelvis 05/02/2020-hypoenhancing lesion of the pancreatic tail measuring approximately 2.4 x 1.9 cm; enlarged portacaval lymph nodes measuring up to 1.9 x 1.4 cm; numerous hypodense lesions throughout the liver, index lesion of the liver dome measuring 1.7 x 1.2 cm. ?Ultrasound-guided biopsy of a left liver lesion 05/10/2020-poorly differentiated carcinoma consistent with a pancreatobiliary primary ?Markedly elevated CA 19-9  ?Cycle 1 day 1 gemcitabine/Abraxane  05/26/2020 ?Cycle 1 day 15 gemcitabine/Abraxane 06/12/2020 ?Cycle 2 day 1 gemcitabine/Abraxane 07/04/2020 (chemotherapy doses reduced, antiemetic regimen adjusted) ?Cycle 2 day 15 Gemcitabine/Abraxane 07/18/2020 (continue same dose reduction, reduce post-treatment steroids due to insomnia) ?Cycle 3 day 1 gem/abraxane 08/02/20 ?Cycle 3 day 15 gem/Abraxane 08/15/2020 ?CTs abdomen/pelvis 08/25/2020-pancreas tail mass and portacaval lymphadenopathy decreased.  Liver metastases reported as increased, further review with no significant change in the majority of liver lesions and a decrease in the size of a few lesions, no new margin lesions ?Cycle 4-day 1 gemcitabine/Abraxane 09/05/2020 ?Paracentesis 09/26/2020-reactive mesothelial cells ?Paracentesis 10/09/2020-reactive mesothelial cells ?Cycle 5 gemcitabine/Abraxane 10/12/2020 ?Cycle 6 gemcitabine/Abraxane 11/01/2020 ?Cycle 7 gemcitabine/Abraxane 11/22/2020 ?Cycle 8 gemcitabine/Abraxane 12/13/2020 ?Cycle 9 gemcitabine/Abraxane 01/03/2021 ?CT abdomen/pelvis 01/29/2021-decrease in pancreas tail mass, decrease in size of multiple hepatic metastases, decreased size of hepatoduodenal ligament lymph nodes, progressive ascites ?Cycle 10 gemcitabine/Abraxane 01/31/2021 ?CT abdomen/pelvis 04/13/2021- generally stable liver lesions, some have slightly decreased and others slightly increased compared to the CT 01/29/2021, pancreas mass not appreciated, large volume ascites ?CT abdomen/pelvis 08/02/2021-multiple liver lesions-some are unchanged, fluid density lesions are present-some larger-treated metastases?,  Increased small right pleural effusion, ascites, tail of pancreas lesion ?Not measurable ?CT abdomen/pelvis 08/31/2021-increased size of right inguinal hernia containing ascites, unchanged ascites and small right pleural effusion, stable liver lesions, no pancreas mass ?CT abdomen/pelvis 01/04/2022-unchanged hypoenhancing liver lesions; moderate volume ascites; primary pancreatic malignancy  not clearly visualized; coarse nodular contour of liver; large hiatal hernia ?  ?  ?Thoracic aortic aneurysm ?Hypertension ?Hyperlipidemia ?History of bradycardia status post permanent pacemaker ?Mother had cervical cancer ?Pitting lower leg edema ?GERD, on omeprazole  ?Right upper back inflamed sebaceous cyst-culture from  cyst drainage 10/23/2020-2 staph species, antibiotic changed to ciprofloxacin 10/27/2020, status post I&D procedure 11/17/2020 ?Refractory ascites-trial of spironolactone 03/23/2021; spironolactone and Lasix 04/04/2021--improved 06/21/2021 ?  ?  ? ?Disposition: Gary [name] appears stable.  There is no clinical evidence for progression of the metastatic pancreas cancer.  He will return for a CA 19-9 and follow-up visit in 6 weeks.  He continues follow-up with Dr. Carlean Purl for management of ascites. ? ? ? ?Ned Card ANP/GNP-BC  ? ?04/30/2022  ?2:35 PM ? ? ? ? ? ? ? ?

## 2022-05-08 DIAGNOSIS — N400 Enlarged prostate without lower urinary tract symptoms: Secondary | ICD-10-CM | POA: Diagnosis not present

## 2022-05-08 DIAGNOSIS — R310 Gross hematuria: Secondary | ICD-10-CM | POA: Diagnosis not present

## 2022-05-17 NOTE — Patient Outreach (Signed)
[facility]) Care Management  05/17/2022  Gary [name] [birth year] [id number]   Received referral for Care Management from Insurance plan. Assigned patient to [name], RN Care Coordinator for follow up.  [place] Management Assistant [phone]

## 2022-06-10 ENCOUNTER — Ambulatory Visit: Payer: Medicare Other | Admitting: Internal Medicine

## 2022-06-10 DIAGNOSIS — L219 Seborrheic dermatitis, unspecified: Secondary | ICD-10-CM | POA: Diagnosis not present

## 2022-06-10 DIAGNOSIS — C259 Malignant neoplasm of pancreas, unspecified: Secondary | ICD-10-CM | POA: Diagnosis not present

## 2022-06-10 DIAGNOSIS — R188 Other ascites: Secondary | ICD-10-CM | POA: Diagnosis not present

## 2022-06-10 DIAGNOSIS — C787 Secondary malignant neoplasm of liver and intrahepatic bile duct: Secondary | ICD-10-CM | POA: Diagnosis not present

## 2022-06-10 DIAGNOSIS — K4090 Unilateral inguinal hernia, without obstruction or gangrene, not specified as recurrent: Secondary | ICD-10-CM | POA: Diagnosis not present

## 2022-06-10 DIAGNOSIS — I10 Essential (primary) hypertension: Secondary | ICD-10-CM | POA: Diagnosis not present

## 2022-06-10 DIAGNOSIS — R5383 Other fatigue: Secondary | ICD-10-CM | POA: Diagnosis not present

## 2022-06-12 ENCOUNTER — Other Ambulatory Visit: Payer: Self-pay | Admitting: *Deleted

## 2022-06-12 NOTE — Patient Outreach (Signed)
[place] [facility]) Care Management  06/12/2022  THOMSON HERBERS 1932-05-24 [id number]  Initial telephone outreach foir [place] Management services. Talked with Mrs. [place] and also their son, Ulice Dash. Described our services and answered questions. They would like some written information. NP will send our brochure and a successful outreach letter.  Eulah Pont. Myrtie Neither, MSN, [facility] Gerontological Nurse Practitioner [facility] Care Management [phone]

## 2022-06-14 ENCOUNTER — Inpatient Hospital Stay (HOSPITAL_BASED_OUTPATIENT_CLINIC_OR_DEPARTMENT_OTHER): Payer: Medicare Other | Admitting: Oncology

## 2022-06-14 ENCOUNTER — Inpatient Hospital Stay: Payer: Medicare Other | Attending: Oncology

## 2022-06-14 ENCOUNTER — Inpatient Hospital Stay: Payer: Medicare Other

## 2022-06-14 VITALS — BP 155/86 | HR 64 | Temp 97.8°F | Resp 20 | Ht 65.0 in | Wt 160.8 lb

## 2022-06-14 DIAGNOSIS — C787 Secondary malignant neoplasm of liver and intrahepatic bile duct: Secondary | ICD-10-CM | POA: Diagnosis not present

## 2022-06-14 DIAGNOSIS — R188 Other ascites: Secondary | ICD-10-CM | POA: Insufficient documentation

## 2022-06-14 DIAGNOSIS — C252 Malignant neoplasm of tail of pancreas: Secondary | ICD-10-CM

## 2022-06-14 NOTE — Progress Notes (Signed)
[place] OFFICE PROGRESS NOTE   Diagnosis: Pancreas cancer  INTERVAL HISTORY:   Gary [name] returns as scheduled.  Good appetite.  He is participating in home physical therapy.  No new complaint.  He takes furosemide and spironolactone every other day.  Objective:  Vital signs in last 24 hours:  Blood pressure (!) 155/86, pulse 64, temperature 97.8 F (36.6 C), temperature source Oral, resp. rate 20, height '5\' 5"'$  (1.651 m), weight 160 lb 12.8 oz (72.9 kg), SpO2 98 %.    Resp: Lungs clear bilaterally Cardio: Regular rate and rhythm GI: Nondistended, no hepatosplenomegaly, nontender Vascular: No leg edema`   Portacath/PICC-without erythema  Lab Results:  Lab Results  Component Value Date   WBC 9.0 10/19/2021   HGB 12.0 (L) 10/19/2021   HCT 38.0 (L) 10/19/2021   MCV 84.1 10/19/2021   PLT 177 10/19/2021   NEUTROABS 6.2 08/02/2021    CMP  Lab Results  Component Value Date   NA 137 03/12/2022   K 4.2 03/12/2022   CL 106 03/12/2022   CO2 22 03/12/2022   GLUCOSE 91 03/12/2022   BUN 33 (H) 03/12/2022   CREATININE 1.32 (H) 03/12/2022   CALCIUM 9.2 03/12/2022   PROT 7.6 03/12/2022   ALBUMIN 3.8 03/12/2022   AST 25 03/12/2022   ALT 27 03/12/2022   ALKPHOS 105 03/12/2022   BILITOT 0.6 03/12/2022   GFRNONAA 52 (L) 03/12/2022   GFRAA >60 09/05/2020    Lab Results  Component Value Date   FAO130 743 (H) 03/12/2022    Lab Results  Component Value Date   INR 1.2 (H) 05/01/2021   LABPROT 12.9 05/01/2021    Imaging:  No results found.  Medications: I have reviewed the patient's current medications.   Assessment/Plan: Pancreas tail lesion, multiple liver lesions Chest CT 04/12/2020-ascending thoracic aortic aneurysm; hypovascular lesion tail of the pancreas, new compared to the prior examination.  Multiple poorly defined hypovascular lesions scattered throughout the liver CT abdomen/pelvis 05/02/2020-hypoenhancing lesion of the pancreatic tail  measuring approximately 2.4 x 1.9 cm; enlarged portacaval lymph nodes measuring up to 1.9 x 1.4 cm; numerous hypodense lesions throughout the liver, index lesion of the liver dome measuring 1.7 x 1.2 cm. Ultrasound-guided biopsy of a left liver lesion 05/10/2020-poorly differentiated carcinoma consistent with a pancreatobiliary primary Markedly elevated CA 19-9  Cycle 1 day 1 gemcitabine/Abraxane 05/26/2020 Cycle 1 day 15 gemcitabine/Abraxane 06/12/2020 Cycle 2 day 1 gemcitabine/Abraxane 07/04/2020 (chemotherapy doses reduced, antiemetic regimen adjusted) Cycle 2 day 15 Gemcitabine/Abraxane 07/18/2020 (continue same dose reduction, reduce post-treatment steroids due to insomnia) Cycle 3 day 1 gem/abraxane 08/02/20 Cycle 3 day 15 gem/Abraxane 08/15/2020 CTs abdomen/pelvis 08/25/2020-pancreas tail mass and portacaval lymphadenopathy decreased.  Liver metastases reported as increased, further review with no significant change in the majority of liver lesions and a decrease in the size of a few lesions, no new margin lesions Cycle 4-day 1 gemcitabine/Abraxane 09/05/2020 Paracentesis 09/26/2020-reactive mesothelial cells Paracentesis 10/09/2020-reactive mesothelial cells Cycle 5 gemcitabine/Abraxane 10/12/2020 Cycle 6 gemcitabine/Abraxane 11/01/2020 Cycle 7 gemcitabine/Abraxane 11/22/2020 Cycle 8 gemcitabine/Abraxane 12/13/2020 Cycle 9 gemcitabine/Abraxane 01/03/2021 CT abdomen/pelvis 01/29/2021-decrease in pancreas tail mass, decrease in size of multiple hepatic metastases, decreased size of hepatoduodenal ligament lymph nodes, progressive ascites Cycle 10 gemcitabine/Abraxane 01/31/2021 CT abdomen/pelvis 04/13/2021- generally stable liver lesions, some have slightly decreased and others slightly increased compared to the CT 01/29/2021, pancreas mass not appreciated, large volume ascites CT abdomen/pelvis 08/02/2021-multiple liver lesions-some are unchanged, fluid density lesions are present-some larger-treated  metastases?,  Increased small right  pleural effusion, ascites, tail of pancreas lesion Not measurable CT abdomen/pelvis 08/31/2021-increased size of right inguinal hernia containing ascites, unchanged ascites and small right pleural effusion, stable liver lesions, no pancreas mass CT abdomen/pelvis 01/04/2022-unchanged hypoenhancing liver lesions; moderate volume ascites; primary pancreatic malignancy not clearly visualized; coarse nodular contour of liver; large hiatal hernia     Thoracic aortic aneurysm Hypertension Hyperlipidemia History of bradycardia status post permanent pacemaker Mother had cervical cancer Pitting lower leg edema GERD, on omeprazole  Right upper back inflamed sebaceous cyst-culture from cyst drainage 10/23/2020-2 staph species, antibiotic changed to ciprofloxacin 10/27/2020, status post I&D procedure 11/17/2020 Refractory ascites-trial of spironolactone 03/23/2021; spironolactone and Lasix 04/04/2021--improved 06/21/2021     Disposition: Gary [name] appears stable.  There is no clinical evidence for progression of the pancreas cancer.  We will follow-up on the CA 19-9 from today.  He will return for an office visit and Port-A-Cath flush in 8 weeks.  He is scheduled to see Dr. Carlean Purl next month.  Betsy Coder, MD  06/14/2022  12:31 PM

## 2022-06-15 LAB — CANCER ANTIGEN 19-9: CA 19-9: 860 U/mL — ABNORMAL HIGH (ref 0–35)

## 2022-06-27 DIAGNOSIS — L0202 Furuncle of face: Secondary | ICD-10-CM | POA: Diagnosis not present

## 2022-06-27 DIAGNOSIS — L905 Scar conditions and fibrosis of skin: Secondary | ICD-10-CM | POA: Diagnosis not present

## 2022-06-27 DIAGNOSIS — L02821 Furuncle of head [any part, except face]: Secondary | ICD-10-CM | POA: Diagnosis not present

## 2022-06-27 DIAGNOSIS — L728 Other follicular cysts of the skin and subcutaneous tissue: Secondary | ICD-10-CM | POA: Diagnosis not present

## 2022-06-27 DIAGNOSIS — L218 Other seborrheic dermatitis: Secondary | ICD-10-CM | POA: Diagnosis not present

## 2022-06-28 DIAGNOSIS — L723 Sebaceous cyst: Secondary | ICD-10-CM | POA: Diagnosis not present

## 2022-07-01 DIAGNOSIS — M19071 Primary osteoarthritis, right ankle and foot: Secondary | ICD-10-CM | POA: Diagnosis not present

## 2022-07-01 DIAGNOSIS — M19072 Primary osteoarthritis, left ankle and foot: Secondary | ICD-10-CM | POA: Diagnosis not present

## 2022-07-02 ENCOUNTER — Encounter: Payer: Self-pay | Admitting: Internal Medicine

## 2022-07-02 ENCOUNTER — Ambulatory Visit (INDEPENDENT_AMBULATORY_CARE_PROVIDER_SITE_OTHER): Payer: Medicare Other | Admitting: Internal Medicine

## 2022-07-02 VITALS — BP 110/62 | HR 60 | Ht 65.0 in | Wt 159.8 lb

## 2022-07-02 DIAGNOSIS — G4700 Insomnia, unspecified: Secondary | ICD-10-CM

## 2022-07-02 DIAGNOSIS — R188 Other ascites: Secondary | ICD-10-CM | POA: Diagnosis not present

## 2022-07-02 DIAGNOSIS — C787 Secondary malignant neoplasm of liver and intrahepatic bile duct: Secondary | ICD-10-CM

## 2022-07-02 DIAGNOSIS — C259 Malignant neoplasm of pancreas, unspecified: Secondary | ICD-10-CM | POA: Diagnosis not present

## 2022-07-02 NOTE — Patient Instructions (Signed)
Great to see you all today.   It is okay per Dr Carlean Purl to occasionally skip your fluid pill when your going somewhere.  Call us in November for a mid January appointment.   I appreciate the opportunity to care for you. Silvano Rusk, MD, [facility]

## 2022-07-02 NOTE — Progress Notes (Signed)
Gary [name] 88 y.o. 09-06-1932 [id number]  Assessment & Plan:   Encounter Diagnoses  Name Primary?   Other ascites Yes   Pancreatic cancer metastasized to liver (HCC)    Insomnia, unspecified type    He is stable.  He has had a great response to his pancreatic cancer treatment and has been stable though his CA 19-9 is slightly increased clinically he is about as well as he can be, I think.  He asks about stopping the diuretics.  I told him I think he should continue his on a low-dose every other day and he required these to control his ascites.  Kidney function is stable with GFR 50.  Some days if he has an event with family etc. he can skip a day to reduce urination associated with diuretics.  He does sleep until midday and I have advised him again as in the past that taking the diuretics in the morning may improve the timing of urination symptoms associated and their impact on his quality of life.  Regarding insomnia I suggested he try to reduce daytime napping.  Return in approximately 6 months sooner as needed.  CC: [name], MD  Subjective:   Chief Complaint: Follow-up of ascites in the setting of pancreatic cancer  HPI 88 year old white man here with son and wife, he has a history of pancreatic cancer from the tail metastasized to the liver was treated by Dr. Benay Spice and has been stable without advancing disease for some time now.  He has had ascites that I originally thought might have been due to the cancer but really has acted more like a portal hypertension/cirrhosis problem.  He is on every other day low-dose spironolactone and furosemide and has been stable with respect to weight.  In the past he has had abdominal pain problems and asymptomatic inguinal hernia but he has done well.  He does complain of insomnia and also itching in the scalp.  He wonders if previous Dupixent which was very successful in relieving hives and itching last year, has caused this scalp  itching.  He has not taken the [place] in 8 months.  He also goes on to tell me that he has had multiple pacemakers placed in the past but they are nonfunctional.  Having some pain in the site of a sebaceous cyst in the right posterior shoulder saw surgery PA July 14, has follow-up with Dr. Marlou Starks.  Warm compresses advised.  Reports appetite is good and he continues to do PT at home over the Internet from a physical therapist in [place]. Wt Readings from Last 3 Encounters:  07/02/22 159 lb 12.8 oz (72.5 kg)  06/14/22 160 lb 12.8 oz (72.9 kg)  04/30/22 155 lb (70.3 kg)   CT abdomen/pelvis 01/04/2022-unchanged hypoenhancing liver lesions; moderate volume ascites; primary pancreatic malignancy not clearly visualized; coarse nodular contour of liver; large hiatal hernia Office visit with Dr. Benay Spice 06/14/2022 he was stable.  In observation no active therapy.  CA 19-9 from that date up to 860 from 743.  Labs at primary care June 26 hemoglobin 12.2 white count platelets normal GFR 50 creatinine 1.36 BUN 30 potassium 4.4 sodium 139 transaminases bilirubin alk phos all normal as well as TSH  Allergies  Allergen Reactions   Amlodipine Other (See Comments)   Gentamycin [Gentamicin Sulfate]     "Inner ears problems after Gent infusion" , also "unsteady mobility"   Hydrocodone     Other reaction(s): unknown   Aspirin Other (See Comments)  High doses cause stomach upset   Current Meds  Medication Sig   acetaminophen (TYLENOL) 325 MG tablet 1 tablet as needed   furosemide (LASIX) 20 MG tablet Take 10 mg by mouth every other day. 10 mg daily   lidocaine-prilocaine (EMLA) cream Apply 1 application topically as directed. Apply 1 hour prior to IV stick and cover with plastic wrap   omeprazole (PRILOSEC) 20 MG capsule Take 1 capsule by mouth daily.   spironolactone (ALDACTONE) 25 MG tablet Take 1 tablet (25 mg total) by mouth every other day.   Past Medical History:  Diagnosis Date   Ascending aortic  aneurysm ([place]) 01/13/2014   stable at 4.7 cm diameter since 2009   Asystole x2, s/p pacemaker 01/13/2014   Bradycardia    permanent pacemaker 11/22/08 St.Jude   Family history of adverse reaction to anesthesia    brother died after anesthesia / nose polyp removal at [facility] (40 years ago)   HTN (hypertension) 01/13/2014   Hyperlipidemia    Hypertension    Malignant ascites    Pacemaker - leads 1987, last generator 2009 St. Jude dual-chamber 01/13/2014   Atrial lead model 433-01, ventricular lead model 431-01 implanted November 1987 Right ventricular lead dysfunction with low impedance and mediocre sensing but satisfactory pacing threshold   Pacemaker lead malfunction 01/13/2014   Chronic low impedance and poor sensing of the ventricular lead   Pancreatic cancer ([place])    Metastatic to liver   Past Surgical History:  Procedure Laterality Date   IR IMAGING GUIDED PORT INSERTION  05/24/2020   IR PARACENTESIS  10/26/2020   IR PARACENTESIS  12/12/2020   IR PARACENTESIS  01/22/2021   IR PARACENTESIS  02/02/2021   IR PARACENTESIS  02/14/2021   IR PARACENTESIS  02/23/2021   IR PARACENTESIS  03/06/2021   IR PARACENTESIS  03/14/2021   IR PARACENTESIS  03/22/2021   IR PARACENTESIS  03/29/2021   IR PARACENTESIS  04/06/2021   IR PARACENTESIS  04/17/2021   IR PARACENTESIS  04/27/2021   IR PARACENTESIS  05/09/2021   IR PARACENTESIS  05/22/2021   IR PARACENTESIS  09/07/2021   PERMANENT PACEMAKER GENERATOR CHANGE  11/22/08   ST. Jude   PERMANENT PACEMAKER INSERTION  03/04/97   ST. Jude   US ECHOCARDIOGRAPHY  05/16/11   AS mild to mod.,TR mild to mod., EF => 55%.   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   Review of Systems As per HPI  Objective:   Physical Exam BP 110/62   Pulse 60   Ht  5' 5" (1.651 m)   Wt 159 lb 12.8 oz (72.5 kg)   BMI 26.59 kg/m  [name] man, elderly no acute distress slightly hard of hearing He is able to get on the exam table with minimal assistance. Lungs are clear Heart sounds are normal S1-S2 no rubs murmurs or gallops The abdomen is slightly distended I think there is some ascites present.  He has a right inguinal hernia palpable and reducible above the genital area as in the past. Right shoulder posterior there is a small lesion consistent with a suspicious cyst, it is firm about the size of a dime with a comedone.  It does not appear to be significantly tender. Trace lower  extremity edema at the ankles  He is alert and oriented x3  35 minutes total time

## 2022-07-08 ENCOUNTER — Other Ambulatory Visit: Payer: Self-pay

## 2022-07-08 DIAGNOSIS — M7989 Other specified soft tissue disorders: Secondary | ICD-10-CM | POA: Diagnosis not present

## 2022-07-08 DIAGNOSIS — M25571 Pain in right ankle and joints of right foot: Secondary | ICD-10-CM | POA: Diagnosis not present

## 2022-07-08 DIAGNOSIS — M79671 Pain in right foot: Secondary | ICD-10-CM | POA: Diagnosis not present

## 2022-07-23 DIAGNOSIS — H5213 Myopia, bilateral: Secondary | ICD-10-CM | POA: Diagnosis not present

## 2022-07-23 DIAGNOSIS — Z961 Presence of intraocular lens: Secondary | ICD-10-CM | POA: Diagnosis not present

## 2022-07-24 DIAGNOSIS — L723 Sebaceous cyst: Secondary | ICD-10-CM | POA: Diagnosis not present

## 2022-08-13 ENCOUNTER — Inpatient Hospital Stay (HOSPITAL_BASED_OUTPATIENT_CLINIC_OR_DEPARTMENT_OTHER): Payer: Medicare Other | Admitting: Oncology

## 2022-08-13 ENCOUNTER — Inpatient Hospital Stay: Payer: Medicare Other | Attending: Oncology

## 2022-08-13 ENCOUNTER — Inpatient Hospital Stay: Payer: Medicare Other

## 2022-08-13 VITALS — BP 143/67 | HR 64 | Temp 97.9°F | Resp 18 | Wt 158.6 lb

## 2022-08-13 DIAGNOSIS — I10 Essential (primary) hypertension: Secondary | ICD-10-CM | POA: Diagnosis not present

## 2022-08-13 DIAGNOSIS — C252 Malignant neoplasm of tail of pancreas: Secondary | ICD-10-CM

## 2022-08-13 DIAGNOSIS — E785 Hyperlipidemia, unspecified: Secondary | ICD-10-CM | POA: Insufficient documentation

## 2022-08-13 DIAGNOSIS — C787 Secondary malignant neoplasm of liver and intrahepatic bile duct: Secondary | ICD-10-CM | POA: Diagnosis not present

## 2022-08-13 DIAGNOSIS — Z452 Encounter for adjustment and management of vascular access device: Secondary | ICD-10-CM | POA: Insufficient documentation

## 2022-08-13 DIAGNOSIS — Z95828 Presence of other vascular implants and grafts: Secondary | ICD-10-CM

## 2022-08-13 LAB — BASIC METABOLIC PANEL - CANCER CENTER ONLY
Anion gap: 9 (ref 5–15)
BUN: 27 mg/dL — ABNORMAL HIGH (ref 8–23)
CO2: 23 mmol/L (ref 22–32)
Calcium: 9.3 mg/dL (ref 8.9–10.3)
Chloride: 106 mmol/L (ref 98–111)
Creatinine: 1.34 mg/dL — ABNORMAL HIGH (ref 0.61–1.24)
GFR, Estimated: 51 mL/min — ABNORMAL LOW (ref >60)
Glucose, Bld: 88 mg/dL (ref 70–99)
Potassium: 4.2 mmol/L (ref 3.5–5.1)
Sodium: 138 mmol/L (ref 135–145)

## 2022-08-13 MED ORDER — HEPARIN SOD (PORK) LOCK FLUSH 100 UNIT/ML IV SOLN
500.0000 [IU] | Freq: Once | INTRAVENOUS | Status: AC | PRN
  Administered 2022-08-13: 500 [IU]

## 2022-08-13 MED ORDER — SODIUM CHLORIDE 0.9% FLUSH
10.0000 mL | INTRAVENOUS | Status: DC | PRN
  Administered 2022-08-13: 10 mL

## 2022-08-13 NOTE — Progress Notes (Signed)
[facility] OFFICE PROGRESS NOTE   Diagnosis: Pancreas cancer  INTERVAL HISTORY:   Mr. Chapa returns as scheduled.  He generally feels well.  He is performing physical therapy twice weekly.  He developed discomfort at the medial aspect of the right knee after physical therapy yesterday.  He has "arthritis "at the right ankle.  Objective:  Vital signs in last 24 hours:  Blood pressure (!) 143/67, pulse 64, temperature 97.9 F (36.6 C), temperature source Skin, resp. rate 18, weight 158 lb 9.6 oz (71.9 kg), SpO2 100 %.    Lymphatics: No cervical, supraclavicular, axillary, or inguinal nodes Resp: Lungs clear bilaterally  Cardio: Regular rate and rhythm GI: No hepatosplenomegaly, no mass, nontender, no apparent ascites Vascular: No leg edema, no erythema or palpable cord at the right leg    Portacath/PICC-without erythema  Lab Results:  Lab Results  Component Value Date   WBC 9.0 10/19/2021   HGB 12.0 (L) 10/19/2021   HCT 38.0 (L) 10/19/2021   MCV 84.1 10/19/2021   PLT 177 10/19/2021   NEUTROABS 6.2 08/02/2021    CMP  Lab Results  Component Value Date   NA 138 08/13/2022   K 4.2 08/13/2022   CL 106 08/13/2022   CO2 23 08/13/2022   GLUCOSE 88 08/13/2022   BUN 27 (H) 08/13/2022   CREATININE 1.34 (H) 08/13/2022   CALCIUM 9.3 08/13/2022   PROT 7.6 03/12/2022   ALBUMIN 3.8 03/12/2022   AST 25 03/12/2022   ALT 27 03/12/2022   ALKPHOS 105 03/12/2022   BILITOT 0.6 03/12/2022   GFRNONAA 51 (L) 08/13/2022   GFRAA >60 09/05/2020    Lab Results  Component Value Date   [id number] 784 (H) 06/14/2022   Medications: I have reviewed the patient's current medications.   Assessment/Plan: Pancreas tail lesion, multiple liver lesions Chest CT 04/12/2020-ascending thoracic aortic aneurysm; hypovascular lesion tail of the pancreas, new compared to the prior examination.  Multiple poorly defined hypovascular lesions scattered throughout the liver CT  abdomen/pelvis 05/02/2020-hypoenhancing lesion of the pancreatic tail measuring approximately 2.4 x 1.9 cm; enlarged portacaval lymph nodes measuring up to 1.9 x 1.4 cm; numerous hypodense lesions throughout the liver, index lesion of the liver dome measuring 1.7 x 1.2 cm. Ultrasound-guided biopsy of a left liver lesion 05/10/2020-poorly differentiated carcinoma consistent with a pancreatobiliary primary Markedly elevated CA 19-9  Cycle 1 day 1 gemcitabine/Abraxane 05/26/2020 Cycle 1 day 15 gemcitabine/Abraxane 06/12/2020 Cycle 2 day 1 gemcitabine/Abraxane 07/04/2020 (chemotherapy doses reduced, antiemetic regimen adjusted) Cycle 2 day 15 Gemcitabine/Abraxane 07/18/2020 (continue same dose reduction, reduce post-treatment steroids due to insomnia) Cycle 3 day 1 gem/abraxane 08/02/20 Cycle 3 day 15 gem/Abraxane 08/15/2020 CTs abdomen/pelvis 08/25/2020-pancreas tail mass and portacaval lymphadenopathy decreased.  Liver metastases reported as increased, further review with no significant change in the majority of liver lesions and a decrease in the size of a few lesions, no new margin lesions Cycle 4-day 1 gemcitabine/Abraxane 09/05/2020 Paracentesis 09/26/2020-reactive mesothelial cells Paracentesis 10/09/2020-reactive mesothelial cells Cycle 5 gemcitabine/Abraxane 10/12/2020 Cycle 6 gemcitabine/Abraxane 11/01/2020 Cycle 7 gemcitabine/Abraxane 11/22/2020 Cycle 8 gemcitabine/Abraxane 12/13/2020 Cycle 9 gemcitabine/Abraxane 01/03/2021 CT abdomen/pelvis 01/29/2021-decrease in pancreas tail mass, decrease in size of multiple hepatic metastases, decreased size of hepatoduodenal ligament lymph nodes, progressive ascites Cycle 10 gemcitabine/Abraxane 01/31/2021 CT abdomen/pelvis 04/13/2021- generally stable liver lesions, some have slightly decreased and others slightly increased compared to the CT 01/29/2021, pancreas mass not appreciated, large volume ascites CT abdomen/pelvis 08/02/2021-multiple liver lesions-some are  unchanged, fluid density lesions are present-some larger-treated metastases?,  Increased small right pleural effusion, ascites, tail of pancreas lesion Not measurable CT abdomen/pelvis 08/31/2021-increased size of right inguinal hernia containing ascites, unchanged ascites and small right pleural effusion, stable liver lesions, no pancreas mass CT abdomen/pelvis 01/04/2022-unchanged hypoenhancing liver lesions; moderate volume ascites; primary pancreatic malignancy not clearly visualized; coarse nodular contour of liver; large hiatal hernia     Thoracic aortic aneurysm Hypertension Hyperlipidemia History of bradycardia status post permanent pacemaker Mother had cervical cancer Pitting lower leg edema GERD, on omeprazole  Right upper back inflamed sebaceous cyst-culture from cyst drainage 10/23/2020-2 staph species, antibiotic changed to ciprofloxacin 10/27/2020, status post I&D procedure 11/17/2020 Refractory ascites-trial of spironolactone 03/23/2021; spironolactone and Lasix 04/04/2021--improved 06/21/2021      Disposition: Gary [name] appears stable.  There is no clinical evidence for progression of the pancreas cancer.  We will follow-up on the CA 19-9 today.  The CA 19-9 was slightly higher when he was here on 06/14/2022.  We discussed the indication for a restaging CT.  We decided to hold off on CT scanning for now.  The creatinine is mildly elevated and he will require IV contrast to follow-up on liver lesions.  He will return for an office and lab visit in approximately 8 weeks.  Betsy Coder, MD  08/13/2022  2:59 PM

## 2022-08-15 LAB — CANCER ANTIGEN 19-9: CA 19-9: 787 U/mL — ABNORMAL HIGH (ref 0–35)

## 2022-09-01 ENCOUNTER — Other Ambulatory Visit: Payer: Self-pay | Admitting: Internal Medicine

## 2022-09-09 ENCOUNTER — Other Ambulatory Visit: Payer: Self-pay | Admitting: Internal Medicine

## 2022-09-12 DIAGNOSIS — Z23 Encounter for immunization: Secondary | ICD-10-CM | POA: Diagnosis not present

## 2022-09-19 ENCOUNTER — Other Ambulatory Visit (HOSPITAL_BASED_OUTPATIENT_CLINIC_OR_DEPARTMENT_OTHER): Payer: Self-pay

## 2022-09-19 DIAGNOSIS — Z23 Encounter for immunization: Secondary | ICD-10-CM | POA: Diagnosis not present

## 2022-09-19 MED ORDER — INFLUENZA VAC A&B SA ADJ QUAD 0.5 ML IM PRSY
PREFILLED_SYRINGE | INTRAMUSCULAR | 0 refills | Status: DC
  Filled 2022-09-19: qty 0.5, 1d supply, fill #0

## 2022-09-30 DIAGNOSIS — L602 Onychogryphosis: Secondary | ICD-10-CM | POA: Diagnosis not present

## 2022-09-30 DIAGNOSIS — M19071 Primary osteoarthritis, right ankle and foot: Secondary | ICD-10-CM | POA: Diagnosis not present

## 2022-09-30 DIAGNOSIS — I739 Peripheral vascular disease, unspecified: Secondary | ICD-10-CM | POA: Diagnosis not present

## 2022-09-30 DIAGNOSIS — M19072 Primary osteoarthritis, left ankle and foot: Secondary | ICD-10-CM | POA: Diagnosis not present

## 2022-10-02 DIAGNOSIS — Z8507 Personal history of malignant neoplasm of pancreas: Secondary | ICD-10-CM | POA: Diagnosis not present

## 2022-10-02 DIAGNOSIS — K4090 Unilateral inguinal hernia, without obstruction or gangrene, not specified as recurrent: Secondary | ICD-10-CM | POA: Diagnosis not present

## 2022-10-02 DIAGNOSIS — C787 Secondary malignant neoplasm of liver and intrahepatic bile duct: Secondary | ICD-10-CM | POA: Diagnosis not present

## 2022-10-02 DIAGNOSIS — I495 Sick sinus syndrome: Secondary | ICD-10-CM | POA: Diagnosis not present

## 2022-10-02 DIAGNOSIS — M25571 Pain in right ankle and joints of right foot: Secondary | ICD-10-CM | POA: Diagnosis not present

## 2022-10-02 DIAGNOSIS — E7800 Pure hypercholesterolemia, unspecified: Secondary | ICD-10-CM | POA: Diagnosis not present

## 2022-10-02 DIAGNOSIS — Z950 Presence of cardiac pacemaker: Secondary | ICD-10-CM | POA: Diagnosis not present

## 2022-10-02 DIAGNOSIS — I712 Thoracic aortic aneurysm, without rupture, unspecified: Secondary | ICD-10-CM | POA: Diagnosis not present

## 2022-10-02 DIAGNOSIS — Z0000 Encounter for general adult medical examination without abnormal findings: Secondary | ICD-10-CM | POA: Diagnosis not present

## 2022-10-02 DIAGNOSIS — R188 Other ascites: Secondary | ICD-10-CM | POA: Diagnosis not present

## 2022-10-02 DIAGNOSIS — I10 Essential (primary) hypertension: Secondary | ICD-10-CM | POA: Diagnosis not present

## 2022-10-03 DIAGNOSIS — M25561 Pain in right knee: Secondary | ICD-10-CM | POA: Diagnosis not present

## 2022-10-03 DIAGNOSIS — M25571 Pain in right ankle and joints of right foot: Secondary | ICD-10-CM | POA: Diagnosis not present

## 2022-10-08 ENCOUNTER — Inpatient Hospital Stay: Payer: Medicare Other | Attending: Oncology

## 2022-10-08 ENCOUNTER — Inpatient Hospital Stay (HOSPITAL_BASED_OUTPATIENT_CLINIC_OR_DEPARTMENT_OTHER): Payer: Medicare Other | Admitting: Nurse Practitioner

## 2022-10-08 ENCOUNTER — Inpatient Hospital Stay: Payer: Medicare Other

## 2022-10-08 ENCOUNTER — Encounter: Payer: Self-pay | Admitting: Nurse Practitioner

## 2022-10-08 VITALS — BP 149/65 | HR 64 | Temp 98.2°F | Resp 18 | Ht 65.0 in | Wt 157.2 lb

## 2022-10-08 DIAGNOSIS — R188 Other ascites: Secondary | ICD-10-CM | POA: Diagnosis not present

## 2022-10-08 DIAGNOSIS — I7121 Aneurysm of the ascending aorta, without rupture: Secondary | ICD-10-CM | POA: Diagnosis not present

## 2022-10-08 DIAGNOSIS — K219 Gastro-esophageal reflux disease without esophagitis: Secondary | ICD-10-CM | POA: Diagnosis not present

## 2022-10-08 DIAGNOSIS — Z9221 Personal history of antineoplastic chemotherapy: Secondary | ICD-10-CM | POA: Diagnosis not present

## 2022-10-08 DIAGNOSIS — C787 Secondary malignant neoplasm of liver and intrahepatic bile duct: Secondary | ICD-10-CM | POA: Insufficient documentation

## 2022-10-08 DIAGNOSIS — K5900 Constipation, unspecified: Secondary | ICD-10-CM | POA: Diagnosis not present

## 2022-10-08 DIAGNOSIS — C252 Malignant neoplasm of tail of pancreas: Secondary | ICD-10-CM

## 2022-10-08 DIAGNOSIS — Z9889 Other specified postprocedural states: Secondary | ICD-10-CM | POA: Insufficient documentation

## 2022-10-08 DIAGNOSIS — M25559 Pain in unspecified hip: Secondary | ICD-10-CM | POA: Insufficient documentation

## 2022-10-08 DIAGNOSIS — E785 Hyperlipidemia, unspecified: Secondary | ICD-10-CM | POA: Diagnosis not present

## 2022-10-08 DIAGNOSIS — I10 Essential (primary) hypertension: Secondary | ICD-10-CM | POA: Diagnosis not present

## 2022-10-08 DIAGNOSIS — Z95828 Presence of other vascular implants and grafts: Secondary | ICD-10-CM

## 2022-10-08 MED ORDER — SODIUM CHLORIDE 0.9% FLUSH
10.0000 mL | INTRAVENOUS | Status: DC | PRN
  Administered 2022-10-08: 10 mL

## 2022-10-08 MED ORDER — HEPARIN SOD (PORK) LOCK FLUSH 100 UNIT/ML IV SOLN
500.0000 [IU] | Freq: Once | INTRAVENOUS | Status: AC | PRN
  Administered 2022-10-08: 500 [IU]

## 2022-10-08 NOTE — Progress Notes (Signed)
[place] OFFICE PROGRESS NOTE   Diagnosis: Pancreas cancer  INTERVAL HISTORY:   Gary [name] returns as scheduled.  He feels well.  He has a good appetite.  Weight stable.  No abdominal pain.  Occasional constipation.  He reports having a bone spur at the right ankle.  He had a recent cortisone injection to the right knee.  He has periodic hip pain.  Objective:  Vital signs in last 24 hours:  Blood pressure (!) 149/65, pulse 64, temperature 98.2 F (36.8 C), temperature source Oral, resp. rate 18, height '5\' 5"'$  (1.651 m), weight 157 lb 3.2 oz (71.3 kg), SpO2 100 %.    HEENT: No thrush or ulcers. Lymphatics: No palpable cervical, supraclavicular or axillary lymph nodes. Resp: Lungs clear bilaterally. Cardio: Regular rate and rhythm. GI: Abdomen soft and nontender.  No masses.  No hepatosplenomegaly.  No apparent ascites. Vascular: No leg edema.  Port-A-Cath without erythema.  Lab Results:  Lab Results  Component Value Date   WBC 9.0 10/19/2021   HGB 12.0 (L) 10/19/2021   HCT 38.0 (L) 10/19/2021   MCV 84.1 10/19/2021   PLT 177 10/19/2021   NEUTROABS 6.2 08/02/2021    Imaging:  No results found.  Medications: I have reviewed the patient's current medications.  Assessment/Plan: Pancreas tail lesion, multiple liver lesions Chest CT 04/12/2020-ascending thoracic aortic aneurysm; hypovascular lesion tail of the pancreas, new compared to the prior examination.  Multiple poorly defined hypovascular lesions scattered throughout the liver CT abdomen/pelvis 05/02/2020-hypoenhancing lesion of the pancreatic tail measuring approximately 2.4 x 1.9 cm; enlarged portacaval lymph nodes measuring up to 1.9 x 1.4 cm; numerous hypodense lesions throughout the liver, index lesion of the liver dome measuring 1.7 x 1.2 cm. Ultrasound-guided biopsy of a left liver lesion 05/10/2020-poorly differentiated carcinoma consistent with a pancreatobiliary primary Markedly elevated CA  19-9  Cycle 1 day 1 gemcitabine/Abraxane 05/26/2020 Cycle 1 day 15 gemcitabine/Abraxane 06/12/2020 Cycle 2 day 1 gemcitabine/Abraxane 07/04/2020 (chemotherapy doses reduced, antiemetic regimen adjusted) Cycle 2 day 15 Gemcitabine/Abraxane 07/18/2020 (continue same dose reduction, reduce post-treatment steroids due to insomnia) Cycle 3 day 1 gem/abraxane 08/02/20 Cycle 3 day 15 gem/Abraxane 08/15/2020 CTs abdomen/pelvis 08/25/2020-pancreas tail mass and portacaval lymphadenopathy decreased.  Liver metastases reported as increased, further review with no significant change in the majority of liver lesions and a decrease in the size of a few lesions, no new margin lesions Cycle 4-day 1 gemcitabine/Abraxane 09/05/2020 Paracentesis 09/26/2020-reactive mesothelial cells Paracentesis 10/09/2020-reactive mesothelial cells Cycle 5 gemcitabine/Abraxane 10/12/2020 Cycle 6 gemcitabine/Abraxane 11/01/2020 Cycle 7 gemcitabine/Abraxane 11/22/2020 Cycle 8 gemcitabine/Abraxane 12/13/2020 Cycle 9 gemcitabine/Abraxane 01/03/2021 CT abdomen/pelvis 01/29/2021-decrease in pancreas tail mass, decrease in size of multiple hepatic metastases, decreased size of hepatoduodenal ligament lymph nodes, progressive ascites Cycle 10 gemcitabine/Abraxane 01/31/2021 CT abdomen/pelvis 04/13/2021- generally stable liver lesions, some have slightly decreased and others slightly increased compared to the CT 01/29/2021, pancreas mass not appreciated, large volume ascites CT abdomen/pelvis 08/02/2021-multiple liver lesions-some are unchanged, fluid density lesions are present-some larger-treated metastases?,  Increased small right pleural effusion, ascites, tail of pancreas lesion Not measurable CT abdomen/pelvis 08/31/2021-increased size of right inguinal hernia containing ascites, unchanged ascites and small right pleural effusion, stable liver lesions, no pancreas mass CT abdomen/pelvis 01/04/2022-unchanged hypoenhancing liver lesions; moderate  volume ascites; primary pancreatic malignancy not clearly visualized; coarse nodular contour of liver; large hiatal hernia     Thoracic aortic aneurysm Hypertension Hyperlipidemia History of bradycardia status post permanent pacemaker Mother had cervical cancer Pitting lower leg edema GERD, on omeprazole  Right upper back inflamed sebaceous cyst-culture from cyst drainage 10/23/2020-2 staph species, antibiotic changed to ciprofloxacin 10/27/2020, status post I&D procedure 11/17/2020 Refractory ascites-trial of spironolactone 03/23/2021; spironolactone and Lasix 04/04/2021--improved 06/21/2021      Disposition: Gary [name] appears stable.  There is no clinical evidence for progression of the pancreas cancer.  We will follow-up on the CA 19-9 from today.  He will return for a port flush, CA 19-9 and office visit in approximately 8 weeks.    Ned Card ANP/GNP-BC   10/08/2022  1:35 PM

## 2022-10-10 LAB — CANCER ANTIGEN 19-9: CA 19-9: 942 U/mL — ABNORMAL HIGH (ref 0–35)

## 2022-10-15 DIAGNOSIS — M79604 Pain in right leg: Secondary | ICD-10-CM | POA: Diagnosis not present

## 2022-10-19 DIAGNOSIS — Z9181 History of falling: Secondary | ICD-10-CM | POA: Diagnosis not present

## 2022-10-19 DIAGNOSIS — M25571 Pain in right ankle and joints of right foot: Secondary | ICD-10-CM | POA: Diagnosis not present

## 2022-10-19 DIAGNOSIS — M25461 Effusion, right knee: Secondary | ICD-10-CM | POA: Diagnosis not present

## 2022-10-19 DIAGNOSIS — R270 Ataxia, unspecified: Secondary | ICD-10-CM | POA: Diagnosis not present

## 2022-10-19 DIAGNOSIS — E785 Hyperlipidemia, unspecified: Secondary | ICD-10-CM | POA: Diagnosis not present

## 2022-10-19 DIAGNOSIS — K219 Gastro-esophageal reflux disease without esophagitis: Secondary | ICD-10-CM | POA: Diagnosis not present

## 2022-10-19 DIAGNOSIS — M1711 Unilateral primary osteoarthritis, right knee: Secondary | ICD-10-CM | POA: Diagnosis not present

## 2022-10-19 DIAGNOSIS — H9190 Unspecified hearing loss, unspecified ear: Secondary | ICD-10-CM | POA: Diagnosis not present

## 2022-10-19 DIAGNOSIS — I10 Essential (primary) hypertension: Secondary | ICD-10-CM | POA: Diagnosis not present

## 2022-10-23 DIAGNOSIS — I10 Essential (primary) hypertension: Secondary | ICD-10-CM | POA: Diagnosis not present

## 2022-10-23 DIAGNOSIS — M25571 Pain in right ankle and joints of right foot: Secondary | ICD-10-CM | POA: Diagnosis not present

## 2022-10-23 DIAGNOSIS — E785 Hyperlipidemia, unspecified: Secondary | ICD-10-CM | POA: Diagnosis not present

## 2022-10-23 DIAGNOSIS — R270 Ataxia, unspecified: Secondary | ICD-10-CM | POA: Diagnosis not present

## 2022-10-23 DIAGNOSIS — M25461 Effusion, right knee: Secondary | ICD-10-CM | POA: Diagnosis not present

## 2022-10-23 DIAGNOSIS — M1711 Unilateral primary osteoarthritis, right knee: Secondary | ICD-10-CM | POA: Diagnosis not present

## 2022-10-25 ENCOUNTER — Ambulatory Visit: Payer: Medicare Other | Attending: Cardiovascular Disease | Admitting: Cardiovascular Disease

## 2022-10-25 ENCOUNTER — Encounter: Payer: Self-pay | Admitting: Cardiovascular Disease

## 2022-10-25 VITALS — BP 138/72 | HR 68 | Ht 65.0 in | Wt 155.5 lb

## 2022-10-25 DIAGNOSIS — I700 Atherosclerosis of aorta: Secondary | ICD-10-CM | POA: Insufficient documentation

## 2022-10-25 DIAGNOSIS — Z950 Presence of cardiac pacemaker: Secondary | ICD-10-CM | POA: Diagnosis not present

## 2022-10-25 DIAGNOSIS — R600 Localized edema: Secondary | ICD-10-CM | POA: Insufficient documentation

## 2022-10-25 DIAGNOSIS — I7121 Aneurysm of the ascending aorta, without rupture: Secondary | ICD-10-CM | POA: Insufficient documentation

## 2022-10-25 DIAGNOSIS — C259 Malignant neoplasm of pancreas, unspecified: Secondary | ICD-10-CM | POA: Diagnosis not present

## 2022-10-25 DIAGNOSIS — C787 Secondary malignant neoplasm of liver and intrahepatic bile duct: Secondary | ICD-10-CM | POA: Diagnosis not present

## 2022-10-25 DIAGNOSIS — Z87898 Personal history of other specified conditions: Secondary | ICD-10-CM | POA: Diagnosis not present

## 2022-10-25 DIAGNOSIS — I10 Essential (primary) hypertension: Secondary | ICD-10-CM | POA: Diagnosis not present

## 2022-10-25 MED ORDER — FUROSEMIDE 20 MG PO TABS: ORAL_TABLET | ORAL | 3 refills | Status: DC

## 2022-10-25 NOTE — Progress Notes (Signed)
Cardiology Office Note    Date:  10/26/2022   ID:  Gary [name], DOB 07-Feb-1932, MRN [id number]  PCP:  [name], MD  Cardiologist:   Sanda Klein, MD   Chief complaint: edema, aortic aneurysm  History of Present Illness:  Gary [name] is a 88 y.o. male with a moderate-sized ascending aortic aneurysm, coronary calcifications without symptomatic CAD, hypertension and hyperlipidemia.    He has a history of dual-chamber permanent pacemaker implanted in 1987 for episodes of asystole that were likely due to medication side effects.  For many decades he did not require either atrial or ventricular pacing.  The leads from [place] had deteriorating parameters (high pacing thresholds, low impedance, sensing artifact consistent with insulation breach) which made them unusable for appropriate pacing.  The device has been programmed off for over 3 years now and he has not experienced bradycardia or syncope.  Despite the fact that he has widely metastatic pancreatic cancer that involves both lobes of the liver and the peritoneum and portacaval lymph nodes he is holding unremarkably well.  For a period of time he was getting repeated paracenteses for ascites (about a dozen times) but on a combination of furosemide and spironolactone he has not required those in about a years time.  He responded well to treatment with gemcitabine and Abraxane that he completed the 10th cycle in February 2022.  There is no evidence of current progression of disease he has been followed with serial CT scans and CA 19-9 assays at the cancer center.  He has a large ascending aortic aneurysm of about 4.8 cm in diameter.  Since he is not a candidate for preventive surgery we have decided not to do routine imaging follow-up, but this has been repeatedly evaluated incidentally during his cancer work-up.  Most recently a CT in November 2022 shows a stable size of the aneurysm at 4.8 cm.  As far back as 2009 his aneurysm  measured 4.7 cm.   Past Medical History:  Diagnosis Date   Ascending aortic aneurysm ([facility]) 01/13/2014   stable at 4.7 cm diameter since 2009   Asystole x2, s/p pacemaker 01/13/2014   Bradycardia    permanent pacemaker 11/22/08 [employer]   Family history of adverse reaction to anesthesia    brother died after anesthesia / nose polyp removal at [facility] (40 years ago)   HTN (hypertension) 01/13/2014   Hyperlipidemia    Hypertension    Malignant ascites    Pacemaker - leads 1987, last generator 2009 [employer] dual-chamber 01/13/2014   Atrial lead model 433-01, ventricular lead model 431-01 implanted November 1987 Right ventricular lead dysfunction with low impedance and mediocre sensing but satisfactory pacing threshold   Pacemaker lead malfunction 01/13/2014   Chronic low impedance and poor sensing of the ventricular lead   Pancreatic cancer ([place])    Metastatic to liver    Past Surgical History:  Procedure Laterality Date   IR IMAGING GUIDED PORT INSERTION  05/24/2020   IR PARACENTESIS  10/26/2020   IR PARACENTESIS  12/12/2020   IR PARACENTESIS  01/22/2021   IR PARACENTESIS  02/02/2021   IR PARACENTESIS  02/14/2021   IR PARACENTESIS  02/23/2021   IR PARACENTESIS  03/06/2021   IR PARACENTESIS  03/14/2021   IR PARACENTESIS  03/22/2021   IR PARACENTESIS  03/29/2021   IR PARACENTESIS  04/06/2021   IR PARACENTESIS  04/17/2021   IR PARACENTESIS  04/27/2021   IR PARACENTESIS  05/09/2021   IR PARACENTESIS  05/22/2021  IR PARACENTESIS  09/07/2021   PERMANENT PACEMAKER GENERATOR CHANGE  11/22/08   [employer]   PERMANENT PACEMAKER INSERTION  03/04/97   [employer]   US ECHOCARDIOGRAPHY  05/16/11   AS mild to mod.,TR mild to mod., EF => 55%.    Current Medications: Outpatient Medications Prior to Visit  Medication Sig Dispense Refill   acetaminophen (TYLENOL) 325 MG tablet 1 tablet as needed     Ciclopirox 1 % shampoo Apply topically as needed.     lidocaine-prilocaine (EMLA) cream Apply 1 application topically as  directed. Apply 1 hour prior to IV stick and cover with plastic wrap 30 g 2   omeprazole (PRILOSEC) 20 MG capsule Take 1 capsule by mouth daily.     spironolactone (ALDACTONE) 25 MG tablet TAKE 1 TABLET(25 MG) BY MOUTH EVERY OTHER DAY 45 tablet 1   furosemide (LASIX) 20 MG tablet ALTERNATE BY TAKING 1 OR 1.5 TABLETS EVERY OTHER DAY('20MG'$  ONE DAY, THEN '30MG'$  THE NEXT DAY, THEN REPEAT) 45 tablet 3   influenza vaccine adjuvanted (FLUAD) 0.5 ML injection Inject into the muscle. (Patient not taking: Reported on 10/25/2022) 0.5 mL 0   No facility-administered medications prior to visit.     Allergies:   Amlodipine, Gentamycin [gentamicin sulfate], Hydrocodone, and Aspirin   Social History   Socioeconomic History   Marital status: Married    Spouse name: Not on file   Number of children: 4   Years of education: Not on file   Highest education level: Not on file  Occupational History   Occupation: retired  Tobacco Use   Smoking status: Never   Smokeless tobacco: Never  Vaping Use   Vaping Use: Never used  Substance and Sexual Activity   Alcohol use: Yes    Comment: occas.   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   ROS:   Please see the history of present illness.    ROS all other systems were reviewed and are negative  PHYSICAL EXAM:   VS:  BP 138/72 (BP Location: Left Arm, Patient Position: Sitting, Cuff Size: Normal)   Pulse 68    Ht '5\' 5"'$  (1.651 m)   Wt 70.5 kg   SpO2 97%   BMI 25.88 kg/m      General: Alert, oriented x3, no distress, healthy chemotherapy Port-A-Cath in the left subclavian area and permanent pacemaker in the right subclavian area Head: no evidence of trauma, PERRL, EOMI, no exophtalmos or lid lag, no myxedema, no xanthelasma; normal ears, nose and oropharynx Neck: normal jugular venous pulsations and no hepatojugular reflux; brisk carotid pulses without delay and no carotid bruits Chest: clear to auscultation, no signs of consolidation by percussion or palpation, normal fremitus, symmetrical and full respiratory excursions Cardiovascular: normal position and quality of the apical impulse, regular rhythm, normal first and second heart sounds, no murmurs, rubs or gallops  Abdomen: no tenderness or distention, no masses by palpation, no abnormal pulsatility or arterial bruits, normal bowel sounds, no hepatosplenomegaly Extremities: no clubbing, cyanosis or edema; 2+ radial, ulnar and brachial pulses bilaterally; 2+ right femoral, posterior tibial and dorsalis pedis pulses; 2+ left femoral, posterior tibial and dorsalis pedis pulses; no subclavian or femoral bruits Neurological: grossly nonfocal Psych: Normal mood and affect    Wt Readings from Last 3 Encounters:  10/25/22 70.5 kg  10/08/22 71.3 kg  08/13/22 71.9 kg      Studies/Labs Reviewed:   EKG:  EKG is ordered today.  It shows normal sinus rhythm and is a completely normal tracing.   Recent Labs: BMET    Component Value Date/Time   NA 138 08/13/2022 1349   NA 142 03/23/2020 1428   K 4.2 08/13/2022 1349   CL 106 08/13/2022 1349   CO2 23 08/13/2022 1349   GLUCOSE 88 08/13/2022 1349   BUN 27 (H) 08/13/2022 1349   BUN 18 03/23/2020 1428   CREATININE 1.34 (H) 08/13/2022 1349   CREATININE 0.99 07/10/2016 1043   CALCIUM 9.3 08/13/2022 1349   GFRNONAA 51 (L) 08/13/2022 1349   GFRAA >60 09/05/2020 1222     Lipid Panel    Component  Value Date/Time   CHOL 140 02/09/2015 1157   TRIG 83 02/09/2015 1157   HDL 49 02/09/2015 1157   CHOLHDL 2.9 02/09/2015 1157   VLDL 17 02/09/2015 1157   LDLCALC 74 02/09/2015 1157   09/03/2019 labs from Dr. Brigitte Pulse HDL 47, LDL 71, total cholesterol 139, TG 103 10/11/2020 Creatinine 0.8, potassium 4.1 10/23/2020 Hemoglobin 10.8  ASSESSMENT:    1. Aneurysm of ascending aorta without rupture ([place])   2. Aortic atherosclerosis ([facility])   3. Edema, lower extremity   4. Essential hypertension   5. History of pacemaker   6. History of syncope   7. Pancreatic cancer metastasized to liver [facility])      PLAN:  In order of problems listed above: Ascending aortic aneurysm: Completely unchanged since 2009.  Due to his age, frailty and comorbid conditions he would not be a candidate for preventative repair of the ascending aorta.  Serial studies specifically designed for monitoring of his aneurysm are therefore not indicated, but we have been getting information regarding his size on his CTs performed for cancer monitoring. Aortic atherosclerosis: Seen repeatedly on imaging studies but not associated with clinically relevant CAD or PAD Ascites/edema: Has responded well to treatment with diuretics.  He is currently taking furosemide 30 mg every other day (he is not taking the reported dose of 20 mg on the other days).  I think we can try to reduce the dose of furosemide further to help prevent worsening kidney parameters.  Asked him to start taking furosemide 20 mg every other day, continue daily weights, increase the furosemide dose back to 30 mg only if his weight exceeds 160 pounds. HTN: Well-controlled.  Currently his only antihypertensive medication is spironolactone and the intermittent low-dose of furosemide Pacemaker: The device has been turned off for about 3 years.  His lead parameters had deteriorated and the device was no longer useful.  It does not appear that he needs any pacemaker. Metastatic  pancreatic cancer: He has had a remarkably positive and durable response to chemotherapy.  He does not appear to be suffering from any pain.  He has reasonably good functional status.    Medication Adjustments/Labs and Tests Ordered: Current medicines are reviewed at length with the patient today.  Concerns regarding medicines are outlined above.  Medication changes, Labs and Tests ordered today are listed in the Patient Instructions below. Patient Instructions  Medication Instructions:  TAKE Lasix 20 mg every other day.  *If you need a refill on your cardiac medications before your next appointment, please call your pharmacy*  Lab Work: NONE ordered at this time of appointment   If you have labs (blood work) drawn today and your tests are completely normal, you will receive your results only by: [place] (if you have MyChart) OR A paper copy in the mail If you have any lab test that is abnormal or we need to change your treatment, we will call you to review the results.  Testing/Procedures: NONE ordered at this time of appointment   Follow-Up: At [facility], you and your health needs are our priority.  As part of our continuing mission to provide you with exceptional heart care, we have created designated Provider Care Teams.  These Care Teams include your primary Cardiologist (physician) and Advanced Practice Providers (APPs -  Physician Assistants and Nurse Practitioners) who all work together to provide you with the care you need, when you need it.  Your next appointment:   1 year(s)  The format for your next appointment:   In Person  Provider:   Sanda Klein, MD     Other Instructions Weigh yourself daily, If your weight is 160 lbs or greater take Lasix 30 mg (1.5 tablets) every other day and give our office a call   Important Information About Sugar         Signed, Sanda Klein, MD  10/26/2022 7:04 PM    [address], [place], [place]  [address] Phone: [phone]; Fax: [phone]

## 2022-10-25 NOTE — Patient Instructions (Addendum)
Medication Instructions:  TAKE Lasix 20 mg every other day.  *If you need a refill on your cardiac medications before your next appointment, please call your pharmacy*  Lab Work: NONE ordered at this time of appointment   If you have labs (blood work) drawn today and your tests are completely normal, you will receive your results only by: [place] (if you have MyChart) OR A paper copy in the mail If you have any lab test that is abnormal or we need to change your treatment, we will call you to review the results.  Testing/Procedures: NONE ordered at this time of appointment   Follow-Up: At [facility], you and your health needs are our priority.  As part of our continuing mission to provide you with exceptional heart care, we have created designated Provider Care Teams.  These Care Teams include your primary Cardiologist (physician) and Advanced Practice Providers (APPs -  Physician Assistants and Nurse Practitioners) who all work together to provide you with the care you need, when you need it.  Your next appointment:   1 year(s)  The format for your next appointment:   In Person  Provider:   Sanda Klein, MD     Other Instructions Weigh yourself daily, If your weight is 160 lbs or greater take Lasix 30 mg (1.5 tablets) every other day and give our office a call   Important Information About Sugar

## 2022-10-29 DIAGNOSIS — E785 Hyperlipidemia, unspecified: Secondary | ICD-10-CM | POA: Diagnosis not present

## 2022-10-29 DIAGNOSIS — M1711 Unilateral primary osteoarthritis, right knee: Secondary | ICD-10-CM | POA: Diagnosis not present

## 2022-10-29 DIAGNOSIS — M25571 Pain in right ankle and joints of right foot: Secondary | ICD-10-CM | POA: Diagnosis not present

## 2022-10-29 DIAGNOSIS — R270 Ataxia, unspecified: Secondary | ICD-10-CM | POA: Diagnosis not present

## 2022-10-29 DIAGNOSIS — M25461 Effusion, right knee: Secondary | ICD-10-CM | POA: Diagnosis not present

## 2022-10-29 DIAGNOSIS — I10 Essential (primary) hypertension: Secondary | ICD-10-CM | POA: Diagnosis not present

## 2022-11-04 DIAGNOSIS — R270 Ataxia, unspecified: Secondary | ICD-10-CM | POA: Diagnosis not present

## 2022-11-04 DIAGNOSIS — M25571 Pain in right ankle and joints of right foot: Secondary | ICD-10-CM | POA: Diagnosis not present

## 2022-11-04 DIAGNOSIS — E785 Hyperlipidemia, unspecified: Secondary | ICD-10-CM | POA: Diagnosis not present

## 2022-11-04 DIAGNOSIS — I10 Essential (primary) hypertension: Secondary | ICD-10-CM | POA: Diagnosis not present

## 2022-11-04 DIAGNOSIS — M25461 Effusion, right knee: Secondary | ICD-10-CM | POA: Diagnosis not present

## 2022-11-04 DIAGNOSIS — M1711 Unilateral primary osteoarthritis, right knee: Secondary | ICD-10-CM | POA: Diagnosis not present

## 2022-11-06 DIAGNOSIS — E785 Hyperlipidemia, unspecified: Secondary | ICD-10-CM | POA: Diagnosis not present

## 2022-11-06 DIAGNOSIS — I10 Essential (primary) hypertension: Secondary | ICD-10-CM | POA: Diagnosis not present

## 2022-11-06 DIAGNOSIS — M1711 Unilateral primary osteoarthritis, right knee: Secondary | ICD-10-CM | POA: Diagnosis not present

## 2022-11-06 DIAGNOSIS — M25461 Effusion, right knee: Secondary | ICD-10-CM | POA: Diagnosis not present

## 2022-11-06 DIAGNOSIS — M25571 Pain in right ankle and joints of right foot: Secondary | ICD-10-CM | POA: Diagnosis not present

## 2022-11-06 DIAGNOSIS — R270 Ataxia, unspecified: Secondary | ICD-10-CM | POA: Diagnosis not present

## 2022-11-13 DIAGNOSIS — M1711 Unilateral primary osteoarthritis, right knee: Secondary | ICD-10-CM | POA: Diagnosis not present

## 2022-11-13 DIAGNOSIS — M25461 Effusion, right knee: Secondary | ICD-10-CM | POA: Diagnosis not present

## 2022-11-13 DIAGNOSIS — E785 Hyperlipidemia, unspecified: Secondary | ICD-10-CM | POA: Diagnosis not present

## 2022-11-13 DIAGNOSIS — I10 Essential (primary) hypertension: Secondary | ICD-10-CM | POA: Diagnosis not present

## 2022-11-13 DIAGNOSIS — M25571 Pain in right ankle and joints of right foot: Secondary | ICD-10-CM | POA: Diagnosis not present

## 2022-11-13 DIAGNOSIS — R270 Ataxia, unspecified: Secondary | ICD-10-CM | POA: Diagnosis not present

## 2022-11-18 DIAGNOSIS — I10 Essential (primary) hypertension: Secondary | ICD-10-CM | POA: Diagnosis not present

## 2022-11-18 DIAGNOSIS — E785 Hyperlipidemia, unspecified: Secondary | ICD-10-CM | POA: Diagnosis not present

## 2022-11-18 DIAGNOSIS — H9190 Unspecified hearing loss, unspecified ear: Secondary | ICD-10-CM | POA: Diagnosis not present

## 2022-11-18 DIAGNOSIS — K219 Gastro-esophageal reflux disease without esophagitis: Secondary | ICD-10-CM | POA: Diagnosis not present

## 2022-11-18 DIAGNOSIS — M25461 Effusion, right knee: Secondary | ICD-10-CM | POA: Diagnosis not present

## 2022-11-18 DIAGNOSIS — M25571 Pain in right ankle and joints of right foot: Secondary | ICD-10-CM | POA: Diagnosis not present

## 2022-11-18 DIAGNOSIS — Z9181 History of falling: Secondary | ICD-10-CM | POA: Diagnosis not present

## 2022-11-18 DIAGNOSIS — M1711 Unilateral primary osteoarthritis, right knee: Secondary | ICD-10-CM | POA: Diagnosis not present

## 2022-11-18 DIAGNOSIS — R270 Ataxia, unspecified: Secondary | ICD-10-CM | POA: Diagnosis not present

## 2022-11-21 DIAGNOSIS — I10 Essential (primary) hypertension: Secondary | ICD-10-CM | POA: Diagnosis not present

## 2022-11-21 DIAGNOSIS — M25461 Effusion, right knee: Secondary | ICD-10-CM | POA: Diagnosis not present

## 2022-11-21 DIAGNOSIS — M25571 Pain in right ankle and joints of right foot: Secondary | ICD-10-CM | POA: Diagnosis not present

## 2022-11-21 DIAGNOSIS — E785 Hyperlipidemia, unspecified: Secondary | ICD-10-CM | POA: Diagnosis not present

## 2022-11-21 DIAGNOSIS — R270 Ataxia, unspecified: Secondary | ICD-10-CM | POA: Diagnosis not present

## 2022-11-21 DIAGNOSIS — M1711 Unilateral primary osteoarthritis, right knee: Secondary | ICD-10-CM | POA: Diagnosis not present

## 2022-11-28 DIAGNOSIS — M1711 Unilateral primary osteoarthritis, right knee: Secondary | ICD-10-CM | POA: Diagnosis not present

## 2022-11-28 DIAGNOSIS — M25561 Pain in right knee: Secondary | ICD-10-CM | POA: Diagnosis not present

## 2022-12-05 ENCOUNTER — Inpatient Hospital Stay: Payer: Medicare Other

## 2022-12-05 ENCOUNTER — Inpatient Hospital Stay: Payer: Medicare Other | Attending: Oncology

## 2022-12-05 ENCOUNTER — Inpatient Hospital Stay (HOSPITAL_BASED_OUTPATIENT_CLINIC_OR_DEPARTMENT_OTHER): Payer: Medicare Other | Admitting: Oncology

## 2022-12-05 VITALS — BP 144/62 | HR 62 | Temp 98.2°F | Resp 18 | Ht 65.0 in | Wt 157.0 lb

## 2022-12-05 DIAGNOSIS — I7121 Aneurysm of the ascending aorta, without rupture: Secondary | ICD-10-CM | POA: Insufficient documentation

## 2022-12-05 DIAGNOSIS — K219 Gastro-esophageal reflux disease without esophagitis: Secondary | ICD-10-CM | POA: Insufficient documentation

## 2022-12-05 DIAGNOSIS — Z95828 Presence of other vascular implants and grafts: Secondary | ICD-10-CM

## 2022-12-05 DIAGNOSIS — C252 Malignant neoplasm of tail of pancreas: Secondary | ICD-10-CM

## 2022-12-05 DIAGNOSIS — E785 Hyperlipidemia, unspecified: Secondary | ICD-10-CM | POA: Insufficient documentation

## 2022-12-05 DIAGNOSIS — Z08 Encounter for follow-up examination after completed treatment for malignant neoplasm: Secondary | ICD-10-CM | POA: Diagnosis not present

## 2022-12-05 DIAGNOSIS — Z8507 Personal history of malignant neoplasm of pancreas: Secondary | ICD-10-CM | POA: Insufficient documentation

## 2022-12-05 DIAGNOSIS — I10 Essential (primary) hypertension: Secondary | ICD-10-CM | POA: Insufficient documentation

## 2022-12-05 LAB — BASIC METABOLIC PANEL - CANCER CENTER ONLY
Anion gap: 10 (ref 5–15)
BUN: 28 mg/dL — ABNORMAL HIGH (ref 8–23)
CO2: 23 mmol/L (ref 22–32)
Calcium: 9 mg/dL (ref 8.9–10.3)
Chloride: 106 mmol/L (ref 98–111)
Creatinine: 1.24 mg/dL (ref 0.61–1.24)
GFR, Estimated: 56 mL/min — ABNORMAL LOW (ref >60)
Glucose, Bld: 81 mg/dL (ref 70–99)
Potassium: 4.2 mmol/L (ref 3.5–5.1)
Sodium: 139 mmol/L (ref 135–145)

## 2022-12-05 MED ORDER — SODIUM CHLORIDE 0.9% FLUSH
10.0000 mL | INTRAVENOUS | Status: DC | PRN
  Administered 2022-12-05: 10 mL

## 2022-12-05 MED ORDER — HEPARIN SOD (PORK) LOCK FLUSH 100 UNIT/ML IV SOLN
500.0000 [IU] | Freq: Once | INTRAVENOUS | Status: AC | PRN
  Administered 2022-12-05: 500 [IU]

## 2022-12-05 NOTE — Progress Notes (Signed)
[place] OFFICE PROGRESS NOTE   Diagnosis: Pancreas cancer  INTERVAL HISTORY:   Gary [name] returns as scheduled.  He is here with his wife.  Reports recent increase in "arthritis "pain.  He received an injection at the right knee.  The right inguinal hernia is not painful.  He has intermittent abdominal discomfort.  Objective:  Vital signs in last 24 hours:  Blood pressure (!) 144/62, pulse 62, temperature 98.2 F (36.8 C), temperature source Oral, resp. rate 18, height '5\' 5"'$  (1.651 m), weight 157 lb (71.2 kg), SpO2 100 %.   Lymphatics: No cervical, supraclavicular, axillary, or inguinal nodes Resp: Lungs clear bilaterally Cardio: Regular rate and rhythm GI: No mass, nontender, soft right inguinal hernia, no hepatosplenomegaly, no mass, no apparent ascites Vascular: No leg edema   Portacath/PICC-without erythema  Lab Results:  Lab Results  Component Value Date   WBC 9.0 10/19/2021   HGB 12.0 (L) 10/19/2021   HCT 38.0 (L) 10/19/2021   MCV 84.1 10/19/2021   PLT 177 10/19/2021   NEUTROABS 6.2 08/02/2021    CMP  Lab Results  Component Value Date   NA 139 12/05/2022   K 4.2 12/05/2022   CL 106 12/05/2022   CO2 23 12/05/2022   GLUCOSE 81 12/05/2022   BUN 28 (H) 12/05/2022   CREATININE 1.24 12/05/2022   CALCIUM 9.0 12/05/2022   PROT 7.6 03/12/2022   ALBUMIN 3.8 03/12/2022   AST 25 03/12/2022   ALT 27 03/12/2022   ALKPHOS 105 03/12/2022   BILITOT 0.6 03/12/2022   GFRNONAA 56 (L) 12/05/2022   GFRAA >60 09/05/2020    Lab Results  Component Value Date   [id number] 942 (H) 10/08/2022     Medications: I have reviewed the patient's current medications.   Assessment/Plan: Pancreas tail lesion, multiple liver lesions Chest CT 04/12/2020-ascending thoracic aortic aneurysm; hypovascular lesion tail of the pancreas, new compared to the prior examination.  Multiple poorly defined hypovascular lesions scattered throughout the liver CT abdomen/pelvis  05/02/2020-hypoenhancing lesion of the pancreatic tail measuring approximately 2.4 x 1.9 cm; enlarged portacaval lymph nodes measuring up to 1.9 x 1.4 cm; numerous hypodense lesions throughout the liver, index lesion of the liver dome measuring 1.7 x 1.2 cm. Ultrasound-guided biopsy of a left liver lesion 05/10/2020-poorly differentiated carcinoma consistent with a pancreatobiliary primary Markedly elevated CA 19-9  Cycle 1 day 1 gemcitabine/Abraxane 05/26/2020 Cycle 1 day 15 gemcitabine/Abraxane 06/12/2020 Cycle 2 day 1 gemcitabine/Abraxane 07/04/2020 (chemotherapy doses reduced, antiemetic regimen adjusted) Cycle 2 day 15 Gemcitabine/Abraxane 07/18/2020 (continue same dose reduction, reduce post-treatment steroids due to insomnia) Cycle 3 day 1 gem/abraxane 08/02/20 Cycle 3 day 15 gem/Abraxane 08/15/2020 CTs abdomen/pelvis 08/25/2020-pancreas tail mass and portacaval lymphadenopathy decreased.  Liver metastases reported as increased, further review with no significant change in the majority of liver lesions and a decrease in the size of a few lesions, no new margin lesions Cycle 4-day 1 gemcitabine/Abraxane 09/05/2020 Paracentesis 09/26/2020-reactive mesothelial cells Paracentesis 10/09/2020-reactive mesothelial cells Cycle 5 gemcitabine/Abraxane 10/12/2020 Cycle 6 gemcitabine/Abraxane 11/01/2020 Cycle 7 gemcitabine/Abraxane 11/22/2020 Cycle 8 gemcitabine/Abraxane 12/13/2020 Cycle 9 gemcitabine/Abraxane 01/03/2021 CT abdomen/pelvis 01/29/2021-decrease in pancreas tail mass, decrease in size of multiple hepatic metastases, decreased size of hepatoduodenal ligament lymph nodes, progressive ascites Cycle 10 gemcitabine/Abraxane 01/31/2021 CT abdomen/pelvis 04/13/2021- generally stable liver lesions, some have slightly decreased and others slightly increased compared to the CT 01/29/2021, pancreas mass not appreciated, large volume ascites CT abdomen/pelvis 08/02/2021-multiple liver lesions-some are unchanged,  fluid density lesions are present-some larger-treated metastases?,  Increased  small right pleural effusion, ascites, tail of pancreas lesion Not measurable CT abdomen/pelvis 08/31/2021-increased size of right inguinal hernia containing ascites, unchanged ascites and small right pleural effusion, stable liver lesions, no pancreas mass CT abdomen/pelvis 01/04/2022-unchanged hypoenhancing liver lesions; moderate volume ascites; primary pancreatic malignancy not clearly visualized; coarse nodular contour of liver; large hiatal hernia     Thoracic aortic aneurysm Hypertension Hyperlipidemia History of bradycardia status post permanent pacemaker Mother had cervical cancer Pitting lower leg edema GERD, on omeprazole  Right upper back inflamed sebaceous cyst-culture from cyst drainage 10/23/2020-2 staph species, antibiotic changed to ciprofloxacin 10/27/2020, status post I&D procedure 11/17/2020 Refractory ascites-trial of spironolactone 03/23/2021; spironolactone and Lasix 04/04/2021--improved 06/21/2021       Disposition: Gary [name] appears stable.  There is no clinical evidence for progression of the pancreas cancer.  The CA 19-9 was slightly increased in October.  We will follow-up on the CA 19-9 from today.  Ascites has not reaccumulated.  The plan is to continue observation.  He will return for an office visit and Port-A-Cath flush in 8 weeks.  Betsy Coder, MD  12/05/2022  3:54 PM

## 2022-12-07 LAB — CANCER ANTIGEN 19-9: CA 19-9: 719 U/mL — ABNORMAL HIGH (ref 0–35)

## 2022-12-10 ENCOUNTER — Telehealth: Payer: Self-pay | Admitting: Cardiovascular Disease

## 2022-12-10 NOTE — Telephone Encounter (Signed)
Returned call to patients wife (okay per DPR) who states that she thought patient was supposed to stop the furosemide all together when patient saw Dr. Loletha Grayer last. Advised her that per OV note was supposed to continue Furosemide '20mg'$  every other day. Patient's wife states that patient has not been taking the furosemide at all and reports he has only been taking the Spironolactone '25mg'$  every other day. Patients wife would like to check with Dr. Loletha Grayer to confirm if patient should be taking Furosemide '20mg'$  every other day AND Spironolactone '25mg'$  every other day. Advised patients wife I would forward message to Dr. Loletha Grayer for him to review and advise. Patients wife verbalized understanding.

## 2022-12-10 NOTE — Telephone Encounter (Signed)
If he has not had any edema of the legs or swelling of the abdomen, it's perfectly fine to stay off the furosemide completely.  Spironolactone every other day is also OK as log as BP is under 240/90 (this is not just a fluid pill, but also a BP medication).

## 2022-12-10 NOTE — Telephone Encounter (Signed)
Pt c/o medication issue:  1. Name of Medication:   spironolactone (ALDACTONE) 25 MG tablet    furosemide (LASIX) 20 MG tablet    2. How are you currently taking this medication (dosage and times per day)?   3. Are you having a reaction (difficulty breathing--STAT)?   4. What is your medication issue? Pt's wife would like a call back to discuss medication and how pt should be taking. Please advise.

## 2022-12-11 ENCOUNTER — Telehealth: Payer: Self-pay | Admitting: Internal Medicine

## 2022-12-11 MED ORDER — FUROSEMIDE 20 MG PO TABS: 20.0000 mg | ORAL_TABLET | Freq: Every day | ORAL | Status: DC | PRN

## 2022-12-11 MED ORDER — SPIRONOLACTONE 25 MG PO TABS: 25.0000 mg | ORAL_TABLET | ORAL | Status: DC

## 2022-12-11 NOTE — Telephone Encounter (Signed)
Patient's son called, stated that the patient's cardiologist prescribed him some medication and told him to stop taking a medication that Dr. Carlean Purl had prescribed. Patient's son is worried about the changes in medication. Please advise.

## 2022-12-11 NOTE — Telephone Encounter (Signed)
Spoke to [employer] of recommendations and verbalized understanding.   Med list updated.

## 2022-12-11 NOTE — Telephone Encounter (Signed)
Pt son Gary [name] stated that he and his parents are confused with the current medication regiment that has been prescribed for Gary [name]: Chart was reviewed and notes from Cardiology reviewed with Gary [name] was notified to reach back out to [facility]  cardiologist and request explanation  in detail  for  the change in the medications: Lasix and Spironolactone   Gary [name] requested that Gary [name] be made aware and requested a call back from Gary [name]: Gary [name] stated that Gary [name] was on old [place] of Gary [name]:

## 2022-12-11 NOTE — Telephone Encounter (Signed)
Patient's wife calling back. °

## 2022-12-11 NOTE — Telephone Encounter (Signed)
Patient had stopped taking furosemide altogether in November.  He was on 10 mg every other day but I think was confused about this when he saw Dr. Sallyanne Kuster who thought he was on 30 mg every other day.  He is not gaining fluid or weight and blood pressure is under good control per conversation with son, only on spironolactone 25 mg every other day.  GFR 6 days ago at cardiology visit was as good as it has been in a long time.  I have recommended he stay off furosemide completely and monitor fluid status and blood pressure and if there are changes warranting adding furosemide back we could do so.  He has follow-up with me in February also.

## 2022-12-12 DIAGNOSIS — M19071 Primary osteoarthritis, right ankle and foot: Secondary | ICD-10-CM | POA: Diagnosis not present

## 2022-12-12 DIAGNOSIS — I739 Peripheral vascular disease, unspecified: Secondary | ICD-10-CM | POA: Diagnosis not present

## 2022-12-12 DIAGNOSIS — M19072 Primary osteoarthritis, left ankle and foot: Secondary | ICD-10-CM | POA: Diagnosis not present

## 2022-12-12 DIAGNOSIS — L602 Onychogryphosis: Secondary | ICD-10-CM | POA: Diagnosis not present

## 2022-12-12 NOTE — Telephone Encounter (Signed)
Thanks, Glendell Docker.  He had no evidence of hypervolemia when I saw him.  Sorry if I caused confusion.  Hope you have a Happy New Year!

## 2022-12-17 DIAGNOSIS — R270 Ataxia, unspecified: Secondary | ICD-10-CM | POA: Diagnosis not present

## 2022-12-17 DIAGNOSIS — M1711 Unilateral primary osteoarthritis, right knee: Secondary | ICD-10-CM | POA: Diagnosis not present

## 2022-12-17 DIAGNOSIS — E785 Hyperlipidemia, unspecified: Secondary | ICD-10-CM | POA: Diagnosis not present

## 2022-12-17 DIAGNOSIS — I10 Essential (primary) hypertension: Secondary | ICD-10-CM | POA: Diagnosis not present

## 2022-12-17 DIAGNOSIS — M25571 Pain in right ankle and joints of right foot: Secondary | ICD-10-CM | POA: Diagnosis not present

## 2022-12-17 DIAGNOSIS — M25461 Effusion, right knee: Secondary | ICD-10-CM | POA: Diagnosis not present

## 2023-01-27 ENCOUNTER — Encounter: Payer: Self-pay | Admitting: Oncology

## 2023-02-03 ENCOUNTER — Encounter: Payer: Self-pay | Admitting: Oncology

## 2023-02-03 ENCOUNTER — Inpatient Hospital Stay: Payer: Medicare Other

## 2023-02-03 ENCOUNTER — Inpatient Hospital Stay (HOSPITAL_BASED_OUTPATIENT_CLINIC_OR_DEPARTMENT_OTHER): Payer: Medicare Other | Admitting: Oncology

## 2023-02-03 ENCOUNTER — Inpatient Hospital Stay: Payer: Medicare Other | Attending: Oncology

## 2023-02-03 VITALS — BP 137/79 | HR 69 | Temp 98.1°F | Resp 18 | Ht 65.0 in | Wt 155.4 lb

## 2023-02-03 DIAGNOSIS — Z9221 Personal history of antineoplastic chemotherapy: Secondary | ICD-10-CM | POA: Diagnosis not present

## 2023-02-03 DIAGNOSIS — C252 Malignant neoplasm of tail of pancreas: Secondary | ICD-10-CM

## 2023-02-03 DIAGNOSIS — Z950 Presence of cardiac pacemaker: Secondary | ICD-10-CM | POA: Diagnosis not present

## 2023-02-03 DIAGNOSIS — I7121 Aneurysm of the ascending aorta, without rupture: Secondary | ICD-10-CM | POA: Diagnosis not present

## 2023-02-03 DIAGNOSIS — Z95828 Presence of other vascular implants and grafts: Secondary | ICD-10-CM

## 2023-02-03 DIAGNOSIS — K219 Gastro-esophageal reflux disease without esophagitis: Secondary | ICD-10-CM | POA: Diagnosis not present

## 2023-02-03 DIAGNOSIS — C787 Secondary malignant neoplasm of liver and intrahepatic bile duct: Secondary | ICD-10-CM | POA: Diagnosis not present

## 2023-02-03 DIAGNOSIS — E785 Hyperlipidemia, unspecified: Secondary | ICD-10-CM | POA: Diagnosis not present

## 2023-02-03 LAB — BASIC METABOLIC PANEL - CANCER CENTER ONLY
Anion gap: 9 (ref 5–15)
BUN: 23 mg/dL (ref 8–23)
CO2: 24 mmol/L (ref 22–32)
Calcium: 9.5 mg/dL (ref 8.9–10.3)
Chloride: 104 mmol/L (ref 98–111)
Creatinine: 1.27 mg/dL — ABNORMAL HIGH (ref 0.61–1.24)
GFR, Estimated: 54 mL/min — ABNORMAL LOW (ref >60)
Glucose, Bld: 116 mg/dL — ABNORMAL HIGH (ref 70–99)
Potassium: 4.2 mmol/L (ref 3.5–5.1)
Sodium: 137 mmol/L (ref 135–145)

## 2023-02-03 MED ORDER — ALTEPLASE 2 MG IJ SOLR: 2.0000 mg | Freq: Once | INTRAMUSCULAR | Status: DC | PRN

## 2023-02-03 MED ORDER — HEPARIN SOD (PORK) LOCK FLUSH 100 UNIT/ML IV SOLN
500.0000 [IU] | Freq: Once | INTRAVENOUS | Status: AC | PRN
  Administered 2023-02-03: 500 [IU]

## 2023-02-03 MED ORDER — SODIUM CHLORIDE 0.9% FLUSH
10.0000 mL | INTRAVENOUS | Status: DC | PRN
  Administered 2023-02-03: 10 mL

## 2023-02-03 NOTE — Patient Instructions (Signed)

## 2023-02-03 NOTE — Progress Notes (Signed)
[place] OFFICE PROGRESS NOTE   Diagnosis: Pancreas cancer  INTERVAL HISTORY:   Gary Leis returns as scheduled.  He generally feels well.  Good appetite.  No recurrence of ascites.  He has developed hemorrhoid discomfort for the past several days.  He wonders whether this could be related to his bidet.  No bleeding.  Objective:  Vital signs in last 24 hours:  Blood pressure 137/79, pulse 69, temperature 98.1 F (36.7 C), temperature source Oral, resp. rate 18, height 5' 5"$  (1.651 m), weight 155 lb 6.4 oz (70.5 kg), SpO2 99 %.    Lymphatics: No cervical, supraclavicular, axillary, or inguinal nodes Resp: Lungs clear bilaterally Cardio: Regular rate and rhythm GI: No hepatosplenomegaly, no mass, nontender, right inguinal hernia, no apparent ascites Vascular: No leg edema Rectal: Soft small external hemorrhoids  Portacath/PICC-without erythema  Lab Results:  Lab Results  Component Value Date   WBC 9.0 10/19/2021   HGB 12.0 (L) 10/19/2021   HCT 38.0 (L) 10/19/2021   MCV 84.1 10/19/2021   PLT 177 10/19/2021   NEUTROABS 6.2 08/02/2021    CMP  Lab Results  Component Value Date   NA 137 02/03/2023   K 4.2 02/03/2023   CL 104 02/03/2023   CO2 24 02/03/2023   GLUCOSE 116 (H) 02/03/2023   BUN 23 02/03/2023   CREATININE 1.27 (H) 02/03/2023   CALCIUM 9.5 02/03/2023   PROT 7.6 03/12/2022   ALBUMIN 3.8 03/12/2022   AST 25 03/12/2022   ALT 27 03/12/2022   ALKPHOS 105 03/12/2022   BILITOT 0.6 03/12/2022   GFRNONAA 54 (L) 02/03/2023   GFRAA >60 09/05/2020    Lab Results  Component Value Date   [id number] (H) 12/05/2022     Medications: I have reviewed the patient's current medications.   Assessment/Plan: Pancreas tail lesion, multiple liver lesions Chest CT 04/12/2020-ascending thoracic aortic aneurysm; hypovascular lesion tail of the pancreas, new compared to the prior examination.  Multiple poorly defined hypovascular lesions scattered  throughout the liver CT abdomen/pelvis 05/02/2020-hypoenhancing lesion of the pancreatic tail measuring approximately 2.4 x 1.9 cm; enlarged portacaval lymph nodes measuring up to 1.9 x 1.4 cm; numerous hypodense lesions throughout the liver, index lesion of the liver dome measuring 1.7 x 1.2 cm. Ultrasound-guided biopsy of a left liver lesion 05/10/2020-poorly differentiated carcinoma consistent with a pancreatobiliary primary Markedly elevated CA 19-9  Cycle 1 day 1 gemcitabine/Abraxane 05/26/2020 Cycle 1 day 15 gemcitabine/Abraxane 06/12/2020 Cycle 2 day 1 gemcitabine/Abraxane 07/04/2020 (chemotherapy doses reduced, antiemetic regimen adjusted) Cycle 2 day 15 Gemcitabine/Abraxane 07/18/2020 (continue same dose reduction, reduce post-treatment steroids due to insomnia) Cycle 3 day 1 gem/abraxane 08/02/20 Cycle 3 day 15 gem/Abraxane 08/15/2020 CTs abdomen/pelvis 08/25/2020-pancreas tail mass and portacaval lymphadenopathy decreased.  Liver metastases reported as increased, further review with no significant change in the majority of liver lesions and a decrease in the size of a few lesions, no new margin lesions Cycle 4-day 1 gemcitabine/Abraxane 09/05/2020 Paracentesis 09/26/2020-reactive mesothelial cells Paracentesis 10/09/2020-reactive mesothelial cells Cycle 5 gemcitabine/Abraxane 10/12/2020 Cycle 6 gemcitabine/Abraxane 11/01/2020 Cycle 7 gemcitabine/Abraxane 11/22/2020 Cycle 8 gemcitabine/Abraxane 12/13/2020 Cycle 9 gemcitabine/Abraxane 01/03/2021 CT abdomen/pelvis 01/29/2021-decrease in pancreas tail mass, decrease in size of multiple hepatic metastases, decreased size of hepatoduodenal ligament lymph nodes, progressive ascites Cycle 10 gemcitabine/Abraxane 01/31/2021 CT abdomen/pelvis 04/13/2021- generally stable liver lesions, some have slightly decreased and others slightly increased compared to the CT 01/29/2021, pancreas mass not appreciated, large volume ascites CT abdomen/pelvis  08/02/2021-multiple liver lesions-some are unchanged, fluid density lesions are  present-some larger-treated metastases?,  Increased small right pleural effusion, ascites, tail of pancreas lesion Not measurable CT abdomen/pelvis 08/31/2021-increased size of right inguinal hernia containing ascites, unchanged ascites and small right pleural effusion, stable liver lesions, no pancreas mass CT abdomen/pelvis 01/04/2022-unchanged hypoenhancing liver lesions; moderate volume ascites; primary pancreatic malignancy not clearly visualized; coarse nodular contour of liver; large hiatal hernia     Thoracic aortic aneurysm Hypertension Hyperlipidemia History of bradycardia status post permanent pacemaker Mother had cervical cancer Pitting lower leg edema GERD, on omeprazole  Right upper back inflamed sebaceous cyst-culture from cyst drainage 10/23/2020-2 staph species, antibiotic changed to ciprofloxacin 10/27/2020, status post I&D procedure 11/17/2020 Refractory ascites-trial of spironolactone 03/23/2021; spironolactone and Lasix 04/04/2021--improved 06/21/2021       Disposition: Gary [name] peers unchanged.  We will follow-up on the CA 19-9 from today.  The CA 19-9 was lower in December 2023.  A Port-A-Cath remains in place.  He will return for a Port-A-Cath flush in 6 weeks and an office visit in 12 weeks.  If no clinical evidence for progression of the pancreas cancer.  He will try Anusol or Preparation H cream for the hemorrhoids.  Betsy Coder, MD  02/03/2023  3:24 PM

## 2023-02-03 NOTE — Patient Instructions (Signed)
Hemorrhoids--Start Preparation H ointment for hemorrhoids Hemorrhoids are swollen veins in and around the rectum or anus. There are two types of hemorrhoids: Internal hemorrhoids. These occur in the veins that are just inside the rectum. They may poke through to the outside and become irritated and painful. External hemorrhoids. These occur in the veins that are outside the anus and can be felt as a painful swelling or hard lump near the anus. Most hemorrhoids do not cause serious problems, and they can be managed with home treatments such as diet and lifestyle changes. If home treatments do not help the symptoms, procedures can be done to shrink or remove the hemorrhoids. What are the causes? This condition is caused by increased pressure in the anal area. This pressure may result from various things, including: Constipation. Straining to have a bowel movement. Diarrhea. Pregnancy. Obesity. Sitting for long periods of time. Heavy lifting or other activity that causes you to strain. Anal sex. Riding a bike for a long period of time. What are the signs or symptoms? Symptoms of this condition include: Pain. Anal itching or irritation. Rectal bleeding. Leakage of stool (feces). Anal swelling. One or more lumps around the anus. How is this diagnosed? This condition can often be diagnosed through a visual exam. Other exams or tests may also be done, such as: An exam that involves feeling the rectal area with a gloved hand (digital rectal exam). An exam of the anal canal that is done using a small tube (anoscope). A blood test, if you have lost a significant amount of blood. A test to look inside the colon using a flexible tube with a camera on the end (sigmoidoscopy or colonoscopy). How is this treated? This condition can usually be treated at home. However, various procedures may be done if dietary changes, lifestyle changes, and other home treatments do not help your symptoms. These  procedures can help make the hemorrhoids smaller or remove them completely. Some of these procedures involve surgery, and others do not. Common procedures include: Rubber band ligation. Rubber bands are placed at the base of the hemorrhoids to cut off their blood supply. Sclerotherapy. Medicine is injected into the hemorrhoids to shrink them. Infrared coagulation. A type of light energy is used to get rid of the hemorrhoids. Hemorrhoidectomy surgery. The hemorrhoids are surgically removed, and the veins that supply them are tied off. Stapled hemorrhoidopexy surgery. The surgeon staples the base of the hemorrhoid to the rectal wall. Follow these instructions at home: Eating and drinking  Eat foods that have a lot of fiber in them, such as whole grains, beans, nuts, fruits, and vegetables. Ask your health care provider about taking products that have added fiber (fiber supplements). Reduce the amount of fat in your diet. You can do this by eating low-fat dairy products, eating less red meat, and avoiding processed foods. Drink enough fluid to keep your urine pale yellow. Managing pain and swelling  Take warm sitz baths for 20 minutes, 3-4 times a day to ease pain and discomfort. You may do this in a bathtub or using a portable sitz bath that fits over the toilet. If directed, apply ice to the affected area. Using ice packs between sitz baths may be helpful. Put ice in a plastic bag. Place a towel between your skin and the bag. Leave the ice on for 20 minutes, 2-3 times a day. General instructions Take over-the-counter and prescription medicines only as told by your health care provider. Use medicated creams or suppositories  as told. Get regular exercise. Ask your health care provider how much and what kind of exercise is best for you. In general, you should do moderate exercise for at least 30 minutes on most days of the week (150 minutes each week). This can include activities such as walking,  biking, or yoga. Go to the bathroom when you have the urge to have a bowel movement. Do not wait. Avoid straining to have bowel movements. Keep the anal area dry and clean. Use wet toilet paper or moist towelettes after a bowel movement. Do not sit on the toilet for long periods of time. This increases blood pooling and pain. Keep all follow-up visits as told by your health care provider. This is important. Contact a health care provider if you have: Increasing pain and swelling that are not controlled by treatment or medicine. Difficulty having a bowel movement, or you are unable to have a bowel movement. Pain or inflammation outside the area of the hemorrhoids. Get help right away if you have: Uncontrolled bleeding from your rectum. Summary Hemorrhoids are swollen veins in and around the rectum or anus. Most hemorrhoids can be managed with home treatments such as diet and lifestyle changes. Taking warm sitz baths can help ease pain and discomfort. In severe cases, procedures or surgery can be done to shrink or remove the hemorrhoids. This information is not intended to replace advice given to you by your health care provider. Make sure you discuss any questions you have with your health care provider. Document Revised: 06/12/2021 Document Reviewed: 06/13/2021 Elsevier Patient Education  [place].

## 2023-02-04 ENCOUNTER — Encounter: Payer: Self-pay | Admitting: Internal Medicine

## 2023-02-04 ENCOUNTER — Ambulatory Visit (INDEPENDENT_AMBULATORY_CARE_PROVIDER_SITE_OTHER): Payer: Medicare Other | Admitting: Internal Medicine

## 2023-02-04 VITALS — BP 120/70 | HR 64 | Ht 65.0 in | Wt 155.4 lb

## 2023-02-04 DIAGNOSIS — C259 Malignant neoplasm of pancreas, unspecified: Secondary | ICD-10-CM | POA: Diagnosis not present

## 2023-02-04 DIAGNOSIS — R188 Other ascites: Secondary | ICD-10-CM | POA: Diagnosis not present

## 2023-02-04 DIAGNOSIS — K648 Other hemorrhoids: Secondary | ICD-10-CM

## 2023-02-04 DIAGNOSIS — C787 Secondary malignant neoplasm of liver and intrahepatic bile duct: Secondary | ICD-10-CM | POA: Diagnosis not present

## 2023-02-04 NOTE — Patient Instructions (Addendum)
Use Prep H suppository as needed.  Follow up as needed _______________________________________________________  If your blood pressure at your visit was 140/90 or greater, please contact your primary care physician to follow up on this.  _______________________________________________________  If you are age 87 or older, your body mass index should be between 23-30. Your Body mass index is 25.86 kg/m. If this is out of the aforementioned range listed, please consider follow up with your Primary Care Provider.  If you are age 46 or younger, your body mass index should be between 19-25. Your Body mass index is 25.86 kg/m. If this is out of the aformentioned range listed, please consider follow up with your Primary Care Provider.   ________________________________________________________  The [facility] providers would like to encourage you to use [facility] to communicate with providers for non-urgent requests or questions.  Due to long hold times on the telephone, sending your provider a message by [facility] may be a faster and more efficient way to get a response.  Please allow 48 business hours for a response.  Please remember that this is for non-urgent requests.  _______________________________________________________ Thank you for trusting me with your gastrointestinal care!   Silvano Rusk MD

## 2023-02-04 NOTE — Progress Notes (Signed)
Gary [name] 89 y.o. 1932-10-30 [id number]  Assessment & Plan:   Encounter Diagnoses  Name Primary?   Pancreatic cancer metastasized to liver [name]) Yes   Other ascites    Hemorrhoids, complicated    He has done amazingly well with his pancreatic cancer and I hope that continues for him.  His ascites is controlled.  Hemorrhoids may be treated with as needed Preparation H.  Follow-up here as needed  CC: [name], MD   Subjective:   Chief Complaint: Hemorrhoids, follow-up of ascites and pancreatic cancer  HPI 89 year old [name] man with a history of pancreatic cancer in the tail metastasized to the liver, successfully treated with chemotherapy who also has had ascites.  The ascites was difficult to control and required paracentesis but over time he is on a very low-dose every other day diuretic regimen and things are controlled.  The ascites was originally thought to be related to the pancreatic cancer by me, but that was incorrect and it was more of a portal hypertension type situation.  He saw Dr. Benay Spice yesterday, CA 19-9 is pending.  He had labs through primary care within the last month or so and I have seen those and those are in to be scanned, his chronic kidney disease is improved and his other labs were normal or stable.  In general he has felt pretty well other than a lot of fatigue.  His daughter and wife are with him today and they endorse that as well.  He tells me he has difficulty falling asleep for 3 or 4 hours but then sleeps for 8 or 9 hours.  He is trying to remain as active as possible.  He had some straining to stool recently and has had some swelling and soreness in the rectum though that is better since that occurred last week.  He used Preparation H and wonders if he has hemorrhoids.  He is also concerned about the possibility of this being rectal cancer.  No bleeding.    Wt Readings from Last 3 Encounters:  02/04/23 155 lb 6.4 oz (70.5 kg)   02/03/23 155 lb 6.4 oz (70.5 kg)  12/05/22 157 lb (71.2 kg)     Allergies  Allergen Reactions   Amlodipine Other (See Comments)   Gentamycin [Gentamicin Sulfate]     "Inner ears problems after Gent infusion" , also "unsteady mobility"   Hydrocodone     Other reaction(s): unknown   Aspirin Other (See Comments)    High doses cause stomach upset   Current Meds  Medication Sig   acetaminophen (TYLENOL) 325 MG tablet 1 tablet as needed   lidocaine-prilocaine (EMLA) cream Apply 1 application topically as directed. Apply 1 hour prior to IV stick and cover with plastic wrap   omeprazole (PRILOSEC) 20 MG capsule Take 1 capsule by mouth daily.   spironolactone (ALDACTONE) 25 MG tablet Take 1 tablet (25 mg total) by mouth every other day.   Past Medical History:  Diagnosis Date   Ascending aortic aneurysm ([name]) 01/13/2014   stable at 4.7 cm diameter since 2009   Asystole x2, s/p pacemaker 01/13/2014   Bradycardia    permanent pacemaker 11/22/08 [employer]Jude   Family history of adverse reaction to anesthesia    brother died after anesthesia / nose polyp removal at [facility] (40 years ago)   HTN (hypertension) 01/13/2014   Hyperlipidemia    Hypertension    Malignant ascites    Pacemaker - leads 1987, last generator 2009 [employer] Jude dual-chamber  01/13/2014   Atrial lead model 433-01, ventricular lead model 431-01 implanted November 1987 Right ventricular lead dysfunction with low impedance and mediocre sensing but satisfactory pacing threshold   Pacemaker lead malfunction 01/13/2014   Chronic low impedance and poor sensing of the ventricular lead   Pancreatic cancer ([name])    Metastatic to liver   Past Surgical History:  Procedure Laterality Date   IR IMAGING GUIDED PORT INSERTION  05/24/2020   IR PARACENTESIS  10/26/2020   IR PARACENTESIS  12/12/2020   IR PARACENTESIS  01/22/2021   IR PARACENTESIS  02/02/2021   IR PARACENTESIS  02/14/2021   IR PARACENTESIS  02/23/2021   IR PARACENTESIS  03/06/2021   IR  PARACENTESIS  03/14/2021   IR PARACENTESIS  03/22/2021   IR PARACENTESIS  03/29/2021   IR PARACENTESIS  04/06/2021   IR PARACENTESIS  04/17/2021   IR PARACENTESIS  04/27/2021   IR PARACENTESIS  05/09/2021   IR PARACENTESIS  05/22/2021   IR PARACENTESIS  09/07/2021   PERMANENT PACEMAKER GENERATOR CHANGE  11/22/08   [employer]. Jude   PERMANENT PACEMAKER INSERTION  03/04/97   [employer]. Jude   US ECHOCARDIOGRAPHY  05/16/11   AS mild to mod.,TR mild to mod., EF => 55%.   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   Review of Systems As per HPI  Objective:   Physical Exam BP 120/70   Pulse 64   Ht 5' 5"$  (1.651 m)   Wt 155 lb 6.4 oz (70.5 kg)   BMI 25.86 kg/m  Elderly man no acute distress Lungs are clear Heart sounds are normal S1-S2 no rubs murmurs or gallops Abdomen is soft nontender no obvious ascites Rectal exam shows a normal anoderm and formed brown stool no mass.  Nontender.  Anoscopy shows grade 1 slightly inflamed internal and external hemorrhoid complexes in all 3 positions.  Data reviewed includes oncology note from yesterday, labs in the EMR from this year, and those things mentioned in the HPI.  32 minutes total time spent before and after the visit and with the patient excluding time for a anoscopy

## 2023-02-06 DIAGNOSIS — H6123 Impacted cerumen, bilateral: Secondary | ICD-10-CM | POA: Diagnosis not present

## 2023-02-06 LAB — CANCER ANTIGEN 19-9: CA 19-9: 775 U/mL — ABNORMAL HIGH (ref 0–35)

## 2023-02-12 ENCOUNTER — Other Ambulatory Visit: Payer: Self-pay | Admitting: Internal Medicine

## 2023-03-08 DIAGNOSIS — M25571 Pain in right ankle and joints of right foot: Secondary | ICD-10-CM | POA: Diagnosis not present

## 2023-03-12 ENCOUNTER — Other Ambulatory Visit: Payer: Self-pay | Admitting: Internal Medicine

## 2023-03-12 NOTE — Telephone Encounter (Signed)
Rx sent 

## 2023-03-12 NOTE — Telephone Encounter (Signed)
Please advise Sir. Looks like Dr [employer] refilled last. Thank you.

## 2023-03-18 ENCOUNTER — Inpatient Hospital Stay: Payer: Medicare Other

## 2023-03-18 ENCOUNTER — Inpatient Hospital Stay: Payer: Medicare Other | Attending: Oncology

## 2023-03-18 VITALS — BP 150/74 | HR 67 | Temp 98.1°F | Resp 18 | Wt 154.2 lb

## 2023-03-18 DIAGNOSIS — C252 Malignant neoplasm of tail of pancreas: Secondary | ICD-10-CM | POA: Insufficient documentation

## 2023-03-18 DIAGNOSIS — Z95828 Presence of other vascular implants and grafts: Secondary | ICD-10-CM

## 2023-03-18 DIAGNOSIS — Z452 Encounter for adjustment and management of vascular access device: Secondary | ICD-10-CM | POA: Insufficient documentation

## 2023-03-18 MED ORDER — SODIUM CHLORIDE 0.9% FLUSH
10.0000 mL | INTRAVENOUS | Status: DC | PRN
  Administered 2023-03-18: 10 mL

## 2023-03-18 MED ORDER — HEPARIN SOD (PORK) LOCK FLUSH 100 UNIT/ML IV SOLN
500.0000 [IU] | Freq: Once | INTRAVENOUS | Status: AC | PRN
  Administered 2023-03-18: 500 [IU]

## 2023-03-18 NOTE — Patient Instructions (Signed)

## 2023-03-25 DIAGNOSIS — M1711 Unilateral primary osteoarthritis, right knee: Secondary | ICD-10-CM | POA: Diagnosis not present

## 2023-03-25 DIAGNOSIS — M25571 Pain in right ankle and joints of right foot: Secondary | ICD-10-CM | POA: Diagnosis not present

## 2023-04-01 DIAGNOSIS — H00011 Hordeolum externum right upper eyelid: Secondary | ICD-10-CM | POA: Diagnosis not present

## 2023-04-01 DIAGNOSIS — H10501 Unspecified blepharoconjunctivitis, right eye: Secondary | ICD-10-CM | POA: Diagnosis not present

## 2023-04-10 DIAGNOSIS — L602 Onychogryphosis: Secondary | ICD-10-CM | POA: Diagnosis not present

## 2023-04-10 DIAGNOSIS — M19072 Primary osteoarthritis, left ankle and foot: Secondary | ICD-10-CM | POA: Diagnosis not present

## 2023-04-10 DIAGNOSIS — I739 Peripheral vascular disease, unspecified: Secondary | ICD-10-CM | POA: Diagnosis not present

## 2023-04-10 DIAGNOSIS — M19071 Primary osteoarthritis, right ankle and foot: Secondary | ICD-10-CM | POA: Diagnosis not present

## 2023-05-06 ENCOUNTER — Inpatient Hospital Stay: Payer: Medicare Other

## 2023-05-06 ENCOUNTER — Inpatient Hospital Stay (HOSPITAL_BASED_OUTPATIENT_CLINIC_OR_DEPARTMENT_OTHER): Payer: Medicare Other | Admitting: Oncology

## 2023-05-06 ENCOUNTER — Inpatient Hospital Stay: Payer: Medicare Other | Attending: Oncology

## 2023-05-06 VITALS — BP 147/65 | HR 60 | Temp 98.1°F | Resp 18 | Ht 65.0 in | Wt 151.5 lb

## 2023-05-06 DIAGNOSIS — C252 Malignant neoplasm of tail of pancreas: Secondary | ICD-10-CM | POA: Insufficient documentation

## 2023-05-06 DIAGNOSIS — I10 Essential (primary) hypertension: Secondary | ICD-10-CM | POA: Insufficient documentation

## 2023-05-06 DIAGNOSIS — K219 Gastro-esophageal reflux disease without esophagitis: Secondary | ICD-10-CM | POA: Diagnosis not present

## 2023-05-06 DIAGNOSIS — E785 Hyperlipidemia, unspecified: Secondary | ICD-10-CM | POA: Insufficient documentation

## 2023-05-06 DIAGNOSIS — C787 Secondary malignant neoplasm of liver and intrahepatic bile duct: Secondary | ICD-10-CM | POA: Diagnosis not present

## 2023-05-06 LAB — BASIC METABOLIC PANEL - CANCER CENTER ONLY
Anion gap: 8 (ref 5–15)
BUN: 28 mg/dL — ABNORMAL HIGH (ref 8–23)
CO2: 23 mmol/L (ref 22–32)
Calcium: 9.3 mg/dL (ref 8.9–10.3)
Chloride: 106 mmol/L (ref 98–111)
Creatinine: 1.27 mg/dL — ABNORMAL HIGH (ref 0.61–1.24)
GFR, Estimated: 54 mL/min — ABNORMAL LOW (ref >60)
Glucose, Bld: 123 mg/dL — ABNORMAL HIGH (ref 70–99)
Potassium: 4 mmol/L (ref 3.5–5.1)
Sodium: 137 mmol/L (ref 135–145)

## 2023-05-06 NOTE — Progress Notes (Signed)
[facility] OFFICE PROGRESS NOTE   Diagnosis: Pancreas cancer  INTERVAL HISTORY:   Gary [name] returns as scheduled.  He is here with his son.  He has multiple complaints today including intermittent "sciatica ", and insect bite the upper back, and joint pain.  He relates weight loss to the recent illness of his wife.  She had a knee replacement and was not providing his usual meals.  He has not required a repeat paracentesis.  Objective:  Vital signs in last 24 hours:  Blood pressure (!) 147/65, pulse 60, temperature 98.1 F (36.7 C), temperature source Oral, resp. rate 18, height 5\' 5"  (1.651 m), weight 151 lb 8 oz (68.7 kg), SpO2 100 %.    Lymphatics: No cervical, supraclavicular, axillary, or inguinal nodes Resp: Lungs clear bilaterally Cardio: Regular rate and rhythm GI: No hepatosplenomegaly, no apparent ascites, nontender, no mass Vascular: No leg edema  Skin: Noninflamed sebaceous cyst at the right upper back  Portacath/PICC-without erythema  Lab Results:  Lab Results  Component Value Date   WBC 9.0 10/19/2021   HGB 12.0 (L) 10/19/2021   HCT 38.0 (L) 10/19/2021   MCV 84.1 10/19/2021   PLT 177 10/19/2021   NEUTROABS 6.2 08/02/2021    CMP  Lab Results  Component Value Date   NA 137 05/06/2023   K 4.0 05/06/2023   CL 106 05/06/2023   CO2 23 05/06/2023   GLUCOSE 123 (H) 05/06/2023   BUN 28 (H) 05/06/2023   CREATININE 1.27 (H) 05/06/2023   CALCIUM 9.3 05/06/2023   PROT 7.6 03/12/2022   ALBUMIN 3.8 03/12/2022   AST 25 03/12/2022   ALT 27 03/12/2022   ALKPHOS 105 03/12/2022   BILITOT 0.6 03/12/2022   GFRNONAA 54 (L) 05/06/2023   GFRAA >60 09/05/2020    Lab Results  Component Value Date   [id number] 775 (H) 02/03/2023    Medications: I have reviewed the patient's current medications.   Assessment/Plan:  Pancreas tail lesion, multiple liver lesions Chest CT 04/12/2020-ascending thoracic aortic aneurysm; hypovascular lesion tail of the  pancreas, new compared to the prior examination.  Multiple poorly defined hypovascular lesions scattered throughout the liver CT abdomen/pelvis 05/02/2020-hypoenhancing lesion of the pancreatic tail measuring approximately 2.4 x 1.9 cm; enlarged portacaval lymph nodes measuring up to 1.9 x 1.4 cm; numerous hypodense lesions throughout the liver, index lesion of the liver dome measuring 1.7 x 1.2 cm. Ultrasound-guided biopsy of a left liver lesion 05/10/2020-poorly differentiated carcinoma consistent with a pancreatobiliary primary Markedly elevated CA 19-9  Cycle 1 day 1 gemcitabine/Abraxane 05/26/2020 Cycle 1 day 15 gemcitabine/Abraxane 06/12/2020 Cycle 2 day 1 gemcitabine/Abraxane 07/04/2020 (chemotherapy doses reduced, antiemetic regimen adjusted) Cycle 2 day 15 Gemcitabine/Abraxane 07/18/2020 (continue same dose reduction, reduce post-treatment steroids due to insomnia) Cycle 3 day 1 gem/abraxane 08/02/20 Cycle 3 day 15 gem/Abraxane 08/15/2020 CTs abdomen/pelvis 08/25/2020-pancreas tail mass and portacaval lymphadenopathy decreased.  Liver metastases reported as increased, further review with no significant change in the majority of liver lesions and a decrease in the size of a few lesions, no new margin lesions Cycle 4-day 1 gemcitabine/Abraxane 09/05/2020 Paracentesis 09/26/2020-reactive mesothelial cells Paracentesis 10/09/2020-reactive mesothelial cells Cycle 5 gemcitabine/Abraxane 10/12/2020 Cycle 6 gemcitabine/Abraxane 11/01/2020 Cycle 7 gemcitabine/Abraxane 11/22/2020 Cycle 8 gemcitabine/Abraxane 12/13/2020 Cycle 9 gemcitabine/Abraxane 01/03/2021 CT abdomen/pelvis 01/29/2021-decrease in pancreas tail mass, decrease in size of multiple hepatic metastases, decreased size of hepatoduodenal ligament lymph nodes, progressive ascites Cycle 10 gemcitabine/Abraxane 01/31/2021 CT abdomen/pelvis 04/13/2021- generally stable liver lesions, some have slightly decreased and others slightly  increased compared to  the CT 01/29/2021, pancreas mass not appreciated, large volume ascites CT abdomen/pelvis 08/02/2021-multiple liver lesions-some are unchanged, fluid density lesions are present-some larger-treated metastases?,  Increased small right pleural effusion, ascites, tail of pancreas lesion Not measurable CT abdomen/pelvis 08/31/2021-increased size of right inguinal hernia containing ascites, unchanged ascites and small right pleural effusion, stable liver lesions, no pancreas mass CT abdomen/pelvis 01/04/2022-unchanged hypoenhancing liver lesions; moderate volume ascites; primary pancreatic malignancy not clearly visualized; coarse nodular contour of liver; large hiatal hernia     Thoracic aortic aneurysm Hypertension Hyperlipidemia History of bradycardia status post permanent pacemaker Mother had cervical cancer Pitting lower leg edema GERD, on omeprazole  Right upper back inflamed sebaceous cyst-culture from cyst drainage 10/23/2020-2 staph species, antibiotic changed to ciprofloxacin 10/27/2020, status post I&D procedure 11/17/2020 Refractory ascites-trial of spironolactone 03/23/2021; spironolactone and Lasix 04/04/2021--improved 06/21/2021       Disposition: Gary [name] has a history of metastatic pancreas cancer.  There is no clinical evidence of disease progression.  The CA 19-9 was not significantly changed on 02/03/2023.  He would like to change to an every 8-week Port-A-Cath flush schedule.  He will return for an office visit and CA 19-9 and 16 weeks.  His son will contact us if he develops new symptoms.  Will follow-up on the CA 19-9 from today.  The plan is to schedule restaging CTs if there is a significant rise in the CA 19-9.  Thornton Papas, MD  05/06/2023  3:04 PM

## 2023-05-07 ENCOUNTER — Other Ambulatory Visit: Payer: Self-pay | Admitting: *Deleted

## 2023-05-07 DIAGNOSIS — C252 Malignant neoplasm of tail of pancreas: Secondary | ICD-10-CM

## 2023-05-08 LAB — CANCER ANTIGEN 19-9: CA 19-9: 684 U/mL — ABNORMAL HIGH (ref 0–35)

## 2023-05-09 ENCOUNTER — Inpatient Hospital Stay: Payer: Medicare Other

## 2023-05-09 ENCOUNTER — Telehealth: Payer: Self-pay

## 2023-05-09 NOTE — Progress Notes (Signed)
[facility] Clinical Social Work  Clinical Social Work was referred by nurse for assessment of psychosocial needs.  Clinical Social Worker contacted caregiver by phone to offer support and assess for needs.    CSW spoke with patient's son, Ree Kida.  He was with his father during a recent appointment at Dr. Kalman Drape office and stated he was experiencing some depression due to his loss of independence.  Ree Kida said his father is open to counseling.  A session is scheduled for Tuesday, 5/28, at 3pm.     Dorothey Baseman, LCSW  Clinical Social Worker [facility]

## 2023-05-09 NOTE — Telephone Encounter (Signed)
CSW attempted to contact patient per the referral from Dr. Kalman Drape RN.  Left vm.

## 2023-05-13 ENCOUNTER — Inpatient Hospital Stay: Payer: Medicare Other

## 2023-05-13 NOTE — Progress Notes (Signed)
[facility] Clinical Social Work  Initial Assessment   Gary [name] is a 89 y.o. year old male accompanied by daughter and spouse. Clinical Social Work was referred by nurse for assessment of psychosocial needs.   SDOH (Social Determinants of Health) assessments performed: Yes SDOH Interventions    Flowsheet Row Clinical Support from 05/13/2023 in [facility]  SDOH Interventions   Food Insecurity Interventions Intervention Not Indicated  Housing Interventions Intervention Not Indicated  Transportation Interventions Intervention Not Indicated  Utilities Interventions Intervention Not Indicated  Financial Strain Interventions Intervention Not Indicated  Stress Interventions Provide Counseling  Social Connections Interventions Intervention Not Indicated       SDOH Screenings   Food Insecurity: No Food Insecurity (05/13/2023)  Housing: Low Risk  (05/13/2023)  Transportation Needs: No Transportation Needs (05/13/2023)  Utilities: Not At Risk (05/13/2023)  Financial Resource Strain: Low Risk  (05/13/2023)  Social Connections: Socially Integrated (05/13/2023)  Stress: Stress Concern Present (05/13/2023)  Tobacco Use: Low Risk  (02/04/2023)     Distress Screen completed: No     No data to display            Family/Social Information:  Housing Arrangement: patient lives with his wife. Family members/support persons in your life? Family and Friends Transportation concerns: no  Employment: Retired.  Was in Industrial/product designer.  Income source: Actor concerns: No Type of concern: None Food access concerns: no Religious or spiritual practice: Yes Services Currently in place:  Medicare  Coping/ Adjustment to diagnosis: Patient understands treatment plan and what happens next? yes Concerns about diagnosis and/or treatment: Afraid of cancer Patient reported stressors: Adjusting to my illness Hopes and/or  priorities: Family Patient enjoys gardening and time with family/ friends Current coping skills/ strengths: Active sense of humor , Average or above average intelligence , Capable of independent living , Communication skills , Contractor , General fund of knowledge , Religious Affiliation , and Supportive family/friends     SUMMARY: Current SDOH Barriers:  Feelings of stress.  Clinical Social Work Clinical Goal(s):  Demonstrate a reduction in symptoms related to :Anxiety   Interventions: Discussed common feeling and emotions when being diagnosed with cancer, and the importance of support during treatment Informed patient of the support team roles and support services at [facility] Provided CSW contact information and encouraged patient to call with any questions or concerns Provided patient with information about various techniques to assist with decreasing anxiety symptoms, including guided imagery before bedtime.   Follow Up Plan: Patient will contact CSW with any support or resource needs Patient verbalizes understanding of plan: Yes    Dorothey Baseman, LCSW Clinical Social Worker [facility]

## 2023-05-19 DIAGNOSIS — L218 Other seborrheic dermatitis: Secondary | ICD-10-CM | POA: Diagnosis not present

## 2023-06-20 DIAGNOSIS — M545 Low back pain, unspecified: Secondary | ICD-10-CM | POA: Diagnosis not present

## 2023-07-01 ENCOUNTER — Inpatient Hospital Stay: Payer: Medicare Other | Attending: Oncology

## 2023-07-01 VITALS — BP 140/69 | HR 66 | Temp 98.4°F | Resp 18 | Ht 65.0 in | Wt 152.7 lb

## 2023-07-01 DIAGNOSIS — C252 Malignant neoplasm of tail of pancreas: Secondary | ICD-10-CM | POA: Insufficient documentation

## 2023-07-01 DIAGNOSIS — Z95828 Presence of other vascular implants and grafts: Secondary | ICD-10-CM

## 2023-07-01 DIAGNOSIS — Z452 Encounter for adjustment and management of vascular access device: Secondary | ICD-10-CM | POA: Diagnosis not present

## 2023-07-01 MED ORDER — HEPARIN SOD (PORK) LOCK FLUSH 100 UNIT/ML IV SOLN
500.0000 [IU] | Freq: Once | INTRAVENOUS | Status: AC | PRN
  Administered 2023-07-01: 500 [IU]

## 2023-07-01 MED ORDER — SODIUM CHLORIDE 0.9% FLUSH
10.0000 mL | INTRAVENOUS | Status: DC | PRN
  Administered 2023-07-01: 10 mL

## 2023-07-01 NOTE — Patient Instructions (Signed)

## 2023-07-07 DIAGNOSIS — I739 Peripheral vascular disease, unspecified: Secondary | ICD-10-CM | POA: Diagnosis not present

## 2023-07-07 DIAGNOSIS — M19071 Primary osteoarthritis, right ankle and foot: Secondary | ICD-10-CM | POA: Diagnosis not present

## 2023-07-07 DIAGNOSIS — L602 Onychogryphosis: Secondary | ICD-10-CM | POA: Diagnosis not present

## 2023-07-07 DIAGNOSIS — M19072 Primary osteoarthritis, left ankle and foot: Secondary | ICD-10-CM | POA: Diagnosis not present

## 2023-07-29 DIAGNOSIS — R5383 Other fatigue: Secondary | ICD-10-CM | POA: Diagnosis not present

## 2023-08-26 ENCOUNTER — Inpatient Hospital Stay: Payer: Medicare Other | Attending: Oncology

## 2023-08-26 ENCOUNTER — Inpatient Hospital Stay (HOSPITAL_BASED_OUTPATIENT_CLINIC_OR_DEPARTMENT_OTHER): Payer: Medicare Other | Admitting: Oncology

## 2023-08-26 ENCOUNTER — Inpatient Hospital Stay: Payer: Medicare Other

## 2023-08-26 VITALS — BP 142/71 | HR 60 | Temp 98.1°F | Resp 18 | Ht 65.0 in | Wt 151.3 lb

## 2023-08-26 DIAGNOSIS — C252 Malignant neoplasm of tail of pancreas: Secondary | ICD-10-CM | POA: Diagnosis not present

## 2023-08-26 DIAGNOSIS — K219 Gastro-esophageal reflux disease without esophagitis: Secondary | ICD-10-CM | POA: Insufficient documentation

## 2023-08-26 DIAGNOSIS — C787 Secondary malignant neoplasm of liver and intrahepatic bile duct: Secondary | ICD-10-CM | POA: Diagnosis not present

## 2023-08-26 DIAGNOSIS — Z452 Encounter for adjustment and management of vascular access device: Secondary | ICD-10-CM | POA: Insufficient documentation

## 2023-08-26 DIAGNOSIS — E785 Hyperlipidemia, unspecified: Secondary | ICD-10-CM | POA: Diagnosis not present

## 2023-08-26 DIAGNOSIS — I10 Essential (primary) hypertension: Secondary | ICD-10-CM | POA: Diagnosis not present

## 2023-08-26 DIAGNOSIS — Z95828 Presence of other vascular implants and grafts: Secondary | ICD-10-CM

## 2023-08-26 LAB — BASIC METABOLIC PANEL - CANCER CENTER ONLY
Anion gap: 8 (ref 5–15)
BUN: 26 mg/dL — ABNORMAL HIGH (ref 8–23)
CO2: 24 mmol/L (ref 22–32)
Calcium: 9.2 mg/dL (ref 8.9–10.3)
Chloride: 107 mmol/L (ref 98–111)
Creatinine: 1.31 mg/dL — ABNORMAL HIGH (ref 0.61–1.24)
GFR, Estimated: 52 mL/min — ABNORMAL LOW (ref >60)
Glucose, Bld: 90 mg/dL (ref 70–99)
Potassium: 4.4 mmol/L (ref 3.5–5.1)
Sodium: 139 mmol/L (ref 135–145)

## 2023-08-26 MED ORDER — HEPARIN SOD (PORK) LOCK FLUSH 100 UNIT/ML IV SOLN
500.0000 [IU] | Freq: Once | INTRAVENOUS | Status: AC
  Administered 2023-08-26: 500 [IU] via INTRAVENOUS

## 2023-08-26 MED ORDER — SODIUM CHLORIDE 0.9% FLUSH
10.0000 mL | INTRAVENOUS | Status: DC | PRN
  Administered 2023-08-26: 10 mL via INTRAVENOUS

## 2023-08-26 NOTE — Progress Notes (Signed)
[facility] OFFICE PROGRESS NOTE   Diagnosis: Pancreas cancer  INTERVAL HISTORY:   Mr. Ally returns as scheduled.  He is here with his son.  He has "arthritis "discomfort at the right ankle.  This only hurts when he lies on the ankle at night.  He has pruritus in the groin.  There is a persistent right inguinal hernia.  Objective:  Vital signs in last 24 hours:  Blood pressure (!) 142/71, pulse 60, temperature 98.1 F (36.7 C), temperature source Oral, resp. rate 18, height 5\' 5"  (1.651 m), weight 151 lb 4.8 oz (68.6 kg), SpO2 100%.     Lymphatics: No cervical, supraclavicular, axillary, or inguinal nodes Resp: Lungs clear bilaterally Cardio: Regular rate and rhythm GI: Nondistended, no mass, no hepatosplenomegaly, no apparent ascites mild partially reducible right inguinal hernia Vascular: No leg edema  Musculoskeletal: Mild erythema with callus formation at the right lateral malleolus  Portacath/PICC-without erythema  Lab Results:  Lab Results  Component Value Date   WBC 9.0 10/19/2021   HGB 12.0 (L) 10/19/2021   HCT 38.0 (L) 10/19/2021   MCV 84.1 10/19/2021   PLT 177 10/19/2021   NEUTROABS 6.2 08/02/2021    CMP  Lab Results  Component Value Date   NA 139 08/26/2023   K 4.4 08/26/2023   CL 107 08/26/2023   CO2 24 08/26/2023   GLUCOSE 90 08/26/2023   BUN 26 (H) 08/26/2023   CREATININE 1.31 (H) 08/26/2023   CALCIUM 9.2 08/26/2023   PROT 7.6 03/12/2022   ALBUMIN 3.8 03/12/2022   AST 25 03/12/2022   ALT 27 03/12/2022   ALKPHOS 105 03/12/2022   BILITOT 0.6 03/12/2022   GFRNONAA 52 (L) 08/26/2023   GFRAA >60 09/05/2020    Lab Results  Component Value Date   [id number] 684 (H) 05/06/2023     Medications: I have reviewed the patient's current medications.   Assessment/Plan: Pancreas tail lesion, multiple liver lesions Chest CT 04/12/2020-ascending thoracic aortic aneurysm; hypovascular lesion tail of the pancreas, new compared to the  prior examination.  Multiple poorly defined hypovascular lesions scattered throughout the liver CT abdomen/pelvis 05/02/2020-hypoenhancing lesion of the pancreatic tail measuring approximately 2.4 x 1.9 cm; enlarged portacaval lymph nodes measuring up to 1.9 x 1.4 cm; numerous hypodense lesions throughout the liver, index lesion of the liver dome measuring 1.7 x 1.2 cm. Ultrasound-guided biopsy of a left liver lesion 05/10/2020-poorly differentiated carcinoma consistent with a pancreatobiliary primary Markedly elevated CA 19-9  Cycle 1 day 1 gemcitabine/Abraxane 05/26/2020 Cycle 1 day 15 gemcitabine/Abraxane 06/12/2020 Cycle 2 day 1 gemcitabine/Abraxane 07/04/2020 (chemotherapy doses reduced, antiemetic regimen adjusted) Cycle 2 day 15 Gemcitabine/Abraxane 07/18/2020 (continue same dose reduction, reduce post-treatment steroids due to insomnia) Cycle 3 day 1 gem/abraxane 08/02/20 Cycle 3 day 15 gem/Abraxane 08/15/2020 CTs abdomen/pelvis 08/25/2020-pancreas tail mass and portacaval lymphadenopathy decreased.  Liver metastases reported as increased, further review with no significant change in the majority of liver lesions and a decrease in the size of a few lesions, no new margin lesions Cycle 4-day 1 gemcitabine/Abraxane 09/05/2020 Paracentesis 09/26/2020-reactive mesothelial cells Paracentesis 10/09/2020-reactive mesothelial cells Cycle 5 gemcitabine/Abraxane 10/12/2020 Cycle 6 gemcitabine/Abraxane 11/01/2020 Cycle 7 gemcitabine/Abraxane 11/22/2020 Cycle 8 gemcitabine/Abraxane 12/13/2020 Cycle 9 gemcitabine/Abraxane 01/03/2021 CT abdomen/pelvis 01/29/2021-decrease in pancreas tail mass, decrease in size of multiple hepatic metastases, decreased size of hepatoduodenal ligament lymph nodes, progressive ascites Cycle 10 gemcitabine/Abraxane 01/31/2021 CT abdomen/pelvis 04/13/2021- generally stable liver lesions, some have slightly decreased and others slightly increased compared to the CT 01/29/2021, pancreas mass  not appreciated, large volume ascites CT abdomen/pelvis 08/02/2021-multiple liver lesions-some are unchanged, fluid density lesions are present-some larger-treated metastases?,  Increased small right pleural effusion, ascites, tail of pancreas lesion Not measurable CT abdomen/pelvis 08/31/2021-increased size of right inguinal hernia containing ascites, unchanged ascites and small right pleural effusion, stable liver lesions, no pancreas mass CT abdomen/pelvis 01/04/2022-unchanged hypoenhancing liver lesions; moderate volume ascites; primary pancreatic malignancy not clearly visualized; coarse nodular contour of liver; large hiatal hernia     Thoracic aortic aneurysm Hypertension Hyperlipidemia History of bradycardia status post permanent pacemaker Mother had cervical cancer Pitting lower leg edema GERD, on omeprazole  Right upper back inflamed sebaceous cyst-culture from cyst drainage 10/23/2020-2 staph species, antibiotic changed to ciprofloxacin 10/27/2020, status post I&D procedure 11/17/2020 Refractory ascites-trial of spironolactone 03/23/2021; spironolactone and Lasix 04/04/2021--improved 06/21/2021        Disposition: Mr. Jore appears stable.  There is no evidence for recurrent ascites or other progression of the metastatic pancreas cancer.  We will follow-up on the CA 19-9 from today.  I recommended he follow-up with orthopedics to evaluate the right ankle discomfort.  A Port-A-Cath remains in place.  He will return for a Port-A-Cath flush in 8 weeks and an office visit in 16 weeks.  I recommended he obtain a COVID-19 and influenza vaccine.  Thornton Papas, MD  08/26/2023  3:35 PM

## 2023-08-27 ENCOUNTER — Telehealth: Payer: Self-pay | Admitting: *Deleted

## 2023-08-27 LAB — CANCER ANTIGEN 19-9: CA 19-9: 717 U/mL — ABNORMAL HIGH (ref 0–35)

## 2023-08-27 NOTE — Telephone Encounter (Signed)
Notified Mrs. Gary [name] that his CA 19.9 is stable and of no concern per Dr. Truett Perna.

## 2023-08-27 NOTE — Telephone Encounter (Signed)
-----   Message from Thornton Papas sent at 08/27/2023  7:55 AM EDT ----- Please call patient, the CA19-9 is stable, f/u as scheduled

## 2023-09-12 ENCOUNTER — Emergency Department (HOSPITAL_BASED_OUTPATIENT_CLINIC_OR_DEPARTMENT_OTHER): Payer: Medicare Other

## 2023-09-12 ENCOUNTER — Observation Stay (HOSPITAL_BASED_OUTPATIENT_CLINIC_OR_DEPARTMENT_OTHER)
Admission: EM | Admit: 2023-09-12 | Discharge: 2023-09-13 | Disposition: A | Discharge status: Home / Self Care | Payer: Medicare Other | Attending: Internal Medicine | Admitting: Internal Medicine

## 2023-09-12 ENCOUNTER — Other Ambulatory Visit: Payer: Self-pay

## 2023-09-12 ENCOUNTER — Encounter (HOSPITAL_BASED_OUTPATIENT_CLINIC_OR_DEPARTMENT_OTHER): Payer: Self-pay

## 2023-09-12 DIAGNOSIS — R1011 Right upper quadrant pain: Secondary | ICD-10-CM | POA: Diagnosis not present

## 2023-09-12 DIAGNOSIS — K828 Other specified diseases of gallbladder: Secondary | ICD-10-CM | POA: Diagnosis not present

## 2023-09-12 DIAGNOSIS — R10811 Right upper quadrant abdominal tenderness: Secondary | ICD-10-CM | POA: Diagnosis present

## 2023-09-12 DIAGNOSIS — Z8589 Personal history of malignant neoplasm of other organs and systems: Secondary | ICD-10-CM | POA: Diagnosis not present

## 2023-09-12 DIAGNOSIS — I10 Essential (primary) hypertension: Secondary | ICD-10-CM | POA: Insufficient documentation

## 2023-09-12 DIAGNOSIS — K4090 Unilateral inguinal hernia, without obstruction or gangrene, not specified as recurrent: Secondary | ICD-10-CM | POA: Diagnosis not present

## 2023-09-12 DIAGNOSIS — Z7982 Long term (current) use of aspirin: Secondary | ICD-10-CM | POA: Insufficient documentation

## 2023-09-12 DIAGNOSIS — R109 Unspecified abdominal pain: Secondary | ICD-10-CM

## 2023-09-12 DIAGNOSIS — K8020 Calculus of gallbladder without cholecystitis without obstruction: Secondary | ICD-10-CM | POA: Diagnosis not present

## 2023-09-12 DIAGNOSIS — K810 Acute cholecystitis: Secondary | ICD-10-CM | POA: Diagnosis not present

## 2023-09-12 DIAGNOSIS — Z950 Presence of cardiac pacemaker: Secondary | ICD-10-CM | POA: Diagnosis not present

## 2023-09-12 DIAGNOSIS — R071 Chest pain on breathing: Secondary | ICD-10-CM | POA: Diagnosis not present

## 2023-09-12 DIAGNOSIS — K819 Cholecystitis, unspecified: Principal | ICD-10-CM

## 2023-09-12 DIAGNOSIS — I2510 Atherosclerotic heart disease of native coronary artery without angina pectoris: Secondary | ICD-10-CM | POA: Diagnosis not present

## 2023-09-12 DIAGNOSIS — I7121 Aneurysm of the ascending aorta, without rupture: Secondary | ICD-10-CM | POA: Diagnosis not present

## 2023-09-12 DIAGNOSIS — K7689 Other specified diseases of liver: Secondary | ICD-10-CM | POA: Diagnosis not present

## 2023-09-12 LAB — CBC
HCT: 41.4 % (ref 39.0–52.0)
Hemoglobin: 14 g/dL (ref 13.0–17.0)
MCH: 29.5 pg (ref 26.0–34.0)
MCHC: 33.8 g/dL (ref 30.0–36.0)
MCV: 87.3 fL (ref 80.0–100.0)
Platelets: 152 10*3/uL (ref 150–400)
RBC: 4.74 MIL/uL (ref 4.22–5.81)
RDW: 15 % (ref 11.5–15.5)
WBC: 8 10*3/uL (ref 4.0–10.5)
nRBC: 0 % (ref 0.0–0.2)

## 2023-09-12 LAB — COMPREHENSIVE METABOLIC PANEL
ALT: 11 U/L (ref 0–44)
AST: 15 U/L (ref 15–41)
Albumin: 4.1 g/dL (ref 3.5–5.0)
Alkaline Phosphatase: 76 U/L (ref 38–126)
Anion gap: 11 (ref 5–15)
BUN: 27 mg/dL — ABNORMAL HIGH (ref 8–23)
CO2: 21 mmol/L — ABNORMAL LOW (ref 22–32)
Calcium: 9.6 mg/dL (ref 8.9–10.3)
Chloride: 104 mmol/L (ref 98–111)
Creatinine, Ser: 1.16 mg/dL (ref 0.61–1.24)
GFR, Estimated: >60 mL/min (ref >60)
Glucose, Bld: 93 mg/dL (ref 70–99)
Potassium: 4 mmol/L (ref 3.5–5.1)
Sodium: 136 mmol/L (ref 135–145)
Total Bilirubin: 1.1 mg/dL (ref 0.3–1.2)
Total Protein: 7.5 g/dL (ref 6.5–8.1)

## 2023-09-12 LAB — LACTIC ACID, PLASMA: Lactic Acid, Venous: 1.2 mmol/L (ref 0.5–1.9)

## 2023-09-12 LAB — URINALYSIS, ROUTINE W REFLEX MICROSCOPIC
Bilirubin Urine: NEGATIVE
Glucose, UA: NEGATIVE mg/dL
Hgb urine dipstick: NEGATIVE
Ketones, ur: NEGATIVE mg/dL
Leukocytes,Ua: NEGATIVE
Nitrite: NEGATIVE
Protein, ur: NEGATIVE mg/dL
Specific Gravity, Urine: 1.018 (ref 1.005–1.030)
pH: 5 (ref 5.0–8.0)

## 2023-09-12 LAB — LIPASE, BLOOD: Lipase: 12 U/L (ref 11–51)

## 2023-09-12 MED ORDER — PIPERACILLIN-TAZOBACTAM 3.375 G IVPB 30 MIN
3.3750 g | Freq: Once | INTRAVENOUS | Status: AC
  Administered 2023-09-12: 3.375 g via INTRAVENOUS
  Filled 2023-09-12: qty 50

## 2023-09-12 MED ORDER — IOHEXOL 350 MG/ML SOLN
100.0000 mL | Freq: Once | INTRAVENOUS | Status: AC | PRN
  Administered 2023-09-12: 100 mL via INTRAVENOUS

## 2023-09-12 NOTE — ED Notes (Signed)
Pt aware of the need for a urine... Unable to currently provide the sample... 

## 2023-09-12 NOTE — ED Triage Notes (Signed)
Pt presents with RUQ abd pain that started yesterday. Denies N/V/D. Pt was seen a UC yesterday and referred to the ED to r/o cholecystitis.

## 2023-09-12 NOTE — Progress Notes (Signed)
ED Pharmacy Antibiotic Sign Off An antibiotic consult was received from an ED provider for zosyn per pharmacy dosing for intra-abdominal infection. A chart review was completed to assess appropriateness.   The following one time order(s) were placed:  Zosyn 3.375g IV x 1  Further antibiotic and/or antibiotic pharmacy consults should be ordered by the admitting provider if indicated.   Thank you for allowing pharmacy to be a part of this patient's care.   Daylene Posey, [facility]  Clinical Pharmacist 09/12/23 10:58 PM

## 2023-09-12 NOTE — Plan of Care (Signed)
Plan of Care Note for accepted transfer   Patient name: Gary [name] ZOX:[id number] DOB: [birth year]  Facility requesting transfer: [facility] Requesting Provider: Dr. Dalene Seltzer Facility course: 89 year old male with history of ascending aortic aneurysm, asystole x 2 status post pacemaker, hypertension, hyperlipidemia, metastatic pancreatic cancer presented to ED with right upper quadrant abdominal pain.  Slightly bradycardic but vital signs otherwise stable.  Labs showing no leukocytosis, lipase and LFTs normal, UA not suggestive of infection, lactic acid normal.  CTA chest and CT abdomen pelvis with contrast showing: IMPRESSION: 1. No evidence of significant pulmonary embolus. 2. 5 cm diameter ascending thoracic aortic aneurysm. Recommend semi-annual imaging followup by CTA or MRA and referral to cardiothoracic surgery if not already obtained. This recommendation follows 2010 ACCF/AHA/AATS/ACR/ASA/SCA/SCAI/SIR/STS/SVM Guidelines for the Diagnosis and Management of Patients With Thoracic Aortic Disease. Circulation. 2010; 121: W119-J478. Aortic aneurysm NOS (ICD10-I71.9) 3. Cholelithiasis with gallbladder wall thickening and edema, possibly acute cholecystitis. Intrahepatic bile duct dilatation with some suggestion of stone or sludge in the common bile duct. Consider follow-up with ERCP or MRCP as clinically indicated. 4. Hepatic cirrhosis with multiple focal liver lesions, likely metastatic. No change. Small amount of upper abdominal ascites. 5. Large right inguinal hernia containing small bowel with evidence of fat necrosis but without proximal obstruction. For 6 large esophageal hiatal hernia. 6. Aortic atherosclerosis.  Right upper quadrant ultrasound showing: IMPRESSION: 1. Cholelithiasis with slight gallbladder wall thickening but negative sonographic Murphy. 2. Heterogenous liver with surface nodularity suggesting cirrhosis or pseudocirrhosis. Vague hypoechoic liver  lesions corresponding to history of metastatic disease.  Patient was given Zosyn.  EDP discussed the case with Dr. Freida Busman with general surgery, not sure if patient is a good surgical candidate.  General surgery service will consult.  Plan of care: The patient is accepted for admission to Telemetry unit at [facility].  [facility] will assume care on arrival to accepting facility. Until arrival, care as per EDP. However, [facility] available 24/7 for questions and assistance.  Author: John Giovanni, MD 07/21/2023  Check [email or web address] for on-call coverage.  Nursing staff, please call [facility] Admits & Consults System-Wide number under Amion on patient's arrival so appropriate admitting provider can evaluate the pt.

## 2023-09-12 NOTE — ED Provider Notes (Signed)
[place] EMERGENCY DEPARTMENT AT [facility] Provider Note   CSN: [id number] Arrival date & time: 09/12/23  1544     History {Add pertinent medical, surgical, social history, OB history to HPI:1} Chief Complaint  Patient presents with   Abdominal Pain    Gary [name] is a 89 y.o. male.  HPI     Pleasant 89 year old male with a history of hypertension, hyperlipidemia, malignant ascites, pancreatic cancer with metastases to the liver, ascending aortic aneurysm, pacemaker in place      Sees Dr. Elnita Maxwell  Past Medical History:  Diagnosis Date   Ascending aortic aneurysm (HCC) 01/13/2014   stable at 4.7 cm diameter since 2009   Asystole x2, s/p pacemaker 01/13/2014   Bradycardia    permanent pacemaker 11/22/08 [employer]ude   Family history of adverse reaction to anesthesia    brother died after anesthesia / nose polyp removal at [facility] (40 years ago)   HTN (hypertension) 01/13/2014   Hyperlipidemia    Hypertension    Malignant ascites    Pacemaker - leads 1987, last generator 2009 [employer] dual-chamber 01/13/2014   Atrial lead model 433-01, ventricular lead model 431-01 implanted November 1987 Right ventricular lead dysfunction with low impedance and mediocre sensing but satisfactory pacing threshold   Pacemaker lead malfunction 01/13/2014   Chronic low impedance and poor sensing of the ventricular lead   Pancreatic cancer (HCC)    Metastatic to liver     Home Medications Prior to Admission medications   Medication Sig Start Date End Date Taking? Authorizing Provider  acetaminophen (TYLENOL) 325 MG tablet 1 tablet as needed    [provider]  furosemide (LASIX) 20 MG tablet Take 1 tablet (20 mg total) by mouth daily as needed. 12/11/22   Croitoru, Mihai, MD  lidocaine-prilocaine (EMLA) cream Apply 1 application topically as directed. Apply 1 hour prior to IV stick and cover with plastic wrap 01/22/22   Rana Snare, NP  omeprazole (PRILOSEC) 20 MG capsule Take  1 capsule by mouth daily. 01/22/21   [provider]  spironolactone (ALDACTONE) 25 MG tablet TAKE 1 TABLET(25 MG) BY MOUTH EVERY OTHER DAY 03/12/23   Iva Boop, MD      Allergies    Amlodipine, Gentamycin [gentamicin sulfate], Hydrocodone, and Aspirin    Review of Systems   Review of Systems  Physical Exam Updated Vital Signs BP 139/75 (BP Location: Left Arm)   Pulse 64   Temp 98.1 F (36.7 C)   Resp 14   Ht 5\' 5"  (1.651 m)   Wt 69.9 kg   SpO2 99%   BMI 25.63 kg/m  Physical Exam  ED Results / Procedures / Treatments   Labs (all labs ordered are listed, but only abnormal results are displayed) Labs Reviewed  COMPREHENSIVE METABOLIC PANEL - Abnormal; Notable for the following components:      Result Value   CO2 21 (*)    BUN 27 (*)    All other components within normal limits  LIPASE, BLOOD  CBC  URINALYSIS, ROUTINE W REFLEX MICROSCOPIC    EKG None  Radiology No results found.  Procedures Procedures  {Document cardiac monitor, telemetry assessment procedure when appropriate:1}  Medications Ordered in ED Medications - No data to display  ED Course/ Medical Decision Making/ A&P   {   Click here for ABCD2, HEART and other calculatorsREFRESH Note before signing :1}  Medical Decision Making Amount and/or Complexity of Data Reviewed Labs: ordered.   ***  {Document critical care time when appropriate:1} {Document review of labs and clinical decision tools ie heart score, Chads2Vasc2 etc:1}  {Document your independent review of radiology images, and any outside records:1} {Document your discussion with family members, caretakers, and with consultants:1} {Document social determinants of health affecting pt's care:1} {Document your decision making why or why not admission, treatments were needed:1} Final Clinical Impression(s) / ED Diagnoses Final diagnoses:  None    Rx / DC Orders ED Discharge Orders     None

## 2023-09-12 NOTE — ED Notes (Signed)
ED Provider at bedside. 

## 2023-09-13 DIAGNOSIS — R933 Abnormal findings on diagnostic imaging of other parts of digestive tract: Secondary | ICD-10-CM | POA: Diagnosis not present

## 2023-09-13 DIAGNOSIS — K810 Acute cholecystitis: Secondary | ICD-10-CM | POA: Diagnosis present

## 2023-09-13 DIAGNOSIS — R1011 Right upper quadrant pain: Secondary | ICD-10-CM | POA: Diagnosis not present

## 2023-09-13 DIAGNOSIS — K8020 Calculus of gallbladder without cholecystitis without obstruction: Secondary | ICD-10-CM | POA: Diagnosis not present

## 2023-09-13 DIAGNOSIS — C252 Malignant neoplasm of tail of pancreas: Secondary | ICD-10-CM | POA: Diagnosis not present

## 2023-09-13 LAB — GLUCOSE, CAPILLARY: Glucose-Capillary: 103 mg/dL — ABNORMAL HIGH (ref 70–99)

## 2023-09-13 MED ORDER — ONDANSETRON HCL 4 MG/2ML IJ SOLN: 4.0000 mg | Freq: Four times a day (QID) | INTRAMUSCULAR | Status: DC | PRN

## 2023-09-13 MED ORDER — ENOXAPARIN SODIUM 40 MG/0.4ML IJ SOSY
40.0000 mg | PREFILLED_SYRINGE | INTRAMUSCULAR | Status: DC
  Administered 2023-09-13: 40 mg via SUBCUTANEOUS
  Filled 2023-09-13: qty 0.4

## 2023-09-13 MED ORDER — PIPERACILLIN-TAZOBACTAM 3.375 G IVPB
3.3750 g | Freq: Three times a day (TID) | INTRAVENOUS | Status: DC
  Administered 2023-09-13: 3.375 g via INTRAVENOUS
  Filled 2023-09-13: qty 50

## 2023-09-13 MED ORDER — DEXTROSE IN LACTATED RINGERS 5 % IV SOLN: INTRAVENOUS | Status: DC

## 2023-09-13 MED ORDER — PANTOPRAZOLE SODIUM 40 MG PO TBEC
40.0000 mg | DELAYED_RELEASE_TABLET | Freq: Every day | ORAL | Status: DC
  Administered 2023-09-13: 40 mg via ORAL
  Filled 2023-09-13: qty 1

## 2023-09-13 MED ORDER — MORPHINE SULFATE (PF) 2 MG/ML IV SOLN: 1.0000 mg | INTRAVENOUS | Status: DC | PRN

## 2023-09-13 MED ORDER — ACETAMINOPHEN 325 MG PO TABS: 650.0000 mg | ORAL_TABLET | Freq: Four times a day (QID) | ORAL | Status: DC | PRN

## 2023-09-13 NOTE — Progress Notes (Signed)
Assessment unchanged. Pt and wife verbalized understanding of dc instructions through teach back including medications to resume. Discharged via wc to front entrance accompanied by NT and family.

## 2023-09-13 NOTE — Care Management CC44 (Signed)
Condition Code 44 Documentation Completed  Patient Details  Name: ARAMIS WEIL MRN: [id number] Date of Birth: 1932/01/21   Condition Code 44 given:  Yes Patient signature on Condition Code 44 notice:  Yes Documentation of 2 MD's agreement:  Yes Code 44 added to claim:  Yes    Adrian Prows, RN 09/13/2023, 2:34 PM

## 2023-09-13 NOTE — Discharge Summary (Signed)
Discharge Summary  Gary [name] KVQ:[id number] DOB: [birth year]  PCP: Lupita Raider, MD  Admit date: 09/12/2023 Discharge date: 09/13/2023   Recommendations for Outpatient Follow-up:  Please follow up with your PCP with CBC and BMP in 1-2 weeks.  Discuss abdominal aortic aneurysm, which has grown in size from 4.7 cm to 5.0 cm.  Could consider surgical consultation, though this may be of limited utility.  Consider biannual surveillance. Please follow-up with your gastroenterologist Dr. Leone Payor if symptoms recur.  Discharge Diagnoses:  Active Hospital Problems   Diagnosis Date Noted   Acute cholecystitis 09/13/2023    Resolved Hospital Problems  No resolved problems to display.   Discharge Condition: Stable   Diet recommendation: Diet Orders (From admission, onward)     Start     Ordered   09/13/23 0514  Diet NPO time specified Except for: Ice Chips, Sips with Meds  Diet effective now       Question Answer Comment  Except for Ice Chips   Except for Sips with Meds      09/13/23 0513           HPI and Brief Hospital Course:  Gary [name] is a 89 y.o. 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  She had right sided abdominal pain, though his lab work is normal and despite imaging evidence of possible acute cholecystitis his abdominal pain has resolved and his abdominal exam is benign.  Patient was seen in consultation by general surgery, who recommended gastroenterology consultation and consideration of possible ERCP.  He was seen this afternoon by gastroenterologist Dr. Loreta Ave, who recommends that the patient can be discharged home and needs no further workup at this time.  Patient can follow-up with his gastroenterologist Dr. Leone Payor, and if symptoms recur may benefit from endoscopic  ultrasound.  Consultations: General Surgery Dr. Carolynne Edouard Gastroenterology Dr. Loreta Ave  Discharge details, plan of care and follow up instructions were discussed with patient and any available family or care providers. Patient and family are in agreement with discharge from the hospital today and all questions were answered to their satisfaction.  Discharge Exam: BP 100/68 (BP Location: Right Arm)   Pulse (!) 51   Temp 97.7 F (36.5 C) (Oral)   Resp 18   Ht 5\' 5"  (1.651 m)   Wt 69.9 kg   SpO2 98%   BMI 25.63 kg/m  Please see admission history and physical from this date.  Discharge Instructions You were cared for by a hospitalist during your hospital stay. If you have any questions about your discharge medications or the care you received while you were in the hospital after you are discharged, you can call the unit and asked to speak with the hospitalist on call if the hospitalist that took care of you is not available. Once you are discharged, your primary care physician will handle any further medical issues. Please note that NO REFILLS for any discharge medications will be authorized once you are discharged, as it is imperative that you return to your primary care physician (or establish a relationship with a primary care physician if you do not have one) for your aftercare needs so that they can reassess your need for medications and monitor your lab values.   Allergies as of 09/13/2023       Reactions   Amlodipine Other (See Comments)   Gentamycin [gentamicin  Sulfate]    "Inner ears problems after Gent infusion" , also "unsteady mobility"   Hydrocodone    Other reaction(s): unknown   Aspirin Other (See Comments)   High doses cause stomach upset        Medication List     TAKE these medications    acetaminophen 500 MG tablet Commonly known as: TYLENOL Take 500-1,000 mg by mouth every 6 (six) hours as needed for moderate pain.   omeprazole 20 MG capsule Commonly known as:  PRILOSEC Take 20 mg by mouth daily.   spironolactone 25 MG tablet Commonly known as: ALDACTONE TAKE 1 TABLET(25 MG) BY MOUTH EVERY OTHER DAY       Allergies  Allergen Reactions   Amlodipine Other (See Comments)   Gentamycin [Gentamicin Sulfate]     "Inner ears problems after Gent infusion" , also "unsteady mobility"   Hydrocodone     Other reaction(s): unknown   Aspirin Other (See Comments)    High doses cause stomach upset     The results of significant diagnostics from this hospitalization (including imaging, microbiology, ancillary and laboratory) are listed below for reference.    Significant Diagnostic Studies: CT Angio Chest PE W and/or Wo Contrast  Result Date: 09/12/2023 CLINICAL DATA:  Pulmonary embolus suspected with high probability. Right upper quadrant abdominal pain starting yesterday. Right lower quadrant and right upper quadrant and right lower chest pain worse with deep breathing. Pain started yesterday. Patient was referred to ED to rule out cholecystitis. EXAM: CT ANGIOGRAPHY CHEST CT ABDOMEN AND PELVIS WITH CONTRAST TECHNIQUE: Multidetector CT imaging of the chest was performed using the standard protocol during bolus administration of intravenous contrast. Multiplanar CT image reconstructions and MIPs were obtained to evaluate the vascular anatomy. Multidetector CT imaging of the abdomen and pelvis was performed using the standard protocol during bolus administration of intravenous contrast. RADIATION DOSE REDUCTION: This exam was performed according to the departmental dose-optimization program which includes automated exposure control, adjustment of the mA and/or kV according to patient size and/or use of iterative reconstruction technique. CONTRAST:  OMNIPAQUE IOHEXOL 350 MG/ML SOLN COMPARISON:  CT chest 10/19/2021.  CT abdomen and pelvis 08/31/2021 FINDINGS: CTA CHEST FINDINGS Cardiovascular: There is good opacification of the central and segmental pulmonary  arteries. No focal filling defects. No evidence of significant pulmonary embolus. Normal heart size. No pericardial effusions. Ascending thoracic aortic aneurysm measuring 5 cm diameter. Scattered aortic calcification. Calcification of the coronary arteries. Mediastinum/Nodes: Thyroid gland is unremarkable. Esophagus is decompressed. Large esophageal hiatal hernia. No significant lymphadenopathy. Left central venous catheter with Infusaport. Lungs/Pleura: Lungs are clear. No pleural effusions. No pneumothorax. Musculoskeletal: Degenerative changes in the spine. No acute bony abnormalities. Review of the MIP images confirms the above findings. CT ABDOMEN and PELVIS FINDINGS Hepatobiliary: Multiple low-attenuation lesions demonstrated throughout the liver with peripheral enhancement in some lesions. These are likely metastatic. Similar appearance to previous study. Probable hepatic cirrhosis with enlarged lateral segment left lobe of the liver. Mild intrahepatic bile duct dilatation. Increased density in the distal common bile duct could represent common duct stone or sludge. Cholelithiasis with gallbladder wall thickening and pericholecystic edema possibly indicating cholecystitis. Pancreas: Unremarkable. No pancreatic ductal dilatation or surrounding inflammatory changes. Spleen: Normal in size without focal abnormality. Adrenals/Urinary Tract: No adrenal gland nodules. Kidneys are symmetrical. No hydronephrosis or hydroureter. No focal mass lesions. Gallbladder is unremarkable. Stomach/Bowel: Stomach, small bowel, and colon are not abnormally distended. No wall thickening or inflammatory changes. Scattered stool in the colon. Appendix  is normal. There is a large right inguinal hernia containing small bowel loops with fatty infiltration suggesting fat necrosis. No proximal obstruction or bowel wall thickening. Vascular/Lymphatic: Aortic atherosclerosis. No enlarged abdominal or pelvic lymph nodes. Reproductive:  Prostate is unremarkable. Other: Small amount of free fluid in the upper abdomen, likely ascites. No free air. Abdominal wall musculature appears intact. Musculoskeletal: Degenerative changes in the spine. Old compression deformity of L2 without change. No focal bone lesions. Review of the MIP images confirms the above findings. IMPRESSION: 1. No evidence of significant pulmonary embolus. 2. 5 cm diameter ascending thoracic aortic aneurysm. Recommend semi-annual imaging followup by CTA or MRA and referral to cardiothoracic surgery if not already obtained. This recommendation follows 2010 ACCF/AHA/AATS/ACR/ASA/SCA/SCAI/SIR/STS/SVM Guidelines for the Diagnosis and Management of Patients With Thoracic Aortic Disease. Circulation. 2010; 121: J191-Y782. Aortic aneurysm NOS (ICD10-I71.9) 3. Cholelithiasis with gallbladder wall thickening and edema, possibly acute cholecystitis. Intrahepatic bile duct dilatation with some suggestion of stone or sludge in the common bile duct. Consider follow-up with ERCP or MRCP as clinically indicated. 4. Hepatic cirrhosis with multiple focal liver lesions, likely metastatic. No change. Small amount of upper abdominal ascites. 5. Large right inguinal hernia containing small bowel with evidence of fat necrosis but without proximal obstruction. For 6 large esophageal hiatal hernia. 6. Aortic atherosclerosis. Electronically Signed   By: Burman Nieves M.D.   On: 09/12/2023 21:52   CT ABDOMEN PELVIS W CONTRAST  Result Date: 09/12/2023 CLINICAL DATA:  Pulmonary embolus suspected with high probability. Right upper quadrant abdominal pain starting yesterday. Right lower quadrant and right upper quadrant and right lower chest pain worse with deep breathing. Pain started yesterday. Patient was referred to ED to rule out cholecystitis. EXAM: CT ANGIOGRAPHY CHEST CT ABDOMEN AND PELVIS WITH CONTRAST TECHNIQUE: Multidetector CT imaging of the chest was performed using the standard protocol  during bolus administration of intravenous contrast. Multiplanar CT image reconstructions and MIPs were obtained to evaluate the vascular anatomy. Multidetector CT imaging of the abdomen and pelvis was performed using the standard protocol during bolus administration of intravenous contrast. RADIATION DOSE REDUCTION: This exam was performed according to the departmental dose-optimization program which includes automated exposure control, adjustment of the mA and/or kV according to patient size and/or use of iterative reconstruction technique. CONTRAST:  OMNIPAQUE IOHEXOL 350 MG/ML SOLN COMPARISON:  CT chest 10/19/2021.  CT abdomen and pelvis 08/31/2021 FINDINGS: CTA CHEST FINDINGS Cardiovascular: There is good opacification of the central and segmental pulmonary arteries. No focal filling defects. No evidence of significant pulmonary embolus. Normal heart size. No pericardial effusions. Ascending thoracic aortic aneurysm measuring 5 cm diameter. Scattered aortic calcification. Calcification of the coronary arteries. Mediastinum/Nodes: Thyroid gland is unremarkable. Esophagus is decompressed. Large esophageal hiatal hernia. No significant lymphadenopathy. Left central venous catheter with Infusaport. Lungs/Pleura: Lungs are clear. No pleural effusions. No pneumothorax. Musculoskeletal: Degenerative changes in the spine. No acute bony abnormalities. Review of the MIP images confirms the above findings. CT ABDOMEN and PELVIS FINDINGS Hepatobiliary: Multiple low-attenuation lesions demonstrated throughout the liver with peripheral enhancement in some lesions. These are likely metastatic. Similar appearance to previous study. Probable hepatic cirrhosis with enlarged lateral segment left lobe of the liver. Mild intrahepatic bile duct dilatation. Increased density in the distal common bile duct could represent common duct stone or sludge. Cholelithiasis with gallbladder wall thickening and pericholecystic edema  possibly indicating cholecystitis. Pancreas: Unremarkable. No pancreatic ductal dilatation or surrounding inflammatory changes. Spleen: Normal in size without focal abnormality. Adrenals/Urinary Tract: No adrenal  gland nodules. Kidneys are symmetrical. No hydronephrosis or hydroureter. No focal mass lesions. Gallbladder is unremarkable. Stomach/Bowel: Stomach, small bowel, and colon are not abnormally distended. No wall thickening or inflammatory changes. Scattered stool in the colon. Appendix is normal. There is a large right inguinal hernia containing small bowel loops with fatty infiltration suggesting fat necrosis. No proximal obstruction or bowel wall thickening. Vascular/Lymphatic: Aortic atherosclerosis. No enlarged abdominal or pelvic lymph nodes. Reproductive: Prostate is unremarkable. Other: Small amount of free fluid in the upper abdomen, likely ascites. No free air. Abdominal wall musculature appears intact. Musculoskeletal: Degenerative changes in the spine. Old compression deformity of L2 without change. No focal bone lesions. Review of the MIP images confirms the above findings. IMPRESSION: 1. No evidence of significant pulmonary embolus. 2. 5 cm diameter ascending thoracic aortic aneurysm. Recommend semi-annual imaging followup by CTA or MRA and referral to cardiothoracic surgery if not already obtained. This recommendation follows 2010 ACCF/AHA/AATS/ACR/ASA/SCA/SCAI/SIR/STS/SVM Guidelines for the Diagnosis and Management of Patients With Thoracic Aortic Disease. Circulation. 2010; 121: W413-K440. Aortic aneurysm NOS (ICD10-I71.9) 3. Cholelithiasis with gallbladder wall thickening and edema, possibly acute cholecystitis. Intrahepatic bile duct dilatation with some suggestion of stone or sludge in the common bile duct. Consider follow-up with ERCP or MRCP as clinically indicated. 4. Hepatic cirrhosis with multiple focal liver lesions, likely metastatic. No change. Small amount of upper abdominal  ascites. 5. Large right inguinal hernia containing small bowel with evidence of fat necrosis but without proximal obstruction. For 6 large esophageal hiatal hernia. 6. Aortic atherosclerosis. Electronically Signed   By: Burman Nieves M.D.   On: 09/12/2023 21:52   US Abdomen Limited RUQ (LIVER/GB)  Result Date: 09/12/2023 CLINICAL DATA:  Right upper quadrant pain history of liver Mets EXAM: ULTRASOUND ABDOMEN LIMITED RIGHT UPPER QUADRANT COMPARISON:  CT 01/04/2022 FINDINGS: Gallbladder: Shadowing stone. Slight wall thickening. Negative sonographic Murphy Common bile duct: Diameter: 3 mm Liver: Heterogenous hepatic echotexture and surface nodularity suggesting cirrhosis. Vague hypoechoic liver lesions corresponding to history of metastatic disease. Portal vein is patent on color Doppler imaging with normal direction of blood flow towards the liver. Other: None. IMPRESSION: 1. Cholelithiasis with slight gallbladder wall thickening but negative sonographic Murphy. 2. Heterogenous liver with surface nodularity suggesting cirrhosis or pseudocirrhosis. Vague hypoechoic liver lesions corresponding to history of metastatic disease. Electronically Signed   By: Jasmine Pang M.D.   On: 09/12/2023 20:52    Microbiology: No results found for this or any previous visit (from the past 240 hour(s)).   Labs: Basic Metabolic Panel: Recent Labs  Lab 09/12/23 1618  NA 136  K 4.0  CL 104  CO2 21*  GLUCOSE 93  BUN 27*  CREATININE 1.16  CALCIUM 9.6   Liver Function Tests: Recent Labs  Lab 09/12/23 1618  AST 15  ALT 11  ALKPHOS 76  BILITOT 1.1  PROT 7.5  ALBUMIN 4.1   Recent Labs  Lab 09/12/23 1618  LIPASE 12   No results for input(s): "AMMONIA" in the last 168 hours. CBC: Recent Labs  Lab 09/12/23 1618  WBC 8.0  HGB 14.0  HCT 41.4  MCV 87.3  PLT 152   Cardiac Enzymes: No results for input(s): "CKTOTAL", "CKMB", "CKMBINDEX", "TROPONINI" in the last 168 hours. BNP: BNP (last 3  results) No results for input(s): "BNP" in the last 8760 hours.  ProBNP (last 3 results) No results for input(s): "PROBNP" in the last 8760 hours.  CBG: Recent Labs  Lab 09/13/23 1129  GLUCAP 103*  Time spent: > 30 minutes were spent in preparing this discharge including medication reconciliation, counseling, and coordination of care.  Signed:  Erisha Paugh Sharlette Dense, MD  [facility] 09/13/2023, 1:45 PM

## 2023-09-13 NOTE — ED Notes (Signed)
Awaiting [facility] for patient transfer.

## 2023-09-13 NOTE — Progress Notes (Signed)
Mobility Specialist - Progress Note   09/13/23 1021  Mobility  Activity Ambulated with assistance in hallway  Level of Assistance Standby assist, set-up cues, supervision of patient - no hands on  Assistive Device Front wheel walker  Distance Ambulated (ft) 265 ft  Activity Response Tolerated well  Mobility Referral Yes  $Mobility charge 1 Mobility  Mobility Specialist Start Time (ACUTE ONLY) 0930  Mobility Specialist Stop Time (ACUTE ONLY) 0944  Mobility Specialist Time Calculation (min) (ACUTE ONLY) 14 min   Pt received EOB and agreeable to mobility. No complaints during session. Pt to EOB after session with all needs met.    [facility]

## 2023-09-13 NOTE — Consult Note (Signed)
CROSS COVER [facility] Reason for Consult:/Choledocholithiasis-needs ERCP.  Referring Physician: THP  Gary [name] is an 89 y.o. male.  HPI: Patient is a 89 year old [place] male with multiple medical problems listed below who was in his usual state of health till about dinnertime last night when developed worsening of his right upper quadrant pain prompting his family to bring him to the emergency room.  Been having some right upper quadrant discomfort the last couple of days.  He denies have any nausea vomiting fever chills or rigors.  The pain is completely resolved now.  On evaluation in the ER he was noted to be slightly bradycardic but was asymptomatic.  Lactic acid level was normal. Right upper quadrant ultrasound revealed cholelithiasis with gallbladder thickening but no evidence of acute cholecystitis the liver was heterogeneous with nodularity of the surface suggesting cirrhosis or pseudocirrhosis with vague hypoechoic lesions in the liver corresponding to metastatic disease.  CT of the abdomen pelvis revealed hepatic cirrhosis multiple focal lesions consistent with metastatic disease and small amount of ascites with a large right inguinal hernia containing small bowel; a 6 cm hiatal hernia was also noted intrahepatic ducts were mildly dilated with questionable shadowing the distal CBD suspicious for CBD stone. His LFT's are normal.  Past Medical History:  Diagnosis Date   Ascending aortic aneurysm (HCC) 01/13/2014   stable at 4.7 cm diameter since 2009   Asystole x2, s/p pacemaker 01/13/2014   Bradycardia    permanent pacemaker 11/22/08 [employer].Jude   Family history of adverse reaction to anesthesia    brother died after anesthesia / nose polyp removal at [facility] (40 years ago)   HTN (hypertension) 01/13/2014   Hyperlipidemia    Hypertension    Malignant ascites    Pacemaker - leads 1987, last generator 2009 [employer] Jude dual-chamber 01/13/2014   Atrial lead model 433-01, ventricular lead model 431-01  implanted November 1987 Right ventricular lead dysfunction with low impedance and mediocre sensing but satisfactory pacing threshold   Pacemaker lead malfunction 01/13/2014   Chronic low impedance and poor sensing of the ventricular lead   Pancreatic cancer (HCC)    Metastatic to liver   Past Surgical History:  Procedure Laterality Date   IR IMAGING GUIDED PORT INSERTION  05/24/2020   IR PARACENTESIS  10/26/2020   IR PARACENTESIS  12/12/2020   IR PARACENTESIS  01/22/2021   IR PARACENTESIS  02/02/2021   IR PARACENTESIS  02/14/2021   IR PARACENTESIS  02/23/2021   IR PARACENTESIS  03/06/2021   IR PARACENTESIS  03/14/2021   IR PARACENTESIS  03/22/2021   IR PARACENTESIS  03/29/2021   IR PARACENTESIS  04/06/2021   IR PARACENTESIS  04/17/2021   IR PARACENTESIS  04/27/2021   IR PARACENTESIS  05/09/2021   IR PARACENTESIS  05/22/2021   IR PARACENTESIS  09/07/2021   PERMANENT PACEMAKER GENERATOR CHANGE  11/22/08   [employer] Jude   PERMANENT PACEMAKER INSERTION  03/04/97   [employer] Jude   US ECHOCARDIOGRAPHY  05/16/11   AS mild to mod.,TR mild to mod., EF => 55%.   Family History  Problem Relation Age of Onset   Ovarian cancer Mother    Heart attack Father    Social History:  reports that he has never smoked. He has never used smokeless tobacco. He reports current alcohol use. He reports that he does not use drugs.  Allergies:  Allergies  Allergen Reactions   Amlodipine Other (See Comments)   Gentamycin [Gentamicin Sulfate]     "Inner ears problems  after Gent infusion" , also "unsteady mobility"   Hydrocodone     Other reaction(s): unknown   Aspirin Other (See Comments)    High doses cause stomach upset   Medications: I have reviewed the patient's current medications. Prior to Admission:  Medications Prior to Admission  Medication Sig Dispense Refill Last Dose   acetaminophen (TYLENOL) 500 MG tablet Take 500-1,000 mg by mouth every 6 (six) hours as needed for moderate pain.   09/12/2023   omeprazole (PRILOSEC)  20 MG capsule Take 20 mg by mouth daily.   09/12/2023   spironolactone (ALDACTONE) 25 MG tablet TAKE 1 TABLET(25 MG) BY MOUTH EVERY OTHER DAY 45 tablet 1 Past Week   Scheduled:  enoxaparin (LOVENOX) injection  40 mg Subcutaneous Q24H   pantoprazole  40 mg Oral Daily   Continuous:  dextrose 5% lactated ringers 75 mL/hr at 09/13/23 0549   piperacillin-tazobactam (ZOSYN)  IV 3.375 g (09/13/23 0659)   OZH:YQMVHQIONGEXB, morphine injection, ondansetron (ZOFRAN) IV  Results for orders placed or performed during the hospital encounter of 09/12/23 (from the past 48 hour(s))  Lipase, blood     Status: None   Collection Time: 09/12/23  4:18 PM  Result Value Ref Range   Lipase 12 11 - 51 U/L    Comment: Performed at [facility], [address] [place], [place] [address]  Comprehensive metabolic panel     Status: Abnormal   Collection Time: 09/12/23  4:18 PM  Result Value Ref Range   Sodium 136 135 - 145 mmol/L   Potassium 4.0 3.5 - 5.1 mmol/L   Chloride 104 98 - 111 mmol/L   CO2 21 (L) 22 - 32 mmol/L   Glucose, Bld 93 70 - 99 mg/dL    Comment: Glucose reference range applies only to samples taken after fasting for at least 8 hours.   BUN 27 (H) 8 - 23 mg/dL   Creatinine, Ser 2.44 0.61 - 1.24 mg/dL   Calcium 9.6 8.9 - 01.0 mg/dL   Total Protein 7.5 6.5 - 8.1 g/dL   Albumin 4.1 3.5 - 5.0 g/dL   AST 15 15 - 41 U/L   ALT 11 0 - 44 U/L   Alkaline Phosphatase 76 38 - 126 U/L   Total Bilirubin 1.1 0.3 - 1.2 mg/dL   GFR, Estimated >27 >25 mL/min    Comment: (NOTE) Calculated using the CKD-EPI Creatinine Equation (2021)    Anion gap 11 5 - 15    Comment: Performed at [facility], [address] [place], [place] [address]  CBC     Status: None   Collection Time: 09/12/23  4:18 PM  Result Value Ref Range   WBC 8.0 4.0 - 10.5 K/uL   RBC 4.74 4.22 - 5.81 MIL/uL   Hemoglobin 14.0 13.0 - 17.0 g/dL   HCT 03.4 74.2 - 59.5 %   MCV 87.3 80.0 - 100.0 fL    MCH 29.5 26.0 - 34.0 pg   MCHC 33.8 30.0 - 36.0 g/dL   RDW 63.8 75.6 - 43.3 %   Platelets 152 150 - 400 K/uL   nRBC 0.0 0.0 - 0.2 %    Comment: Performed at [facility], [address] [place], [place] [address]  Urinalysis, Routine w reflex microscopic -Urine, Clean Catch     Status: None   Collection Time: 09/12/23  8:41 PM  Result Value Ref Range   Color, Urine YELLOW YELLOW   APPearance CLEAR CLEAR   Specific Gravity, Urine  1.018 1.005 - 1.030   pH 5.0 5.0 - 8.0   Glucose, UA NEGATIVE NEGATIVE mg/dL   Hgb urine dipstick NEGATIVE NEGATIVE   Bilirubin Urine NEGATIVE NEGATIVE   Ketones, ur NEGATIVE NEGATIVE mg/dL   Protein, ur NEGATIVE NEGATIVE mg/dL   Nitrite NEGATIVE NEGATIVE   Leukocytes,Ua NEGATIVE NEGATIVE    Comment: Performed at [facility], [address] [place], [place] [address]  Lactic acid, plasma     Status: None   Collection Time: 09/12/23 11:10 PM  Result Value Ref Range   Lactic Acid, Venous 1.2 0.5 - 1.9 mmol/L    Comment: Performed at [facility], [address] [place], [place] [address]  Glucose, capillary     Status: Abnormal   Collection Time: 09/13/23 11:29 AM  Result Value Ref Range   Glucose-Capillary 103 (H) 70 - 99 mg/dL    Comment: Glucose reference range applies only to samples taken after fasting for at least 8 hours.    CT Angio Chest PE W and/or Wo Contrast  Result Date: 09/12/2023 CLINICAL DATA:  Pulmonary embolus suspected with high probability. Right upper quadrant abdominal pain starting yesterday. Right lower quadrant and right upper quadrant and right lower chest pain worse with deep breathing. Pain started yesterday. Patient was referred to ED to rule out cholecystitis. EXAM: CT ANGIOGRAPHY CHEST CT ABDOMEN AND PELVIS WITH CONTRAST TECHNIQUE: Multidetector CT imaging of the chest was performed using the standard protocol during bolus administration of intravenous contrast.  Multiplanar CT image reconstructions and MIPs were obtained to evaluate the vascular anatomy. Multidetector CT imaging of the abdomen and pelvis was performed using the standard protocol during bolus administration of intravenous contrast. RADIATION DOSE REDUCTION: This exam was performed according to the departmental dose-optimization program which includes automated exposure control, adjustment of the mA and/or kV according to patient size and/or use of iterative reconstruction technique. CONTRAST:  OMNIPAQUE IOHEXOL 350 MG/ML SOLN COMPARISON:  CT chest 10/19/2021.  CT abdomen and pelvis 08/31/2021 FINDINGS: CTA CHEST FINDINGS Cardiovascular: There is good opacification of the central and segmental pulmonary arteries. No focal filling defects. No evidence of significant pulmonary embolus. Normal heart size. No pericardial effusions. Ascending thoracic aortic aneurysm measuring 5 cm diameter. Scattered aortic calcification. Calcification of the coronary arteries. Mediastinum/Nodes: Thyroid gland is unremarkable. Esophagus is decompressed. Large esophageal hiatal hernia. No significant lymphadenopathy. Left central venous catheter with Infusaport. Lungs/Pleura: Lungs are clear. No pleural effusions. No pneumothorax. Musculoskeletal: Degenerative changes in the spine. No acute bony abnormalities. Review of the MIP images confirms the above findings. CT ABDOMEN and PELVIS FINDINGS Hepatobiliary: Multiple low-attenuation lesions demonstrated throughout the liver with peripheral enhancement in some lesions. These are likely metastatic. Similar appearance to previous study. Probable hepatic cirrhosis with enlarged lateral segment left lobe of the liver. Mild intrahepatic bile duct dilatation. Increased density in the distal common bile duct could represent common duct stone or sludge. Cholelithiasis with gallbladder wall thickening and pericholecystic edema possibly indicating cholecystitis. Pancreas: Unremarkable.  No pancreatic ductal dilatation or surrounding inflammatory changes. Spleen: Normal in size without focal abnormality. Adrenals/Urinary Tract: No adrenal gland nodules. Kidneys are symmetrical. No hydronephrosis or hydroureter. No focal mass lesions. Gallbladder is unremarkable. Stomach/Bowel: Stomach, small bowel, and colon are not abnormally distended. No wall thickening or inflammatory changes. Scattered stool in the colon. Appendix is normal. There is a large right inguinal hernia containing small bowel loops with fatty infiltration suggesting fat necrosis. No proximal obstruction or bowel wall thickening. Vascular/Lymphatic: Aortic atherosclerosis.  No enlarged abdominal or pelvic lymph nodes. Reproductive: Prostate is unremarkable. Other: Small amount of free fluid in the upper abdomen, likely ascites. No free air. Abdominal wall musculature appears intact. Musculoskeletal: Degenerative changes in the spine. Old compression deformity of L2 without change. No focal bone lesions. Review of the MIP images confirms the above findings. IMPRESSION: 1. No evidence of significant pulmonary embolus. 2. 5 cm diameter ascending thoracic aortic aneurysm. Recommend semi-annual imaging followup by CTA or MRA and referral to cardiothoracic surgery if not already obtained. This recommendation follows 2010 ACCF/AHA/AATS/ACR/ASA/SCA/SCAI/SIR/STS/SVM Guidelines for the Diagnosis and Management of Patients With Thoracic Aortic Disease. Circulation. 2010; 121: Z610-R604. Aortic aneurysm NOS (ICD10-I71.9) 3. Cholelithiasis with gallbladder wall thickening and edema, possibly acute cholecystitis. Intrahepatic bile duct dilatation with some suggestion of stone or sludge in the common bile duct. Consider follow-up with ERCP or MRCP as clinically indicated. 4. Hepatic cirrhosis with multiple focal liver lesions, likely metastatic. No change. Small amount of upper abdominal ascites. 5. Large right inguinal hernia containing small bowel  with evidence of fat necrosis but without proximal obstruction. For 6 large esophageal hiatal hernia. 6. Aortic atherosclerosis. Electronically Signed   By: Burman Nieves M.D.   On: 09/12/2023 21:52   CT ABDOMEN PELVIS W CONTRAST  Result Date: 09/12/2023 CLINICAL DATA:  Pulmonary embolus suspected with high probability. Right upper quadrant abdominal pain starting yesterday. Right lower quadrant and right upper quadrant and right lower chest pain worse with deep breathing. Pain started yesterday. Patient was referred to ED to rule out cholecystitis. EXAM: CT ANGIOGRAPHY CHEST CT ABDOMEN AND PELVIS WITH CONTRAST TECHNIQUE: Multidetector CT imaging of the chest was performed using the standard protocol during bolus administration of intravenous contrast. Multiplanar CT image reconstructions and MIPs were obtained to evaluate the vascular anatomy. Multidetector CT imaging of the abdomen and pelvis was performed using the standard protocol during bolus administration of intravenous contrast. RADIATION DOSE REDUCTION: This exam was performed according to the departmental dose-optimization program which includes automated exposure control, adjustment of the mA and/or kV according to patient size and/or use of iterative reconstruction technique. CONTRAST:  OMNIPAQUE IOHEXOL 350 MG/ML SOLN COMPARISON:  CT chest 10/19/2021.  CT abdomen and pelvis 08/31/2021 FINDINGS: CTA CHEST FINDINGS Cardiovascular: There is good opacification of the central and segmental pulmonary arteries. No focal filling defects. No evidence of significant pulmonary embolus. Normal heart size. No pericardial effusions. Ascending thoracic aortic aneurysm measuring 5 cm diameter. Scattered aortic calcification. Calcification of the coronary arteries. Mediastinum/Nodes: Thyroid gland is unremarkable. Esophagus is decompressed. Large esophageal hiatal hernia. No significant lymphadenopathy. Left central venous catheter with Infusaport.  Lungs/Pleura: Lungs are clear. No pleural effusions. No pneumothorax. Musculoskeletal: Degenerative changes in the spine. No acute bony abnormalities. Review of the MIP images confirms the above findings. CT ABDOMEN and PELVIS FINDINGS Hepatobiliary: Multiple low-attenuation lesions demonstrated throughout the liver with peripheral enhancement in some lesions. These are likely metastatic. Similar appearance to previous study. Probable hepatic cirrhosis with enlarged lateral segment left lobe of the liver. Mild intrahepatic bile duct dilatation. Increased density in the distal common bile duct could represent common duct stone or sludge. Cholelithiasis with gallbladder wall thickening and pericholecystic edema possibly indicating cholecystitis. Pancreas: Unremarkable. No pancreatic ductal dilatation or surrounding inflammatory changes. Spleen: Normal in size without focal abnormality. Adrenals/Urinary Tract: No adrenal gland nodules. Kidneys are symmetrical. No hydronephrosis or hydroureter. No focal mass lesions. Gallbladder is unremarkable. Stomach/Bowel: Stomach, small bowel, and colon are not abnormally distended. No wall thickening or  inflammatory changes. Scattered stool in the colon. Appendix is normal. There is a large right inguinal hernia containing small bowel loops with fatty infiltration suggesting fat necrosis. No proximal obstruction or bowel wall thickening. Vascular/Lymphatic: Aortic atherosclerosis. No enlarged abdominal or pelvic lymph nodes. Reproductive: Prostate is unremarkable. Other: Small amount of free fluid in the upper abdomen, likely ascites. No free air. Abdominal wall musculature appears intact. Musculoskeletal: Degenerative changes in the spine. Old compression deformity of L2 without change. No focal bone lesions. Review of the MIP images confirms the above findings. IMPRESSION: 1. No evidence of significant pulmonary embolus. 2. 5 cm diameter ascending thoracic aortic aneurysm.  Recommend semi-annual imaging followup by CTA or MRA and referral to cardiothoracic surgery if not already obtained. This recommendation follows 2010 ACCF/AHA/AATS/ACR/ASA/SCA/SCAI/SIR/STS/SVM Guidelines for the Diagnosis and Management of Patients With Thoracic Aortic Disease. Circulation. 2010; 121: U272-Z366. Aortic aneurysm NOS (ICD10-I71.9) 3. Cholelithiasis with gallbladder wall thickening and edema, possibly acute cholecystitis. Intrahepatic bile duct dilatation with some suggestion of stone or sludge in the common bile duct. Consider follow-up with ERCP or MRCP as clinically indicated. 4. Hepatic cirrhosis with multiple focal liver lesions, likely metastatic. No change. Small amount of upper abdominal ascites. 5. Large right inguinal hernia containing small bowel with evidence of fat necrosis but without proximal obstruction. For 6 large esophageal hiatal hernia. 6. Aortic atherosclerosis. Electronically Signed   By: Burman Nieves M.D.   On: 09/12/2023 21:52   US Abdomen Limited RUQ (LIVER/GB)  Result Date: 09/12/2023 CLINICAL DATA:  Right upper quadrant pain history of liver Mets EXAM: ULTRASOUND ABDOMEN LIMITED RIGHT UPPER QUADRANT COMPARISON:  CT 01/04/2022 FINDINGS: Gallbladder: Shadowing stone. Slight wall thickening. Negative sonographic Murphy Common bile duct: Diameter: 3 mm Liver: Heterogenous hepatic echotexture and surface nodularity suggesting cirrhosis. Vague hypoechoic liver lesions corresponding to history of metastatic disease. Portal vein is patent on color Doppler imaging with normal direction of blood flow towards the liver. Other: None. IMPRESSION: 1. Cholelithiasis with slight gallbladder wall thickening but negative sonographic Murphy. 2. Heterogenous liver with surface nodularity suggesting cirrhosis or pseudocirrhosis. Vague hypoechoic liver lesions corresponding to history of metastatic disease. Electronically Signed   By: Jasmine Pang M.D.   On: 09/12/2023 20:52    Review  of Systems  Constitutional: Negative.   HENT: Negative.    Eyes: Negative.   Respiratory: Negative.    Cardiovascular: Negative.   Gastrointestinal:  Positive for abdominal pain.  Endocrine: Negative.    Blood pressure 100/68, pulse (!) 51, temperature 97.7 F (36.5 C), temperature source Oral, resp. rate 18, height 5\' 5"  (1.651 m), weight 69.9 kg, SpO2 98%. Physical Exam Constitutional:      General: He is not in acute distress.    Appearance: He is well-developed.     Comments: patient is hard of hearing  HENT:     Head: Normocephalic and atraumatic.  Cardiovascular:     Rate and Rhythm: Normal rate and regular rhythm.     Heart sounds: Normal heart sounds.     Comments: A pacemaker is palpable subcutaneously in the left chest Pulmonary:     Effort: Pulmonary effort is normal.     Breath sounds: Normal breath sounds.  Abdominal:     General: Abdomen is flat. Bowel sounds are normal.     Palpations: Abdomen is soft.     Tenderness: There is no abdominal tenderness.  Skin:    General: Skin is warm and dry.  Neurological:     General: No focal deficit  present.     Mental Status: He is alert.  Psychiatric:        Mood and Affect: Mood normal.        Behavior: Behavior normal.   Assessment/Plan: 1) Cholelithiasis patient was distal CBD stone with normal LFT's-I do not feel the patient needs ERCP at this time.  MRCP cannot be done as he has a pacemaker; as he is completely asymptomatic at this time I think it is fair to discharge him on a low-fat diet and have an EUS done if he has any recurrent symptoms or any elevations in his liver enzymes. I have discussed the plans at length with his daughter and his wife were at the bedside will discuss his case with Dr. Leone Payor so that outpatient follow-up can be arranged for him. Patient has been advised to follow a low-fat diet and I have spoken to the [facility] hospitalist to discharge the patient today. 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 3) HTN/hyperlipidemia. 4) AAA. 5) GERD-on Protonix. 6) History of asystole x 2's status post pacemaker placement initially in 2009 Gary [name] 09/13/2023, 11:52 AM

## 2023-09-13 NOTE — ED Notes (Signed)
[facility] here to transport patient to [facility].

## 2023-09-13 NOTE — Plan of Care (Signed)

## 2023-09-13 NOTE — Consult Note (Addendum)
Reason for Consult: Abdominal pain Referring Physician: Dr. Sammuel Cooper is an 89 y.o. male.  HPI: The patient is an 89 year old male who is admitted with right upper quadrant abdominal pain.  He has a history of metastatic pancreatic cancer diagnosed 3 years ago which has been stable.  On his CT scan he was noted to have evidence of cirrhosis and metastatic pancreatic cancer to the liver.  It was also noted that he may have stones or sludge in his common bile duct and possibly some cholecystitis.  Since coming to the hospital his pain has resolved.  Past Medical History:  Diagnosis Date   Ascending aortic aneurysm (HCC) 01/13/2014   stable at 4.7 cm diameter since 2009   Asystole x2, s/p pacemaker 01/13/2014   Bradycardia    permanent pacemaker 11/22/08 St.Jude   Family history of adverse reaction to anesthesia    brother died after anesthesia / nose polyp removal at [facility] (40 years ago)   HTN (hypertension) 01/13/2014   Hyperlipidemia    Hypertension    Malignant ascites    Pacemaker - leads 1987, last generator 2009 St. Jude dual-chamber 01/13/2014   Atrial lead model 433-01, ventricular lead model 431-01 implanted November 1987 Right ventricular lead dysfunction with low impedance and mediocre sensing but satisfactory pacing threshold   Pacemaker lead malfunction 01/13/2014   Chronic low impedance and poor sensing of the ventricular lead   Pancreatic cancer (HCC)    Metastatic to liver    Past Surgical History:  Procedure Laterality Date   IR IMAGING GUIDED PORT INSERTION  05/24/2020   IR PARACENTESIS  10/26/2020   IR PARACENTESIS  12/12/2020   IR PARACENTESIS  01/22/2021   IR PARACENTESIS  02/02/2021   IR PARACENTESIS  02/14/2021   IR PARACENTESIS  02/23/2021   IR PARACENTESIS  03/06/2021   IR PARACENTESIS  03/14/2021   IR PARACENTESIS  03/22/2021   IR PARACENTESIS  03/29/2021   IR PARACENTESIS  04/06/2021   IR PARACENTESIS  04/17/2021   IR PARACENTESIS  04/27/2021   IR  PARACENTESIS  05/09/2021   IR PARACENTESIS  05/22/2021   IR PARACENTESIS  09/07/2021   PERMANENT PACEMAKER GENERATOR CHANGE  11/22/08   ST. Jude   PERMANENT PACEMAKER INSERTION  03/04/97   ST. Jude   US ECHOCARDIOGRAPHY  05/16/11   AS mild to mod.,TR mild to mod., EF => 55%.    Family History  Problem Relation Age of Onset   Ovarian cancer Mother    Heart attack Father     Social History:  reports that he has never smoked. He has never used smokeless tobacco. He reports current alcohol use. He reports that he does not use drugs.  Allergies:  Allergies  Allergen Reactions   Amlodipine Other (See Comments)   Gentamycin [Gentamicin Sulfate]     "Inner ears problems after Gent infusion" , also "unsteady mobility"   Hydrocodone     Other reaction(s): unknown   Aspirin Other (See Comments)    High doses cause stomach upset    Medications: I have reviewed the patient's current medications.  Results for orders placed or performed during the hospital encounter of 09/12/23 (from the past 48 hour(s))  Lipase, blood     Status: None   Collection Time: 09/12/23  4:18 PM  Result Value Ref Range   Lipase 12 11 - 51 U/L    Comment: Performed at [facility], [address], [place], [place] [address]  Comprehensive metabolic  panel     Status: Abnormal   Collection Time: 09/12/23  4:18 PM  Result Value Ref Range   Sodium 136 135 - 145 mmol/L   Potassium 4.0 3.5 - 5.1 mmol/L   Chloride 104 98 - 111 mmol/L   CO2 21 (L) 22 - 32 mmol/L   Glucose, Bld 93 70 - 99 mg/dL    Comment: Glucose reference range applies only to samples taken after fasting for at least 8 hours.   BUN 27 (H) 8 - 23 mg/dL   Creatinine, Ser 4.09 0.61 - 1.24 mg/dL   Calcium 9.6 8.9 - 81.1 mg/dL   Total Protein 7.5 6.5 - 8.1 g/dL   Albumin 4.1 3.5 - 5.0 g/dL   AST 15 15 - 41 U/L   ALT 11 0 - 44 U/L   Alkaline Phosphatase 76 38 - 126 U/L   Total Bilirubin 1.1 0.3 - 1.2 mg/dL   GFR, Estimated >91 >47  mL/min    Comment: (NOTE) Calculated using the CKD-EPI Creatinine Equation (2021)    Anion gap 11 5 - 15    Comment: Performed at [facility], [address], [place], [place] [address]  CBC     Status: None   Collection Time: 09/12/23  4:18 PM  Result Value Ref Range   WBC 8.0 4.0 - 10.5 K/uL   RBC 4.74 4.22 - 5.81 MIL/uL   Hemoglobin 14.0 13.0 - 17.0 g/dL   HCT 21.3 08.6 - 57.8 %   MCV 87.3 80.0 - 100.0 fL   MCH 29.5 26.0 - 34.0 pg   MCHC 33.8 30.0 - 36.0 g/dL   RDW 46.9 62.9 - 52.8 %   Platelets 152 150 - 400 K/uL   nRBC 0.0 0.0 - 0.2 %    Comment: Performed at [facility], [address], [place], [place] [address]  Urinalysis, Routine w reflex microscopic -Urine, Clean Catch     Status: None   Collection Time: 09/12/23  8:41 PM  Result Value Ref Range   Color, Urine YELLOW YELLOW   APPearance CLEAR CLEAR   Specific Gravity, Urine 1.018 1.005 - 1.030   pH 5.0 5.0 - 8.0   Glucose, UA NEGATIVE NEGATIVE mg/dL   Hgb urine dipstick NEGATIVE NEGATIVE   Bilirubin Urine NEGATIVE NEGATIVE   Ketones, ur NEGATIVE NEGATIVE mg/dL   Protein, ur NEGATIVE NEGATIVE mg/dL   Nitrite NEGATIVE NEGATIVE   Leukocytes,Ua NEGATIVE NEGATIVE    Comment: Performed at [facility], [address] [place], [place] [address]  Lactic acid, plasma     Status: None   Collection Time: 09/12/23 11:10 PM  Result Value Ref Range   Lactic Acid, Venous 1.2 0.5 - 1.9 mmol/L    Comment: Performed at [facility], [address], [place], [place] [address]    CT Angio Chest PE W and/or Wo Contrast  Result Date: 09/12/2023 CLINICAL DATA:  Pulmonary embolus suspected with high probability. Right upper quadrant abdominal pain starting yesterday. Right lower quadrant and right upper quadrant and right lower chest pain worse with deep breathing. Pain started yesterday. Patient was referred to ED to rule out cholecystitis. EXAM: CT  ANGIOGRAPHY CHEST CT ABDOMEN AND PELVIS WITH CONTRAST TECHNIQUE: Multidetector CT imaging of the chest was performed using the standard protocol during bolus administration of intravenous contrast. Multiplanar CT image reconstructions and MIPs were obtained to evaluate the vascular anatomy. Multidetector CT imaging of the abdomen and pelvis was performed using the standard protocol during  bolus administration of intravenous contrast. RADIATION DOSE REDUCTION: This exam was performed according to the departmental dose-optimization program which includes automated exposure control, adjustment of the mA and/or kV according to patient size and/or use of iterative reconstruction technique. CONTRAST:  OMNIPAQUE IOHEXOL 350 MG/ML SOLN COMPARISON:  CT chest 10/19/2021.  CT abdomen and pelvis 08/31/2021 FINDINGS: CTA CHEST FINDINGS Cardiovascular: There is good opacification of the central and segmental pulmonary arteries. No focal filling defects. No evidence of significant pulmonary embolus. Normal heart size. No pericardial effusions. Ascending thoracic aortic aneurysm measuring 5 cm diameter. Scattered aortic calcification. Calcification of the coronary arteries. Mediastinum/Nodes: Thyroid gland is unremarkable. Esophagus is decompressed. Large esophageal hiatal hernia. No significant lymphadenopathy. Left central venous catheter with Infusaport. Lungs/Pleura: Lungs are clear. No pleural effusions. No pneumothorax. Musculoskeletal: Degenerative changes in the spine. No acute bony abnormalities. Review of the MIP images confirms the above findings. CT ABDOMEN and PELVIS FINDINGS Hepatobiliary: Multiple low-attenuation lesions demonstrated throughout the liver with peripheral enhancement in some lesions. These are likely metastatic. Similar appearance to previous study. Probable hepatic cirrhosis with enlarged lateral segment left lobe of the liver. Mild intrahepatic bile duct dilatation. Increased density in the  distal common bile duct could represent common duct stone or sludge. Cholelithiasis with gallbladder wall thickening and pericholecystic edema possibly indicating cholecystitis. Pancreas: Unremarkable. No pancreatic ductal dilatation or surrounding inflammatory changes. Spleen: Normal in size without focal abnormality. Adrenals/Urinary Tract: No adrenal gland nodules. Kidneys are symmetrical. No hydronephrosis or hydroureter. No focal mass lesions. Gallbladder is unremarkable. Stomach/Bowel: Stomach, small bowel, and colon are not abnormally distended. No wall thickening or inflammatory changes. Scattered stool in the colon. Appendix is normal. There is a large right inguinal hernia containing small bowel loops with fatty infiltration suggesting fat necrosis. No proximal obstruction or bowel wall thickening. Vascular/Lymphatic: Aortic atherosclerosis. No enlarged abdominal or pelvic lymph nodes. Reproductive: Prostate is unremarkable. Other: Small amount of free fluid in the upper abdomen, likely ascites. No free air. Abdominal wall musculature appears intact. Musculoskeletal: Degenerative changes in the spine. Old compression deformity of L2 without change. No focal bone lesions. Review of the MIP images confirms the above findings. IMPRESSION: 1. No evidence of significant pulmonary embolus. 2. 5 cm diameter ascending thoracic aortic aneurysm. Recommend semi-annual imaging followup by CTA or MRA and referral to cardiothoracic surgery if not already obtained. This recommendation follows 2010 ACCF/AHA/AATS/ACR/ASA/SCA/SCAI/SIR/STS/SVM Guidelines for the Diagnosis and Management of Patients With Thoracic Aortic Disease. Circulation. 2010; 121: J478-G956. Aortic aneurysm NOS (ICD10-I71.9) 3. Cholelithiasis with gallbladder wall thickening and edema, possibly acute cholecystitis. Intrahepatic bile duct dilatation with some suggestion of stone or sludge in the common bile duct. Consider follow-up with ERCP or MRCP as  clinically indicated. 4. Hepatic cirrhosis with multiple focal liver lesions, likely metastatic. No change. Small amount of upper abdominal ascites. 5. Large right inguinal hernia containing small bowel with evidence of fat necrosis but without proximal obstruction. For 6 large esophageal hiatal hernia. 6. Aortic atherosclerosis. Electronically Signed   By: Burman Nieves M.D.   On: 09/12/2023 21:52   CT ABDOMEN PELVIS W CONTRAST  Result Date: 09/12/2023 CLINICAL DATA:  Pulmonary embolus suspected with high probability. Right upper quadrant abdominal pain starting yesterday. Right lower quadrant and right upper quadrant and right lower chest pain worse with deep breathing. Pain started yesterday. Patient was referred to ED to rule out cholecystitis. EXAM: CT ANGIOGRAPHY CHEST CT ABDOMEN AND PELVIS WITH CONTRAST TECHNIQUE: Multidetector CT imaging of the chest was performed using  the standard protocol during bolus administration of intravenous contrast. Multiplanar CT image reconstructions and MIPs were obtained to evaluate the vascular anatomy. Multidetector CT imaging of the abdomen and pelvis was performed using the standard protocol during bolus administration of intravenous contrast. RADIATION DOSE REDUCTION: This exam was performed according to the departmental dose-optimization program which includes automated exposure control, adjustment of the mA and/or kV according to patient size and/or use of iterative reconstruction technique. CONTRAST:  OMNIPAQUE IOHEXOL 350 MG/ML SOLN COMPARISON:  CT chest 10/19/2021.  CT abdomen and pelvis 08/31/2021 FINDINGS: CTA CHEST FINDINGS Cardiovascular: There is good opacification of the central and segmental pulmonary arteries. No focal filling defects. No evidence of significant pulmonary embolus. Normal heart size. No pericardial effusions. Ascending thoracic aortic aneurysm measuring 5 cm diameter. Scattered aortic calcification. Calcification of the coronary  arteries. Mediastinum/Nodes: Thyroid gland is unremarkable. Esophagus is decompressed. Large esophageal hiatal hernia. No significant lymphadenopathy. Left central venous catheter with Infusaport. Lungs/Pleura: Lungs are clear. No pleural effusions. No pneumothorax. Musculoskeletal: Degenerative changes in the spine. No acute bony abnormalities. Review of the MIP images confirms the above findings. CT ABDOMEN and PELVIS FINDINGS Hepatobiliary: Multiple low-attenuation lesions demonstrated throughout the liver with peripheral enhancement in some lesions. These are likely metastatic. Similar appearance to previous study. Probable hepatic cirrhosis with enlarged lateral segment left lobe of the liver. Mild intrahepatic bile duct dilatation. Increased density in the distal common bile duct could represent common duct stone or sludge. Cholelithiasis with gallbladder wall thickening and pericholecystic edema possibly indicating cholecystitis. Pancreas: Unremarkable. No pancreatic ductal dilatation or surrounding inflammatory changes. Spleen: Normal in size without focal abnormality. Adrenals/Urinary Tract: No adrenal gland nodules. Kidneys are symmetrical. No hydronephrosis or hydroureter. No focal mass lesions. Gallbladder is unremarkable. Stomach/Bowel: Stomach, small bowel, and colon are not abnormally distended. No wall thickening or inflammatory changes. Scattered stool in the colon. Appendix is normal. There is a large right inguinal hernia containing small bowel loops with fatty infiltration suggesting fat necrosis. No proximal obstruction or bowel wall thickening. Vascular/Lymphatic: Aortic atherosclerosis. No enlarged abdominal or pelvic lymph nodes. Reproductive: Prostate is unremarkable. Other: Small amount of free fluid in the upper abdomen, likely ascites. No free air. Abdominal wall musculature appears intact. Musculoskeletal: Degenerative changes in the spine. Old compression deformity of L2 without change.  No focal bone lesions. Review of the MIP images confirms the above findings. IMPRESSION: 1. No evidence of significant pulmonary embolus. 2. 5 cm diameter ascending thoracic aortic aneurysm. Recommend semi-annual imaging followup by CTA or MRA and referral to cardiothoracic surgery if not already obtained. This recommendation follows 2010 ACCF/AHA/AATS/ACR/ASA/SCA/SCAI/SIR/STS/SVM Guidelines for the Diagnosis and Management of Patients With Thoracic Aortic Disease. Circulation. 2010; 121: Z610-R604. Aortic aneurysm NOS (ICD10-I71.9) 3. Cholelithiasis with gallbladder wall thickening and edema, possibly acute cholecystitis. Intrahepatic bile duct dilatation with some suggestion of stone or sludge in the common bile duct. Consider follow-up with ERCP or MRCP as clinically indicated. 4. Hepatic cirrhosis with multiple focal liver lesions, likely metastatic. No change. Small amount of upper abdominal ascites. 5. Large right inguinal hernia containing small bowel with evidence of fat necrosis but without proximal obstruction. For 6 large esophageal hiatal hernia. 6. Aortic atherosclerosis. Electronically Signed   By: Burman Nieves M.D.   On: 09/12/2023 21:52   US Abdomen Limited RUQ (LIVER/GB)  Result Date: 09/12/2023 CLINICAL DATA:  Right upper quadrant pain history of liver Mets EXAM: ULTRASOUND ABDOMEN LIMITED RIGHT UPPER QUADRANT COMPARISON:  CT 01/04/2022 FINDINGS: Gallbladder: Shadowing stone. Slight  wall thickening. Negative sonographic Murphy Common bile duct: Diameter: 3 mm Liver: Heterogenous hepatic echotexture and surface nodularity suggesting cirrhosis. Vague hypoechoic liver lesions corresponding to history of metastatic disease. Portal vein is patent on color Doppler imaging with normal direction of blood flow towards the liver. Other: None. IMPRESSION: 1. Cholelithiasis with slight gallbladder wall thickening but negative sonographic Murphy. 2. Heterogenous liver with surface nodularity suggesting  cirrhosis or pseudocirrhosis. Vague hypoechoic liver lesions corresponding to history of metastatic disease. Electronically Signed   By: Jasmine Pang M.D.   On: 09/12/2023 20:52    Review of Systems  Constitutional: Negative.   HENT: Negative.    Eyes: Negative.   Respiratory: Negative.    Cardiovascular: Negative.   Gastrointestinal:  Positive for abdominal pain.  Endocrine: Negative.   Genitourinary: Negative.   Musculoskeletal: Negative.   Skin: Negative.   Allergic/Immunologic: Negative.   Neurological: Negative.   Hematological: Negative.   Psychiatric/Behavioral: Negative.     Blood pressure (!) 151/67, pulse (!) 57, temperature 98.2 F (36.8 C), temperature source Oral, resp. rate 18, height 5\' 5"  (1.651 m), weight 69.9 kg, SpO2 99%. Physical Exam Vitals reviewed.  Constitutional:      General: He is not in acute distress.    Appearance: Normal appearance.  HENT:     Head: Normocephalic and atraumatic.     Right Ear: External ear normal.     Left Ear: External ear normal.     Nose: Nose normal.     Mouth/Throat:     Mouth: Mucous membranes are moist.     Pharynx: Oropharynx is clear.  Eyes:     General: No scleral icterus.    Extraocular Movements: Extraocular movements intact.     Conjunctiva/sclera: Conjunctivae normal.     Pupils: Pupils are equal, round, and reactive to light.  Cardiovascular:     Rate and Rhythm: Normal rate and regular rhythm.     Pulses: Normal pulses.     Heart sounds: Normal heart sounds.  Pulmonary:     Effort: Pulmonary effort is normal. No respiratory distress.     Breath sounds: Normal breath sounds.  Abdominal:     General: Abdomen is flat. Bowel sounds are normal.     Palpations: Abdomen is soft.     Comments: There is no current abdominal pain  Musculoskeletal:        General: No swelling or deformity. Normal range of motion.     Cervical back: Normal range of motion and neck supple.  Skin:    General: Skin is warm and dry.      Coloration: Skin is not jaundiced.  Neurological:     General: No focal deficit present.     Mental Status: He is alert and oriented to person, place, and time.  Psychiatric:        Mood and Affect: Mood normal.        Behavior: Behavior normal.     Assessment/Plan: The patient appears to have gallstones and possible cholecystitis that are intermittently causing him some symptoms as well as possible common bile duct stones.  At this point he likely cannot have an MRCP because of his pacemaker.  I would recommend consultation with GI to see if they would consider ERCP.  He is followed by Dr. Leone Payor.  Given his underlying metastatic pancreatic cancer and cirrhosis I think he is a very poor surgical candidate and he states that he was told by his oncologist not to ever have surgery if  possible and I would agree with this.  We will follow him closely with you.  Fortunately at this point his pain has resolved.  Chevis Pretty III 09/13/2023, 10:13 AM

## 2023-09-13 NOTE — TOC Initial Note (Addendum)
Transition of Care [facility]) - Initial/Assessment Note    Patient Details  Name: Gary [name] MRN: [id number] Date of Birth: 1932-01-15  [facility] CM/SW Contact:    Adrian Prows, RN Phone Number: 09/13/2023, 2:40 PM  Clinical Narrative:                 Sherron Monday w/ pt, Andy Gauss (wife) and dtr Almira Coaster in room; pt is from home and plans to return at d/c; he has transportation; pt denies SDOH risks; he has cane and walker; pt says he does PT via Zoom; there are grab bars in her shower; pt says he does not have home oxygen; no TOC needs.  Expected Discharge Plan: Home/Self Care Barriers to Discharge: No Barriers Identified   Patient Goals and CMS Choice Patient states their goals for this hospitalization and ongoing recovery are:: home          Expected Discharge Plan and Services   Discharge Planning Services: CM Consult   Living arrangements for the past 2 months: Single Family Home Expected Discharge Date: 09/13/23                                    Prior Living Arrangements/Services Living arrangements for the past 2 months: Single Family Home Lives with:: Spouse Patient language and need for interpreter reviewed:: Yes Do you feel safe going back to the place where you live?: Yes      Need for Family Participation in Patient Care: Yes (Comment) Care giver support system in place?: Yes (comment) Current home services: DME (cane, walker; home PT via Zoom) Criminal Activity/Legal Involvement Pertinent to Current Situation/Hospitalization: No - Comment as needed  Activities of Daily Living   ADL Screening (condition at time of admission) Does the patient have a NEW difficulty with bathing/dressing/toileting/self-feeding that is expected to last >3 days?: No Does the patient have a NEW difficulty with getting in/out of bed, walking, or climbing stairs that is expected to last >3 days?: No Does the patient have a NEW difficulty with communication that  is expected to last >3 days?: No Is the patient deaf or have difficulty hearing?: Yes Does the patient have difficulty seeing, even when wearing glasses/contacts?: No Does the patient have difficulty concentrating, remembering, or making decisions?: No  Permission Sought/Granted Permission sought to share information with : Case Manager Permission granted to share information with : Yes, Verbal Permission Granted  Share Information with NAME: Case Manager     Permission granted to share info w Relationship: Huxley Engman (spouse) [phone]     Emotional Assessment Appearance:: Appears stated age Attitude/Demeanor/Rapport: Gracious Affect (typically observed): Accepting Orientation: : Oriented to Self, Oriented to Place, Oriented to  Time, Oriented to Situation Alcohol / Substance Use: Not Applicable Psych Involvement: No (comment)  Admission diagnosis:  Acute cholecystitis [K81.0] Cholecystitis [K81.9] Right inguinal hernia [K40.90] Right sided abdominal pain [R10.9] Aneurysm of ascending aorta without rupture (HCC) [I71.21] Patient Active Problem List   Diagnosis Date Noted   Acute cholecystitis 09/13/2023   Other ascites 02/04/2023   Pancreatic cancer metastasized to liver (HCC) 03/06/2021   Blood pressure decreased 03/06/2021   Pruritus 03/06/2021   Genetic testing 06/23/2020   Port-A-Cath in place 06/12/2020   Goals of care, counseling/discussion 05/16/2020   Cancer of pancreas, tail (HCC) 05/16/2020   Coronary atherosclerosis 07/13/2014   Ascending aortic aneurysm (HCC) 01/13/2014   Asystole x2, s/p pacemaker  01/13/2014   Pacemaker - leads 1987, last generator 2009 St. Jude dual-chamber 01/13/2014   Pacemaker lead malfunction 01/13/2014   HTN (hypertension) 01/13/2014   Hyperlipidemia 01/13/2014   PCP:  Lupita Raider, MD Pharmacy:   [facility] [phone] - [place], [place] - [address] AT [facility] [address] [facility] [address] [place] [address] Phone: [phone] Fax: [phone]     Social Determinants of Health (SDOH) Social History: SDOH Screenings   Food Insecurity: No Food Insecurity (09/13/2023)  Housing: Low Risk  (09/13/2023)  Transportation Needs: No Transportation Needs (09/13/2023)  Utilities: Not At Risk (09/13/2023)  Financial Resource Strain: Low Risk  (05/13/2023)  Social Connections: Socially Integrated (05/13/2023)  Stress: Stress Concern Present (05/13/2023)  Tobacco Use: Low Risk  (09/12/2023)   SDOH Interventions: Food Insecurity Interventions: Intervention Not Indicated, Inpatient TOC Housing Interventions: Intervention Not Indicated, Inpatient TOC Transportation Interventions: Intervention Not Indicated, Inpatient TOC Utilities Interventions: Intervention Not Indicated, Inpatient TOC   Readmission Risk Interventions     No data to display

## 2023-09-13 NOTE — ED Notes (Signed)
Patient being transported to [facility] via [facility]. Patient has all personal belongings. Nurse called patient's son and left message about patient being transported at this time.

## 2023-09-13 NOTE — Care Management Obs Status (Signed)
MEDICARE OBSERVATION STATUS NOTIFICATION   Patient Details  Name: Gary [name] MRN: [id number] Date of Birth: 09-29-1932   Medicare Observation Status Notification Given:  Yes    Adrian Prows, RN 09/13/2023, 2:34 PM

## 2023-09-13 NOTE — Progress Notes (Signed)
Pharmacy Antibiotic Note  Gary [name] is a 89 y.o. male admitted on 09/12/2023 with acute cholecystitis.  Pharmacy has been consulted for Zosyn dosing.  Plan: Zosyn 3.375g IV q8h (4 hour infusion). Need for further dosage adjustment appears unlikely at present.   Will sign off at this time.  Please reconsult if a change in clinical status warrants re-evaluation of dosage.   Height: 5\' 5"  (165.1 cm) Weight: 69.9 kg (154 lb) IBW/kg (Calculated) : 61.5  Temp (24hrs), Avg:98.1 F (36.7 C), Min:97.7 F (36.5 C), Max:98.6 F (37 C)  Recent Labs  Lab 09/12/23 1618 09/12/23 2310  WBC 8.0  --   CREATININE 1.16  --   LATICACIDVEN  --  1.2    Estimated Creatinine Clearance: 37.6 mL/min (by C-G formula based on SCr of 1.16 mg/dL).    Allergies  Allergen Reactions   Amlodipine Other (See Comments)   Gentamycin [Gentamicin Sulfate]     "Inner ears problems after Gent infusion" , also "unsteady mobility"   Hydrocodone     Other reaction(s): unknown   Aspirin Other (See Comments)    High doses cause stomach upset    Thank you for allowing pharmacy to be a part of this patient's care.  Maryellen Pile, PharmD 09/13/2023 5:29 AM

## 2023-09-13 NOTE — H&P (Signed)
History and Physical  LANDY BULGER VWU:[id number] DOB: Feb 14, 1932 DOA: 09/12/2023  PCP: Lupita Raider, MD   Chief Complaint: Groin pain  HPI: Gary [name] is a 89 y.o. male with medical history significant for AAA, pacemaker, refractory malignant ascites due to metastatic pancreatic cancer being admitted to the hospital with right sided abdominal pain concern for acute cholecystitis.  Patient presented to urgent care and then [facility] due to right-sided abdominal pain that started yesterday.  This morning, patient tells me that his pain has been in his right groin, where he has a hernia.  States that it has been there for many years, and he is able to reduce it on his own.  His bowels have been moving like normal.  For the last couple of days, he has been having right sided abdominal pain, and has not been wanting to eat very much.  Denies any fevers, or nausea.  Says the right-sided pain happens when he moves around, or when he takes deep breaths.  Denies any pain in the right upper quadrant.  ED Course: On ER evaluation, vital signs are unremarkable other than some asymptomatic bradycardia.  Lab work including CBC CMP and lactic acid level are unremarkable.  He had imaging as noted below, including CT scan of the abdomen as well as right upper quadrant ultrasound.  Review of Systems: Please see HPI for pertinent positives and negatives. A complete 10 system review of systems are otherwise negative.  Past Medical History:  Diagnosis Date   Ascending aortic aneurysm (HCC) 01/13/2014   stable at 4.7 cm diameter since 2009   Asystole x2, s/p pacemaker 01/13/2014   Bradycardia    permanent pacemaker 11/22/08 [employer]   Family history of adverse reaction to anesthesia    brother died after anesthesia / nose polyp removal at [facility] (40 years ago)   HTN (hypertension) 01/13/2014   Hyperlipidemia    Hypertension    Malignant ascites    Pacemaker - leads 1987, last generator 2009 [employer]  dual-chamber 01/13/2014   Atrial lead model 433-01, ventricular lead model 431-01 implanted November 1987 Right ventricular lead dysfunction with low impedance and mediocre sensing but satisfactory pacing threshold   Pacemaker lead malfunction 01/13/2014   Chronic low impedance and poor sensing of the ventricular lead   Pancreatic cancer (HCC)    Metastatic to liver   Past Surgical History:  Procedure Laterality Date   IR IMAGING GUIDED PORT INSERTION  05/24/2020   IR PARACENTESIS  10/26/2020   IR PARACENTESIS  12/12/2020   IR PARACENTESIS  01/22/2021   IR PARACENTESIS  02/02/2021   IR PARACENTESIS  02/14/2021   IR PARACENTESIS  02/23/2021   IR PARACENTESIS  03/06/2021   IR PARACENTESIS  03/14/2021   IR PARACENTESIS  03/22/2021   IR PARACENTESIS  03/29/2021   IR PARACENTESIS  04/06/2021   IR PARACENTESIS  04/17/2021   IR PARACENTESIS  04/27/2021   IR PARACENTESIS  05/09/2021   IR PARACENTESIS  05/22/2021   IR PARACENTESIS  09/07/2021   PERMANENT PACEMAKER GENERATOR CHANGE  11/22/08   [employer]   PERMANENT PACEMAKER INSERTION  03/04/97   [employer]   US ECHOCARDIOGRAPHY  05/16/11   AS mild to mod.,TR mild to mod., EF => 55%.    Social History:  reports that he has never smoked. He has never used smokeless tobacco. He reports current alcohol use. He reports that he does not use drugs.   Allergies  Allergen Reactions   Amlodipine  Other (See Comments)   Gentamycin [Gentamicin Sulfate]     "Inner ears problems after Gent infusion" , also "unsteady mobility"   Hydrocodone     Other reaction(s): unknown   Aspirin Other (See Comments)    High doses cause stomach upset    Family History  Problem Relation Age of Onset   Ovarian cancer Mother    Heart attack Father      Prior to Admission medications   Medication Sig Start Date End Date Taking? Authorizing Provider  acetaminophen (TYLENOL) 325 MG tablet 1 tablet as needed    [provider]  furosemide (LASIX) 20 MG tablet Take 1 tablet (20  mg total) by mouth daily as needed. 12/11/22   Croitoru, Mihai, MD  lidocaine-prilocaine (EMLA) cream Apply 1 application topically as directed. Apply 1 hour prior to IV stick and cover with plastic wrap 01/22/22   Rana Snare, NP  omeprazole (PRILOSEC) 20 MG capsule Take 1 capsule by mouth daily. 01/22/21   [provider]  spironolactone (ALDACTONE) 25 MG tablet TAKE 1 TABLET(25 MG) BY MOUTH EVERY OTHER DAY 03/12/23   Iva Boop, MD    Physical Exam: BP (!) 151/67   Pulse (!) 57   Temp 98.2 F (36.8 C) (Oral)   Resp 18   Ht 5\' 5"  (1.651 m)   Wt 69.9 kg   SpO2 99%   BMI 25.63 kg/m   General:  Alert, oriented, calm, in no acute distress, very hard of hearing but good historian Eyes: EOMI, clear conjuctivae, white sclerea Neck: supple, no masses, trachea mildline  Cardiovascular: RRR, no murmurs or rubs, no peripheral edema, he has a right anterior chest pacemaker, as well as left anterior chest port Respiratory: clear to auscultation bilaterally, no wheezes, no crackles  Abdomen: soft, nontender, no right upper quadrant tenderness, nondistended, normal bowel tones heard  Skin: dry, no rashes  Musculoskeletal: no joint effusions, normal range of motion  Psychiatric: appropriate affect, normal speech  Neurologic: extraocular muscles intact, clear speech, moving all extremities with intact sensorium         Labs on Admission:  Basic Metabolic Panel: Recent Labs  Lab 09/12/23 1618  NA 136  K 4.0  CL 104  CO2 21*  GLUCOSE 93  BUN 27*  CREATININE 1.16  CALCIUM 9.6   Liver Function Tests: Recent Labs  Lab 09/12/23 1618  AST 15  ALT 11  ALKPHOS 76  BILITOT 1.1  PROT 7.5  ALBUMIN 4.1   Recent Labs  Lab 09/12/23 1618  LIPASE 12   No results for input(s): "AMMONIA" in the last 168 hours. CBC: Recent Labs  Lab 09/12/23 1618  WBC 8.0  HGB 14.0  HCT 41.4  MCV 87.3  PLT 152   Cardiac Enzymes: No results for input(s): "CKTOTAL", "CKMB", "CKMBINDEX",  "TROPONINI" in the last 168 hours.  BNP (last 3 results) No results for input(s): "BNP" in the last 8760 hours.  ProBNP (last 3 results) No results for input(s): "PROBNP" in the last 8760 hours.  CBG: No results for input(s): "GLUCAP" in the last 168 hours.  Radiological Exams on Admission: CT Angio Chest PE W and/or Wo Contrast  Result Date: 09/12/2023 CLINICAL DATA:  Pulmonary embolus suspected with high probability. Right upper quadrant abdominal pain starting yesterday. Right lower quadrant and right upper quadrant and right lower chest pain worse with deep breathing. Pain started yesterday. Patient was referred to ED to rule out cholecystitis. EXAM: CT ANGIOGRAPHY CHEST CT ABDOMEN AND  PELVIS WITH CONTRAST TECHNIQUE: Multidetector CT imaging of the chest was performed using the standard protocol during bolus administration of intravenous contrast. Multiplanar CT image reconstructions and MIPs were obtained to evaluate the vascular anatomy. Multidetector CT imaging of the abdomen and pelvis was performed using the standard protocol during bolus administration of intravenous contrast. RADIATION DOSE REDUCTION: This exam was performed according to the departmental dose-optimization program which includes automated exposure control, adjustment of the mA and/or kV according to patient size and/or use of iterative reconstruction technique. CONTRAST:  OMNIPAQUE IOHEXOL 350 MG/ML SOLN COMPARISON:  CT chest 10/19/2021.  CT abdomen and pelvis 08/31/2021 FINDINGS: CTA CHEST FINDINGS Cardiovascular: There is good opacification of the central and segmental pulmonary arteries. No focal filling defects. No evidence of significant pulmonary embolus. Normal heart size. No pericardial effusions. Ascending thoracic aortic aneurysm measuring 5 cm diameter. Scattered aortic calcification. Calcification of the coronary arteries. Mediastinum/Nodes: Thyroid gland is unremarkable. Esophagus is decompressed. Large  esophageal hiatal hernia. No significant lymphadenopathy. Left central venous catheter with Infusaport. Lungs/Pleura: Lungs are clear. No pleural effusions. No pneumothorax. Musculoskeletal: Degenerative changes in the spine. No acute bony abnormalities. Review of the MIP images confirms the above findings. CT ABDOMEN and PELVIS FINDINGS Hepatobiliary: Multiple low-attenuation lesions demonstrated throughout the liver with peripheral enhancement in some lesions. These are likely metastatic. Similar appearance to previous study. Probable hepatic cirrhosis with enlarged lateral segment left lobe of the liver. Mild intrahepatic bile duct dilatation. Increased density in the distal common bile duct could represent common duct stone or sludge. Cholelithiasis with gallbladder wall thickening and pericholecystic edema possibly indicating cholecystitis. Pancreas: Unremarkable. No pancreatic ductal dilatation or surrounding inflammatory changes. Spleen: Normal in size without focal abnormality. Adrenals/Urinary Tract: No adrenal gland nodules. Kidneys are symmetrical. No hydronephrosis or hydroureter. No focal mass lesions. Gallbladder is unremarkable. Stomach/Bowel: Stomach, small bowel, and colon are not abnormally distended. No wall thickening or inflammatory changes. Scattered stool in the colon. Appendix is normal. There is a large right inguinal hernia containing small bowel loops with fatty infiltration suggesting fat necrosis. No proximal obstruction or bowel wall thickening. Vascular/Lymphatic: Aortic atherosclerosis. No enlarged abdominal or pelvic lymph nodes. Reproductive: Prostate is unremarkable. Other: Small amount of free fluid in the upper abdomen, likely ascites. No free air. Abdominal wall musculature appears intact. Musculoskeletal: Degenerative changes in the spine. Old compression deformity of L2 without change. No focal bone lesions. Review of the MIP images confirms the above findings. IMPRESSION: 1.  No evidence of significant pulmonary embolus. 2. 5 cm diameter ascending thoracic aortic aneurysm. Recommend semi-annual imaging followup by CTA or MRA and referral to cardiothoracic surgery if not already obtained. This recommendation follows 2010 ACCF/AHA/AATS/ACR/ASA/SCA/SCAI/SIR/STS/SVM Guidelines for the Diagnosis and Management of Patients With Thoracic Aortic Disease. Circulation. 2010; 121: N829-F621. Aortic aneurysm NOS (ICD10-I71.9) 3. Cholelithiasis with gallbladder wall thickening and edema, possibly acute cholecystitis. Intrahepatic bile duct dilatation with some suggestion of stone or sludge in the common bile duct. Consider follow-up with ERCP or MRCP as clinically indicated. 4. Hepatic cirrhosis with multiple focal liver lesions, likely metastatic. No change. Small amount of upper abdominal ascites. 5. Large right inguinal hernia containing small bowel with evidence of fat necrosis but without proximal obstruction. For 6 large esophageal hiatal hernia. 6. Aortic atherosclerosis. Electronically Signed   By: Burman Nieves M.D.   On: 09/12/2023 21:52   CT ABDOMEN PELVIS W CONTRAST  Result Date: 09/12/2023 CLINICAL DATA:  Pulmonary embolus suspected with high probability. Right upper quadrant abdominal pain  starting yesterday. Right lower quadrant and right upper quadrant and right lower chest pain worse with deep breathing. Pain started yesterday. Patient was referred to ED to rule out cholecystitis. EXAM: CT ANGIOGRAPHY CHEST CT ABDOMEN AND PELVIS WITH CONTRAST TECHNIQUE: Multidetector CT imaging of the chest was performed using the standard protocol during bolus administration of intravenous contrast. Multiplanar CT image reconstructions and MIPs were obtained to evaluate the vascular anatomy. Multidetector CT imaging of the abdomen and pelvis was performed using the standard protocol during bolus administration of intravenous contrast. RADIATION DOSE REDUCTION: This exam was performed  according to the departmental dose-optimization program which includes automated exposure control, adjustment of the mA and/or kV according to patient size and/or use of iterative reconstruction technique. CONTRAST:  OMNIPAQUE IOHEXOL 350 MG/ML SOLN COMPARISON:  CT chest 10/19/2021.  CT abdomen and pelvis 08/31/2021 FINDINGS: CTA CHEST FINDINGS Cardiovascular: There is good opacification of the central and segmental pulmonary arteries. No focal filling defects. No evidence of significant pulmonary embolus. Normal heart size. No pericardial effusions. Ascending thoracic aortic aneurysm measuring 5 cm diameter. Scattered aortic calcification. Calcification of the coronary arteries. Mediastinum/Nodes: Thyroid gland is unremarkable. Esophagus is decompressed. Large esophageal hiatal hernia. No significant lymphadenopathy. Left central venous catheter with Infusaport. Lungs/Pleura: Lungs are clear. No pleural effusions. No pneumothorax. Musculoskeletal: Degenerative changes in the spine. No acute bony abnormalities. Review of the MIP images confirms the above findings. CT ABDOMEN and PELVIS FINDINGS Hepatobiliary: Multiple low-attenuation lesions demonstrated throughout the liver with peripheral enhancement in some lesions. These are likely metastatic. Similar appearance to previous study. Probable hepatic cirrhosis with enlarged lateral segment left lobe of the liver. Mild intrahepatic bile duct dilatation. Increased density in the distal common bile duct could represent common duct stone or sludge. Cholelithiasis with gallbladder wall thickening and pericholecystic edema possibly indicating cholecystitis. Pancreas: Unremarkable. No pancreatic ductal dilatation or surrounding inflammatory changes. Spleen: Normal in size without focal abnormality. Adrenals/Urinary Tract: No adrenal gland nodules. Kidneys are symmetrical. No hydronephrosis or hydroureter. No focal mass lesions. Gallbladder is unremarkable.  Stomach/Bowel: Stomach, small bowel, and colon are not abnormally distended. No wall thickening or inflammatory changes. Scattered stool in the colon. Appendix is normal. There is a large right inguinal hernia containing small bowel loops with fatty infiltration suggesting fat necrosis. No proximal obstruction or bowel wall thickening. Vascular/Lymphatic: Aortic atherosclerosis. No enlarged abdominal or pelvic lymph nodes. Reproductive: Prostate is unremarkable. Other: Small amount of free fluid in the upper abdomen, likely ascites. No free air. Abdominal wall musculature appears intact. Musculoskeletal: Degenerative changes in the spine. Old compression deformity of L2 without change. No focal bone lesions. Review of the MIP images confirms the above findings. IMPRESSION: 1. No evidence of significant pulmonary embolus. 2. 5 cm diameter ascending thoracic aortic aneurysm. Recommend semi-annual imaging followup by CTA or MRA and referral to cardiothoracic surgery if not already obtained. This recommendation follows 2010 ACCF/AHA/AATS/ACR/ASA/SCA/SCAI/SIR/STS/SVM Guidelines for the Diagnosis and Management of Patients With Thoracic Aortic Disease. Circulation. 2010; 121: M578-I696. Aortic aneurysm NOS (ICD10-I71.9) 3. Cholelithiasis with gallbladder wall thickening and edema, possibly acute cholecystitis. Intrahepatic bile duct dilatation with some suggestion of stone or sludge in the common bile duct. Consider follow-up with ERCP or MRCP as clinically indicated. 4. Hepatic cirrhosis with multiple focal liver lesions, likely metastatic. No change. Small amount of upper abdominal ascites. 5. Large right inguinal hernia containing small bowel with evidence of fat necrosis but without proximal obstruction. For 6 large esophageal hiatal hernia. 6. Aortic atherosclerosis. Electronically Signed  By: Burman Nieves M.D.   On: 09/12/2023 21:52   US Abdomen Limited RUQ (LIVER/GB)  Result Date: 09/12/2023 CLINICAL DATA:   Right upper quadrant pain history of liver Mets EXAM: ULTRASOUND ABDOMEN LIMITED RIGHT UPPER QUADRANT COMPARISON:  CT 01/04/2022 FINDINGS: Gallbladder: Shadowing stone. Slight wall thickening. Negative sonographic Murphy Common bile duct: Diameter: 3 mm Liver: Heterogenous hepatic echotexture and surface nodularity suggesting cirrhosis. Vague hypoechoic liver lesions corresponding to history of metastatic disease. Portal vein is patent on color Doppler imaging with normal direction of blood flow towards the liver. Other: None. IMPRESSION: 1. Cholelithiasis with slight gallbladder wall thickening but negative sonographic Murphy. 2. Heterogenous liver with surface nodularity suggesting cirrhosis or pseudocirrhosis. Vague hypoechoic liver lesions corresponding to history of metastatic disease. Electronically Signed   By: Jasmine Pang M.D.   On: 09/12/2023 20:52    Assessment/[place] ACHILLIES ALLEGRA is a 89 y.o. male with medical history significant for AAA, pacemaker, refractory malignant ascites due to metastatic pancreatic cancer being admitted to the hospital with right sided abdominal pain.  Right sided abdominal pain-unclear etiology, CT scan with gallbladder wall thickening, however patient without obvious Murphy sign, and most of his discomfort is in the groin.  He is without nausea/vomiting, fevers or chills. -Inpatient admission -Pain and nausea control as needed -Continue gentle IV fluids -Await general surgery evaluation, may benefit from ERCP (has pacemaker) -Continue Zosyn for now until surgical evaluation  GERD-Protonix  AAA-noted to be at 4.7 cm since 2009, CT today measures 5 cm in size -Recommend reimaging in 6 months  DVT prophylaxis: Lovenox     Code Status: Full Code  Consults called: General Surgery  Admission status: The appropriate patient status for this patient is INPATIENT. Inpatient status is judged to be reasonable and necessary in order to provide the required intensity  of service to ensure the patient's safety. The patient's presenting symptoms, physical exam findings, and initial radiographic and laboratory data in the context of their chronic comorbidities is felt to place them at high risk for further clinical deterioration. Furthermore, it is not anticipated that the patient will be medically stable for discharge from the hospital within 2 midnights of admission.    I certify that at the point of admission it is my clinical judgment that the patient will require inpatient hospital care spanning beyond 2 midnights from the point of admission due to high intensity of service, high risk for further deterioration and high frequency of surveillance required  Time spent: 46 minutes  Ilyssa Grennan Sharlette Dense MD [facility] Pager [phone]  If 7PM-7AM, please contact night-coverage [email or web address] Password [facility]  09/13/2023, 8:24 AM

## 2023-09-18 DIAGNOSIS — I712 Thoracic aortic aneurysm, without rupture, unspecified: Secondary | ICD-10-CM | POA: Diagnosis not present

## 2023-09-18 DIAGNOSIS — K8020 Calculus of gallbladder without cholecystitis without obstruction: Secondary | ICD-10-CM | POA: Diagnosis not present

## 2023-09-18 DIAGNOSIS — F321 Major depressive disorder, single episode, moderate: Secondary | ICD-10-CM | POA: Diagnosis not present

## 2023-09-27 ENCOUNTER — Other Ambulatory Visit: Payer: Self-pay | Admitting: Internal Medicine

## 2023-10-07 DIAGNOSIS — M19072 Primary osteoarthritis, left ankle and foot: Secondary | ICD-10-CM | POA: Diagnosis not present

## 2023-10-07 DIAGNOSIS — L602 Onychogryphosis: Secondary | ICD-10-CM | POA: Diagnosis not present

## 2023-10-07 DIAGNOSIS — M19071 Primary osteoarthritis, right ankle and foot: Secondary | ICD-10-CM | POA: Diagnosis not present

## 2023-10-07 DIAGNOSIS — I70203 Unspecified atherosclerosis of native arteries of extremities, bilateral legs: Secondary | ICD-10-CM | POA: Diagnosis not present

## 2023-10-07 DIAGNOSIS — M6281 Muscle weakness (generalized): Secondary | ICD-10-CM | POA: Diagnosis not present

## 2023-10-08 DIAGNOSIS — I712 Thoracic aortic aneurysm, without rupture, unspecified: Secondary | ICD-10-CM | POA: Diagnosis not present

## 2023-10-08 DIAGNOSIS — I495 Sick sinus syndrome: Secondary | ICD-10-CM | POA: Diagnosis not present

## 2023-10-08 DIAGNOSIS — Z0000 Encounter for general adult medical examination without abnormal findings: Secondary | ICD-10-CM | POA: Diagnosis not present

## 2023-10-08 DIAGNOSIS — K4090 Unilateral inguinal hernia, without obstruction or gangrene, not specified as recurrent: Secondary | ICD-10-CM | POA: Diagnosis not present

## 2023-10-08 DIAGNOSIS — I10 Essential (primary) hypertension: Secondary | ICD-10-CM | POA: Diagnosis not present

## 2023-10-08 DIAGNOSIS — C259 Malignant neoplasm of pancreas, unspecified: Secondary | ICD-10-CM | POA: Diagnosis not present

## 2023-10-08 DIAGNOSIS — E7800 Pure hypercholesterolemia, unspecified: Secondary | ICD-10-CM | POA: Diagnosis not present

## 2023-10-08 DIAGNOSIS — K8020 Calculus of gallbladder without cholecystitis without obstruction: Secondary | ICD-10-CM | POA: Diagnosis not present

## 2023-10-08 DIAGNOSIS — C787 Secondary malignant neoplasm of liver and intrahepatic bile duct: Secondary | ICD-10-CM | POA: Diagnosis not present

## 2023-10-08 DIAGNOSIS — F321 Major depressive disorder, single episode, moderate: Secondary | ICD-10-CM | POA: Diagnosis not present

## 2023-10-08 DIAGNOSIS — Z950 Presence of cardiac pacemaker: Secondary | ICD-10-CM | POA: Diagnosis not present

## 2023-10-21 ENCOUNTER — Inpatient Hospital Stay: Payer: Medicare Other | Attending: Oncology

## 2023-10-21 DIAGNOSIS — C252 Malignant neoplasm of tail of pancreas: Secondary | ICD-10-CM | POA: Diagnosis not present

## 2023-10-21 DIAGNOSIS — Z452 Encounter for adjustment and management of vascular access device: Secondary | ICD-10-CM | POA: Diagnosis not present

## 2023-10-21 DIAGNOSIS — Z95828 Presence of other vascular implants and grafts: Secondary | ICD-10-CM

## 2023-10-21 MED ORDER — SODIUM CHLORIDE 0.9% FLUSH
10.0000 mL | INTRAVENOUS | Status: DC | PRN
  Administered 2023-10-21: 10 mL

## 2023-10-21 MED ORDER — HEPARIN SOD (PORK) LOCK FLUSH 100 UNIT/ML IV SOLN
500.0000 [IU] | Freq: Once | INTRAVENOUS | Status: AC | PRN
  Administered 2023-10-21: 500 [IU]

## 2023-10-21 NOTE — Patient Instructions (Signed)

## 2023-11-05 ENCOUNTER — Encounter: Payer: Self-pay | Admitting: Oncology

## 2023-11-05 DIAGNOSIS — H01114 Allergic dermatitis of left upper eyelid: Secondary | ICD-10-CM | POA: Diagnosis not present

## 2023-11-05 DIAGNOSIS — H01111 Allergic dermatitis of right upper eyelid: Secondary | ICD-10-CM | POA: Diagnosis not present

## 2023-11-05 DIAGNOSIS — Z961 Presence of intraocular lens: Secondary | ICD-10-CM | POA: Diagnosis not present

## 2023-11-05 DIAGNOSIS — H5213 Myopia, bilateral: Secondary | ICD-10-CM | POA: Diagnosis not present

## 2023-11-05 NOTE — Telephone Encounter (Signed)
Telephone call  

## 2023-12-22 ENCOUNTER — Other Ambulatory Visit: Payer: Medicare Other

## 2023-12-22 ENCOUNTER — Inpatient Hospital Stay: Payer: Medicare Other

## 2023-12-22 ENCOUNTER — Inpatient Hospital Stay: Payer: Medicare Other | Attending: Oncology

## 2023-12-22 ENCOUNTER — Inpatient Hospital Stay (HOSPITAL_BASED_OUTPATIENT_CLINIC_OR_DEPARTMENT_OTHER): Payer: Medicare Other | Admitting: Oncology

## 2023-12-22 VITALS — BP 129/73 | HR 65 | Temp 98.2°F | Resp 18 | Ht 65.0 in | Wt 144.5 lb

## 2023-12-22 DIAGNOSIS — I7121 Aneurysm of the ascending aorta, without rupture: Secondary | ICD-10-CM | POA: Insufficient documentation

## 2023-12-22 DIAGNOSIS — C787 Secondary malignant neoplasm of liver and intrahepatic bile duct: Secondary | ICD-10-CM | POA: Insufficient documentation

## 2023-12-22 DIAGNOSIS — Z95828 Presence of other vascular implants and grafts: Secondary | ICD-10-CM

## 2023-12-22 DIAGNOSIS — E785 Hyperlipidemia, unspecified: Secondary | ICD-10-CM | POA: Diagnosis not present

## 2023-12-22 DIAGNOSIS — C252 Malignant neoplasm of tail of pancreas: Secondary | ICD-10-CM | POA: Diagnosis not present

## 2023-12-22 DIAGNOSIS — R634 Abnormal weight loss: Secondary | ICD-10-CM | POA: Insufficient documentation

## 2023-12-22 DIAGNOSIS — I10 Essential (primary) hypertension: Secondary | ICD-10-CM | POA: Diagnosis not present

## 2023-12-22 DIAGNOSIS — Z452 Encounter for adjustment and management of vascular access device: Secondary | ICD-10-CM | POA: Insufficient documentation

## 2023-12-22 LAB — BASIC METABOLIC PANEL - CANCER CENTER ONLY
Anion gap: 9 (ref 5–15)
BUN: 30 mg/dL — ABNORMAL HIGH (ref 8–23)
CO2: 25 mmol/L (ref 22–32)
Calcium: 9.6 mg/dL (ref 8.9–10.3)
Chloride: 108 mmol/L (ref 98–111)
Creatinine: 1.26 mg/dL — ABNORMAL HIGH (ref 0.61–1.24)
GFR, Estimated: 55 mL/min — ABNORMAL LOW (ref >60)
Glucose, Bld: 104 mg/dL — ABNORMAL HIGH (ref 70–99)
Potassium: 3.9 mmol/L (ref 3.5–5.1)
Sodium: 142 mmol/L (ref 135–145)

## 2023-12-22 MED ORDER — HEPARIN SOD (PORK) LOCK FLUSH 100 UNIT/ML IV SOLN
500.0000 [IU] | Freq: Once | INTRAVENOUS | Status: AC | PRN
  Administered 2023-12-22: 500 [IU]

## 2023-12-22 MED ORDER — PREDNISONE 10 MG PO TABS: 10.0000 mg | ORAL_TABLET | Freq: Every day | ORAL | 2 refills | Status: DC

## 2023-12-22 MED ORDER — SODIUM CHLORIDE 0.9% FLUSH
10.0000 mL | INTRAVENOUS | Status: DC | PRN
  Administered 2023-12-22: 10 mL

## 2023-12-22 NOTE — Patient Instructions (Signed)

## 2023-12-22 NOTE — Progress Notes (Signed)
 Gary [name]   Diagnosis: Pancreas cancer  INTERVAL HISTORY:   Gary [name] returns as scheduled.  He is here with his wife and daughter.  They report he has a poor appetite.  He eats a good breakfast every day but eats little the rest of the day.  He complains of a dry mouth.  No other complaint.  He is exercising.  He was admitted 09/12/2023 with abdominal pain.  A CT was concerning for cholecystitis.  The pain resolved and he was discharged home the next day.  Objective:  Vital signs in last 24 hours:  Blood pressure 129/73, pulse 65, temperature 98.2 F (36.8 C), resp. rate 18, height 5' 5 (1.651 m), weight 144 lb 8 oz (65.5 kg), SpO2 99%.    HEENT: No thrush or ulcers Resp: Lungs clear bilaterally Cardio: Regular rate and rhythm GI: No bony megaly, fullness in the right upper abdomen without a discrete liver edge, no apparent ascites, no mass, nontender, right inguinal hernia Vascular: No leg edema   Portacath/PICC-without erythema  Lab Results:  Lab Results  Component Value Date   WBC 8.0 09/12/2023   HGB 14.0 09/12/2023   HCT 41.4 09/12/2023   MCV 87.3 09/12/2023   PLT 152 09/12/2023   NEUTROABS 6.2 08/02/2021    CMP  Lab Results  Component Value Date   NA 136 09/12/2023   K 4.0 09/12/2023   CL 104 09/12/2023   CO2 21 (L) 09/12/2023   GLUCOSE 93 09/12/2023   BUN 27 (H) 09/12/2023   CREATININE 1.16 09/12/2023   CALCIUM 9.6 09/12/2023   PROT 7.5 09/12/2023   ALBUMIN  4.1 09/12/2023   AST 15 09/12/2023   ALT 11 09/12/2023   ALKPHOS 76 09/12/2023   BILITOT 1.1 09/12/2023   GFRNONAA >60 09/12/2023   GFRAA >60 09/05/2020    Lab Results  Component Value Date   RJW800 717 (H) 08/26/2023    Medications: I have reviewed the patient's current medications.   Assessment/Plan: Pancreas tail lesion, multiple liver lesions Chest CT 04/12/2020-ascending thoracic aortic aneurysm; hypovascular lesion tail of the pancreas, new  compared to the prior examination.  Multiple poorly defined hypovascular lesions scattered throughout the liver CT abdomen/pelvis 05/02/2020-hypoenhancing lesion of the pancreatic tail measuring approximately 2.4 x 1.9 cm; enlarged portacaval lymph nodes measuring up to 1.9 x 1.4 cm; numerous hypodense lesions throughout the liver, index lesion of the liver dome measuring 1.7 x 1.2 cm. Ultrasound-guided biopsy of a left liver lesion 05/10/2020-poorly differentiated carcinoma consistent with a pancreatobiliary primary Markedly elevated CA 19-9  Cycle 1 day 1 gemcitabine /Abraxane  05/26/2020 Cycle 1 day 15 gemcitabine /Abraxane  06/12/2020 Cycle 2 day 1 gemcitabine /Abraxane  07/04/2020 (chemotherapy doses reduced, antiemetic regimen adjusted) Cycle 2 day 15 Gemcitabine /Abraxane  07/18/2020 (continue same dose reduction, reduce post-treatment steroids due to insomnia) Cycle 3 day 1 gem/abraxane  08/02/20 Cycle 3 day 15 gem/Abraxane  08/15/2020 CTs abdomen/pelvis 08/25/2020-pancreas tail mass and portacaval lymphadenopathy decreased.  Liver metastases reported as increased, further review with no significant change in the majority of liver lesions and a decrease in the size of a few lesions, no new margin lesions Cycle 4-day 1 gemcitabine /Abraxane  09/05/2020 Paracentesis 09/26/2020-reactive mesothelial cells Paracentesis 10/09/2020-reactive mesothelial cells Cycle 5 gemcitabine /Abraxane  10/12/2020 Cycle 6 gemcitabine /Abraxane  11/01/2020 Cycle 7 gemcitabine /Abraxane  11/22/2020 Cycle 8 gemcitabine /Abraxane  12/13/2020 Cycle 9 gemcitabine /Abraxane  01/03/2021 CT abdomen/pelvis 01/29/2021-decrease in pancreas tail mass, decrease in size of multiple hepatic metastases, decreased size of hepatoduodenal ligament lymph nodes, progressive ascites Cycle 10 gemcitabine /Abraxane  01/31/2021 CT abdomen/pelvis 04/13/2021- generally stable  liver lesions, some have slightly decreased and others slightly increased compared to the CT  01/29/2021, pancreas mass not appreciated, large volume ascites CT abdomen/pelvis 08/02/2021-multiple liver lesions-some are unchanged, fluid density lesions are present-some larger-treated metastases?,  Increased small right pleural effusion, ascites, tail of pancreas lesion Not measurable CT abdomen/pelvis 08/31/2021-increased size of right inguinal hernia containing ascites, unchanged ascites and small right pleural effusion, stable liver lesions, no pancreas mass CT abdomen/pelvis 01/04/2022-unchanged hypoenhancing liver lesions; moderate volume ascites; primary pancreatic malignancy not clearly visualized; coarse nodular contour of liver; large hiatal hernia CTs 09/12/2023: Cholelithiasis with gallbladder wall thickening and edema, intrahepatic bile duct dilation, cirrhosis, no change in multifocal liver lesions, large right inguinal hernia     Thoracic aortic aneurysm Hypertension Hyperlipidemia History of bradycardia status post permanent pacemaker Mother had cervical cancer Pitting lower leg edema GERD, on omeprazole   Right upper back inflamed sebaceous cyst-culture from cyst drainage 10/23/2020-2 staph species, antibiotic changed to ciprofloxacin  10/27/2020, status post I&D procedure 11/17/2020 Refractory ascites-trial of spironolactone  03/23/2021; spironolactone  and Lasix  04/04/2021--improved 06/21/2021 Admission 09/12/2023 with abdominal pain         Disposition: Gary [name] has metastatic pancreas cancer.  He has been maintained off of specific therapy for the past 3 years.  I am concerned he is developing progressive disease based on the weight loss and anorexia.  We will follow-up on the CA 19-9 from today.  He will return for an office visit in 4 weeks.  He will begin a trial of prednisone  as an appetite stimulant.  We will refer him for a restaging CT if the CA 19-9 is significantly higher and he continues to lose weight.  Arley Hof, MD  12/22/2023  2:59 PM

## 2023-12-23 LAB — CANCER ANTIGEN 19-9: CA 19-9: 3139 U/mL — ABNORMAL HIGH (ref 0–35)

## 2023-12-24 ENCOUNTER — Telehealth: Payer: Self-pay | Admitting: *Deleted

## 2023-12-24 DIAGNOSIS — C252 Malignant neoplasm of tail of pancreas: Secondary | ICD-10-CM

## 2023-12-24 NOTE — Telephone Encounter (Signed)
 Called Gary [name] w/CA 19.9 results and need for CT scan. She understands and agrees. Requesting scan after 1 pm on a Monday or Tuesday. She requested I reach out to son, [name]. Called [name] and provided him  lab result and date/time of CT scan and lab that day. He will be called when scan review appointment can be determined. Scheduling message sent.

## 2023-12-30 ENCOUNTER — Telehealth: Payer: Self-pay | Admitting: *Deleted

## 2023-12-30 NOTE — Telephone Encounter (Signed)
 Called son, Ree Kida with appointments on 1/27: 1 pm lab/flush at [facility] and scan at 2 pm. Liquids only for 4 hours prior. F/U scan review 1/30 at 3:30 per Dr. Truett Perna. He will relay this to patient.

## 2024-01-12 ENCOUNTER — Telehealth: Payer: Self-pay

## 2024-01-12 ENCOUNTER — Inpatient Hospital Stay: Payer: Medicare Other

## 2024-01-12 ENCOUNTER — Ambulatory Visit (HOSPITAL_BASED_OUTPATIENT_CLINIC_OR_DEPARTMENT_OTHER): Payer: Medicare Other

## 2024-01-12 NOTE — Telephone Encounter (Signed)
Spoke with patients son Chanetta Marshall and  he asked to cancel appointments for his father for 01/12/24 for labs, port flush/lab, CT scan and scan review with Dr. Truett Perna. Stated his dad does not feel well and is really weak. Stated his dad passed out earlier this morning. Chanetta Marshall states they will reschedule these appointments when his dad is feeling better. 01/12/24

## 2024-01-14 ENCOUNTER — Telehealth: Payer: Self-pay | Admitting: *Deleted

## 2024-01-14 NOTE — Telephone Encounter (Signed)
Error

## 2024-01-14 NOTE — Telephone Encounter (Signed)
Mrs. Jiminez called back not happy with the current appointments on 2/5 for port/lab and then CT scan at 6 pm. He is not able to wait that long for the scan. Was able to speak w/[facility] radiology and scheduler and were able to move the lab/flush to 3:15 and scan arrival to 4:15 for 4:30 scan. She was satisfied with that.

## 2024-01-15 ENCOUNTER — Other Ambulatory Visit: Payer: Medicare Other | Admitting: Oncology

## 2024-01-21 ENCOUNTER — Other Ambulatory Visit: Payer: Medicare Other

## 2024-01-21 ENCOUNTER — Ambulatory Visit (HOSPITAL_BASED_OUTPATIENT_CLINIC_OR_DEPARTMENT_OTHER)
Admission: RE | Admit: 2024-01-21 | Discharge: 2024-01-21 | Disposition: A | Discharge status: Home / Self Care | Payer: Medicare Other | Source: Ambulatory Visit | Attending: Oncology | Admitting: Oncology

## 2024-01-21 ENCOUNTER — Inpatient Hospital Stay: Payer: Medicare Other

## 2024-01-21 ENCOUNTER — Inpatient Hospital Stay: Payer: Medicare Other | Attending: Oncology

## 2024-01-21 DIAGNOSIS — C259 Malignant neoplasm of pancreas, unspecified: Secondary | ICD-10-CM | POA: Diagnosis not present

## 2024-01-21 DIAGNOSIS — C252 Malignant neoplasm of tail of pancreas: Secondary | ICD-10-CM | POA: Insufficient documentation

## 2024-01-21 DIAGNOSIS — I10 Essential (primary) hypertension: Secondary | ICD-10-CM | POA: Insufficient documentation

## 2024-01-21 DIAGNOSIS — I7121 Aneurysm of the ascending aorta, without rupture: Secondary | ICD-10-CM | POA: Diagnosis not present

## 2024-01-21 DIAGNOSIS — E785 Hyperlipidemia, unspecified: Secondary | ICD-10-CM | POA: Insufficient documentation

## 2024-01-21 DIAGNOSIS — K219 Gastro-esophageal reflux disease without esophagitis: Secondary | ICD-10-CM | POA: Insufficient documentation

## 2024-01-21 DIAGNOSIS — C787 Secondary malignant neoplasm of liver and intrahepatic bile duct: Secondary | ICD-10-CM | POA: Diagnosis not present

## 2024-01-21 DIAGNOSIS — Z452 Encounter for adjustment and management of vascular access device: Secondary | ICD-10-CM | POA: Insufficient documentation

## 2024-01-21 DIAGNOSIS — I81 Portal vein thrombosis: Secondary | ICD-10-CM | POA: Diagnosis not present

## 2024-01-21 LAB — BASIC METABOLIC PANEL - CANCER CENTER ONLY
Anion gap: 8 (ref 5–15)
BUN: 14 mg/dL (ref 8–23)
CO2: 26 mmol/L (ref 22–32)
Calcium: 10 mg/dL (ref 8.9–10.3)
Chloride: 101 mmol/L (ref 98–111)
Creatinine: 1.02 mg/dL (ref 0.61–1.24)
GFR, Estimated: >60 mL/min (ref >60)
Glucose, Bld: 107 mg/dL — ABNORMAL HIGH (ref 70–99)
Potassium: 4 mmol/L (ref 3.5–5.1)
Sodium: 135 mmol/L (ref 135–145)

## 2024-01-21 MED ORDER — IOHEXOL 300 MG/ML  SOLN
100.0000 mL | Freq: Once | INTRAMUSCULAR | Status: AC | PRN
  Administered 2024-01-21: 80 mL via INTRAVENOUS

## 2024-01-21 MED ORDER — HEPARIN SOD (PORK) LOCK FLUSH 100 UNIT/ML IV SOLN
500.0000 [IU] | Freq: Once | INTRAVENOUS | Status: AC
  Administered 2024-01-21: 500 [IU] via INTRAVENOUS

## 2024-01-21 NOTE — Patient Instructions (Signed)

## 2024-01-22 ENCOUNTER — Telehealth: Payer: Self-pay | Admitting: *Deleted

## 2024-01-22 MED ORDER — ONDANSETRON 8 MG PO TBDP: 8.0000 mg | ORAL_TABLET | Freq: Three times a day (TID) | ORAL | 0 refills | Status: DC | PRN

## 2024-01-22 NOTE — Telephone Encounter (Signed)
 Call from daughter, Dagoberto that patient woke up at 2 pm today (his usual wake up time) and felt nauseated. Has only had few sips of fluid with his medication when he got up. Tried to vomit, but could not. No diarrhea or constipation and no fever. Having some abdominal pain. No one has had any similar symptoms in his family. Informed daughter it could be related to the oral contrast from his CT scan yesterday. Suggested they give him Mylanta/Maalox and have him walk around some to see if this helps. Will send in script for few nausea pills.  If he develops fever, severe abdominal pain, vomiting that persists, he needs to go to ER. Called reading room and requested a read on his CT scan asap. Daughter will be called tomorrow with results.

## 2024-01-23 ENCOUNTER — Other Ambulatory Visit: Payer: Medicare Other

## 2024-01-23 ENCOUNTER — Telehealth: Payer: Self-pay | Admitting: *Deleted

## 2024-01-23 ENCOUNTER — Ambulatory Visit: Payer: Medicare Other | Admitting: Oncology

## 2024-01-23 LAB — CANCER ANTIGEN 19-9: CA 19-9: 4821 U/mL — ABNORMAL HIGH (ref 0–35)

## 2024-01-23 NOTE — Telephone Encounter (Signed)
 Daughter reports Gary [name] is still weak and sleeping all the time. Abdomen feels better today. Informed her that the CT scan does show progression of his cancer and MD will discuss with him on Tuesday visit.

## 2024-01-27 ENCOUNTER — Other Ambulatory Visit: Payer: Medicare Other

## 2024-01-27 ENCOUNTER — Inpatient Hospital Stay (HOSPITAL_BASED_OUTPATIENT_CLINIC_OR_DEPARTMENT_OTHER): Payer: Medicare Other | Admitting: Oncology

## 2024-01-27 VITALS — BP 124/87 | HR 90 | Temp 98.1°F | Resp 18 | Ht 65.0 in | Wt 140.0 lb

## 2024-01-27 DIAGNOSIS — I10 Essential (primary) hypertension: Secondary | ICD-10-CM | POA: Diagnosis not present

## 2024-01-27 DIAGNOSIS — C252 Malignant neoplasm of tail of pancreas: Secondary | ICD-10-CM | POA: Diagnosis not present

## 2024-01-27 DIAGNOSIS — C787 Secondary malignant neoplasm of liver and intrahepatic bile duct: Secondary | ICD-10-CM | POA: Diagnosis not present

## 2024-01-27 DIAGNOSIS — I7121 Aneurysm of the ascending aorta, without rupture: Secondary | ICD-10-CM | POA: Diagnosis not present

## 2024-01-27 DIAGNOSIS — E785 Hyperlipidemia, unspecified: Secondary | ICD-10-CM | POA: Diagnosis not present

## 2024-01-27 DIAGNOSIS — K219 Gastro-esophageal reflux disease without esophagitis: Secondary | ICD-10-CM | POA: Diagnosis not present

## 2024-01-27 NOTE — Progress Notes (Signed)
[facility] OFFICE PROGRESS NOTE   Diagnosis: Pancreas cancer  INTERVAL HISTORY:   Mr. Beske returns as scheduled.  He was here with his son.  They report his energy level has declined over the past 1-2 months.  He has a poor appetite.  He took prednisone for 1 day and did not like the way this made him feel.  He had an episode of abdominal pain that improved after taking an increased dose of Prilosec.  No consistent pain or nausea.   Objective:  Vital signs in last 24 hours:  Blood pressure 124/87, pulse 90, temperature 98.1 F (36.7 C), temperature source Temporal, resp. rate 18, height 5\' 5"  (1.651 m), weight 140 lb (63.5 kg), SpO2 100%.    Resp: Lungs clear bilaterally Cardio: Regular rate and rhythm GI: No hepatosplenomegaly, nontender, no apparent ascites, right inguinal hernia that extends into the right scrotum-soft and reducible Vascular: No leg edema   Portacath/PICC-without erythema  Lab Results:  Lab Results  Component Value Date   WBC 8.0 09/12/2023   HGB 14.0 09/12/2023   HCT 41.4 09/12/2023   MCV 87.3 09/12/2023   PLT 152 09/12/2023   NEUTROABS 6.2 08/02/2021    CMP  Lab Results  Component Value Date   NA 135 01/21/2024   K 4.0 01/21/2024   CL 101 01/21/2024   CO2 26 01/21/2024   GLUCOSE 107 (H) 01/21/2024   BUN 14 01/21/2024   CREATININE 1.02 01/21/2024   CALCIUM 10.0 01/21/2024   PROT 7.5 09/12/2023   ALBUMIN 4.1 09/12/2023   AST 15 09/12/2023   ALT 11 09/12/2023   ALKPHOS 76 09/12/2023   BILITOT 1.1 09/12/2023   GFRNONAA >60 01/21/2024   GFRAA >60 09/05/2020    Lab Results  Component Value Date   ZOX096 4,821 (H) 01/21/2024    Lab Results  Component Value Date   INR 1.2 (H) 05/01/2021   LABPROT 12.9 05/01/2021    Imaging:  No results found.  Medications: I have reviewed the patient's current medications.   Assessment/Plan: Pancreas tail lesion, multiple liver lesions Chest CT 04/12/2020-ascending  thoracic aortic aneurysm; hypovascular lesion tail of the pancreas, new compared to the prior examination.  Multiple poorly defined hypovascular lesions scattered throughout the liver CT abdomen/pelvis 05/02/2020-hypoenhancing lesion of the pancreatic tail measuring approximately 2.4 x 1.9 cm; enlarged portacaval lymph nodes measuring up to 1.9 x 1.4 cm; numerous hypodense lesions throughout the liver, index lesion of the liver dome measuring 1.7 x 1.2 cm. Ultrasound-guided biopsy of a left liver lesion 05/10/2020-poorly differentiated carcinoma consistent with a pancreatobiliary primary Markedly elevated CA 19-9  Cycle 1 day 1 gemcitabine/Abraxane 05/26/2020 Cycle 1 day 15 gemcitabine/Abraxane 06/12/2020 Cycle 2 day 1 gemcitabine/Abraxane 07/04/2020 (chemotherapy doses reduced, antiemetic regimen adjusted) Cycle 2 day 15 Gemcitabine/Abraxane 07/18/2020 (continue same dose reduction, reduce post-treatment steroids due to insomnia) Cycle 3 day 1 gem/abraxane 08/02/20 Cycle 3 day 15 gem/Abraxane 08/15/2020 CTs abdomen/pelvis 08/25/2020-pancreas tail mass and portacaval lymphadenopathy decreased.  Liver metastases reported as increased, further review with no significant change in the majority of liver lesions and a decrease in the size of a few lesions, no new margin lesions Cycle 4-day 1 gemcitabine/Abraxane 09/05/2020 Paracentesis 09/26/2020-reactive mesothelial cells Paracentesis 10/09/2020-reactive mesothelial cells Cycle 5 gemcitabine/Abraxane 10/12/2020 Cycle 6 gemcitabine/Abraxane 11/01/2020 Cycle 7 gemcitabine/Abraxane 11/22/2020 Cycle 8 gemcitabine/Abraxane 12/13/2020 Cycle 9 gemcitabine/Abraxane 01/03/2021 CT abdomen/pelvis 01/29/2021-decrease in pancreas tail mass, decrease in size of multiple hepatic metastases, decreased size of hepatoduodenal ligament lymph nodes, progressive ascites Cycle 10  gemcitabine/Abraxane 01/31/2021 CT abdomen/pelvis 04/13/2021- generally stable liver lesions, some have  slightly decreased and others slightly increased compared to the CT 01/29/2021, pancreas mass not appreciated, large volume ascites CT abdomen/pelvis 08/02/2021-multiple liver lesions-some are unchanged, fluid density lesions are present-some larger-treated metastases?,  Increased small right pleural effusion, ascites, tail of pancreas lesion Not measurable CT abdomen/pelvis 08/31/2021-increased size of right inguinal hernia containing ascites, unchanged ascites and small right pleural effusion, stable liver lesions, no pancreas mass CT abdomen/pelvis 01/04/2022-unchanged hypoenhancing liver lesions; moderate volume ascites; primary pancreatic malignancy not clearly visualized; coarse nodular contour of liver; large hiatal hernia CTs 09/12/2023: Cholelithiasis with gallbladder wall thickening and edema, intrahepatic bile duct dilation, cirrhosis, no change in multifocal liver lesions, large right inguinal hernia CTs 01/21/2024: New bilateral pulmonary nodules, increased size of hepatic metastases, increased abdominal lymphadenopathy, pancreas tail mass, new right posterior branch portal vein thrombosis     Thoracic aortic aneurysm Hypertension Hyperlipidemia History of bradycardia status post permanent pacemaker Mother had cervical cancer Pitting lower leg edema GERD, on omeprazole  Right upper back inflamed sebaceous cyst-culture from cyst drainage 10/23/2020-2 staph species, antibiotic changed to ciprofloxacin 10/27/2020, status post I&D procedure 11/17/2020 Refractory ascites-trial of spironolactone 03/23/2021; spironolactone and Lasix 04/04/2021--improved 06/21/2021 Admission 09/12/2023 with abdominal pain       Disposition: Mr. Sires diagnosed with metastatic pancreas cancer in 2021.  He experienced significant improvement with gemcitabine/Abraxane chemotherapy, last given in February 2022.  There is now clinical and radiologic evidence of disease progression.  The CA 19-9 is higher.  Mr. Mallari has  a poor performance status, ECOG 3. I discussed the restaging CT findings and reviewed the images with Mr. Agustin and his family.  We discussed treatment options.  We specifically discussed hospice care versus repeat treatment with gemcitabine/Abraxane.  He understands no therapy will be curative.  The goals of treatment are to improve symptoms and extend survival.  He understands there is not a high chance of a clinical response with repeat chemotherapy.  However he had a significant response with the initial gemcitabine/Abraxane and he has experienced a long off treatment interval.  We discussed the increased chance of toxicity with chemotherapy in patients with a poor performance status.  We discussed CPR and ACLS.  Mr. Lachapelle is undecided on CODE STATUS.  He would like to discuss options with his family for the next few days.  He will be in contact with [place] by 01/30/2024 with his decision on enrolling in hospice care versus chemotherapy.  I recommended he resume the trial of prednisone as an appetite stimulant.  Thornton Papas, MD  01/27/2024  3:43 PM

## 2024-01-28 ENCOUNTER — Other Ambulatory Visit: Payer: Self-pay | Admitting: *Deleted

## 2024-01-28 DIAGNOSIS — C252 Malignant neoplasm of tail of pancreas: Secondary | ICD-10-CM

## 2024-01-28 NOTE — Progress Notes (Signed)
Per MD request: Placed CSW referral for goals of care

## 2024-01-30 ENCOUNTER — Telehealth: Payer: Self-pay

## 2024-01-30 NOTE — Telephone Encounter (Signed)
[facility] Clinical Social Work  Clinical Social Work was referred by medical provider for assessment of psychosocial needs (Goals of Care).  Clinical Social Worker contacted caregiver by phone to offer support and assess for needs. CSW Provided education on Goals of Care and Advance Directives. Patient and family are still deciding on next steps for treatment plan. CSW provided additional information on how Goals of Care can be used to guide next step decisions. Patient requested CSW called son Chanetta Marshall) and relay the same information to him. CSW left vm for patient's son, as requested. CSW will follow up again next week.  Marguerita Merles, LCSW  Clinical Social Worker [facility]

## 2024-02-05 ENCOUNTER — Telehealth: Payer: Self-pay | Admitting: *Deleted

## 2024-02-05 DIAGNOSIS — C252 Malignant neoplasm of tail of pancreas: Secondary | ICD-10-CM

## 2024-02-05 NOTE — Telephone Encounter (Signed)
Son, Gary [name] called to report Gary [name] and family have decided home hospice is what he needs. Notified Dr. Truett Perna and referral placed. Informed son that Hospice will call them to arrange and intake visit-he would like to be the contact person for this.

## 2024-02-06 DIAGNOSIS — R32 Unspecified urinary incontinence: Secondary | ICD-10-CM | POA: Diagnosis not present

## 2024-02-06 DIAGNOSIS — R634 Abnormal weight loss: Secondary | ICD-10-CM | POA: Diagnosis not present

## 2024-02-06 DIAGNOSIS — C258 Malignant neoplasm of overlapping sites of pancreas: Secondary | ICD-10-CM | POA: Diagnosis not present

## 2024-02-06 DIAGNOSIS — E785 Hyperlipidemia, unspecified: Secondary | ICD-10-CM | POA: Diagnosis not present

## 2024-02-06 DIAGNOSIS — I10 Essential (primary) hypertension: Secondary | ICD-10-CM | POA: Diagnosis not present

## 2024-02-06 DIAGNOSIS — R1310 Dysphagia, unspecified: Secondary | ICD-10-CM | POA: Diagnosis not present

## 2024-02-07 DIAGNOSIS — I10 Essential (primary) hypertension: Secondary | ICD-10-CM | POA: Diagnosis not present

## 2024-02-07 DIAGNOSIS — E785 Hyperlipidemia, unspecified: Secondary | ICD-10-CM | POA: Diagnosis not present

## 2024-02-07 DIAGNOSIS — R1310 Dysphagia, unspecified: Secondary | ICD-10-CM | POA: Diagnosis not present

## 2024-02-07 DIAGNOSIS — R634 Abnormal weight loss: Secondary | ICD-10-CM | POA: Diagnosis not present

## 2024-02-07 DIAGNOSIS — R32 Unspecified urinary incontinence: Secondary | ICD-10-CM | POA: Diagnosis not present

## 2024-02-07 DIAGNOSIS — C258 Malignant neoplasm of overlapping sites of pancreas: Secondary | ICD-10-CM | POA: Diagnosis not present

## 2024-02-09 DIAGNOSIS — R1310 Dysphagia, unspecified: Secondary | ICD-10-CM | POA: Diagnosis not present

## 2024-02-09 DIAGNOSIS — R634 Abnormal weight loss: Secondary | ICD-10-CM | POA: Diagnosis not present

## 2024-02-09 DIAGNOSIS — R32 Unspecified urinary incontinence: Secondary | ICD-10-CM | POA: Diagnosis not present

## 2024-02-09 DIAGNOSIS — I10 Essential (primary) hypertension: Secondary | ICD-10-CM | POA: Diagnosis not present

## 2024-02-09 DIAGNOSIS — E785 Hyperlipidemia, unspecified: Secondary | ICD-10-CM | POA: Diagnosis not present

## 2024-02-09 DIAGNOSIS — C258 Malignant neoplasm of overlapping sites of pancreas: Secondary | ICD-10-CM | POA: Diagnosis not present

## 2024-02-10 DIAGNOSIS — I10 Essential (primary) hypertension: Secondary | ICD-10-CM | POA: Diagnosis not present

## 2024-02-10 DIAGNOSIS — R1310 Dysphagia, unspecified: Secondary | ICD-10-CM | POA: Diagnosis not present

## 2024-02-10 DIAGNOSIS — C258 Malignant neoplasm of overlapping sites of pancreas: Secondary | ICD-10-CM | POA: Diagnosis not present

## 2024-02-10 DIAGNOSIS — E785 Hyperlipidemia, unspecified: Secondary | ICD-10-CM | POA: Diagnosis not present

## 2024-02-10 DIAGNOSIS — R634 Abnormal weight loss: Secondary | ICD-10-CM | POA: Diagnosis not present

## 2024-02-10 DIAGNOSIS — R32 Unspecified urinary incontinence: Secondary | ICD-10-CM | POA: Diagnosis not present

## 2024-02-11 ENCOUNTER — Telehealth: Payer: Self-pay | Admitting: *Deleted

## 2024-02-11 DIAGNOSIS — R1310 Dysphagia, unspecified: Secondary | ICD-10-CM | POA: Diagnosis not present

## 2024-02-11 DIAGNOSIS — R634 Abnormal weight loss: Secondary | ICD-10-CM | POA: Diagnosis not present

## 2024-02-11 DIAGNOSIS — R32 Unspecified urinary incontinence: Secondary | ICD-10-CM | POA: Diagnosis not present

## 2024-02-11 DIAGNOSIS — C258 Malignant neoplasm of overlapping sites of pancreas: Secondary | ICD-10-CM | POA: Diagnosis not present

## 2024-02-11 DIAGNOSIS — E785 Hyperlipidemia, unspecified: Secondary | ICD-10-CM | POA: Diagnosis not present

## 2024-02-11 DIAGNOSIS — I10 Essential (primary) hypertension: Secondary | ICD-10-CM | POA: Diagnosis not present

## 2024-02-11 NOTE — Telephone Encounter (Signed)
 Daughter, Darel Hong called to report a decline in status. He can barely sit up and needs maximum assistance to move in bed. They are at a loss on what to do. Informed her they could call for personal care assistant to stay with him, but that is out-of-pocket or consider [facility]. Called his nurse, Arlys John w/Hospice and he will see family on Friday. York Spaniel that CSW provided a list of agencies. Daughter reports the list was sent to son, Ree Kida but he could not open the attachment. She will call Hospice herself today and ask for nurse.

## 2024-02-12 DIAGNOSIS — C258 Malignant neoplasm of overlapping sites of pancreas: Secondary | ICD-10-CM | POA: Diagnosis not present

## 2024-02-12 DIAGNOSIS — E785 Hyperlipidemia, unspecified: Secondary | ICD-10-CM | POA: Diagnosis not present

## 2024-02-12 DIAGNOSIS — I10 Essential (primary) hypertension: Secondary | ICD-10-CM | POA: Diagnosis not present

## 2024-02-12 DIAGNOSIS — R634 Abnormal weight loss: Secondary | ICD-10-CM | POA: Diagnosis not present

## 2024-02-12 DIAGNOSIS — R1310 Dysphagia, unspecified: Secondary | ICD-10-CM | POA: Diagnosis not present

## 2024-02-12 DIAGNOSIS — R32 Unspecified urinary incontinence: Secondary | ICD-10-CM | POA: Diagnosis not present

## 2024-02-13 DIAGNOSIS — R1310 Dysphagia, unspecified: Secondary | ICD-10-CM | POA: Diagnosis not present

## 2024-02-13 DIAGNOSIS — R32 Unspecified urinary incontinence: Secondary | ICD-10-CM | POA: Diagnosis not present

## 2024-02-13 DIAGNOSIS — R634 Abnormal weight loss: Secondary | ICD-10-CM | POA: Diagnosis not present

## 2024-02-13 DIAGNOSIS — C258 Malignant neoplasm of overlapping sites of pancreas: Secondary | ICD-10-CM | POA: Diagnosis not present

## 2024-02-13 DIAGNOSIS — I10 Essential (primary) hypertension: Secondary | ICD-10-CM | POA: Diagnosis not present

## 2024-02-13 DIAGNOSIS — E785 Hyperlipidemia, unspecified: Secondary | ICD-10-CM | POA: Diagnosis not present

## 2024-02-14 DIAGNOSIS — C258 Malignant neoplasm of overlapping sites of pancreas: Secondary | ICD-10-CM | POA: Diagnosis not present

## 2024-02-14 DIAGNOSIS — R634 Abnormal weight loss: Secondary | ICD-10-CM | POA: Diagnosis not present

## 2024-02-14 DIAGNOSIS — R32 Unspecified urinary incontinence: Secondary | ICD-10-CM | POA: Diagnosis not present

## 2024-02-14 DIAGNOSIS — E785 Hyperlipidemia, unspecified: Secondary | ICD-10-CM | POA: Diagnosis not present

## 2024-02-14 DIAGNOSIS — I10 Essential (primary) hypertension: Secondary | ICD-10-CM | POA: Diagnosis not present

## 2024-02-14 DIAGNOSIS — R1310 Dysphagia, unspecified: Secondary | ICD-10-CM | POA: Diagnosis not present

## 2024-03-16 DEATH — deceased
